# Patient Record
Sex: Female | Born: 1935
Health system: Southern US, Community
[De-identification: ages and names within clinical notes are randomized; demographics above are authoritative.]

## PROBLEM LIST (undated history)

## (undated) DIAGNOSIS — M8580 Other specified disorders of bone density and structure, unspecified site: Secondary | ICD-10-CM

## (undated) DIAGNOSIS — I10 Essential (primary) hypertension: Secondary | ICD-10-CM

## (undated) DIAGNOSIS — C801 Malignant (primary) neoplasm, unspecified: Secondary | ICD-10-CM

## (undated) DIAGNOSIS — C189 Malignant neoplasm of colon, unspecified: Secondary | ICD-10-CM

## (undated) DIAGNOSIS — H3530 Unspecified macular degeneration: Secondary | ICD-10-CM

## (undated) DIAGNOSIS — E785 Hyperlipidemia, unspecified: Secondary | ICD-10-CM

## (undated) DIAGNOSIS — Z803 Family history of malignant neoplasm of breast: Secondary | ICD-10-CM

## (undated) DIAGNOSIS — D649 Anemia, unspecified: Secondary | ICD-10-CM

## (undated) DIAGNOSIS — Z800 Family history of malignant neoplasm of digestive organs: Secondary | ICD-10-CM

## (undated) DIAGNOSIS — Z87442 Personal history of urinary calculi: Secondary | ICD-10-CM

## (undated) DIAGNOSIS — Z8049 Family history of malignant neoplasm of other genital organs: Secondary | ICD-10-CM

## (undated) DIAGNOSIS — K5790 Diverticulosis of intestine, part unspecified, without perforation or abscess without bleeding: Secondary | ICD-10-CM

## (undated) DIAGNOSIS — K219 Gastro-esophageal reflux disease without esophagitis: Secondary | ICD-10-CM

## (undated) DIAGNOSIS — Z5189 Encounter for other specified aftercare: Secondary | ICD-10-CM

## (undated) HISTORY — DX: Malignant neoplasm of colon, unspecified: C18.9

## (undated) HISTORY — DX: Diverticulosis of intestine, part unspecified, without perforation or abscess without bleeding: K57.90

## (undated) HISTORY — DX: Hyperlipidemia, unspecified: E78.5

## (undated) HISTORY — DX: Encounter for other specified aftercare: Z51.89

## (undated) HISTORY — DX: Gastro-esophageal reflux disease without esophagitis: K21.9

## (undated) HISTORY — PX: CHOLECYSTECTOMY: SHX55

## (undated) HISTORY — DX: Other specified disorders of bone density and structure, unspecified site: M85.80

## (undated) HISTORY — DX: Family history of malignant neoplasm of other genital organs: Z80.49

## (undated) HISTORY — DX: Family history of malignant neoplasm of breast: Z80.3

## (undated) HISTORY — DX: Family history of malignant neoplasm of digestive organs: Z80.0

## (undated) HISTORY — DX: Unspecified macular degeneration: H35.30

## (undated) HISTORY — PX: ABDOMINAL HYSTERECTOMY: SHX81

## (undated) HISTORY — DX: Essential (primary) hypertension: I10

---

## 1969-03-10 HISTORY — PX: BREAST EXCISIONAL BIOPSY: SUR124

## 1969-03-10 HISTORY — PX: OTHER SURGICAL HISTORY: SHX169

## 1991-09-14 HISTORY — PX: OTHER SURGICAL HISTORY: SHX169

## 2001-09-07 ENCOUNTER — Encounter: Admission: RE | Admit: 2001-09-07 | Discharge: 2001-09-07 | Payer: Self-pay | Admitting: Family Medicine

## 2001-09-07 ENCOUNTER — Encounter: Payer: Self-pay | Admitting: Family Medicine

## 2002-03-08 ENCOUNTER — Encounter: Payer: Self-pay | Admitting: Family Medicine

## 2002-03-08 ENCOUNTER — Encounter: Admission: RE | Admit: 2002-03-08 | Discharge: 2002-03-08 | Payer: Self-pay | Admitting: Family Medicine

## 2002-09-23 ENCOUNTER — Encounter: Payer: Self-pay | Admitting: Family Medicine

## 2002-09-23 ENCOUNTER — Encounter: Admission: RE | Admit: 2002-09-23 | Discharge: 2002-09-23 | Payer: Self-pay | Admitting: Family Medicine

## 2003-09-25 ENCOUNTER — Encounter: Admission: RE | Admit: 2003-09-25 | Discharge: 2003-09-25 | Payer: Self-pay | Admitting: Family Medicine

## 2004-09-26 ENCOUNTER — Encounter: Admission: RE | Admit: 2004-09-26 | Discharge: 2004-09-26 | Payer: Self-pay | Admitting: Family Medicine

## 2005-09-30 ENCOUNTER — Encounter: Admission: RE | Admit: 2005-09-30 | Discharge: 2005-09-30 | Payer: Self-pay | Admitting: Family Medicine

## 2005-10-08 ENCOUNTER — Encounter (INDEPENDENT_AMBULATORY_CARE_PROVIDER_SITE_OTHER): Payer: Self-pay | Admitting: Specialist

## 2005-10-08 ENCOUNTER — Encounter: Admission: RE | Admit: 2005-10-08 | Discharge: 2005-10-08 | Payer: Self-pay | Admitting: Family Medicine

## 2005-11-12 ENCOUNTER — Encounter: Admission: RE | Admit: 2005-11-12 | Discharge: 2005-11-12 | Payer: Self-pay | Admitting: General Surgery

## 2005-11-14 ENCOUNTER — Encounter (INDEPENDENT_AMBULATORY_CARE_PROVIDER_SITE_OTHER): Payer: Self-pay | Admitting: Specialist

## 2005-11-14 ENCOUNTER — Encounter: Admission: RE | Admit: 2005-11-14 | Discharge: 2005-11-14 | Payer: Self-pay | Admitting: General Surgery

## 2005-11-14 ENCOUNTER — Ambulatory Visit (HOSPITAL_BASED_OUTPATIENT_CLINIC_OR_DEPARTMENT_OTHER): Admission: RE | Admit: 2005-11-14 | Discharge: 2005-11-14 | Payer: Self-pay | Admitting: General Surgery

## 2006-10-02 ENCOUNTER — Encounter: Admission: RE | Admit: 2006-10-02 | Discharge: 2006-10-02 | Payer: Self-pay | Admitting: Physician Assistant

## 2007-10-04 ENCOUNTER — Encounter: Admission: RE | Admit: 2007-10-04 | Discharge: 2007-10-04 | Payer: Self-pay | Admitting: Physician Assistant

## 2007-10-19 ENCOUNTER — Encounter: Admission: RE | Admit: 2007-10-19 | Discharge: 2007-10-19 | Payer: Self-pay | Admitting: Family Medicine

## 2008-04-24 ENCOUNTER — Encounter: Admission: RE | Admit: 2008-04-24 | Discharge: 2008-04-24 | Payer: Self-pay | Admitting: Family Medicine

## 2008-10-04 ENCOUNTER — Encounter: Admission: RE | Admit: 2008-10-04 | Discharge: 2008-10-04 | Payer: Self-pay | Admitting: Family Medicine

## 2009-06-13 ENCOUNTER — Ambulatory Visit: Payer: Self-pay | Admitting: Vascular Surgery

## 2009-09-14 ENCOUNTER — Ambulatory Visit: Payer: Self-pay | Admitting: Vascular Surgery

## 2009-10-15 ENCOUNTER — Encounter
Admission: RE | Admit: 2009-10-15 | Discharge: 2009-10-15 | Payer: Self-pay | Source: Home / Self Care | Admitting: Family Medicine

## 2009-10-17 ENCOUNTER — Ambulatory Visit: Payer: Self-pay | Admitting: Vascular Surgery

## 2009-10-17 HISTORY — PX: OTHER SURGICAL HISTORY: SHX169

## 2009-10-24 ENCOUNTER — Ambulatory Visit: Payer: Self-pay | Admitting: Vascular Surgery

## 2009-12-11 ENCOUNTER — Ambulatory Visit: Payer: Self-pay | Admitting: Vascular Surgery

## 2010-01-29 ENCOUNTER — Ambulatory Visit: Payer: Self-pay | Admitting: Vascular Surgery

## 2010-02-20 ENCOUNTER — Ambulatory Visit: Payer: Self-pay | Admitting: Vascular Surgery

## 2010-03-02 ENCOUNTER — Emergency Department (HOSPITAL_COMMUNITY)
Admission: EM | Admit: 2010-03-02 | Discharge: 2010-03-02 | Payer: Self-pay | Source: Home / Self Care | Admitting: Emergency Medicine

## 2010-03-13 ENCOUNTER — Ambulatory Visit
Admission: RE | Admit: 2010-03-13 | Discharge: 2010-03-13 | Payer: Self-pay | Source: Home / Self Care | Attending: Vascular Surgery | Admitting: Vascular Surgery

## 2010-05-20 LAB — COMPREHENSIVE METABOLIC PANEL
Calcium: 9 mg/dL (ref 8.4–10.5)
Chloride: 107 mEq/L (ref 96–112)
Creatinine, Ser: 0.62 mg/dL (ref 0.4–1.2)
GFR calc Af Amer: >60 mL/min (ref >60)
Glucose, Bld: 107 mg/dL — ABNORMAL HIGH (ref 70–99)
Potassium: 3.4 mEq/L — ABNORMAL LOW (ref 3.5–5.1)
Sodium: 140 mEq/L (ref 135–145)

## 2010-05-20 LAB — POCT CARDIAC MARKERS
CKMB, poc: 1.9 ng/mL (ref 1.0–8.0)
Myoglobin, poc: 69.1 ng/mL (ref 12–200)
Troponin i, poc: <0.05 ng/mL (ref 0.00–0.09)

## 2010-05-20 LAB — LIPASE, BLOOD: Lipase: 36 U/L (ref 11–59)

## 2010-05-20 LAB — DIFFERENTIAL
Basophils Absolute: 0 10*3/uL (ref 0.0–0.1)
Eosinophils Relative: 1 % (ref 0–5)
Neutro Abs: 8.8 10*3/uL — ABNORMAL HIGH (ref 1.7–7.7)

## 2010-05-20 LAB — CBC
Hemoglobin: 12.6 g/dL (ref 12.0–15.0)
MCH: 29.5 pg (ref 26.0–34.0)
MCHC: 34.1 g/dL (ref 30.0–36.0)
RBC: 4.27 MIL/uL (ref 3.87–5.11)

## 2010-07-15 ENCOUNTER — Encounter: Payer: Self-pay | Admitting: Nurse Practitioner

## 2010-07-15 DIAGNOSIS — E785 Hyperlipidemia, unspecified: Secondary | ICD-10-CM

## 2010-07-15 DIAGNOSIS — K5790 Diverticulosis of intestine, part unspecified, without perforation or abscess without bleeding: Secondary | ICD-10-CM | POA: Insufficient documentation

## 2010-07-15 DIAGNOSIS — K219 Gastro-esophageal reflux disease without esophagitis: Secondary | ICD-10-CM | POA: Insufficient documentation

## 2010-07-15 DIAGNOSIS — M8580 Other specified disorders of bone density and structure, unspecified site: Secondary | ICD-10-CM | POA: Insufficient documentation

## 2010-07-15 DIAGNOSIS — I10 Essential (primary) hypertension: Secondary | ICD-10-CM

## 2010-07-23 NOTE — Procedures (Signed)
DUPLEX DEEP VENOUS EXAM - LOWER EXTREMITY   INDICATION:  One week followup ELAS of right greater saphenous vein and  anterior saphenous branch.   HISTORY:  Edema:  No .  Trauma/Surgery:  One week followup ELAS on 10/17/2009.  Pain:  No.  PE:  No.  Previous DVT:  No.  Anticoagulants:  Aspirin.  Other:   DUPLEX EXAM:                CFV   SFV   PopV  PTV    GSV - Anterior Branch                R  L  R  L  R  L  R   L  R   L  Thrombosis    0  0  0     0     0      +  Spontaneous   +  +  +     +     +      0  Phasic        +  +  +     +     +      0  Augmentation  +  +  +     +     +      0  Compressible  +  +  +     +     +      0  Competent     +  +  +     +     +      0   Legend:  + - yes  o - no  p - partial  D - decreased   IMPRESSION:  1. Right leg appears to be negative for deep venous thrombosis.  2. Right anterior saphenous branch ELAS (endovenous laser ablation      saphenous) appears to be thrombosed.           _____________________________  Larina Earthly, M.D.   NT/MEDQ  D:  10/24/2009  T:  10/24/2009  Job:  [id number]

## 2010-07-23 NOTE — Assessment & Plan Note (Signed)
OFFICE VISIT   Ashley Peters, BAGBY  DOB:  1928-12-01                                       09/14/2009  WUJWJ#:[id number]   The patient presents today for continued evaluation of her right leg  venous varicosities.  She has extensive large varicosities, extending  over the anterior thigh down onto her calf.  She has worn graduated  compression garments since my first visit with her in April.  She  reports these have been very difficult for her to wear and have given  her no symptom relief since that time.  She reports that cooking and  house cleaning are difficult due to leg pain with prolonged standing and  also any yard work requiring prolonged standing is also difficult due to  leg pain.   On re-imaging, she does have anterior branch of her great saphenous vein  that extends from her groin down to the proximal thigh where the large  varicosities began after the anterior branch penetrates superficially  through the fascia.  She does have palpable dorsalis pedis pulse  bilaterally.   I discussed options with this patient and her daughter present.  I have  recommended laser ablation of this anterior branch of her saphenous vein  from her proximal thigh to the saphenofemoral junction to reduce the  inflow into these large varicosities.  I suspect that she will in all  likelihood require stab phlebectomy of the tributary varicosities as a  staged second procedure.  She understands the procedure and wishes to  proceed at that her earliest convenience.     Larina Earthly, M.D.  Electronically Signed   TFE/MEDQ  D:  09/14/2009  T:  09/17/2009  Job:  [id number]

## 2010-07-23 NOTE — Assessment & Plan Note (Signed)
OFFICE VISIT   Ashley Peters, Ashley Peters  DOB:  1928/07/31                                       03/13/2010  ZOXWR#:[id number]   Ashley Peters presents today for followup after her stab phlebectomy of  multiple tributary varicosities over her right anterior thigh and  pretibial area.  This was done on 12/14 as an outpatient in our office.  She had excellent healing of this and no complications related to it.  I  am quite pleased with her result as is [name].  She is to see [place]  again on an as needed basis.     Larina Earthly, M.D.  Electronically Signed   TFE/MEDQ  D:  03/13/2010  T:  03/14/2010  Job:  [id number]

## 2010-07-23 NOTE — Assessment & Plan Note (Signed)
OFFICE VISIT   RENU, ASBY  DOB:  1928/06/30                                       02/20/2010  [id number]#:[id number]   The patient presents today for stab phlebectomy of greater than 20 sites  of her right tributary varicosities in her thigh and calf.  She had  undergone an ablation of her anterior saphenous branch feeding into  these and is here for a staged thrombectomy due to persistent pain.  This was done under tumescent anesthesia in our office with no immediate  complications.  She will return in 2 weeks for a postop follow-up.     Larina Earthly, M.D.  Electronically Signed   TFE/MEDQ  D:  02/20/2010  T:  02/21/2010  Job:  [id number]

## 2010-07-23 NOTE — Consult Note (Signed)
NEW PATIENT CONSULTATION   Ashley Peters, Ashley Peters  DOB:  1928-12-29                                       06/13/2009  WGNFA#:[id number]   The patient presents today for evaluation of right leg venous pathology.  She is a very pleasant 77 year old white female with progressively  severe varicosities over her right anterior thigh.  She reports this has  been present for many years but more engorged and more painful over the  past several years.  She denies any swelling in her lower extremities.  She has not had any bleeding from these and has not had any superficial  thrombophlebitis.  She denies any history of deep venous thrombosis.   PAST MEDICAL HISTORY:  Negative for diabetes.  She does have history of  hypertension.  She had a hysterectomy in 1972, cholecystectomy in 1984.  No history of cancer.   FAMILY HISTORY:  Is negative for premature atherosclerotic disease.   SOCIAL HISTORY:  She is widowed with three children.  She does not smoke  or drink alcohol on a regular basis.   REVIEW OF SYSTEMS:  No weight loss or weight gain.  Weight is reported  at 140 pounds.  She is 5 feet tall.  Cardiac, pulmonary, GI and GU negative.  VASCULAR:  Positive for pain with standing and with walking over the  varicosities.  NEUROLOGIC:  No dizziness or blackouts.  MUSCULOSKELETAL:  There is no arthritic joint pain.  PSYCHIATRIC: Negative.  HEENT, hematologic and skin negative.   PHYSICAL EXAMINATION:  General:  A well-developed, well-nourished white  female appearing stated of 80 in no acute stress.  Vital signs:  Blood  pressure is 115/68, pulse 70, respirations 16.  HEENT:  Normal.  Musculoskeletal:  Shows no major deformities or cyanosis.  Neurological:  No focal weakness or paresthesias.  Skin:  Without ulcers or rashes.  She does have large varicosities over her proximal thigh extending  directly anterior down to the level of her knee.  She has small  varicosities over  her left lateral knee.  She does not have any changes  of venous hypertension distally.  She has 2+ dorsalis pedis pulses  bilaterally.   She underwent noninvasive vascular laboratory studies in our office that  showed no reflux in her great saphenous vein and no reflux in her left  deep system.  She has some mild reflux at her common femoral vein on the  right.  She has no evidence of DVT.  I had a long discussion with the  patient and her family present.  I explained that the etiology of her  pain is related to this large varix with venous distention.  I explained  that typically we would encounter this in relationship to a refluxing  saphenous vein but in her case this does not appear to be the case.  There does not appear to be any communication between the saphenous vein  and this large tributary varicosity.  I have recommend that we begin  treating her with continued elevation and compression and also  instructed her on anti-inflammatory use with ibuprofen for discomfort.  She is fitted today for thigh-high graduated compression stockings 20-30  mmHg.  I plan to see her again in 3 months for continued discussion.  I  did explain the option of stab phlebectomy and we will discuss  this  further at the next visit in 3 months.     Larina Earthly, M.D.  Electronically Signed   TFE/MEDQ  D:  06/13/2009  T:  06/14/2009  Job:  [id number]

## 2010-07-23 NOTE — Assessment & Plan Note (Signed)
OFFICE VISIT   Ashley Peters, Ashley Peters  DOB:  07-19-1928                                       12/11/2009  ZOXWR#:[id number]   The patient presents today for continued followup of her right leg  venous hypertension.  She had an anterior branch of the saphenous vein  that was feeding into large varicosities over her anterior thigh and  knee area.  She underwent successful ablation of this anterior branch  approximately 6 weeks ago.  She is here for continued followup.  She has  no difficulty related to the ablation and on SonoSite ultrasound today  she does have ablation of her anterior branch of her saphenous vein.  She continues to have distention of these tributary branches in her  thigh and below.  I have recommended that we continue observation but  feel that she is not likely going to require a staged phlebectomy of  these areas for improvement.  These are too large for sclerotherapy.  We  will see her again in 6 weeks for continued evaluation of this.  At that  time, she will have had a 32-month trial and if she continues to have  symptoms that she is currently having, we would recommend stab  phlebectomy as an outpatient procedure.     Larina Earthly, M.D.  Electronically Signed   TFE/MEDQ  D:  12/11/2009  T:  12/12/2009  Job:  [id number]   cc:   Ernestina Penna, M.D.

## 2010-07-23 NOTE — Assessment & Plan Note (Signed)
OFFICE VISIT   TERRACE, CHIEM  DOB:  1929-01-13                                       10/24/2009  NFAOZ#:[id number]   The patient presents today for followup of her right great saphenous  vein laser ablation 1 week ago.  She has minimal difficulty with any  discomfort following the procedure with mild bruising.   On physical exam, she has noted tenderness over the ablation site.  The  varices that were below this anterior branch are still prominent.   She underwent venous duplex today in our office and this reveals closure  of her saphenous anterior branch as planned with the ablation.  She does  not have any evidence of DVT.  She will continue to wear her compression  garments on a p.r.n. basis following one additional week after the  procedure.  We will see her back in 6 weeks to determine if this is an  adequate treatment by decompressing the tributary vessels below the  ablation site.  She understands that she may require a separate staged  procedure for phlebectomy of these varicosities if she is continuing to  have discomfort.     Larina Earthly, M.D.  Electronically Signed   TFE/MEDQ  D:  10/24/2009  T:  10/25/2009  Job:  [id number]   cc:   Ernestina Penna, M.D.

## 2010-07-23 NOTE — Procedures (Signed)
LOWER EXTREMITY VENOUS REFLUX EXAM   INDICATION:  Right leg varicose veins, painful and raised.   EXAM:  Using color-flow imaging and pulse Doppler spectral analysis, the  right common femoral, superficial femoral, popliteal, posterior tibial,  greater and lesser saphenous veins are evaluated.  There is evidence  suggesting deep venous insufficiency in the common femoral vein of the  right lower extremity.   The right saphenofemoral junction is competent. The right GSV is  competent.   The right proximal short saphenous vein demonstrates competency.   GSV Diameter (used if found to be incompetent only)                                               Right    Left  Proximal Greater Saphenous Vein              cm       cm  Proximal-to-mid-thigh                        cm       cm  Mid thigh                                    cm       cm  Mid-distal thigh                             cm       cm  Distal thigh                                 cm       cm  Knee                                         cm       cm   IMPRESSION:  1. Right greater saphenous vein has no reflux.  2. Right greater saphenous vein is not aneurysmal.  3. The right greater saphenous vein is not tortuous.  4. The deep venous system is not competent in the CFV.  5. The anterolateral tributary is not competent with reflux of >500      milliseconds with a caliber of 0.7 to 1.0 cm.  The anterolateral      tributary is tortuous.   ___________________________________________  Larina Earthly, M.D.   NT/MEDQ  D:  06/13/2009  T:  06/13/2009  Job:  [id number]

## 2010-07-23 NOTE — Assessment & Plan Note (Signed)
OFFICE VISIT   Ashley Peters, Ashley Peters  DOB:  December 06, 1928                                       10/17/2009  UVOZD#:[id number]   Patient underwent ablation of her right great saphenous vein anterior  branch in our office today under local anesthesia.  She had a segment of  her anterior branch that is feeding large varicosities in her anterior  and lateral right thigh.  She did have an angled segment in the  midportion of this; therefore, had to have 2 different puncture sites  with 2 different laser fibers to treat her anterior saphenous branch.   She tolerated this quite well and was discharged after applying her  compression dressing.  We will see her again in 1 week for repeat  duplex.     Larina Earthly, M.D.  Electronically Signed   TFE/MEDQ  D:  10/17/2009  T:  10/17/2009  Job:  [id number]   cc:   Ernestina Penna, M.D.

## 2010-07-23 NOTE — Assessment & Plan Note (Signed)
OFFICE VISIT   ISYS, TIETJE  DOB:  [birth year]                                       01/29/2010  NWGNF#:[id number]   The patient presents today for continued discussion regarding her right  leg venous pathology.  She did undergo laser ablation of an anterior  accessory branch of her saphenous vein  on 10/17/2009.  She reports that  she has had some improvement but continues to have pain over the large  varicosities on her anterior thigh extending down over her anterior  knee.  She reports that  prolonged standing, especially cooking,  cleaning, shopping are difficult due to leg pain and also frequently  awakened at night with leg pain over this area as well.  She states it  is somewhat better but continues to have difficulty despite elevation  and using her compression garment.   I have recommend we proceed with stab phlebectomy of her tributary  varicosities for relief of her pain symptoms.  She wishes to proceed  with this as we can get this on the schedule.  She understands this as  an outpatient in our office.     Larina Earthly, M.D.  Electronically Signed   TFE/MEDQ  D:  01/29/2010  T:  01/30/2010  Job:  [id number]

## 2010-07-26 NOTE — Op Note (Signed)
NAMEMARYLEE, Ashley Peters NO.:  [id number]   MEDICAL RECORD NO.:  [id number]          PATIENT TYPE:  AMB   LOCATION:  [facility]SC                          FACILITY:  [facility]   PHYSICIAN:  Angelia Mould. Derrell Lolling, M.D.DATE OF BIRTH:  September 14, 1928   DATE OF PROCEDURE:  11/14/2005  DATE OF DISCHARGE:                                 OPERATIVE REPORT   PREOPERATIVE DIAGNOSIS:  Complex sclerosing papilloma, left breast.   POSTOPERATIVE DIAGNOSIS:  Complex sclerosing papilloma, left breast, final  pathology pending.   OPERATION PERFORMED:  Excisional biopsy left breast mass with needle  localization specimen mammogram.   SURGEON:  Dr. Claud Kelp.   OPERATIVE INDICATIONS:  This is a 77 year old white female who has had  several breast biopsies in the past for benign disease.  Recent mammogram  showed an irregular somewhat calcified mass at the 9 o'clock position of the  left breast 4 cm from the nipple.  A biopsy was performed percutaneously  which showed a complex sclerosing lesion consistent with a sclerosing  intraductal papilloma.  The pathologist felt that this should be excised to  rule out carcinoma.  Her mammograms otherwise show scattered calcifications  that looked fairly globular and benign.  I do not feel a mass in the breast  although there are some ecchymoses from the biopsy.  She is brought to the  operating room electively.   OPERATIVE TECHNIQUE:  The patient underwent needle localization of the area  in question by Dr. Winferd Humphrey this morning.  The wire goes behind the  calcifications and the marker behind the nipple and areolar complex and  slightly medially.  The patient was then taken to the operating room where a  general LMA anesthetic was used.  The left breast was prepped and draped in  a sterile fashion.  0.5% Marcaine with epinephrine was used as a local  infiltration anesthetic.  The wire insertion was at the 9 o'clock position  several centimeters medial  to the areola. A transverse incision was made.  Dissection was carried down into the breast tissue around the localizing  wire.  The specimen was marked with silk sutures to orient the pathologist.  The specimen was removed and x-rayed.  Dr. Adriana Reams called back and said  that I had completely removed the specimen.  The wound was irrigated with  saline.  Hemostasis was excellent and achieved with electrocautery.  The  deep breast tissues were closed with interrupted sutures of 3-0 Vicryl and  the skin closed with running subcuticular suture of 4-0 Monocryl and Steri-  Strips.  Clean bandages were placed and the patient was taken to the  recovery room in stable condition.  Estimated blood loss was about 15 mL.  Complications none. Sponge, needle and instrument counts were correct.      Angelia Mould. Derrell Lolling, M.D.  Electronically Signed    HMI/MEDQ  D:  11/14/2005  T:  11/14/2005  Job:  [id number]   cc:   Norva Pavlov, M.D.  Lindaann Pascal, MD

## 2010-09-17 ENCOUNTER — Other Ambulatory Visit: Payer: Self-pay | Admitting: Family Medicine

## 2010-09-17 DIAGNOSIS — Z1231 Encounter for screening mammogram for malignant neoplasm of breast: Secondary | ICD-10-CM

## 2010-10-17 ENCOUNTER — Ambulatory Visit
Admission: RE | Admit: 2010-10-17 | Discharge: 2010-10-17 | Disposition: A | Discharge status: Home / Self Care | Payer: Medicare Other | Source: Ambulatory Visit | Attending: Family Medicine | Admitting: Family Medicine

## 2010-10-17 DIAGNOSIS — Z1231 Encounter for screening mammogram for malignant neoplasm of breast: Secondary | ICD-10-CM

## 2011-06-19 DIAGNOSIS — Z961 Presence of intraocular lens: Secondary | ICD-10-CM | POA: Diagnosis not present

## 2011-06-19 DIAGNOSIS — H3530 Unspecified macular degeneration: Secondary | ICD-10-CM | POA: Diagnosis not present

## 2011-06-19 DIAGNOSIS — H1045 Other chronic allergic conjunctivitis: Secondary | ICD-10-CM | POA: Diagnosis not present

## 2011-10-06 ENCOUNTER — Other Ambulatory Visit: Payer: Self-pay | Admitting: Family Medicine

## 2011-10-06 DIAGNOSIS — Z1231 Encounter for screening mammogram for malignant neoplasm of breast: Secondary | ICD-10-CM

## 2011-10-23 ENCOUNTER — Ambulatory Visit: Payer: Medicare Other

## 2011-11-05 ENCOUNTER — Ambulatory Visit
Admission: RE | Admit: 2011-11-05 | Discharge: 2011-11-05 | Disposition: A | Discharge status: Home / Self Care | Payer: Medicare Other | Source: Ambulatory Visit | Attending: Family Medicine | Admitting: Family Medicine

## 2011-11-05 DIAGNOSIS — Z1231 Encounter for screening mammogram for malignant neoplasm of breast: Secondary | ICD-10-CM

## 2011-11-06 DIAGNOSIS — R21 Rash and other nonspecific skin eruption: Secondary | ICD-10-CM | POA: Diagnosis not present

## 2011-12-02 ENCOUNTER — Encounter: Payer: Self-pay | Admitting: Gastroenterology

## 2011-12-10 DIAGNOSIS — Z23 Encounter for immunization: Secondary | ICD-10-CM | POA: Diagnosis not present

## 2012-03-29 DIAGNOSIS — E785 Hyperlipidemia, unspecified: Secondary | ICD-10-CM | POA: Diagnosis not present

## 2012-03-29 DIAGNOSIS — R5381 Other malaise: Secondary | ICD-10-CM | POA: Diagnosis not present

## 2012-03-29 DIAGNOSIS — I10 Essential (primary) hypertension: Secondary | ICD-10-CM | POA: Diagnosis not present

## 2012-03-29 DIAGNOSIS — M899 Disorder of bone, unspecified: Secondary | ICD-10-CM | POA: Diagnosis not present

## 2012-03-29 DIAGNOSIS — R5383 Other fatigue: Secondary | ICD-10-CM | POA: Diagnosis not present

## 2012-03-29 DIAGNOSIS — E559 Vitamin D deficiency, unspecified: Secondary | ICD-10-CM | POA: Diagnosis not present

## 2012-03-31 ENCOUNTER — Other Ambulatory Visit: Payer: Self-pay | Admitting: Family Medicine

## 2012-03-31 ENCOUNTER — Ambulatory Visit (HOSPITAL_COMMUNITY)
Admission: RE | Admit: 2012-03-31 | Discharge: 2012-03-31 | Disposition: A | Discharge status: Home / Self Care | Payer: Medicare Other | Source: Ambulatory Visit | Attending: Family Medicine | Admitting: Family Medicine

## 2012-03-31 DIAGNOSIS — M79605 Pain in left leg: Secondary | ICD-10-CM

## 2012-03-31 DIAGNOSIS — M7989 Other specified soft tissue disorders: Secondary | ICD-10-CM | POA: Insufficient documentation

## 2012-03-31 DIAGNOSIS — M79609 Pain in unspecified limb: Secondary | ICD-10-CM | POA: Diagnosis not present

## 2012-06-22 DIAGNOSIS — H3530 Unspecified macular degeneration: Secondary | ICD-10-CM | POA: Diagnosis not present

## 2012-06-22 DIAGNOSIS — Z961 Presence of intraocular lens: Secondary | ICD-10-CM | POA: Diagnosis not present

## 2012-06-22 DIAGNOSIS — H1045 Other chronic allergic conjunctivitis: Secondary | ICD-10-CM | POA: Diagnosis not present

## 2012-06-28 ENCOUNTER — Ambulatory Visit (INDEPENDENT_AMBULATORY_CARE_PROVIDER_SITE_OTHER): Payer: Medicare Other | Admitting: Nurse Practitioner

## 2012-06-28 ENCOUNTER — Encounter: Payer: Self-pay | Admitting: *Deleted

## 2012-06-28 ENCOUNTER — Encounter: Payer: Self-pay | Admitting: Nurse Practitioner

## 2012-06-28 VITALS — BP 125/61 | HR 70 | Temp 97.8°F | Ht 59.5 in | Wt 149.5 lb

## 2012-06-28 DIAGNOSIS — M899 Disorder of bone, unspecified: Secondary | ICD-10-CM | POA: Diagnosis not present

## 2012-06-28 DIAGNOSIS — H26499 Other secondary cataract, unspecified eye: Secondary | ICD-10-CM | POA: Diagnosis not present

## 2012-06-28 DIAGNOSIS — E785 Hyperlipidemia, unspecified: Secondary | ICD-10-CM | POA: Diagnosis not present

## 2012-06-28 DIAGNOSIS — M8580 Other specified disorders of bone density and structure, unspecified site: Secondary | ICD-10-CM

## 2012-06-28 DIAGNOSIS — I10 Essential (primary) hypertension: Secondary | ICD-10-CM | POA: Diagnosis not present

## 2012-06-28 DIAGNOSIS — M949 Disorder of cartilage, unspecified: Secondary | ICD-10-CM | POA: Diagnosis not present

## 2012-06-28 LAB — COMPLETE METABOLIC PANEL WITH GFR
ALT: 14 U/L (ref 0–35)
Albumin: 4.3 g/dL (ref 3.5–5.2)
Alkaline Phosphatase: 28 U/L — ABNORMAL LOW (ref 39–117)
CO2: 28 mEq/L (ref 19–32)
GFR, Est Non African American: 64 mL/min
Glucose, Bld: 89 mg/dL (ref 70–99)
Potassium: 4 mEq/L (ref 3.5–5.3)
Sodium: 137 mEq/L (ref 135–145)
Total Bilirubin: 0.7 mg/dL (ref 0.3–1.2)
Total Protein: 6.5 g/dL (ref 6.0–8.3)

## 2012-06-28 NOTE — Patient Instructions (Signed)

## 2012-06-28 NOTE — Progress Notes (Signed)
Subjective:     Ashley Peters is a 79 y.o. female.  Chief Complaint: Dyslipidemia Patient presents for evaluation of lipids. Compliance with treatment thus far has been good. A repeat fasting lipid profile was done. The patient does not use medications that may worsen dyslipidemias (corticosteroids, progestins, anabolic steroids, diuretics, beta-blockers, amiodarone, cyclosporine, olanzapine). The patient exercises rarely.  The patient is known to have coexisting coronary artery disease.   Cardiac Risk Factors Age > 45-female, > 55-female:  YES  +1  Smoking:   NO  Sig. family hx of CHD*:  NO  Hypertension:   YES  +1  Diabetes:   NO  HDL < 35:   NO  HDL > 59:   NO  Total: 2  *- Significant family history of Coronary Heart Disease per National Cholesterol Education Program = Myocardial Infarction or sudden death at less than 56 years old in  father or other 1st-degree female relative, or less than 13 years old in mother or  other 1st-degree female relative.  Hypertension Patient is here for evaluation of elevated blood pressures. Age at onset of elevated blood pressure:  44. Cardiac symptoms: none. Patient denies chest pain, dyspnea, fatigue, irregular heart beat and orthopnea. Cardiovascular risk factors: advanced age (older than 9 for men, 49 for women), dyslipidemia and sedentary lifestyle. Use of agents associated with hypertension: none. History of target organ damage: none. GERD Paitent complains of heartburn. This has been associated with no other symptoms.  She denies belching, choking on food, cough, dysphagia, hoarseness, laryngitis and need to clear throat frequently. Symptoms have been present for 5 years. She denies dysphagia. She has not lost weight. She denies melena, hematochezia, hematemesis, and coffee ground emesis. Medical therapy in the past has included none. Osteopenia Patient due for Dexa scan  The following portions of the patient's history were reviewed and updated  as appropriate: allergies, current medications, past family history, past medical history, past social history, past surgical history and problem list.  Review of Systems A comprehensive review of systems was negative.    Objective:   BP 125/61  Pulse 70  Temp(Src) 97.8 F (36.6 C) (Oral)  Ht 4' 11.5" (1.511 m)  Wt 149 lb 8 oz (67.813 kg)  BMI 29.7 kg/m2  General Appearance:    Alert, cooperative, no distress, appears stated age  Head:    Normocephalic, without obvious abnormality, atraumatic  Eyes:    PERRL, conjunctiva/corneas clear, EOM's intact, fundi    benign, both eyes  Ears:    Normal TM's and external ear canals, both ears  Nose:   Nares normal, septum midline, mucosa normal, no drainage    or sinus tenderness  Throat:   Lips, mucosa, and tongue normal; teeth and gums normal  Neck:   Supple, symmetrical, trachea midline, no adenopathy;    thyroid:  no enlargement/tenderness/nodules; no carotid   bruit or JVD  Back:     Symmetric, no curvature, ROM normal, no CVA tenderness  Lungs:     Clear to auscultation bilaterally, respirations unlabored  Chest Wall:    No tenderness or deformity   Heart:    Regular rate and rhythm, S1 and S2 normal, no murmur, rub   or gallop  Breast Exam:    No tenderness, masses, or nipple abnormality  Abdomen:     Soft, non-tender, bowel sounds active all four quadrants,    no masses, no organomegaly  Genitalia:    Normal female without lesion, discharge or tenderness  Rectal:  Normal tone, normal prostate, no masses or tenderness;   guaiac negative stool  Extremities:   Extremities normal, atraumatic, no cyanosis or edema  Pulses:   2+ and symmetric all extremities  Skin:   Skin color, texture, turgor normal, no rashes or lesions  Lymph nodes:   Cervical, supraclavicular, and axillary nodes normal  Neurologic:   CNII-XII intact, normal strength, sensation and reflexes    throughout      Assessment:     ICD-9-CM   1. Essential  hypertension, benign 401.1 COMPLETE METABOLIC PANEL WITH GFR  2. Other and unspecified hyperlipidemia 272.4 NMR Lipoprofile with Lipids  3. Osteopenia 733.90 DG Bone Density     Plan:     1. Essential hypertension, benign Low Na + diet - COMPLETE METABOLIC PANEL WITH GFR  2. Other and unspecified hyperlipidemia Low fat diet - NMR Lipoprofile with Lipids  3. Osteopenia Weight bearing exercise encouraged - DG Bone Density; Future  Mary-Margaret Daphine Deutscher, FNP

## 2012-06-29 LAB — NMR LIPOPROFILE WITH LIPIDS
HDL Particle Number: 48.8 umol/L (ref >=30.5)
HDL Size: 8.5 nm — ABNORMAL LOW (ref >=9.2)
HDL-C: 57 mg/dL (ref >=40)
Large HDL-P: 2.6 umol/L — ABNORMAL LOW (ref >=4.8)
Large VLDL-P: 3 nmol/L — ABNORMAL HIGH (ref <=2.7)
Triglycerides: 90 mg/dL (ref <150)

## 2012-09-01 ENCOUNTER — Encounter: Payer: Self-pay | Admitting: Gastroenterology

## 2012-09-01 ENCOUNTER — Ambulatory Visit: Payer: Medicare Other

## 2012-09-01 ENCOUNTER — Other Ambulatory Visit: Payer: Medicare Other

## 2012-10-15 ENCOUNTER — Other Ambulatory Visit: Payer: Self-pay

## 2012-10-15 DIAGNOSIS — Z1231 Encounter for screening mammogram for malignant neoplasm of breast: Secondary | ICD-10-CM

## 2012-11-09 ENCOUNTER — Ambulatory Visit: Payer: Medicare Other

## 2012-11-10 ENCOUNTER — Ambulatory Visit: Payer: Medicare Other

## 2012-11-15 ENCOUNTER — Ambulatory Visit: Payer: Medicare Other

## 2012-11-15 ENCOUNTER — Ambulatory Visit
Admission: RE | Admit: 2012-11-15 | Discharge: 2012-11-15 | Disposition: A | Discharge status: Home / Self Care | Payer: Medicare Other | Source: Ambulatory Visit

## 2012-11-15 DIAGNOSIS — Z1231 Encounter for screening mammogram for malignant neoplasm of breast: Secondary | ICD-10-CM | POA: Diagnosis not present

## 2012-12-15 ENCOUNTER — Ambulatory Visit (INDEPENDENT_AMBULATORY_CARE_PROVIDER_SITE_OTHER): Payer: Medicare Other

## 2012-12-15 DIAGNOSIS — Z23 Encounter for immunization: Secondary | ICD-10-CM | POA: Diagnosis not present

## 2012-12-27 ENCOUNTER — Encounter: Payer: Self-pay | Admitting: Cardiology

## 2013-01-03 ENCOUNTER — Encounter: Payer: Self-pay | Admitting: Family Medicine

## 2013-01-03 ENCOUNTER — Ambulatory Visit (INDEPENDENT_AMBULATORY_CARE_PROVIDER_SITE_OTHER): Payer: Medicare Other | Admitting: Family Medicine

## 2013-01-03 VITALS — BP 116/68 | HR 69 | Temp 97.0°F | Ht 59.0 in | Wt 145.0 lb

## 2013-01-03 DIAGNOSIS — R52 Pain, unspecified: Secondary | ICD-10-CM

## 2013-01-03 DIAGNOSIS — R109 Unspecified abdominal pain: Secondary | ICD-10-CM

## 2013-01-03 DIAGNOSIS — N390 Urinary tract infection, site not specified: Secondary | ICD-10-CM

## 2013-01-03 LAB — POCT URINALYSIS DIPSTICK
Bilirubin, UA: NEGATIVE
Glucose, UA: NEGATIVE
Ketones, UA: NEGATIVE
Nitrite, UA: NEGATIVE
Protein, UA: 100
Spec Grav, UA: 1.02
Urobilinogen, UA: NEGATIVE
pH, UA: 6

## 2013-01-03 LAB — POCT UA - MICROSCOPIC ONLY
Casts, Ur, LPF, POC: NEGATIVE
Crystals, Ur, HPF, POC: NEGATIVE
Mucus, UA: NEGATIVE
Yeast, UA: NEGATIVE

## 2013-01-03 MED ORDER — CIPROFLOXACIN HCL 500 MG PO TABS: 500.0000 mg | ORAL_TABLET | Freq: Two times a day (BID) | ORAL | Status: DC

## 2013-01-03 NOTE — Patient Instructions (Signed)
Urinary Tract Infection  Urinary tract infections (UTIs) can develop anywhere along your urinary tract. Your urinary tract is your body's drainage system for removing wastes and extra water. Your urinary tract includes two kidneys, two ureters, a bladder, and a urethra. Your kidneys are a pair of bean-shaped organs. Each kidney is about the size of your fist. They are located below your ribs, one on each side of your spine.  CAUSES  Infections are caused by microbes, which are microscopic organisms, including fungi, viruses, and bacteria. These organisms are so small that they can only be seen through a microscope. Bacteria are the microbes that most commonly cause UTIs.  SYMPTOMS   Symptoms of UTIs may vary by age and gender of the patient and by the location of the infection. Symptoms in young women typically include a frequent and intense urge to urinate and a painful, burning feeling in the bladder or urethra during urination. Older women and men are more likely to be tired, shaky, and weak and have muscle aches and abdominal pain. A fever may mean the infection is in your kidneys. Other symptoms of a kidney infection include pain in your back or sides below the ribs, nausea, and vomiting.  DIAGNOSIS  To diagnose a UTI, your caregiver will ask you about your symptoms. Your caregiver also will ask to provide a urine sample. The urine sample will be tested for bacteria and white blood cells. White blood cells are made by your body to help fight infection.  TREATMENT   Typically, UTIs can be treated with medication. Because most UTIs are caused by a bacterial infection, they usually can be treated with the use of antibiotics. The choice of antibiotic and length of treatment depend on your symptoms and the type of bacteria causing your infection.  HOME CARE INSTRUCTIONS   If you were prescribed antibiotics, take them exactly as your caregiver instructs you. Finish the medication even if you feel better after you  have only taken some of the medication.   Drink enough water and fluids to keep your urine clear or pale yellow.   Avoid caffeine, tea, and carbonated beverages. They tend to irritate your bladder.   Empty your bladder often. Avoid holding urine for long periods of time.   Empty your bladder before and after sexual intercourse.   After a bowel movement, women should cleanse from front to back. Use each tissue only once.  SEEK MEDICAL CARE IF:    You have back pain.   You develop a fever.   Your symptoms do not begin to resolve within 3 days.  SEEK IMMEDIATE MEDICAL CARE IF:    You have severe back pain or lower abdominal pain.   You develop chills.   You have nausea or vomiting.   You have continued burning or discomfort with urination.  MAKE SURE YOU:    Understand these instructions.   Will watch your condition.   Will get help right away if you are not doing well or get worse.  Document Released: 12/04/2004 Document Revised: 08/26/2011 Document Reviewed: 04/04/2011  [employer] Patient Information 2014 [employer]

## 2013-01-03 NOTE — Progress Notes (Signed)
  Subjective:    Patient ID: Ashley Peters, female    DOB: October 01, 1928, 79 y.o.   MRN: [id number]  HPI This 79 y.o. female presents for evaluation of right back pain radiating to abdomen. She is having some dysuria and some back pain.   Review of Systems C/o abdominal pain   No chest pain, SOB, HA, dizziness, vision change, N/V, diarrhea, constipation, myalgias, arthralgias or rash.  Objective:   Physical Exam  Vital signs noted  Well developed well nourished female.  HEENT - Head atraumatic Normocephalic                Eyes - PERRLA, Conjuctiva - clear Sclera- Clear EOMI                Ears - EAC's Wnl TM's Wnl Gross Hearing WNL                Nose - Nares patent                 Throat - oropharanx wnl Respiratory - Lungs CTA bilateral Cardiac - RRR S1 and S2 without murmur Abdomen - Soft nontender      Results for orders placed in visit on 01/03/13  POCT UA - MICROSCOPIC ONLY      Result Value Range   WBC, Ur, HPF, POC 10-15     RBC, urine, microscopic 5-10     Bacteria, U Microscopic rare     Mucus, UA neg     Epithelial cells, urine per micros occ     Crystals, Ur, HPF, POC neg     Casts, Ur, LPF, POC neg     Yeast, UA neg    POCT URINALYSIS DIPSTICK      Result Value Range   Color, UA yellow     Clarity, UA cloudy     Glucose, UA neg     Bilirubin, UA neg     Ketones, UA neg     Spec Grav, UA 1.020     Blood, UA mod     pH, UA 6.0     Protein, UA 100     Urobilinogen, UA negative     Nitrite, UA neg     Leukocytes, UA moderate (2+)     Assessment & Plan:  Flank pain, acute - Plan: POCT UA - Microscopic Only, POCT urinalysis dipstick, ciprofloxacin (CIPRO) 500 MG tablet  Abdominal  pain, other specified site - Plan: POCT UA - Microscopic Only, POCT urinalysis dipstick, ciprofloxacin (CIPRO) 500 MG tablet  UTI (urinary tract infection) - Plan: ciprofloxacin (CIPRO) 500 MG tablet  Deatra Canter FNP

## 2013-01-10 DIAGNOSIS — M47817 Spondylosis without myelopathy or radiculopathy, lumbosacral region: Secondary | ICD-10-CM | POA: Diagnosis not present

## 2013-01-10 DIAGNOSIS — R109 Unspecified abdominal pain: Secondary | ICD-10-CM | POA: Diagnosis not present

## 2013-01-17 DIAGNOSIS — M549 Dorsalgia, unspecified: Secondary | ICD-10-CM | POA: Diagnosis not present

## 2013-01-18 ENCOUNTER — Telehealth: Payer: Self-pay

## 2013-01-18 NOTE — Telephone Encounter (Signed)
Called to give Ashley Peters a report on pt with back pain  Dr Chaney Malling will fax over her notes

## 2013-01-18 NOTE — Telephone Encounter (Signed)
Patient was called at home and encouraged to follow up for her back pain and UTI after Dr. Chaney Malling From [facility] called and discussed patient visit with [facility] RN and recommended A CT scan or MRI.  Patient states she feels fine and will follow up in 4 days if not doing better.

## 2013-02-10 ENCOUNTER — Other Ambulatory Visit: Payer: Self-pay | Admitting: *Deleted

## 2013-02-10 MED ORDER — FELODIPINE ER 5 MG PO TB24: 5.0000 mg | ORAL_TABLET | Freq: Every day | ORAL | Status: DC

## 2013-02-10 MED ORDER — SIMVASTATIN 10 MG PO TABS: 10.0000 mg | ORAL_TABLET | Freq: Every day | ORAL | Status: DC

## 2013-02-10 MED ORDER — CARVEDILOL 12.5 MG PO TABS: 12.5000 mg | ORAL_TABLET | Freq: Every day | ORAL | Status: DC

## 2013-02-10 MED ORDER — HYDROCHLOROTHIAZIDE 25 MG PO TABS: 25.0000 mg | ORAL_TABLET | Freq: Every day | ORAL | Status: DC

## 2013-02-23 ENCOUNTER — Other Ambulatory Visit: Payer: Self-pay

## 2013-02-23 MED ORDER — FENOFIBRATE 145 MG PO TABS: 145.0000 mg | ORAL_TABLET | Freq: Every day | ORAL | Status: DC

## 2013-02-23 NOTE — Telephone Encounter (Signed)
Last seen 01/03/13  B [place]   Last lipids 4/14  If approved print for mail order and route to nurse

## 2013-04-24 ENCOUNTER — Other Ambulatory Visit: Payer: Self-pay | Admitting: Family Medicine

## 2013-04-27 NOTE — Telephone Encounter (Signed)
Last labs 04/14

## 2013-04-28 NOTE — Telephone Encounter (Signed)
ntbs

## 2013-07-06 DIAGNOSIS — H1045 Other chronic allergic conjunctivitis: Secondary | ICD-10-CM | POA: Diagnosis not present

## 2013-07-06 DIAGNOSIS — Z961 Presence of intraocular lens: Secondary | ICD-10-CM | POA: Diagnosis not present

## 2013-07-06 DIAGNOSIS — H02839 Dermatochalasis of unspecified eye, unspecified eyelid: Secondary | ICD-10-CM | POA: Diagnosis not present

## 2013-07-06 DIAGNOSIS — H353190 Nonexudative age-related macular degeneration, unspecified eye, stage unspecified: Secondary | ICD-10-CM | POA: Diagnosis not present

## 2013-07-09 ENCOUNTER — Other Ambulatory Visit: Payer: Self-pay | Admitting: Nurse Practitioner

## 2013-09-21 ENCOUNTER — Other Ambulatory Visit: Payer: Self-pay | Admitting: Nurse Practitioner

## 2013-09-21 NOTE — Telephone Encounter (Signed)
Patient last seen in office on 01/03/13. Please advise on 90 day refill

## 2013-09-21 NOTE — Telephone Encounter (Signed)
no more refills without being seen  

## 2013-11-16 ENCOUNTER — Other Ambulatory Visit: Payer: Self-pay

## 2013-11-16 DIAGNOSIS — Z1231 Encounter for screening mammogram for malignant neoplasm of breast: Secondary | ICD-10-CM

## 2013-12-02 ENCOUNTER — Other Ambulatory Visit: Payer: Self-pay | Admitting: Nurse Practitioner

## 2013-12-05 NOTE — Telephone Encounter (Signed)
Patient last seen in office on 01-03-13 for an acute visit. Last chronic follow up was 06-28-12. Please advise on 90 day refill

## 2013-12-05 NOTE — Telephone Encounter (Signed)
no more refills without being seen  

## 2013-12-19 ENCOUNTER — Ambulatory Visit
Admission: RE | Admit: 2013-12-19 | Discharge: 2013-12-19 | Disposition: A | Discharge status: Home / Self Care | Payer: Medicare Other | Source: Ambulatory Visit

## 2013-12-19 DIAGNOSIS — Z1231 Encounter for screening mammogram for malignant neoplasm of breast: Secondary | ICD-10-CM | POA: Diagnosis not present

## 2013-12-22 ENCOUNTER — Ambulatory Visit (INDEPENDENT_AMBULATORY_CARE_PROVIDER_SITE_OTHER): Payer: Medicare Other

## 2013-12-22 DIAGNOSIS — Z23 Encounter for immunization: Secondary | ICD-10-CM | POA: Diagnosis not present

## 2014-02-15 ENCOUNTER — Other Ambulatory Visit: Payer: Self-pay | Admitting: Nurse Practitioner

## 2014-02-15 NOTE — Telephone Encounter (Signed)
Last seen 01/03/13 B [place]  Last lipids 06/28/12

## 2014-07-12 DIAGNOSIS — H02831 Dermatochalasis of right upper eyelid: Secondary | ICD-10-CM | POA: Diagnosis not present

## 2014-07-12 DIAGNOSIS — Z961 Presence of intraocular lens: Secondary | ICD-10-CM | POA: Diagnosis not present

## 2014-07-12 DIAGNOSIS — H10413 Chronic giant papillary conjunctivitis, bilateral: Secondary | ICD-10-CM | POA: Diagnosis not present

## 2014-07-12 DIAGNOSIS — H02834 Dermatochalasis of left upper eyelid: Secondary | ICD-10-CM | POA: Diagnosis not present

## 2014-07-12 DIAGNOSIS — H3531 Nonexudative age-related macular degeneration: Secondary | ICD-10-CM | POA: Diagnosis not present

## 2014-07-28 ENCOUNTER — Ambulatory Visit: Payer: Medicare Other | Admitting: Nurse Practitioner

## 2014-07-28 DIAGNOSIS — M79605 Pain in left leg: Secondary | ICD-10-CM | POA: Diagnosis not present

## 2014-07-30 ENCOUNTER — Encounter (HOSPITAL_COMMUNITY): Payer: Self-pay | Admitting: Emergency Medicine

## 2014-07-30 ENCOUNTER — Encounter (HOSPITAL_COMMUNITY): Payer: Medicare Other

## 2014-07-30 ENCOUNTER — Emergency Department (HOSPITAL_BASED_OUTPATIENT_CLINIC_OR_DEPARTMENT_OTHER): Payer: Medicare Other

## 2014-07-30 ENCOUNTER — Emergency Department (HOSPITAL_COMMUNITY)
Admission: EM | Admit: 2014-07-30 | Discharge: 2014-07-30 | Disposition: A | Discharge status: Home / Self Care | Payer: Medicare Other | Attending: Emergency Medicine | Admitting: Emergency Medicine

## 2014-07-30 ENCOUNTER — Emergency Department (HOSPITAL_COMMUNITY): Payer: Medicare Other

## 2014-07-30 DIAGNOSIS — M79605 Pain in left leg: Secondary | ICD-10-CM | POA: Diagnosis present

## 2014-07-30 DIAGNOSIS — M4186 Other forms of scoliosis, lumbar region: Secondary | ICD-10-CM | POA: Diagnosis not present

## 2014-07-30 DIAGNOSIS — Z8719 Personal history of other diseases of the digestive system: Secondary | ICD-10-CM | POA: Insufficient documentation

## 2014-07-30 DIAGNOSIS — Z7982 Long term (current) use of aspirin: Secondary | ICD-10-CM | POA: Diagnosis not present

## 2014-07-30 DIAGNOSIS — I10 Essential (primary) hypertension: Secondary | ICD-10-CM | POA: Insufficient documentation

## 2014-07-30 DIAGNOSIS — M4306 Spondylolysis, lumbar region: Secondary | ICD-10-CM | POA: Diagnosis not present

## 2014-07-30 DIAGNOSIS — E785 Hyperlipidemia, unspecified: Secondary | ICD-10-CM | POA: Insufficient documentation

## 2014-07-30 DIAGNOSIS — M47816 Spondylosis without myelopathy or radiculopathy, lumbar region: Secondary | ICD-10-CM | POA: Insufficient documentation

## 2014-07-30 DIAGNOSIS — M79609 Pain in unspecified limb: Secondary | ICD-10-CM | POA: Diagnosis not present

## 2014-07-30 DIAGNOSIS — Z79899 Other long term (current) drug therapy: Secondary | ICD-10-CM | POA: Diagnosis not present

## 2014-07-30 DIAGNOSIS — M5136 Other intervertebral disc degeneration, lumbar region: Secondary | ICD-10-CM | POA: Diagnosis not present

## 2014-07-30 DIAGNOSIS — Z880 Allergy status to penicillin: Secondary | ICD-10-CM | POA: Insufficient documentation

## 2014-07-30 MED ORDER — OXYCODONE-ACETAMINOPHEN 5-325 MG PO TABS: 1.0000 | ORAL_TABLET | Freq: Once | ORAL | Status: DC

## 2014-07-30 MED ORDER — OXYCODONE-ACETAMINOPHEN 5-325 MG PO TABS
1.0000 | ORAL_TABLET | Freq: Once | ORAL | Status: AC
  Administered 2014-07-30: 1 via ORAL
  Filled 2014-07-30: qty 1

## 2014-07-30 NOTE — ED Notes (Signed)
Pt states her left leg started hurting 4 days ago. Pt went to urgent care on Friday and was told that it was not a blood clot- pt was given muscle relaxers. Pt states the pain started in her quad and moves all the way down. Aching/cramping pain. Pt states muscle relaxer did not help her.

## 2014-07-30 NOTE — Discharge Instructions (Signed)
Take 1 Aleve 1-2 times a day as needed

## 2014-07-30 NOTE — Progress Notes (Signed)
VASCULAR LAB PRELIMINARY  PRELIMINARY  PRELIMINARY  PRELIMINARY  Left lower extremity venous duplex completed.    Preliminary report:  Left:  No evidence of DVT, superficial thrombosis, or Baker's cyst.  Jazline Cumbee, RVS 07/30/2014, 10:14 AM

## 2014-07-30 NOTE — ED Provider Notes (Signed)
CSN: [id number]     Arrival date & time 07/30/14  0109 History   First MD Initiated Contact with Patient 07/30/14 [phone]     Chief Complaint  Patient presents with  . Leg Pain      HPI  Expand All Collapse All   Pt states her left leg started hurting 4 days ago. Pt went to urgent care on Friday and was told that it was not a blood clot- pt was given muscle relaxers. Pt states the pain started in her quad and moves all the way down. Aching/cramping pain. Pt states muscle relaxer did not help her        Past Medical History  Diagnosis Date  . Hyperlipidemia   . Hypertension   . Osteopenia   . Diverticulosis   . GERD (gastroesophageal reflux disease)    Past Surgical History  Procedure Laterality Date  . Cholecystectomy    . Abdominal hysterectomy    . Brest biopsy  1971  . Ablation tr great saphenous vein  10/17/2009  . Lt. distal ureteral stone extraction  09/14/1991   Family History  Problem Relation Age of Onset  . Stroke Mother   . Stroke Father    History  Substance Use Topics  . Smoking status: Never Smoker   . Smokeless tobacco: Not on file  . Alcohol Use: No   OB History    No data available     Review of Systems  All other systems reviewed and are negative.     Allergies  Benicar and Amoxicillin  Home Medications   Prior to Admission medications   Medication Sig Start Date End Date Taking? Authorizing Provider  aspirin 81 MG EC tablet Take 81 mg by mouth daily.     Yes Historical Provider, MD  Calcium Citrate-Vitamin D 500-250 MG-UNIT PACK Take by mouth. One bid     Yes Historical Provider, MD  carvedilol (COREG) 12.5 MG tablet TAKE 1 TABLET DAILY 12/05/13  Yes Mary-Margaret Hassell Done, FNP  ciprofloxacin (CIPRO) 500 MG tablet Take 1 tablet (500 mg total) by mouth 2 (two) times daily. Patient not taking: Reported on 07/30/2014 01/03/13   Lysbeth Penner, FNP  cyclobenzaprine (FLEXERIL) 10 MG tablet Take 10 mg by mouth 3 (three) times daily as needed  for muscle spasms.   Yes Historical Provider, MD  felodipine (PLENDIL) 5 MG 24 hr tablet TAKE 1 TABLET DAILY 12/05/13  Yes Mary-Margaret Hassell Done, FNP  fenofibrate (TRICOR) 145 MG tablet Take 1 tablet (145 mg total) by mouth daily. 02/23/13  Yes Lysbeth Penner, FNP  hydrochlorothiazide (HYDRODIURIL) 25 MG tablet TAKE 1 TABLET DAILY Patient not taking: Reported on 07/30/2014 12/05/13   Mary-Margaret Hassell Done, FNP  ibandronate (BONIVA) 150 MG tablet Take 150 mg by mouth every 30 (thirty) days. Take in the morning with a full glass of water, on an empty stomach, and do not take anything else by mouth or lie down for the next 30 min. Takes on the 30th of each month.   Yes Historical Provider, MD  ibuprofen (ADVIL,MOTRIN) 200 MG tablet Take 200 mg by mouth every 4 (four) hours as needed for mild pain.   Yes Historical Provider, MD  oxyCODONE-acetaminophen (PERCOCET/ROXICET) 5-325 MG per tablet Take 1 tablet by mouth once. 07/30/14   Leonard Schwartz, MD  simvastatin (ZOCOR) 10 MG tablet TAKE 1 TABLET AT BEDTIME Patient not taking: Reported on 07/30/2014 12/05/13   Mary-Margaret Hassell Done, FNP   BP 126/49 mmHg  Pulse 81  Temp(Src) 98  F (36.7 C) (Oral)  Resp 18  Ht 5' (1.524 m)  Wt 140 lb (63.504 kg)  BMI 27.34 kg/m2  SpO2 96% Physical Exam  Musculoskeletal:       Legs: Areas of pain to palpation   Physical Exam  Nursing note and vitals reviewed. Constitutional: She is oriented to person, place, and time. She appears well-developed and well-nourished. No distress.  HENT:  Head: Normocephalic and atraumatic.  Eyes: Pupils are equal, round, and reactive to light.  Neck: Normal range of motion.  Cardiovascular: Normal rate and intact distal pulses.   Pulmonary/Chest: No respiratory distress.  Abdominal: Normal appearance. She exhibits no distension.  Musculoskeletal: Normal range of motion. good strong pulses in all extremities. Neurological: She is alert and oriented to person, place, and time. No  cranial nerve deficit.  Skin: Skin is warm and dry. No rash noted.  Psychiatric: She has a normal mood and affect. Her behavior is normal.   ED Course  Procedures (including critical care time) Labs Review Labs Reviewed - No data to display  Imaging Review Dg Lumbar Spine Complete  07/30/2014   CLINICAL DATA:  Left leg pain for the past 3-4 days. Low back pain for multiple years. No known injury.  EXAM: LUMBAR SPINE - COMPLETE 4+ VIEW  COMPARISON:  01/10/2013.  FINDINGS: Five non-rib-bearing lumbar vertebrae. Stable mild levoconvex lumbar rotary scoliosis. Multilevel degenerative changes are again demonstrated with marked disc space narrowing at the L2-3 level and large right lateral spurs at that level. Small to moderate sized anterior spurs at that level. Facet degenerative changes at multiple levels. No fractures, pars defects or acute subluxations. Cholecystectomy clips.  IMPRESSION: Stable lumbar spine degenerative changes and scoliosis.   Electronically Signed   By: Claudie Revering M.D.   On: 07/30/2014 08:21   Venous Doppler Exam done and was negative for DVT   MDM   Final diagnoses:  Lumbar spondylosis, unspecified spinal osteoarthritis        Leonard Schwartz, MD 07/31/14 1018

## 2014-08-01 ENCOUNTER — Encounter: Payer: Self-pay | Admitting: Nurse Practitioner

## 2014-08-01 ENCOUNTER — Ambulatory Visit (INDEPENDENT_AMBULATORY_CARE_PROVIDER_SITE_OTHER): Payer: Medicare Other | Admitting: Nurse Practitioner

## 2014-08-01 VITALS — BP 152/88 | HR 91 | Temp 97.0°F | Ht 60.0 in | Wt 151.0 lb

## 2014-08-01 DIAGNOSIS — M79605 Pain in left leg: Secondary | ICD-10-CM | POA: Diagnosis not present

## 2014-08-01 DIAGNOSIS — Z09 Encounter for follow-up examination after completed treatment for conditions other than malignant neoplasm: Secondary | ICD-10-CM

## 2014-08-01 NOTE — Progress Notes (Signed)
   Subjective:    Patient ID: Ashley Peters, female    DOB: 02-24-1929, 81 y.o.   MRN: [id number]  HPI Patient went to [facility] ER with c/o leg pain- they told her it was coming from her back- The next morning she went to [facility] ER and they did xray which really did not show anything other than degenerative changes. Doppler study showed no blood clot They gave her lortab for pain- she has only taken them 2x because she is afraid of side effects. Currently the left thigh pain is constant and eases off with pain pill or advil. The pain is actually down the front of her leg.    Review of Systems  Constitutional: Negative.   HENT: Negative.   Respiratory: Negative.   Cardiovascular: Negative.   Gastrointestinal: Negative.   Genitourinary: Negative.   Neurological: Negative.   Psychiatric/Behavioral: Negative.   All other systems reviewed and are negative.      Objective:   Physical Exam  Constitutional: She is oriented to person, place, and time. She appears well-developed and well-nourished.  Cardiovascular: Normal rate, regular rhythm and normal heart sounds.   Pulmonary/Chest: Effort normal and breath sounds normal.  Musculoskeletal:  No print tenderness on palpation- FROM of left leg without pain  Neurological: She is alert and oriented to person, place, and time.  Skin: Skin is warm and dry.  Psychiatric: She has a normal mood and affect. Her behavior is normal. Judgment and thought content normal.   BP 152/88 mmHg  Pulse 91  Temp(Src) 97 F (36.1 C) (Oral)  Ht 5' (1.524 m)  Wt 151 lb (68.493 kg)  BMI 29.49 kg/m2        Assessment & Plan:  1. Hospital discharge follow-up Records reviewed  2. Left leg pain Rest  no heavy lifting Rest Aleve as needed  Mary-Margaret Hassell Done, FNP

## 2014-08-11 ENCOUNTER — Ambulatory Visit: Payer: TRICARE For Life (TFL) | Admitting: Family

## 2014-08-14 ENCOUNTER — Encounter: Payer: Self-pay | Admitting: Family

## 2014-08-14 ENCOUNTER — Ambulatory Visit (INDEPENDENT_AMBULATORY_CARE_PROVIDER_SITE_OTHER): Payer: Medicare Other | Admitting: Physician Assistant

## 2014-08-14 VITALS — BP 145/70 | HR 77 | Temp 97.1°F | Ht 60.0 in | Wt 148.0 lb

## 2014-08-14 DIAGNOSIS — M545 Low back pain: Secondary | ICD-10-CM | POA: Diagnosis not present

## 2014-08-14 DIAGNOSIS — M79605 Pain in left leg: Secondary | ICD-10-CM | POA: Diagnosis not present

## 2014-08-14 NOTE — Progress Notes (Signed)
   Subjective:    Patient ID: Ashley Peters, female    DOB: [birth year], 81 y.o.   MRN: [id number]  HPIj 81 y/o female presents with c/o left leg pain x 1 month. She visited Urgent Care on 2 weeks ago and was told it was related to her back. She visited [facility] ED on  07/30/14 , was dx with Lumbar spondylosis and spinal osteoarthritis. DVT was ruled out with venous duplex.   Pain is aching, constant, relieved with Aleve 2 pills twice daily. Pain goes from upper leg to toes.     Review of Systems  Constitutional: Negative.   Musculoskeletal: Positive for myalgias (left leg pain, constant "ache", inconsistent with back pain) and back pain (intermittent low back pain, worse on left side ).  Neurological: Negative.  Negative for weakness and numbness.       Objective:   Physical Exam  Constitutional: She is oriented to person, place, and time. She appears well-developed and well-nourished. No distress.  Pulmonary/Chest: Effort normal.  Musculoskeletal: Normal range of motion. She exhibits no edema or tenderness.  Negative for ttp of lower back or over paraspinal muscles/spineaou processes  Neurological: She is alert and oriented to person, place, and time. No cranial nerve deficit. Coordination normal.  Negative for weakness in plantar or dorsiflexion of Bilateral feet.   Skin: She is not diaphoretic.  Psychiatric: She has a normal mood and affect. Her behavior is normal. Judgment and thought content normal.  Nursing note and vitals reviewed.         Assessment & Plan:  1. Left leg pain - continue to take Aleve 2 pills po BID until f/u in ortho - Rest, no lifting - CMP14+EGFR - Ambulatory referral to Orthopedic Surgery  2. Bilateral low back pain, with sciatica presence unspecified  - Ambulatory referral to Orthopedic Surgery    Trenese Haft A. Benjamin Stain PA-C

## 2014-08-15 LAB — CMP14+EGFR
A/G RATIO: 1.7 (ref 1.1–2.5)
ALBUMIN: 3.8 g/dL (ref 3.5–4.7)
ALK PHOS: 43 IU/L (ref 39–117)
ALT: 14 IU/L (ref 0–32)
AST: 14 IU/L (ref 0–40)
BUN/Creatinine Ratio: 28 — ABNORMAL HIGH (ref 11–26)
BUN: 23 mg/dL (ref 8–27)
Bilirubin Total: 0.3 mg/dL (ref 0.0–1.2)
CO2: 22 mmol/L (ref 18–29)
Calcium: 9.7 mg/dL (ref 8.7–10.3)
Chloride: 103 mmol/L (ref 97–108)
Creatinine, Ser: 0.83 mg/dL (ref 0.57–1.00)
GFR calc Af Amer: 75 mL/min/{1.73_m2} (ref >59)
GFR calc non Af Amer: 65 mL/min/{1.73_m2} (ref >59)
GLUCOSE: 97 mg/dL (ref 65–99)
Globulin, Total: 2.3 g/dL (ref 1.5–4.5)
POTASSIUM: 4.3 mmol/L (ref 3.5–5.2)
Sodium: 141 mmol/L (ref 134–144)
Total Protein: 6.1 g/dL (ref 6.0–8.5)

## 2014-08-16 ENCOUNTER — Telehealth: Payer: Self-pay | Admitting: *Deleted

## 2014-08-16 NOTE — Telephone Encounter (Signed)
-----   Message from Adella Nissen, PA-C sent at 08/15/2014  2:36 PM EDT ----- Labs WNL. I do not feel that leg pain is related to Potassium deficiency. Ortho referral sent. Tiffany A. Benjamin Stain PA-C

## 2014-08-16 NOTE — Progress Notes (Signed)
Patient aware.

## 2014-08-17 DIAGNOSIS — M5136 Other intervertebral disc degeneration, lumbar region: Secondary | ICD-10-CM | POA: Diagnosis not present

## 2014-08-17 DIAGNOSIS — M5442 Lumbago with sciatica, left side: Secondary | ICD-10-CM | POA: Diagnosis not present

## 2014-08-24 ENCOUNTER — Ambulatory Visit: Payer: Medicare Other | Attending: Family | Admitting: Physical Therapy

## 2014-08-24 DIAGNOSIS — M545 Low back pain: Secondary | ICD-10-CM | POA: Diagnosis not present

## 2014-08-24 NOTE — Therapy (Signed)
[facility] [place], [place], [address] Phone: [phone]   Fax:  [phone]  Physical Therapy Evaluation  Patient Details  Name: Ashley Peters MRN: [id number] Date of Birth: [birth year] Referring Provider:  Sharion Balloon, FNP  Encounter Date: 08/24/2014      PT End of Session - 08/24/14 1628    Visit Number 1   Number of Visits 12   Date for PT Re-Evaluation 10/19/14   PT Start Time 0230   PT Stop Time 0317   PT Time Calculation (min) 47 min   Activity Tolerance Patient tolerated treatment well   Behavior During Therapy [facility] for tasks assessed/performed      Past Medical History  Diagnosis Date  . Hyperlipidemia   . Hypertension   . Osteopenia   . Diverticulosis   . GERD (gastroesophageal reflux disease)     Past Surgical History  Procedure Laterality Date  . Cholecystectomy    . Abdominal hysterectomy    . Brest biopsy  1971  . Ablation tr great saphenous vein  10/17/2009  . Lt. distal ureteral stone extraction  09/14/1991    There were no vitals filed for this visit.  Visit Diagnosis:  Left low back pain, with sciatica presence unspecified - Plan: PT plan of care cert/re-cert      Subjective Assessment - 08/24/14 1631    Subjective (p) Back hurts on left side.   Patient Stated Goals (p) Get out of pain.   Currently in Pain? (p) Yes   Pain Location (p) Back   Pain Orientation (p) Left   Pain Descriptors / Indicators (p) Aching;Constant   Pain Type (p) --  Sub-acute.   Pain Onset (p) 1 to 4 weeks ago   Pain Frequency (p) Constant   Aggravating Factors  (p) Lying flat.            [facility] PT Assessment - 08/24/14 0001    Assessment   Medical Diagnosis degenerative lumbar disc.   Onset Date/Surgical Date --  4 weeks.   Precautions   Precautions --  OP.   Restrictions   Weight Bearing Restrictions No   Balance Screen   Has the patient fallen in the past 6 months No   Has the  patient had a decrease in activity level because of a fear of falling?  No   Is the patient reluctant to leave their home because of a fear of falling?  No   Home Ecologist residence   Prior Function   Level of Independence Independent   Cognition   Overall Cognitive Status Within Functional Limits for tasks assessed   Posture/Postural Control   Posture/Postural Control Postural limitations   Postural Limitations Rounded Shoulders;Forward head;Decreased lumbar lordosis   ROM / Strength   AROM / PROM / Strength AROM   AROM   Overall AROM Comments Deferred spinal testing due to OP.   Palpation   Palpation comment --  Tender left of L3-L4 in lumbar musculaturee.   Special Tests    Special Tests Lumbar;Leg LengthTest   Lumbar Tests --  Negative bil SLR testing.   Leg length test  --  Equal leg lengths.   Ambulation/Gait   Gait Comments WNL.                   [facility] Adult PT Treatment/Exercise - 08/24/14 0001    Modalities   Modalities Education officer, environmental  Stimulation Location Left low back   Electrical Stimulation Action 80-150HZ  pre-mod e'stim   Electrical Stimulation Goals Pain                  PT Short Term Goals - September 18, 2014 1833    PT SHORT TERM GOAL #1   Title Ind with HEP.   Time 2   Period Weeks   Status New           PT Long Term Goals - 09-18-14 1833    PT LONG TERM GOAL #1   Title Perform ADL's with pain not > 3/10.   Time 4   Period Weeks   Status New   PT LONG TERM GOAL #2   Title Eliminate left LE symptoms.   Time 4   Period Weeks   Status New               Plan - Sep 18, 2014 1819    Clinical Impression Statement The patient reportes an onset of low back pain with radiation into her left anterior thigh and front of lower leg.  Her pain is rated at 7-8/10 today.  Lying flat increases her pain and sitting up decreases her pain.   Pt will benefit from  skilled therapeutic intervention in order to improve on the following deficits Pain;Decreased activity tolerance   Rehab Potential Excellent   PT Frequency 3x / week   PT Duration 4 weeks   PT Treatment/Interventions ADLs/Self Care Home Management;Electrical Stimulation;Moist Heat;Ultrasound;Therapeutic exercise;Therapeutic activities;Patient/family education;Manual techniques   PT Next Visit Plan Modalities and STW/M to left low back; right sdly position for positional traction; core exercises.          G-Codes - 2014/09/18 1835    Functional Assessment Tool Used FOTO.   Functional Limitation Mobility: Walking and moving around   Mobility: Walking and Moving Around Current Status [phone]) At least 40 percent but less than 60 percent impaired, limited or restricted   Mobility: Walking and Moving Around Goal Status [phone]) At least 1 percent but less than 20 percent impaired, limited or restricted       Problem List Patient Active Problem List   Diagnosis Date Noted  . HTN (hypertension) 07/15/2010  . GERD (gastroesophageal reflux disease) 07/15/2010  . Hyperlipemia 07/15/2010  . Osteopenia 07/15/2010  . Diverticulosis 07/15/2010    Everado Pillsbury, [place] MPT 09-18-14, 6:42 PM  [facility] [address] [place], [place], [address] Phone: [phone]   Fax:  [phone]

## 2014-08-29 ENCOUNTER — Ambulatory Visit: Payer: Medicare Other | Admitting: Physical Therapy

## 2014-08-29 ENCOUNTER — Encounter: Payer: Self-pay | Admitting: Physical Therapy

## 2014-08-29 DIAGNOSIS — M545 Low back pain: Secondary | ICD-10-CM

## 2014-08-29 NOTE — Therapy (Signed)
[facility] [place], [place], [address] Phone: [phone]   Fax:  [phone]  Physical Therapy Treatment  Patient Details  Name: Ashley Peters MRN: [id number] Date of Birth: [birth year] Referring Provider:  Sharion Balloon, FNP  Encounter Date: 08/29/2014      PT End of Session - 08/29/14 1351    Visit Number 2   Number of Visits 12   Date for PT Re-Evaluation 10/19/14   PT Start Time 1347   PT Stop Time 1430   PT Time Calculation (min) 43 min   Activity Tolerance Patient tolerated treatment well   Behavior During Therapy [facility] for tasks assessed/performed      Past Medical History  Diagnosis Date  . Hyperlipidemia   . Hypertension   . Osteopenia   . Diverticulosis   . GERD (gastroesophageal reflux disease)     Past Surgical History  Procedure Laterality Date  . Cholecystectomy    . Abdominal hysterectomy    . Brest biopsy  1971  . Ablation tr great saphenous vein  10/17/2009  . Lt. distal ureteral stone extraction  09/14/1991    There were no vitals filed for this visit.  Visit Diagnosis:  Left low back pain, with sciatica presence unspecified      Subjective Assessment - 08/29/14 1350    Subjective "It still hurts."   Currently in Pain? Yes   Pain Location Back   Pain Orientation Left;Posterior;Distal   Pain Descriptors / Indicators Aching            [facility] PT Assessment - 08/29/14 0001    Assessment   Medical Diagnosis degenerative lumbar disc.                     [facility] Adult PT Treatment/Exercise - 08/29/14 0001    Exercises   Exercises Lumbar   Lumbar Exercises: Sidelying   Other Sidelying Lumbar Exercises Positional traction R sidelying with bolster x16 min   Manual Therapy   Manual Therapy Myofascial release   Myofascial Release STW to L lumbar paraspinals to decrease tightness and pain in R sidelying                  PT Short Term Goals - 08/24/14 1833     PT SHORT TERM GOAL #1   Title Ind with HEP.   Time 2   Period Weeks   Status New           PT Long Term Goals - 08/24/14 1833    PT LONG TERM GOAL #1   Title Perform ADL's with pain not > 3/10.   Time 4   Period Weeks   Status New   PT LONG TERM GOAL #2   Title Eliminate left LE symptoms.   Time 4   Period Weeks   Status New               Plan - 08/29/14 1433    Clinical Impression Statement Patient tolerated treatment well today and tolerated positional traction well without any complaints. Minimal tightness noted in L lumbar paraspinals and Quadratus Lumborum. Moderate pressure tolerated during manual therapy. Denied pain following treatment and felt better with decreased tightness.   Pt will benefit from skilled therapeutic intervention in order to improve on the following deficits Pain;Decreased activity tolerance   Rehab Potential Excellent   PT Frequency 3x / week   PT Duration 4 weeks   PT Treatment/Interventions ADLs/Self Care Home Management;Electrical  Stimulation;Moist Heat;Ultrasound;Therapeutic exercise;Therapeutic activities;Patient/family education;Manual techniques   PT Next Visit Plan Continue per MPT POC. Initate core strengthening next treatment,   Consulted and Agree with Plan of Care Patient        Problem List Patient Active Problem List   Diagnosis Date Noted  . HTN (hypertension) 07/15/2010  . GERD (gastroesophageal reflux disease) 07/15/2010  . Hyperlipemia 07/15/2010  . Osteopenia 07/15/2010  . Diverticulosis 07/15/2010    Wynelle Fanny, PTA 08/29/2014, 3:39 PM  [facility] [address] [place], [place], [address] Phone: [phone]   Fax:  [phone]

## 2014-08-31 ENCOUNTER — Ambulatory Visit: Payer: Medicare Other | Admitting: Physical Therapy

## 2014-08-31 ENCOUNTER — Encounter: Payer: Self-pay | Admitting: Physical Therapy

## 2014-08-31 DIAGNOSIS — M545 Low back pain: Secondary | ICD-10-CM

## 2014-08-31 NOTE — Therapy (Signed)
[facility] [place], [place], [address] Phone: [phone]   Fax:  [phone]  Physical Therapy Treatment  Patient Details  Name: Ashley Peters MRN: [id number] Date of Birth: [birth year] Referring Provider:  Sharion Balloon, FNP  Encounter Date: 08/31/2014      PT End of Session - 08/31/14 1351    Visit Number 3   Number of Visits 12   Date for PT Re-Evaluation 10/19/14   PT Start Time 7035   PT Stop Time 1427   PT Time Calculation (min) 39 min   Activity Tolerance Patient tolerated treatment well   Behavior During Therapy [facility] for tasks assessed/performed      Past Medical History  Diagnosis Date  . Hyperlipidemia   . Hypertension   . Osteopenia   . Diverticulosis   . GERD (gastroesophageal reflux disease)     Past Surgical History  Procedure Laterality Date  . Cholecystectomy    . Abdominal hysterectomy    . Brest biopsy  1971  . Ablation tr great saphenous vein  10/17/2009  . Lt. distal ureteral stone extraction  09/14/1991    There were no vitals filed for this visit.  Visit Diagnosis:  Left low back pain, with sciatica presence unspecified      Subjective Assessment - 08/31/14 1350    Subjective Reports that her back hurts but her L leg does not hurt today. Asked to be done with session by 2:30 so that she could get back to her brother's house.   Currently in Pain? Yes   Pain Score 6    Pain Location Back   Pain Orientation Right;Left;Lower  Mostly L low back   Pain Descriptors / Indicators Sore            [facility] PT Assessment - 08/31/14 0001    Assessment   Medical Diagnosis degenerative lumbar disc.                     [facility] Adult PT Treatment/Exercise - 08/31/14 0001    Modalities   Modalities Electrical Stimulation   Electrical Stimulation   Electrical Stimulation Location B lumbar paraspinals   Electrical Stimulation Action Pre-Mod   Electrical Stimulation  Parameters 80-150 Hz x15 min   Electrical Stimulation Goals Pain   Manual Therapy   Manual Therapy Myofascial release   Myofascial Release STW to L lumbar paraspinals to decrease tightness and pain in R sidelying                  PT Short Term Goals - 08/31/14 1401    PT SHORT TERM GOAL #1   Title Ind with HEP.   Time 2   Period Weeks   Status On-going           PT Long Term Goals - 08/31/14 1401    PT LONG TERM GOAL #1   Title Perform ADL's with pain not > 3/10.   Time 4   Period Weeks   Status On-going   PT LONG TERM GOAL #2   Title Eliminate left LE symptoms.   Time 4   Period Weeks   Status On-going               Plan - 08/31/14 1429    Clinical Impression Statement Patient tolerated treatment well today wihtout complaint of soreness or pain during treatment. Minimal tightness noted in the L lumbar paraspinals and Quadratus Lumborum. Patient was able to tolerate moderate pressure  during manual therapy. Normal modalites response noted following removal of the modalties. Denied pain or soreness following treatment.   Pt will benefit from skilled therapeutic intervention in order to improve on the following deficits Pain;Decreased activity tolerance   Rehab Potential Excellent   PT Frequency 3x / week   PT Duration 4 weeks   PT Treatment/Interventions ADLs/Self Care Home Management;Electrical Stimulation;Moist Heat;Ultrasound;Therapeutic exercise;Therapeutic activities;Patient/family education;Manual techniques   PT Next Visit Plan Continue per MPT POC. Initate core strengthening next treatment,   Consulted and Agree with Plan of Care Patient        Problem List Patient Active Problem List   Diagnosis Date Noted  . HTN (hypertension) 07/15/2010  . GERD (gastroesophageal reflux disease) 07/15/2010  . Hyperlipemia 07/15/2010  . Osteopenia 07/15/2010  . Diverticulosis 07/15/2010    Wynelle Fanny, PTA 08/31/2014, 3:22 PM  [facility] [address] [place], [place], [address] Phone: [phone]   Fax:  [phone]

## 2014-09-05 ENCOUNTER — Encounter: Payer: Self-pay | Admitting: Physical Therapy

## 2014-09-05 ENCOUNTER — Ambulatory Visit: Payer: Medicare Other | Admitting: Physical Therapy

## 2014-09-05 DIAGNOSIS — M545 Low back pain: Secondary | ICD-10-CM

## 2014-09-05 NOTE — Therapy (Signed)
[facility] [place], [place], [address] Phone: [phone]   Fax:  [phone]  Physical Therapy Treatment  Patient Details  Name: Ashley Peters MRN: [id number] Date of Birth: September 03, 1928 Referring Provider:  Sharion Balloon, FNP  Encounter Date: 09/05/2014      PT End of Session - 09/05/14 1342    Visit Number 4   Number of Visits 12   Date for PT Re-Evaluation 10/19/14   PT Start Time 7591   PT Stop Time 1355   PT Time Calculation (min) 41 min   Activity Tolerance Patient tolerated treatment well   Behavior During Therapy [facility] for tasks assessed/performed      Past Medical History  Diagnosis Date  . Hyperlipidemia   . Hypertension   . Osteopenia   . Diverticulosis   . GERD (gastroesophageal reflux disease)     Past Surgical History  Procedure Laterality Date  . Cholecystectomy    . Abdominal hysterectomy    . Brest biopsy  1971  . Ablation tr great saphenous vein  10/17/2009  . Lt. distal ureteral stone extraction  09/14/1991    There were no vitals filed for this visit.  Visit Diagnosis:  Left low back pain, with sciatica presence unspecified      Subjective Assessment - 09/05/14 1319    Subjective 50% improvement thus far, no pain reported today   Currently in Pain? No/denies                         [facility] Adult PT Treatment/Exercise - 09/05/14 0001    Lumbar Exercises: Seated   Other Seated Lumbar Exercises scap retractions 2x10   Lumbar Exercises: Supine   Ab Set 20 reps;3 seconds   Bent Knee Raise 20 reps;3 seconds   Bridge 20 reps   Straight Leg Raise 20 reps;3 seconds   Lumbar Exercises: Sidelying   Other Sidelying Lumbar Exercises Positional traction R sidelying with bolster x15 min                PT Education - 09/05/14 1331    Education provided Yes   Education Details HEP low level core activation/draw ins/ posture awareness techniques   Person(s)  Educated Patient   Methods Explanation;Demonstration;Handout   Comprehension Verbalized understanding;Returned demonstration          PT Short Term Goals - 08/31/14 1401    PT SHORT TERM GOAL #1   Title Ind with HEP.   Time 2   Period Weeks   Status On-going           PT Long Term Goals - 09/05/14 1358    PT LONG TERM GOAL #1   Title Perform ADL's with pain not > 3/10.   Time 4   Period Weeks   Status On-going  5/10   PT LONG TERM GOAL #2   Title Eliminate left LE symptoms.   Time 4   Period Weeks   Status On-going  50% improvement               Plan - 09/05/14 1343    Clinical Impression Statement Patient continues to progress to treatment and has reported no pain today. Patient has up to 5/10 pain with ADL's at times and has reported 50% improvement with left LE symptoms. HEP given for core activation/draw ins and educated patient on posture awareness techniques with bending, lifting, ADL's , and sleepin to protect back and prevent injury  or pain. Goals ongoing due to limitations with symptoms in left LE and pain with ADL"s   Pt will benefit from skilled therapeutic intervention in order to improve on the following deficits Pain;Decreased activity tolerance   Rehab Potential Excellent   PT Frequency 3x / week   PT Duration 4 weeks   PT Treatment/Interventions ADLs/Self Care Home Management;Electrical Stimulation;Moist Heat;Ultrasound;Therapeutic exercise;Therapeutic activities;Patient/family education;Manual techniques   PT Next Visit Plan Continue per MPT POC   Consulted and Agree with Plan of Care Patient        Problem List Patient Active Problem List   Diagnosis Date Noted  . HTN (hypertension) 07/15/2010  . GERD (gastroesophageal reflux disease) 07/15/2010  . Hyperlipemia 07/15/2010  . Osteopenia 07/15/2010  . Diverticulosis 07/15/2010    Phillips Climes, PTA 09/05/2014, 2:03 PM  [facility] [address] [place], [place], [address] Phone: [phone]   Fax:  [phone]

## 2014-09-05 NOTE — Patient Instructions (Signed)
Pelvic Tilt: Posterior - Legs Bent (Supine)  Tighten stomach and flatten back by rolling pelvis down. Hold _10___ seconds. Relax. Repeat _10-30___ times per set. Do __2__ sets per session. Do _2___ sessions per day.  Marching  Tighten stomach and slowly raise right leg _5___ inches from floor. Keep trunk rigid. Hold _3___ seconds. Repeat _10___ times per set. Do ___2-3_ sets per session. Do __2__ sessions per day.  Straight Leg Raise  Tighten stomach and slowly raise locked right leg __4__ inches from floor. Repeat __10-30__ times per set. Do __2__ sets per session. Do __2__ sessions per day.  Bridging  Slowly raise buttocks from floor, keeping stomach tight. Repeat _10___ times per set. Do __2__ sets per session. Do __2__ sessions per day.

## 2014-09-07 ENCOUNTER — Encounter: Payer: Self-pay | Admitting: Physical Therapy

## 2014-09-07 ENCOUNTER — Ambulatory Visit: Payer: Medicare Other | Admitting: Physical Therapy

## 2014-09-07 DIAGNOSIS — M545 Low back pain: Secondary | ICD-10-CM

## 2014-09-07 NOTE — Therapy (Signed)
[facility] [address] [place], [place], [address] Phone: [phone]   Fax:  [phone]  Physical Therapy Treatment  Patient Details  Name: Ashley Peters MRN: [id number] Date of Birth: 09/20/1928 Referring Provider:  Hawks, Christy A, FNP  Encounter Date: 09/07/2014      PT End of Session - 09/07/14 1340    Visit Number 5   Number of Visits 12   Date for PT Re-Evaluation 10/19/14   PT Start Time 1315   PT Stop Time 1356   PT Time Calculation (min) 41 min   Activity Tolerance Patient tolerated treatment well   Behavior During Therapy [facility] for tasks assessed/performed      Past Medical History  Diagnosis Date  . Hyperlipidemia   . Hypertension   . Osteopenia   . Diverticulosis   . GERD (gastroesophageal reflux disease)     Past Surgical History  Procedure Laterality Date  . Cholecystectomy    . Abdominal hysterectomy    . Brest biopsy  1971  . Ablation tr great saphenous vein  10/17/2009  . Lt. distal ureteral stone extraction  09/14/1991    There were no vitals filed for this visit.  Visit Diagnosis:  Left low back pain, with sciatica presence unspecified      Subjective Assessment - 09/07/14 1316    Subjective no LE symptoms today no pain   Currently in Pain? No/denies                         [facility] Adult PT Treatment/Exercise - 09/07/14 0001    Lumbar Exercises: Aerobic   Stationary Bike Nustep L3 x 5min posture focus   Lumbar Exercises: Seated   Other Seated Lumbar Exercises scap retractions/ext with red t-band x10 each   Other Seated Lumbar Exercises 2# reachout and D1/D2 2x10 each core focus   Lumbar Exercises: Supine   Ab Set 20 reps;3 seconds   Bent Knee Raise 20 reps;3 seconds   Bridge 20 reps   Straight Leg Raise 20 reps;3 seconds   Lumbar Exercises: Sidelying   Other Sidelying Lumbar Exercises Positional traction R sidelying with bolster x15 min                   PT Short Term Goals - 09/07/14 1346    PT SHORT TERM GOAL #1   Title Ind with HEP.   Time 2   Period Weeks   Status Achieved           PT Long Term Goals - 09/07/14 1346    PT LONG TERM GOAL #1   Title Perform ADL's with pain not > 3/10.   Time 4   Period Weeks   Status On-going   PT LONG TERM GOAL #2   Title Eliminate left LE symptoms.   Time 4   Period Weeks   Status Achieved               Plan - 09/07/14 1342    Clinical Impression Statement Patient continues to progress with all activities. Has improved and progressed with core strengthening. Has had no symptoms in left LE in a week. Patient understands initial HEP and has met STG #1 and LTG #2 other goal ongoing.   Pt will benefit from skilled therapeutic intervention in order to improve on the following deficits Pain;Decreased activity tolerance   Rehab Potential Excellent   PT Frequency 3x / week   PT Duration 4 weeks     PT Treatment/Interventions ADLs/Self Care Home Management;Electrical Stimulation;Moist Heat;Ultrasound;Therapeutic exercise;Therapeutic activities;Patient/family education;Manual techniques   PT Next Visit Plan Continue per MPT POC for core strengthening:increase nustep time and standing XTS and reachouts for progression   Consulted and Agree with Plan of Care Patient        Problem List Patient Active Problem List   Diagnosis Date Noted  . HTN (hypertension) 07/15/2010  . GERD (gastroesophageal reflux disease) 07/15/2010  . Hyperlipemia 07/15/2010  . Osteopenia 07/15/2010  . Diverticulosis 07/15/2010    DUNFORD, CHRISTINA P, PTA 09/07/2014, 1:55 PM  [facility] [address] [place], [place], [address] Phone: [phone]   Fax:  [phone]      

## 2014-09-12 ENCOUNTER — Ambulatory Visit: Payer: Medicare Other | Attending: Family | Admitting: Physical Therapy

## 2014-09-12 ENCOUNTER — Encounter: Payer: Self-pay | Admitting: Physical Therapy

## 2014-09-12 DIAGNOSIS — M545 Low back pain: Secondary | ICD-10-CM | POA: Diagnosis not present

## 2014-09-12 NOTE — Therapy (Signed)
[facility] [place], [place], [address] Phone: [phone]   Fax:  [phone]  Physical Therapy Treatment  Patient Details  Name: Ashley Peters MRN: [id number] Date of Birth: [birth year] Referring Provider:  Sharion Balloon, FNP  Encounter Date: 09/12/2014      PT End of Session - 09/12/14 1340    Visit Number 6   Number of Visits 12   Date for PT Re-Evaluation 10/19/14   PT Start Time 5732   PT Stop Time 1358   PT Time Calculation (min) 44 min   Activity Tolerance Patient tolerated treatment well   Behavior During Therapy [facility] for tasks assessed/performed      Past Medical History  Diagnosis Date  . Hyperlipidemia   . Hypertension   . Osteopenia   . Diverticulosis   . GERD (gastroesophageal reflux disease)     Past Surgical History  Procedure Laterality Date  . Cholecystectomy    . Abdominal hysterectomy    . Brest biopsy  1971  . Ablation tr great saphenous vein  10/17/2009  . Lt. distal ureteral stone extraction  09/14/1991    There were no vitals filed for this visit.  Visit Diagnosis:  Left low back pain, with sciatica presence unspecified      Subjective Assessment - 09/12/14 1328    Subjective no LE symptoms today no pain   Currently in Pain? No/denies                         [facility] Adult PT Treatment/Exercise - 09/12/14 0001    Lumbar Exercises: Aerobic   Stationary Bike Nustep L3 x 26min posture focus   Lumbar Exercises: Standing   Other Standing Lumbar Exercises Pink XTS for scap retraction and ext 2x10 eacch   Other Standing Lumbar Exercises 2# reachouts and D1/D2 with drawins 2x10 each   Lumbar Exercises: Seated   Other Seated Lumbar Exercises sit to stand with no UE support x10   Other Seated Lumbar Exercises picking up and putting down swiss ball with correct posture 2x10   Lumbar Exercises: Supine   Ab Set 20 reps;5 seconds   Bent Knee Raise 20 reps;3 seconds   Bridge 3 seconds;20 reps   Straight Leg Raise 20 reps;3 seconds                  PT Short Term Goals - 09/07/14 1346    PT SHORT TERM GOAL #1   Title Ind with HEP.   Time 2   Period Weeks   Status Achieved           PT Long Term Goals - 09/07/14 1346    PT LONG TERM GOAL #1   Title Perform ADL's with pain not > 3/10.   Time 4   Period Weeks   Status On-going   PT LONG TERM GOAL #2   Title Eliminate left LE symptoms.   Time 4   Period Weeks   Status Achieved               Plan - 09/12/14 1342    Clinical Impression Statement Patient continues to progress with all activities and feels 75% better overall. patient has not had any pain reports with the light ADL's. Patient is close to meeting all current goals. Goal ongoing due to not doing full ADL's at this time.   Pt will benefit from skilled therapeutic intervention in order to improve on the following  deficits Pain;Decreased activity tolerance   Rehab Potential Excellent   PT Frequency 3x / week   PT Duration 4 weeks   PT Treatment/Interventions ADLs/Self Care Home Management;Electrical Stimulation;Moist Heat;Ultrasound;Therapeutic exercise;Therapeutic activities;Patient/family education;Manual techniques   PT Next Visit Plan Continue per MPT POC for posture and core strengthening   Consulted and Agree with Plan of Care Patient        Problem List Patient Active Problem List   Diagnosis Date Noted  . HTN (hypertension) 07/15/2010  . GERD (gastroesophageal reflux disease) 07/15/2010  . Hyperlipemia 07/15/2010  . Osteopenia 07/15/2010  . Diverticulosis 07/15/2010    Phillips Climes, PTA 09/12/2014, 1:59 PM  [facility] [address] [place], [place], [address] Phone: [phone]   Fax:  [phone]

## 2014-09-14 ENCOUNTER — Ambulatory Visit: Payer: Medicare Other | Admitting: Physical Therapy

## 2014-09-14 ENCOUNTER — Encounter: Payer: Self-pay | Admitting: Physical Therapy

## 2014-09-14 DIAGNOSIS — M545 Low back pain: Secondary | ICD-10-CM

## 2014-09-14 NOTE — Therapy (Signed)
[facility] [place], [place], [address] Phone: [phone]   Fax:  [phone]  Physical Therapy Treatment  Patient Details  Name: Ashley Peters MRN: [id number] Date of Birth: [birth year] Referring Provider:  Sharion Balloon, FNP  Encounter Date: 09/14/2014      PT End of Session - 09/14/14 1327    Visit Number 7   Number of Visits 12   Date for PT Re-Evaluation 10/19/14   PT Start Time 4401   PT Stop Time 1355   PT Time Calculation (min) 40 min   Activity Tolerance Patient tolerated treatment well   Behavior During Therapy [facility] for tasks assessed/performed      Past Medical History  Diagnosis Date  . Hyperlipidemia   . Hypertension   . Osteopenia   . Diverticulosis   . GERD (gastroesophageal reflux disease)     Past Surgical History  Procedure Laterality Date  . Cholecystectomy    . Abdominal hysterectomy    . Brest biopsy  1971  . Ablation tr great saphenous vein  10/17/2009  . Lt. distal ureteral stone extraction  09/14/1991    There were no vitals filed for this visit.  Visit Diagnosis:  Left low back pain, with sciatica presence unspecified      Subjective Assessment - 09/14/14 1326    Subjective no LE symptoms and no pain today   Currently in Pain? No/denies                         Ashley Peters - 09/14/14 0001    Lumbar Exercises: Aerobic   Stationary Bike Nustep L3 x 31mn posture focus   Lumbar Exercises: Standing   Other Standing Lumbar Exercises Pink XTS for scap retraction and ext 2x10 eacch   Other Standing Lumbar Exercises 2# reachouts and D1/D2 with drawins 2x10 each   Lumbar Exercises: Seated   Other Seated Lumbar Exercises sit to stand with no UE support x10   Lumbar Exercises: Supine   Ab Set 20 reps;5 seconds   Bent Knee Raise 20 reps;3 seconds   Bridge 3 seconds;20 reps   Straight Leg Raise 20 reps;3 seconds                PT  Education - 09/14/14 1334    Education provided Yes   Education Details HEP   Person(s) Educated Patient   Methods Explanation;Demonstration   Comprehension Verbalized understanding;Returned demonstration          PT Short Term Goals - 09/07/14 1346    PT SHORT TERM GOAL #1   Title Ind with HEP.   Time 2   Period Weeks   Status Achieved           PT Long Term Goals - 09/14/14 1330    PT LONG TERM GOAL #1   Title Perform ADL's with pain not > 3/10.   Time 4   Period Weeks   Status Achieved   PT LONG TERM GOAL #2   Title Eliminate left LE symptoms.   Time 4   Period Weeks   Status Achieved               Plan - 09/14/14 1343    Clinical Impression Statement Patient has met all current goals and will DC to HEP today. Patient has has no symptoms and is independent with all activities. FOTO current limitation 29% (initial 42%)   Pt will benefit from  skilled therapeutic intervention in order to improve on the following deficits Pain;Decreased activity tolerance   Rehab Potential Excellent   PT Frequency 3x / week   PT Duration 4 weeks   PT Treatment/Interventions ADLs/Self Care Home Management;Electrical Stimulation;Moist Heat;Ultrasound;Therapeutic exercise;Therapeutic activities;Patient/family education;Manual techniques   PT Next Visit Plan DC per patient request   Consulted and Agree with Plan of Care Patient        Problem List Patient Active Problem List   Diagnosis Date Noted  . HTN (hypertension) 07/15/2010  . GERD (gastroesophageal reflux disease) 07/15/2010  . Hyperlipemia 07/15/2010  . Osteopenia 07/15/2010  . Diverticulosis 07/15/2010   Ladean Raya, PTA 09/14/2014 1:55 PM  Rayel Santizo P, PTA 09/14/2014, 1:55 PM  [facility] [address] [place], [place], [address] Phone: [phone]   Fax:  [phone]

## 2014-09-14 NOTE — Patient Instructions (Addendum)
  Scapular Retraction: Bilateral   Facing anchor, pull arms back, bringing shoulder blades together. Repeat _30___ times per set. Do __1-2__ sets per session. Do _1-2___ sessions per day.   Lumbar Rotation: with Side-Bend - Bolster (Side-Lying)   On right side, bolster at hip crest level, top leg bent _90+___ degrees, rotate upper torso back until stretch is felt. Hold _10___ min. Relax. Repeat _1___ times per set. Do _1___ sets per session. Do __1__ sessions per day.  http://orth.exer.us/1016   Copyright  VHI. All rights reserved.

## 2014-09-17 NOTE — Therapy (Signed)
[facility] [place], [place], [address] Phone: [phone]   Fax:  [phone]  Physical Therapy Treatment  Patient Details  Name: Ashley Peters MRN: [id number] Date of Birth: Jul 09, 1928 Referring Provider:  Sharion Balloon, FNP  Encounter Date: 09/14/2014    Past Medical History  Diagnosis Date  . Hyperlipidemia   . Hypertension   . Osteopenia   . Diverticulosis   . GERD (gastroesophageal reflux disease)     Past Surgical History  Procedure Laterality Date  . Cholecystectomy    . Abdominal hysterectomy    . Brest biopsy  1971  . Ablation tr great saphenous vein  10/17/2009  . Lt. distal ureteral stone extraction  09/14/1991    There were no vitals filed for this visit.  Visit Diagnosis:  Left low back pain, with sciatica presence unspecified                                 PT Short Term Goals - 09/07/14 1346    PT SHORT TERM GOAL #1   Title Ind with HEP.   Time 2   Period Weeks   Status Achieved           PT Long Term Goals - 09/14/14 1330    PT LONG TERM GOAL #1   Title Perform ADL's with pain not > 3/10.   Time 4   Period Weeks   Status Achieved   PT LONG TERM GOAL #2   Title Eliminate left LE symptoms.   Time 4   Period Weeks   Status Achieved               Problem List Patient Active Problem List   Diagnosis Date Noted  . HTN (hypertension) 07/15/2010  . GERD (gastroesophageal reflux disease) 07/15/2010  . Hyperlipemia 07/15/2010  . Osteopenia 07/15/2010  . Diverticulosis 07/15/2010   PHYSICAL THERAPY DISCHARGE SUMMARY  Visits from Start of Care:   Current functional level related to goals / functional outcomes: Please see above.   Remaining deficits: All goals met.   Edulcation / Equipment: HEP  Plan: Patient agrees to discharge.  Patient goals were met. Patient is being discharged due to meeting the stated rehab goals.  ?????       Honestie Kulik, [place] MPT 09/17/2014, 3:26 PM  [facility] [address] [place], [place], [address] Phone: [phone]   Fax:  [phone]

## 2014-10-25 ENCOUNTER — Ambulatory Visit: Payer: Medicare Other | Admitting: Family Medicine

## 2014-11-01 ENCOUNTER — Ambulatory Visit (INDEPENDENT_AMBULATORY_CARE_PROVIDER_SITE_OTHER): Payer: Medicare Other | Admitting: Family Medicine

## 2014-11-01 ENCOUNTER — Encounter: Payer: Self-pay | Admitting: Family Medicine

## 2014-11-01 ENCOUNTER — Encounter (INDEPENDENT_AMBULATORY_CARE_PROVIDER_SITE_OTHER): Payer: Self-pay

## 2014-11-01 VITALS — BP 85/59 | HR 72 | Temp 97.3°F | Ht 60.0 in | Wt 146.0 lb

## 2014-11-01 DIAGNOSIS — I10 Essential (primary) hypertension: Secondary | ICD-10-CM | POA: Diagnosis not present

## 2014-11-01 DIAGNOSIS — E785 Hyperlipidemia, unspecified: Secondary | ICD-10-CM | POA: Diagnosis not present

## 2014-11-01 MED ORDER — HYDROCHLOROTHIAZIDE 12.5 MG PO TABS: 12.5000 mg | ORAL_TABLET | Freq: Every day | ORAL | Status: DC

## 2014-11-01 NOTE — Progress Notes (Signed)
BP 85/59 mmHg  Pulse 72  Temp(Src) 97.3 F (36.3 C) (Oral)  Ht 5' (1.524 m)  Wt 146 lb (66.225 kg)  BMI 28.51 kg/m2   Subjective:    Patient ID: Ashley Peters, female    DOB: 03/22/1928, 81 y.o.   MRN: [id number]  HPI: Ashley Peters is a 81 y.o. female presenting on 11/01/2014 for Blood pressure followup; Labwork; and Medication Refill   HPI Hypertension Patient presents today for hypertension recheck. Her blood pressure today is 85/59. She denies any lightheadedness or dizziness or difficulties with standing up quickly. Usually her blood pressure runs on the higher side such as 145/70 at the last visit and 152/88 on the visit before. This is concerning for fall risk. She's never had any falls before. She denies any chest pain, blurred vision.  Hyperlipidemia Patient presents today for a hyperlipidemia recheck, she continues to take her TriCor. She denies any muscle aches, skin changes, stomach issues.  Relevant past medical, surgical, family and social history reviewed and updated as indicated. Interim medical history since our last visit reviewed. Allergies and medications reviewed and updated.  Review of Systems  Constitutional: Negative for fever and chills.  HENT: Negative for congestion, ear discharge and ear pain.   Eyes: Negative for redness and visual disturbance.  Respiratory: Negative for chest tightness and shortness of breath.   Cardiovascular: Negative for chest pain and leg swelling.  Endocrine: Negative for cold intolerance and heat intolerance.  Genitourinary: Negative for dysuria and difficulty urinating.  Musculoskeletal: Negative for back pain and gait problem.  Skin: Negative for rash.  Neurological: Negative for dizziness, weakness, light-headedness, numbness and headaches.  Psychiatric/Behavioral: Negative for behavioral problems and agitation.  All other systems reviewed and are negative.   Per HPI unless specifically indicated above       Medication List       This list is accurate as of: 11/01/14  9:15 AM.  Always use your most recent med list.               aspirin 81 MG EC tablet  Take 81 mg by mouth daily.     Calcium Citrate-Vitamin D 500-250 MG-UNIT Pack  Take by mouth. One bid     carvedilol 12.5 MG tablet  Commonly known as:  COREG  TAKE 1 TABLET DAILY     felodipine 5 MG 24 hr tablet  Commonly known as:  PLENDIL  TAKE 1 TABLET DAILY     fenofibrate 145 MG tablet  Commonly known as:  TRICOR  Take 1 tablet (145 mg total) by mouth daily.     hydrochlorothiazide 12.5 MG tablet  Commonly known as:  HYDRODIURIL  Take 1 tablet (12.5 mg total) by mouth daily.     ibandronate 150 MG tablet  Commonly known as:  BONIVA  Take 150 mg by mouth every 30 (thirty) days. Take in the morning with a full glass of water, on an empty stomach, and do not take anything else by mouth or lie down for the next 30 min. Takes on the 30th of each month.           Objective:    BP 85/59 mmHg  Pulse 72  Temp(Src) 97.3 F (36.3 C) (Oral)  Ht 5' (1.524 m)  Wt 146 lb (66.225 kg)  BMI 28.51 kg/m2  Wt Readings from Last 3 Encounters:  11/01/14 146 lb (66.225 kg)  08/14/14 148 lb (67.132 kg)  08/01/14 151 lb (68.493 kg)  Physical Exam  Constitutional: She is oriented to person, place, and time. She appears well-developed and well-nourished. No distress.  Eyes: Conjunctivae and EOM are normal. Pupils are equal, round, and reactive to light.  Neck: No thyromegaly present.  Cardiovascular: Normal rate, regular rhythm, normal heart sounds and intact distal pulses.   No murmur heard. Pulmonary/Chest: Effort normal and breath sounds normal. No respiratory distress. She has no wheezes.  Musculoskeletal: Normal range of motion. She exhibits no edema or tenderness.  Lymphadenopathy:    She has no cervical adenopathy.  Neurological: She is alert and oriented to person, place, and time. Coordination normal.  Skin: Skin is  warm and dry. No rash noted. She is not diaphoretic.  Psychiatric: She has a normal mood and affect. Her behavior is normal.  Vitals reviewed.   Results for orders placed or performed in visit on 08/14/14  CMP14+EGFR  Result Value Ref Range   Glucose 97 65 - 99 mg/dL   BUN 23 8 - 27 mg/dL   Creatinine, Ser 0.83 0.57 - 1.00 mg/dL   GFR calc non Af Amer 65 >59 mL/min/1.73   GFR calc Af Amer 75 >59 mL/min/1.73   BUN/Creatinine Ratio 28 (H) 11 - 26   Sodium 141 134 - 144 mmol/L   Potassium 4.3 3.5 - 5.2 mmol/L   Chloride 103 97 - 108 mmol/L   CO2 22 18 - 29 mmol/L   Calcium 9.7 8.7 - 10.3 mg/dL   Total Protein 6.1 6.0 - 8.5 g/dL   Albumin 3.8 3.5 - 4.7 g/dL   Globulin, Total 2.3 1.5 - 4.5 g/dL   Albumin/Globulin Ratio 1.7 1.1 - 2.5   Bilirubin Total 0.3 0.0 - 1.2 mg/dL   Alkaline Phosphatase 43 39 - 117 IU/L   AST 14 0 - 40 IU/L   ALT 14 0 - 32 IU/L      Assessment & Plan:   Problem List Items Addressed This Visit      Cardiovascular and Mediastinum   HTN (hypertension) - Primary    Patient's blood pressure is low today 85/59. She denies any lightheadedness or dizziness or syncope. She denies any difficulty standing up quickly. We will lower her hydrochlorothiazide 25 to 12.5 today. I want see her back in 4 weeks, but I also want her to do some home blood pressure readings and document those and bring them with her.      Relevant Medications   hydrochlorothiazide (HYDRODIURIL) 12.5 MG tablet   Other Relevant Orders   BMP8+EGFR     Other   Hyperlipidemia    Patient due for recheck of hyperlipidemia. She continues to take TriCor without any side effects. Check labs today      Relevant Medications   hydrochlorothiazide (HYDRODIURIL) 12.5 MG tablet   Other Relevant Orders   Lipid panel       Follow up plan: Return in about 4 weeks (around 11/29/2014).  Caryl Pina, MD [facility] 11/01/2014, 9:15 AM

## 2014-11-01 NOTE — Assessment & Plan Note (Signed)
Patient due for recheck of hyperlipidemia. She continues to take TriCor without any side effects. Check labs today

## 2014-11-01 NOTE — Patient Instructions (Signed)

## 2014-11-01 NOTE — Assessment & Plan Note (Signed)
Patient's blood pressure is low today 85/59. She denies any lightheadedness or dizziness or syncope. She denies any difficulty standing up quickly. We will lower her hydrochlorothiazide 25 to 12.5 today. I want see her back in 4 weeks, but I also want her to do some home blood pressure readings and document those and bring them with her.

## 2014-11-02 LAB — LIPID PANEL
Chol/HDL Ratio: 3.8 ratio units (ref 0.0–4.4)
Cholesterol, Total: 165 mg/dL (ref 100–199)
HDL: 43 mg/dL (ref >39)
LDL Calculated: 59 mg/dL (ref 0–99)
Triglycerides: 316 mg/dL — ABNORMAL HIGH (ref 0–149)
VLDL Cholesterol Cal: 63 mg/dL — ABNORMAL HIGH (ref 5–40)

## 2014-11-02 LAB — BMP8+EGFR
BUN / CREAT RATIO: 29 — AB (ref 11–26)
BUN: 25 mg/dL (ref 8–27)
CO2: 22 mmol/L (ref 18–29)
CREATININE: 0.85 mg/dL (ref 0.57–1.00)
Calcium: 9.4 mg/dL (ref 8.7–10.3)
Chloride: 100 mmol/L (ref 97–108)
GFR calc non Af Amer: 63 mL/min/{1.73_m2} (ref >59)
GFR, EST AFRICAN AMERICAN: 73 mL/min/{1.73_m2} (ref >59)
Glucose: 115 mg/dL — ABNORMAL HIGH (ref 65–99)
Potassium: 4.2 mmol/L (ref 3.5–5.2)
SODIUM: 138 mmol/L (ref 134–144)

## 2014-11-27 ENCOUNTER — Other Ambulatory Visit: Payer: Self-pay

## 2014-11-27 DIAGNOSIS — Z1231 Encounter for screening mammogram for malignant neoplasm of breast: Secondary | ICD-10-CM

## 2014-11-29 ENCOUNTER — Ambulatory Visit (INDEPENDENT_AMBULATORY_CARE_PROVIDER_SITE_OTHER): Payer: Medicare Other | Admitting: Family Medicine

## 2014-11-29 ENCOUNTER — Encounter: Payer: Self-pay | Admitting: Family Medicine

## 2014-11-29 VITALS — BP 132/76 | HR 70 | Temp 97.1°F | Ht 60.0 in | Wt 144.8 lb

## 2014-11-29 DIAGNOSIS — R739 Hyperglycemia, unspecified: Secondary | ICD-10-CM

## 2014-11-29 DIAGNOSIS — I10 Essential (primary) hypertension: Secondary | ICD-10-CM

## 2014-11-29 LAB — POCT GLYCOSYLATED HEMOGLOBIN (HGB A1C): HEMOGLOBIN A1C: 5.8

## 2014-11-29 NOTE — Patient Instructions (Signed)

## 2014-11-29 NOTE — Assessment & Plan Note (Signed)
Patient had low blood pressures and we cut the hydrochlorothiazide in half and it seems to be a lot more stable. She is not having any symptoms anymore of lightheadedness or dizziness.

## 2014-11-29 NOTE — Progress Notes (Signed)
BP 132/76 mmHg  Pulse 70  Temp(Src) 97.1 F (36.2 C) (Oral)  Ht 5' (1.524 m)  Wt 144 lb 12.8 oz (65.681 kg)  BMI 28.28 kg/m2   Subjective:    Patient ID: Ashley Peters, female    DOB: [birth year], 81 y.o.   MRN: [id number]  HPI: Ashley Peters is a 81 y.o. female presenting on 11/29/2014 for Hypotension; Hyperglycemia; and Prescription refills   HPI Hypertension Patient had been having low blood pressures and lightheadedness and dizziness. They have been resolved since we lowered her hydrochlorothiazide from 25 to 12-1/2. Her home blood pressures are running in the 130s now instead of below 100. Her blood pressure today is about the same systolic blood pressure 017.  Elevated fasting blood sugar Fasting blood sugar of 115 and had come back for a hemoglobin A1c which came back as 5.8. Discussed diet and exercise changes to prevent her from becoming an diabetic.  Relevant past medical, surgical, family and social history reviewed and updated as indicated. Interim medical history since our last visit reviewed. Allergies and medications reviewed and updated.  Review of Systems  Constitutional: Negative for fever and chills.  HENT: Negative for congestion, ear discharge and ear pain.   Eyes: Negative for redness and visual disturbance.  Respiratory: Negative for chest tightness and shortness of breath.   Cardiovascular: Negative for chest pain and leg swelling.  Genitourinary: Negative for dysuria and difficulty urinating.  Musculoskeletal: Negative for back pain and gait problem.  Skin: Negative for rash.  Neurological: Negative for dizziness, light-headedness and headaches.  Psychiatric/Behavioral: Negative for behavioral problems and agitation.  All other systems reviewed and are negative.   Per HPI unless specifically indicated above     Medication List       This list is accurate as of: 11/29/14  8:25 AM.  Always use your most recent med list.               aspirin 81 MG EC tablet  Take 81 mg by mouth daily.     Calcium Citrate-Vitamin D 500-250 MG-UNIT Pack  Take by mouth. One bid     carvedilol 12.5 MG tablet  Commonly known as:  COREG  TAKE 1 TABLET DAILY     felodipine 5 MG 24 hr tablet  Commonly known as:  PLENDIL  TAKE 1 TABLET DAILY     fenofibrate 145 MG tablet  Commonly known as:  TRICOR  Take 1 tablet (145 mg total) by mouth daily.     hydrochlorothiazide 12.5 MG tablet  Commonly known as:  HYDRODIURIL  Take 1 tablet (12.5 mg total) by mouth daily.     ibandronate 150 MG tablet  Commonly known as:  BONIVA  Take 150 mg by mouth every 30 (thirty) days. Take in the morning with a full glass of water, on an empty stomach, and do not take anything else by mouth or lie down for the next 30 min. Takes on the 30th of each month.           Objective:    BP 132/76 mmHg  Pulse 70  Temp(Src) 97.1 F (36.2 C) (Oral)  Ht 5' (1.524 m)  Wt 144 lb 12.8 oz (65.681 kg)  BMI 28.28 kg/m2  Wt Readings from Last 3 Encounters:  11/29/14 144 lb 12.8 oz (65.681 kg)  11/01/14 146 lb (66.225 kg)  08/14/14 148 lb (67.132 kg)    Physical Exam  Constitutional: She is oriented to person, place, and  time. She appears well-developed and well-nourished. No distress.  Eyes: Conjunctivae and EOM are normal.  Neck: Neck supple. No thyromegaly present.  Cardiovascular: Normal rate, regular rhythm, normal heart sounds and intact distal pulses.   No murmur heard. Pulmonary/Chest: Effort normal and breath sounds normal. No respiratory distress. She has no wheezes.  Musculoskeletal: She exhibits no edema.  Lymphadenopathy:    She has no cervical adenopathy.  Neurological: She is alert and oriented to person, place, and time. Coordination normal.  Skin: Skin is warm and dry. No rash noted. She is not diaphoretic.  Psychiatric: She has a normal mood and affect. Her behavior is normal.  Vitals reviewed.   Results for orders placed or performed  in visit on 11/29/14  POCT glycosylated hemoglobin (Hb A1C)  Result Value Ref Range   Hemoglobin A1C 5.8       Assessment & Plan:   Problem List Items Addressed This Visit      Cardiovascular and Mediastinum   HTN (hypertension)    Patient had low blood pressures and we cut the hydrochlorothiazide in half and it seems to be a lot more stable. She is not having any symptoms anymore of lightheadedness or dizziness.       Other Visit Diagnoses    Hyperglycemia    -  Primary    Patient had elevated fasting blood sugar of 115. A1c today is 5.8. Discussed diet and exercise to prevent from becoming a diabetic.    Relevant Orders    POCT glycosylated hemoglobin (Hb A1C) (Completed)        Follow up plan: Return in about 4 days (around 12/03/2014), or if symptoms worsen or fail to improve, for Already has appointment for Medicare wellness.  Caryl Pina, MD [facility] 11/29/2014, 8:25 AM

## 2014-12-04 ENCOUNTER — Encounter: Payer: Self-pay | Admitting: Family Medicine

## 2014-12-04 ENCOUNTER — Ambulatory Visit (INDEPENDENT_AMBULATORY_CARE_PROVIDER_SITE_OTHER): Payer: Medicare Other | Admitting: Family Medicine

## 2014-12-04 VITALS — BP 132/84 | HR 78 | Temp 98.0°F | Ht 60.0 in | Wt 146.8 lb

## 2014-12-04 DIAGNOSIS — Z0000 Encounter for general adult medical examination without abnormal findings: Secondary | ICD-10-CM | POA: Diagnosis not present

## 2014-12-04 MED ORDER — ASPIRIN 81 MG PO TBEC: 81.0000 mg | DELAYED_RELEASE_TABLET | Freq: Every day | ORAL | Status: DC

## 2014-12-04 MED ORDER — HYDROCHLOROTHIAZIDE 12.5 MG PO TABS: 12.5000 mg | ORAL_TABLET | Freq: Every day | ORAL | Status: DC

## 2014-12-04 MED ORDER — CARVEDILOL 12.5 MG PO TABS: 12.5000 mg | ORAL_TABLET | Freq: Every day | ORAL | Status: DC

## 2014-12-04 MED ORDER — FENOFIBRATE 145 MG PO TABS: 145.0000 mg | ORAL_TABLET | Freq: Every day | ORAL | Status: DC

## 2014-12-04 MED ORDER — IBANDRONATE SODIUM 150 MG PO TABS: 150.0000 mg | ORAL_TABLET | ORAL | Status: DC

## 2014-12-04 MED ORDER — FELODIPINE ER 5 MG PO TB24: 5.0000 mg | ORAL_TABLET | Freq: Every day | ORAL | Status: DC

## 2014-12-04 NOTE — Progress Notes (Signed)
Subjective:   Ashley Peters is a 81 y.o. female who presents for Medicare Annual (Subsequent) preventive examination.  Review of Systems:  Review of Systems  Constitutional: Positive for malaise/fatigue. Negative for fever and chills.  HENT: Positive for congestion and sore throat. Negative for ear pain and tinnitus.   Eyes: Negative for blurred vision and pain.  Respiratory: Positive for cough, sputum production (scanty whitish) and shortness of breath. Negative for wheezing.   Cardiovascular: Positive for chest pain (Chest tightness with coughing). Negative for palpitations and leg swelling.  Gastrointestinal: Negative for abdominal pain, diarrhea, constipation, blood in stool and melena.  Genitourinary: Negative for dysuria and hematuria.  Musculoskeletal: Negative for myalgias, back pain and joint pain.  Skin: Negative for rash.  Neurological: Negative for dizziness, sensory change, focal weakness, weakness and headaches.  Psychiatric/Behavioral: Negative for depression and suicidal ideas.     Cardiac Risk Factors include: advanced age (>72men, >66 women);dyslipidemia;hypertension     Objective:     Vitals: BP 132/84 mmHg  Pulse 78  Temp(Src) 98 F (36.7 C) (Oral)  Ht 5' (1.524 m)  Wt 146 lb 12.8 oz (66.588 kg)  BMI 28.67 kg/m2  Tobacco History  Smoking status  . Never Smoker   Smokeless tobacco  . Not on file     Counseling given: Not Answered   Past Medical History  Diagnosis Date  . Hyperlipidemia   . Hypertension   . Osteopenia   . Diverticulosis   . GERD (gastroesophageal reflux disease)    Past Surgical History  Procedure Laterality Date  . Cholecystectomy    . Abdominal hysterectomy    . Brest biopsy  1971  . Ablation tr great saphenous vein  10/17/2009  . Lt. distal ureteral stone extraction  09/14/1991   Family History  Problem Relation Age of Onset  . Stroke Mother   . Stroke Father    History  Sexual Activity  . Sexual Activity:  Not on file    Outpatient Encounter Prescriptions as of 12/04/2014  Medication Sig  . aspirin 81 MG EC tablet Take 1 tablet (81 mg total) by mouth daily.  . Calcium Citrate-Vitamin D 500-250 MG-UNIT PACK Take by mouth. One bid    . carvedilol (COREG) 12.5 MG tablet Take 1 tablet (12.5 mg total) by mouth daily.  . felodipine (PLENDIL) 5 MG 24 hr tablet Take 1 tablet (5 mg total) by mouth daily.  . fenofibrate (TRICOR) 145 MG tablet Take 1 tablet (145 mg total) by mouth daily.  . hydrochlorothiazide (HYDRODIURIL) 12.5 MG tablet Take 1 tablet (12.5 mg total) by mouth daily. Take 1/2 tablet daily  . ibandronate (BONIVA) 150 MG tablet Take 1 tablet (150 mg total) by mouth every 30 (thirty) days. Take in the morning with a full glass of water, on an empty stomach, and do not take anything else by mouth or lie down for the next 30 min. Takes on the 30th of each month.  . [DISCONTINUED] aspirin 81 MG EC tablet Take 81 mg by mouth daily.    . [DISCONTINUED] carvedilol (COREG) 12.5 MG tablet TAKE 1 TABLET DAILY  . [DISCONTINUED] felodipine (PLENDIL) 5 MG 24 hr tablet TAKE 1 TABLET DAILY  . [DISCONTINUED] fenofibrate (TRICOR) 145 MG tablet Take 1 tablet (145 mg total) by mouth daily.  . [DISCONTINUED] hydrochlorothiazide (HYDRODIURIL) 12.5 MG tablet Take 1 tablet (12.5 mg total) by mouth daily. (Patient taking differently: Take 12.5 mg by mouth daily. Take 1/2 tablet daily)  . [DISCONTINUED]  ibandronate (BONIVA) 150 MG tablet Take 150 mg by mouth every 30 (thirty) days. Take in the morning with a full glass of water, on an empty stomach, and do not take anything else by mouth or lie down for the next 30 min. Takes on the 30th of each month.   No facility-administered encounter medications on file as of 12/04/2014.    Activities of Daily Living In your present state of health, do you have any difficulty performing the following activities: 12/04/2014  Hearing? N  Vision? N  Difficulty concentrating or  making decisions? N  Walking or climbing stairs? N  Dressing or bathing? N  Doing errands, shopping? N  Preparing Food and eating ? N  Using the Toilet? N  In the past six months, have you accidently leaked urine? N  Do you have problems with loss of bowel control? N  Managing your Medications? N  Managing your Finances? N  Housekeeping or managing your Housekeeping? N    Patient Care Team: Sharion Balloon, FNP as PCP - General (Nurse Practitioner) Clent Jacks, MD as Consulting Physician (Ophthalmology)    Assessment:    Problem List Items Addressed This Visit    None    Visit Diagnoses    Routine history and physical examination of adult    -  Primary    No major issues, doing very well.       Exercise Activities and Dietary recommendations Current Exercise Habits:: The patient does not participate in regular exercise at present  Goals    None     Fall Risk Fall Risk  12/04/2014 11/29/2014 11/01/2014  Falls in the past year? No No No   Depression Screen PHQ 2/9 Scores 12/04/2014 11/29/2014 11/01/2014  PHQ - 2 Score 0 0 0     Cognitive Testing MMSE - Mini Mental State Exam 12/04/2014  Orientation to time 5  Orientation to Place 5  Registration 3  Attention/ Calculation 5  Recall 2  Language- name 2 objects 2  Language- repeat 1  Language- follow 3 step command 3  Language- read & follow direction 1  Write a sentence 1  Copy design 1  Total score 29    Immunization History  Administered Date(s) Administered  . DTaP 11/09/2003  . Influenza Whole 12/10/2009  . Influenza,inj,Quad PF,36+ Mos 12/15/2012, 12/22/2013  . Pneumococcal Conjugate-13 03/16/2007   Screening Tests Health Maintenance  Topic Date Due  . INFLUENZA VACCINE  12/18/2014 (Originally 10/09/2014)  . DEXA SCAN  05/04/2015 (Originally 08/27/1995)  . TETANUS/TDAP  11/01/2015 (Originally 11/08/2013)  . PNA vac Low Risk Adult (2 of 2 - PPSV23) 11/01/2015 (Originally 03/15/2008)  . ZOSTAVAX  Completed        Plan:    see assessment, also gave medication refills.  During the course of the visit the patient was educated and counseled about the following appropriate screening and preventive services:   Vaccines to include Pneumoccal, Influenza, Hepatitis B, Td, Zostavax, HCV  Electrocardiogram  Cardiovascular Disease  Colorectal cancer screening  Bone density screening  Diabetes screening  Glaucoma screening  Mammography/PAP  Nutrition counseling   Patient Instructions (the written plan) was given to the patient.   Worthy Rancher, MD  12/04/2014

## 2014-12-05 ENCOUNTER — Telehealth: Payer: Self-pay | Admitting: Family Medicine

## 2014-12-05 NOTE — Telephone Encounter (Signed)
Pharmacy is calling to verify dosage of HCTZ can you please advise?

## 2014-12-06 NOTE — Telephone Encounter (Signed)
Spoke with pharmacist at [facility] and told them correct dosage per Dr. Warrick Parisian is 1/2 tablet daily.

## 2014-12-06 NOTE — Telephone Encounter (Signed)
She is taking a half of a 12.5 mg tablet. Caryl Pina, MD [facility] 12/06/2014, 2:58 PM

## 2014-12-13 ENCOUNTER — Ambulatory Visit (INDEPENDENT_AMBULATORY_CARE_PROVIDER_SITE_OTHER): Payer: Medicare Other

## 2014-12-13 DIAGNOSIS — Z23 Encounter for immunization: Secondary | ICD-10-CM | POA: Diagnosis not present

## 2014-12-21 ENCOUNTER — Ambulatory Visit
Admission: RE | Admit: 2014-12-21 | Discharge: 2014-12-21 | Disposition: A | Discharge status: Home / Self Care | Payer: Medicare Other | Source: Ambulatory Visit

## 2014-12-21 DIAGNOSIS — Z1231 Encounter for screening mammogram for malignant neoplasm of breast: Secondary | ICD-10-CM

## 2015-01-03 ENCOUNTER — Ambulatory Visit: Payer: Medicare Other

## 2015-01-17 DIAGNOSIS — Z961 Presence of intraocular lens: Secondary | ICD-10-CM | POA: Diagnosis not present

## 2015-01-17 DIAGNOSIS — H353132 Nonexudative age-related macular degeneration, bilateral, intermediate dry stage: Secondary | ICD-10-CM | POA: Diagnosis not present

## 2015-01-23 DIAGNOSIS — H353211 Exudative age-related macular degeneration, right eye, with active choroidal neovascularization: Secondary | ICD-10-CM | POA: Diagnosis not present

## 2015-01-23 DIAGNOSIS — H353122 Nonexudative age-related macular degeneration, left eye, intermediate dry stage: Secondary | ICD-10-CM | POA: Diagnosis not present

## 2015-01-29 DIAGNOSIS — H353211 Exudative age-related macular degeneration, right eye, with active choroidal neovascularization: Secondary | ICD-10-CM | POA: Diagnosis not present

## 2015-03-19 ENCOUNTER — Other Ambulatory Visit: Payer: Self-pay | Admitting: Family Medicine

## 2015-03-19 DIAGNOSIS — H353132 Nonexudative age-related macular degeneration, bilateral, intermediate dry stage: Secondary | ICD-10-CM | POA: Diagnosis not present

## 2015-03-19 DIAGNOSIS — H353211 Exudative age-related macular degeneration, right eye, with active choroidal neovascularization: Secondary | ICD-10-CM | POA: Diagnosis not present

## 2015-04-23 DIAGNOSIS — H353211 Exudative age-related macular degeneration, right eye, with active choroidal neovascularization: Secondary | ICD-10-CM | POA: Diagnosis not present

## 2015-04-23 DIAGNOSIS — H353132 Nonexudative age-related macular degeneration, bilateral, intermediate dry stage: Secondary | ICD-10-CM | POA: Diagnosis not present

## 2015-05-09 NOTE — Therapy (Signed)
[facility] [place], [place], [address] Phone: [phone]   Fax:  [phone]  Physical Therapy Treatment  Patient Details  Name: Ashley Peters MRN: [id number] Date of Birth: March 27, 1928 No Data Recorded  Encounter Date: 09/14/2014    Past Medical History  Diagnosis Date  . Hyperlipidemia   . Hypertension   . Osteopenia   . Diverticulosis   . GERD (gastroesophageal reflux disease)     Past Surgical History  Procedure Laterality Date  . Cholecystectomy    . Abdominal hysterectomy    . Brest biopsy  1971  . Ablation tr great saphenous vein  10/17/2009  . Lt. distal ureteral stone extraction  09/14/1991    There were no vitals filed for this visit.  Visit Diagnosis:  Left low back pain, with sciatica presence unspecified                                 PT Short Term Goals - 09/07/14 1346    PT SHORT TERM GOAL #1   Title Ind with HEP.   Time 2   Period Weeks   Status Achieved           PT Long Term Goals - 09/14/14 1330    PT LONG TERM GOAL #1   Title Perform ADL's with pain not > 3/10.   Time 4   Period Weeks   Status Achieved   PT LONG TERM GOAL #2   Title Eliminate left LE symptoms.   Time 4   Period Weeks   Status Achieved               Problem List Patient Active Problem List   Diagnosis Date Noted  . Hyperlipidemia 11/01/2014  . HTN (hypertension) 07/15/2010  . GERD (gastroesophageal reflux disease) 07/15/2010  . Osteopenia 07/15/2010  . Diverticulosis 07/15/2010   PHYSICAL THERAPY DISCHARGE SUMMARY  Visits from Start of Care: 7  Current functional level related to goals / functional outcomes: Please see above.   Remaining deficits: All goals met.   Education / Equipment: HEP.  Plan: Patient agrees to discharge.  Patient goals were met. Patient is being discharged due to meeting the stated rehab goals.  ?????      [name], [place]  MPT 05/09/2015, 5:16 PM  [facility] [address] [place], [place], [address] Phone: [phone]   Fax:  [phone]  Name: Ashley Peters MRN: [id number] Date of Birth: Jul 21, 1928

## 2015-05-30 DIAGNOSIS — H353211 Exudative age-related macular degeneration, right eye, with active choroidal neovascularization: Secondary | ICD-10-CM | POA: Diagnosis not present

## 2015-06-04 ENCOUNTER — Ambulatory Visit: Payer: Medicare Other | Admitting: Family Medicine

## 2015-06-15 ENCOUNTER — Other Ambulatory Visit: Payer: Self-pay | Admitting: Family Medicine

## 2015-07-05 ENCOUNTER — Ambulatory Visit: Payer: Medicare Other | Admitting: Family Medicine

## 2015-07-11 DIAGNOSIS — H353132 Nonexudative age-related macular degeneration, bilateral, intermediate dry stage: Secondary | ICD-10-CM | POA: Diagnosis not present

## 2015-07-11 DIAGNOSIS — H353211 Exudative age-related macular degeneration, right eye, with active choroidal neovascularization: Secondary | ICD-10-CM | POA: Diagnosis not present

## 2015-07-23 ENCOUNTER — Other Ambulatory Visit: Payer: Self-pay | Admitting: Family Medicine

## 2015-07-23 ENCOUNTER — Ambulatory Visit (INDEPENDENT_AMBULATORY_CARE_PROVIDER_SITE_OTHER): Payer: Medicare Other

## 2015-07-23 ENCOUNTER — Encounter: Payer: Self-pay | Admitting: Family Medicine

## 2015-07-23 ENCOUNTER — Ambulatory Visit (INDEPENDENT_AMBULATORY_CARE_PROVIDER_SITE_OTHER): Payer: Medicare Other | Admitting: Family Medicine

## 2015-07-23 VITALS — BP 127/74 | HR 71 | Temp 97.2°F | Ht 60.0 in | Wt 150.0 lb

## 2015-07-23 DIAGNOSIS — M899 Disorder of bone, unspecified: Secondary | ICD-10-CM

## 2015-07-23 DIAGNOSIS — M8580 Other specified disorders of bone density and structure, unspecified site: Secondary | ICD-10-CM | POA: Diagnosis not present

## 2015-07-23 DIAGNOSIS — E785 Hyperlipidemia, unspecified: Secondary | ICD-10-CM

## 2015-07-23 DIAGNOSIS — R7309 Other abnormal glucose: Secondary | ICD-10-CM | POA: Diagnosis not present

## 2015-07-23 DIAGNOSIS — M810 Age-related osteoporosis without current pathological fracture: Secondary | ICD-10-CM | POA: Diagnosis not present

## 2015-07-23 DIAGNOSIS — I10 Essential (primary) hypertension: Secondary | ICD-10-CM | POA: Diagnosis not present

## 2015-07-23 DIAGNOSIS — R739 Hyperglycemia, unspecified: Secondary | ICD-10-CM

## 2015-07-23 MED ORDER — HYDROCHLOROTHIAZIDE 12.5 MG PO TABS: 12.5000 mg | ORAL_TABLET | Freq: Every day | ORAL | Status: DC

## 2015-07-23 MED ORDER — CARVEDILOL 12.5 MG PO TABS: 12.5000 mg | ORAL_TABLET | Freq: Every day | ORAL | Status: DC

## 2015-07-23 MED ORDER — FELODIPINE ER 5 MG PO TB24: 5.0000 mg | ORAL_TABLET | Freq: Every day | ORAL | Status: DC

## 2015-07-23 MED ORDER — IBANDRONATE SODIUM 150 MG PO TABS: 150.0000 mg | ORAL_TABLET | ORAL | Status: DC

## 2015-07-23 MED ORDER — FENOFIBRATE 145 MG PO TABS: 145.0000 mg | ORAL_TABLET | Freq: Every day | ORAL | Status: DC

## 2015-07-23 NOTE — Progress Notes (Signed)
BP 127/74 mmHg  Pulse 71  Temp(Src) 97.2 F (36.2 C) (Oral)  Ht 5' (1.524 m)  Wt 150 lb (68.04 kg)  BMI 29.30 kg/m2   Subjective:    Patient ID: Ashley Peters, female    DOB: 1929-03-07, 82 y.o.   MRN: [id number]  HPI: Ashley Peters is a 82 y.o. female presenting on 07/23/2015 for Hyperlipidemia; Hypertension; Medication Refill; and Discuss Boniva   HPI Hypertension Patient comes in today for hypertension checkup. Her blood pressure today is 127/74. She is currently taking hydrochlorothiazide and felodipine. Patient denies headaches, blurred vision, chest pains, shortness of breath, or weakness. Denies any side effects from medication and is content with current medication.   Hyperlipidemia Patient had very elevated cholesterol and specifically triglycerides on last check. She is currently taking TriCor 145. Eyes any issues with the medication.  Elevated blood sugar Patient's last blood sugar was slightly elevated and she had a hemoglobin A1c which turned up at 5.8. She's never been told she has diabetes before. Patient was instructed on what to watch for blood sugars and what to or what not to eat.  Osteopenia Patient has been diagnosed with osteopenia by DEXA scan and was started on Boniva. She says she never got the Boniva never started taking it. We do not know why that is the case as it clearly looks like it was since last visit. Her system. We will have to recently in.  Relevant past medical, surgical, family and social history reviewed and updated as indicated. Interim medical history since our last visit reviewed. Allergies and medications reviewed and updated.  Review of Systems  Constitutional: Negative for fever and chills.  HENT: Negative for congestion, ear discharge and ear pain.   Eyes: Negative for redness and visual disturbance.  Respiratory: Negative for chest tightness and shortness of breath.   Cardiovascular: Negative for chest pain and leg swelling.    Endocrine: Negative for cold intolerance and heat intolerance.  Genitourinary: Negative for dysuria and difficulty urinating.  Musculoskeletal: Negative for back pain and gait problem.  Skin: Negative for rash.  Neurological: Negative for dizziness, light-headedness and headaches.  Psychiatric/Behavioral: Negative for behavioral problems and agitation.  All other systems reviewed and are negative.   Per HPI unless specifically indicated above     Medication List       This list is accurate as of: 07/23/15  9:04 AM.  Always use your most recent med list.               aspirin 81 MG EC tablet  Take 1 tablet (81 mg total) by mouth daily.     Calcium Citrate-Vitamin D 500-250 MG-UNIT Pack  Take by mouth. One bid     carvedilol 12.5 MG tablet  Commonly known as:  COREG  Take 1 tablet (12.5 mg total) by mouth daily.     felodipine 5 MG 24 hr tablet  Commonly known as:  PLENDIL  Take 1 tablet (5 mg total) by mouth daily.     fenofibrate 145 MG tablet  Commonly known as:  TRICOR  Take 1 tablet (145 mg total) by mouth daily.     hydrochlorothiazide 12.5 MG tablet  Commonly known as:  HYDRODIURIL  Take 1 tablet (12.5 mg total) by mouth daily. Take 1/2 tablet daily     ibandronate 150 MG tablet  Commonly known as:  BONIVA  Take 1 tablet (150 mg total) by mouth every 30 (thirty) days. Take in the morning  with a full glass of water, on an empty stomach.           Objective:    BP 127/74 mmHg  Pulse 71  Temp(Src) 97.2 F (36.2 C) (Oral)  Ht 5' (1.524 m)  Wt 150 lb (68.04 kg)  BMI 29.30 kg/m2  Wt Readings from Last 3 Encounters:  07/23/15 150 lb (68.04 kg)  12/04/14 146 lb 12.8 oz (66.588 kg)  11/29/14 144 lb 12.8 oz (65.681 kg)    Physical Exam  Constitutional: She is oriented to person, place, and time. She appears well-developed and well-nourished. No distress.  Eyes: Conjunctivae and EOM are normal. Pupils are equal, round, and reactive to light.  Neck: Neck  supple. No thyromegaly present.  Cardiovascular: Normal rate, regular rhythm, normal heart sounds and intact distal pulses.   No murmur heard. Pulmonary/Chest: Effort normal and breath sounds normal. No respiratory distress. She has no wheezes.  Musculoskeletal: Normal range of motion. She exhibits no edema or tenderness.  Lymphadenopathy:    She has no cervical adenopathy.  Neurological: She is alert and oriented to person, place, and time. Coordination normal.  Skin: Skin is warm and dry. No rash noted. She is not diaphoretic.  Psychiatric: She has a normal mood and affect. Her behavior is normal.  Nursing note and vitals reviewed.   Results for orders placed or performed in visit on 11/29/14  POCT glycosylated hemoglobin (Hb A1C)  Result Value Ref Range   Hemoglobin A1C 5.8       Assessment & Plan:       Problem List Items Addressed This Visit      Cardiovascular and Mediastinum   HTN (hypertension) - Primary   Relevant Medications   fenofibrate (TRICOR) 145 MG tablet   hydrochlorothiazide (HYDRODIURIL) 12.5 MG tablet   felodipine (PLENDIL) 5 MG 24 hr tablet   carvedilol (COREG) 12.5 MG tablet   Other Relevant Orders   CMP14+EGFR     Musculoskeletal and Integument   Osteopenia   Relevant Medications   ibandronate (BONIVA) 150 MG tablet     Other   Hyperlipidemia   Relevant Medications   fenofibrate (TRICOR) 145 MG tablet   hydrochlorothiazide (HYDRODIURIL) 12.5 MG tablet   felodipine (PLENDIL) 5 MG 24 hr tablet   carvedilol (COREG) 12.5 MG tablet   Other Relevant Orders   Lipid panel       Follow up plan: Return in about 6 months (around 01/23/2016), or if symptoms worsen or fail to improve, for Recheck hypertension and cholesterol.  Counseling provided for all of the vaccine components Orders Placed This Encounter  Procedures  . CMP14+EGFR  . Lipid panel    Caryl Pina, MD [facility] 07/23/2015, 9:04 AM

## 2015-07-24 LAB — CMP14+EGFR
ALBUMIN: 4.4 g/dL (ref 3.5–4.7)
ALT: 20 IU/L (ref 0–32)
AST: 28 IU/L (ref 0–40)
Albumin/Globulin Ratio: 1.9 (ref 1.2–2.2)
Alkaline Phosphatase: 59 IU/L (ref 39–117)
BILIRUBIN TOTAL: 0.4 mg/dL (ref 0.0–1.2)
BUN / CREAT RATIO: 26 (ref 12–28)
BUN: 21 mg/dL (ref 8–27)
CALCIUM: 10.1 mg/dL (ref 8.7–10.3)
CHLORIDE: 101 mmol/L (ref 96–106)
CO2: 24 mmol/L (ref 18–29)
CREATININE: 0.81 mg/dL (ref 0.57–1.00)
GFR, EST AFRICAN AMERICAN: 77 mL/min/{1.73_m2} (ref >59)
GFR, EST NON AFRICAN AMERICAN: 67 mL/min/{1.73_m2} (ref >59)
GLUCOSE: 104 mg/dL — AB (ref 65–99)
Globulin, Total: 2.3 g/dL (ref 1.5–4.5)
Potassium: 5.2 mmol/L (ref 3.5–5.2)
Sodium: 139 mmol/L (ref 134–144)
TOTAL PROTEIN: 6.7 g/dL (ref 6.0–8.5)

## 2015-07-24 LAB — LIPID PANEL
Chol/HDL Ratio: 2.3 ratio units (ref 0.0–4.4)
Cholesterol, Total: 144 mg/dL (ref 100–199)
HDL: 62 mg/dL (ref >39)
LDL CALC: 61 mg/dL (ref 0–99)
Triglycerides: 106 mg/dL (ref 0–149)
VLDL CHOLESTEROL CAL: 21 mg/dL (ref 5–40)

## 2015-07-25 ENCOUNTER — Ambulatory Visit (INDEPENDENT_AMBULATORY_CARE_PROVIDER_SITE_OTHER): Payer: Medicare Other | Admitting: Pharmacist

## 2015-07-25 ENCOUNTER — Encounter: Payer: Self-pay | Admitting: Pharmacist

## 2015-07-25 VITALS — Ht 60.0 in | Wt 151.0 lb

## 2015-07-25 DIAGNOSIS — M8580 Other specified disorders of bone density and structure, unspecified site: Secondary | ICD-10-CM

## 2015-07-25 DIAGNOSIS — R7309 Other abnormal glucose: Secondary | ICD-10-CM | POA: Diagnosis not present

## 2015-07-25 LAB — BAYER DCA HB A1C WAIVED: HB A1C (BAYER DCA - WAIVED): 5.9 % (ref <7.0)

## 2015-07-25 MED ORDER — CALCIUM CARBONATE-VITAMIN D 600-200 MG-UNIT PO TABS: 1.0000 | ORAL_TABLET | Freq: Every day | ORAL | Status: DC

## 2015-07-25 NOTE — Progress Notes (Signed)
[facility] Current Height: Height: 5' 0.5" (153.7 cm)      Max Lifetime Height:  5\' 0"  Current Weight: Weight: 151 lb (68.493 kg)       Ethnicity:Caucasian   HPI: Does pt already have a diagnosis of:  Osteopenia?  Yes Osteoporosis?  No  Back Pain?  No       Kyphosis?  No Prior fracture?  Yes  - ankle, left at age 82yo Med(s) for Osteoporosis/Osteopenia:  ibandronate 150mg  monthly - started about 10 years ago Med(s) previously tried for Osteoporosis/Osteopenia:  none                                                             PMH: Age at menopause:  82 yo Hysterectomy?  Yes Oophorectomy?  No HRT? No Steroid Use?  No Thyroid med?  No History of cancer?  No History of digestive disorders (ie Crohn's)?  No Current or previous eating disorders?  No Last Vitamin D Result:  No information on file Last GFR Result:  81 (07/23/2015)   FH/SH: Family history of osteoporosis?  No Parent with history of hip fracture?  Yes - mother Family history of breast cancer?  No Exercise?  No Smoking?  No Alcohol?  No    Calcium Assessment Calcium Intake  # of servings/day  Calcium mg  Milk (8 oz) 1  x  300  = 300mg   Yogurt (4 oz) 0 x  200 = 0  Cheese (1 oz) 0 x  200 = 0  Other Calcium sources   250mg   Ca supplement 0 = 0   Estimated calcium intake per day 550mg     DEXA Results Date of Test T-Score for AP Spine L1-L4 T-Score for Neck of Left Hip  07/23/2015 1.1 -1.4  03/13/2010 1.1 -1.1  04/05/2007 0.5 -1.6  05/03/2001 -0.1 -1.2   FRAX 10 year estimate: Total FX risk:  25%  (consider medication if >/= 20%) Hip FX risk:  15%  (consider medication if >/= 3%)  Assessment: Osteopenia with 10 year of bisphosphonate therapy; great improvements in BMD  Recommendations: 1.  Stop ibandronate since on therapy over 5 years.  Will consider if needed when recheck DEXA in 2 years 2.  recommend calcium 1200mg  daily through supplementation or diet.  3.  recommend weight bearing  exercise - 30 minutes at least 4 days per week.   4.  Counseled and educated about fall risk and prevention.  Recheck DEXA:  2 years  Time spent counseling patient:  25 minutes   Cherre Robins, PharmD, CPP

## 2015-07-25 NOTE — Patient Instructions (Addendum)
Increase low fat milk intake - 345m of calcium per 8 ounces Increase green leafty vegetables, salmon, broccoli - these are rich in calcium Goal is to get 12048mper day.   Stop ibandronate (recommended to just take for 5 years)                Exercise for Strong Bones  Exercise is important to build and maintain strong bones / bone density.  There are 2 types of exercises that are important to building and maintaining strong bones:  Weight- bearing and muscle-stregthening.  Weight-bearing Exercises  These exercises include activities that make you move against gravity while staying upright. Weight-bearing exercises can be high-impact or low-impact.  High-impact weight-bearing exercises help build bones and keep them strong. If you have broken a bone due to osteoporosis or are at risk of breaking a bone, you may need to avoid high-impact exercises. If you're not sure, you should check with your healthcare provider.  Examples of high-impact weight-bearing exercises are: Dancing  Doing high-impact aerobics  Hiking  Jogging/running  Jumping Rope  Stair climbing  Tennis  Low-impact weight-bearing exercises can also help keep bones strong and are a safe alternative if you cannot do high-impact exercises.   Examples of low-impact weight-bearing exercises are: Using elliptical training machines  Doing low-impact aerobics  Using stair-step machines  Fast walking on a treadmill or outside   Muscle-Strengthening Exercises These exercises include activities where you move your body, a weight or some other resistance against gravity. They are also known as resistance exercises and include: Lifting weights  Using elastic exercise bands  Using weight machines  Lifting your own body weight  Functional movements, such as standing and rising up on your toes  Yoga and Pilates can also improve strength, balance and flexibility. However, certain positions may not be safe for people with  osteoporosis or those at increased risk of broken bones. For example, exercises that have you bend forward may increase the chance of breaking a bone in the spine.   Non-Impact Exercises There are other types of exercises that can help prevent falls.  Non-impact exercises can help you to improve balance, posture and how well you move in everyday activities. Some of these exercises include: Balance exercises that strengthen your legs and test your balance, such as Tai Chi, can decrease your risk of falls.  Posture exercises that improve your posture and reduce rounded or "sloping" shoulders can help you decrease the chance of breaking a bone, especially in the spine.  Functional exercises that improve how well you move can help you with everyday activities and decrease your chance of falling and breaking a bone. For example, if you have trouble getting up from a chair or climbing stairs, you should do these activities as exercises.   **A physical therapist can teach you balance, posture and functional exercises. He/she can also help you learn which exercises are safe and appropriate for you.  [facility] has a physical therapy office in [place] front of our office and referrals can be made for assessments and treatment as needed and strength and balance training.  If you would like to have an assessment with [facility] our physical therapy team please let a nurse or provider know.   Fall Prevention in the Home  Falls can cause injuries and can affect people from all age groups. There are many simple things that you can do to make your home safe and to help prevent falls. WHAT CAN  I DO ON THE OUTSIDE OF MY HOME?  Regularly repair the edges of walkways and driveways and fix any cracks.  Remove high doorway thresholds.  Trim any shrubbery on the main path into your home.  Use bright outdoor lighting.  Clear walkways of debris and clutter, including tools and rocks.  Regularly check that  handrails are securely fastened and in good repair. Both sides of any steps should have handrails.  Install guardrails along the edges of any raised decks or porches.  Have leaves, snow, and ice cleared regularly.  Use sand or salt on walkways during winter months.  In the garage, clean up any spills right away, including grease or oil spills. WHAT CAN I DO IN THE BATHROOM?  Use night lights.  Install grab bars by the toilet and in the tub and shower. Do not use towel bars as grab bars.  Use non-skid mats or decals on the floor of the tub or shower.  If you need to sit down while you are in the shower, use a plastic, non-slip stool.[place] [facility]  Keep the floor dry. Immediately clean up any water that spills on the floor.  Remove soap buildup in the tub or shower on a regular basis.  Attach bath mats securely with double-sided non-slip rug tape.  Remove throw rugs and other tripping hazards from the floor. WHAT CAN I DO IN THE BEDROOM?  Use night lights.  Make sure that a bedside light is easy to reach.  Do not use oversized bedding that drapes onto the floor.  Have a firm chair that has side arms to use for getting dressed.  Remove throw rugs and other tripping hazards from the floor. WHAT CAN I DO IN THE KITCHEN?   Clean up any spills right away.  Avoid walking on wet floors.  Place frequently used items in easy-to-reach places.  If you need to reach for something above you, use a sturdy step stool that has a grab bar.  Keep electrical cables out of the way.  Do not use floor polish or wax that makes floors slippery. If you have to use wax, make sure that it is non-skid floor wax.  Remove throw rugs and other tripping hazards from the floor. WHAT CAN I DO IN THE STAIRWAYS?  Do not leave any items on the stairs.  Make sure that there are handrails on both sides of the stairs. Fix handrails that are broken or loose. Make sure that handrails are as long as the  stairways.  Check any carpeting to make sure that it is firmly attached to the stairs. Fix any carpet that is loose or worn.  Avoid having throw rugs at the top or bottom of stairways, or secure the rugs with carpet tape to prevent them from moving.  Make sure that you have a light switch at the top of the stairs and the bottom of the stairs. If you do not have them, have them installed. WHAT ARE SOME OTHER FALL PREVENTION TIPS?  Wear closed-toe shoes that fit well and support your feet. Wear shoes that have rubber soles or low heels.  When you use a stepladder, make sure that it is completely opened and that the sides are firmly locked. Have someone hold the ladder while you are using it. Do not climb a closed stepladder.  Add color or contrast paint or tape to grab bars and handrails in your home. Place contrasting color strips on the first and last steps.  Use mobility  aids as needed, such as canes, walkers, scooters, and crutches.  Turn on lights if it is dark. Replace any light bulbs that burn out.  Set up furniture so that there are clear paths. Keep the furniture in the same spot.  Fix any uneven floor surfaces.  Choose a carpet design that does not hide the edge of steps of a stairway.  Be aware of any and all pets.  Review your medicines with your healthcare provider. Some medicines can cause dizziness or changes in blood pressure, which increase your risk of falling. Talk with your health care provider about other ways that you can decrease your risk of falls. This may include working with a physical therapist or trainer to improve your strength, balance, and endurance.   This information is not intended to replace advice given to you by your health care provider. Make sure you discuss any questions you have with your health care provider.   Document Released: 02/14/2002 Document Revised: 07/11/2014 Document Reviewed: 03/31/2014 Elsevier Interactive Patient Education 2016  [employer].   Prediabetes Many people have heard about type 2 diabetes, but its common precursor, prediabetes, doesn't get as much attention. Prediabetes is estimated by CDC to affect 86 million Americans (this includes 51% of people 65 years and older), and an estimated 90% of people with prediabetes don't even know it. According to the CDC, 15-30% of these individuals will develop type 2 diabetes within five years. In other words, as many as 26 million people that currently have prediabetes could develop type 2 diabetes by 2020, effectively doubling the number of people with type 2 diabetes in the [place].  What is prediabetes? Prediabetes is a condition where blood sugar levels are higher than normal, but not high enough to be diagnosed as type 2 diabetes. This occurs when the body has problems in processing glucose properly, and sugar starts to build up in the bloodstream instead of fueling cells in muscles and tissues. Insulin is the hormone that tells cells to take up glucose, and in prediabetes, people typically initially develop insulin resistance (where the body's cells can't respond to insulin as well), and over time (if no actions are taken to reverse the situation) the ability to produce sufficient insulin is reduced. People with prediabetes also commonly have high blood pressure as well as abnormal blood lipids (e.g. cholesterol). These often occur prior to the rise of blood glucose levels.  What are the symptoms of prediabetes? People typically do not have symptoms of prediabetes, which is partially why up to 90% of people don't know they have it. The ADA reports that some people with prediabetes may develop symptoms of type 2 diabetes, though even many people diagnosed with type 2 diabetes show little or no symptoms initially at diagnosis.  How is prediabetes diagnosed? According to the American Diabetes Association, prediabetes can be diagnosed through one of the following tests: 1. A  glycated hemoglobin test, also known as HbA1c or simply A1c, gives an idea of the body's average blood sugar levels from the past two or three months. It is usually done with a small drop of blood from a fingerstick or as part of having blood taken in a doctor's office, hospital, or laboratory. A1c Level Diagnosis  Less than 5.7% Normal  5.7% to 6.4% Prediabetes  6.5% and higher Diabetes  2. A fasting plasma glucose (FPG) test measures a person's blood glucose level after fasting (not eating) for eight hours - this is typically done in the morning.  If a test shows positive for prediabetes, a second test should be taken on a different day to confirm the diagnosis. FPG Level Diagnosis  Less than 100 mg/dl Normal  100 mg/dl to 125 mg/dl Prediabetes  126 mg/dl and higher Diabetes   Who is at risk of developing prediabetes? A well-known paper published in the Lancet in 2010 recommends screening for type 2 diabetes (which would also screen for prediabetes) every 3-5 years in all adults over the age of 8, regardless of other risk factors. Overweight and obese adults (a BMI >25 kg/m2) are also at significantly greater risk for developing prediabetes, as well as people with a family history of type 2 diabetes. According to the CDC, several other factors can have moderate influences on prediabetes risk in addition to age, weight, and family history: People with an [place] American, Hispanic/Latino, American [place] American, or [place] racial or ethnic background. The 2015 ADA Standards of Medical Care recommendations suggest Asian Americans with a BMI of 23 or above be screened for type 2 diabetes.  Women with a history of diabetes during pregnancy ("gestational diabetes") or have given birth to a baby weighing nine pounds or more. People who are physically active fewer than three times a week. The CDC offers a fast, online screening test for evaluating the risk for prediabetes. The ADA also  offers a screening test to assess type 2 diabetes risk. Of course, these tests do not themselves confirm a prediabetes diagnosis, but just if someone may be at higher risk of developing it.  Why do people develop prediabetes? Prediabetes develops through a combination of factors that are still being investigated. For sure, lifestyle factors (food, exercise, stress, sleep) play a role, but family history and genetics certainly do as well. It is easy to assume that prediabetes is the result of being overweight, but the relationship is not that simple. While obesity is one underlying cause of insulin resistance, many overweight individuals may never develop prediabetes or type 2 diabetes, and a minority of people with prediabetes have never been overweight. To make matters worse, it can be increasingly difficult to make healthy choices in today's toxic food environment that steers all of [place] to make the wrong food choices, and there are many factors that can contribute to weight gain in addition to diet.  Is a prediabetes diagnosis serious? There has been significant debate around the term 'prediabetes,' and whether it should be considered cause for alarm. On the one hand, it serves as a risk factor for type 2 diabetes and a host of other complications, including heart disease, and ultimately prediabetes implies that a degree of metabolic problems have started to occur in the body. On the other hand, it places a diagnosis on many people who may never develop type 2 diabetes. Again, according to the CDC, 15-30% of those with prediabetes will develop type 2 diabetes within five years. However, a 2012 Lancet article cites 5-10% of those with prediabetes each year will also revert back to healthy blood sugars. What's critical is not necessarily the cutoff itself, but where someone falls within the ranges listed above. The level of risk of developing type 2 diabetes is closely related to A1c or FPG at diagnosis. Those  in the higher ranges (A1c closer to 6.4%, FPG closer to 125 mg/dl) are much more likely to progress to type 2 diabetes, whereas those at lower ranges (A1c closer to 5.7%, FPG closer to 100 mg/dl) are relatively more likely to revert  back to normal glucose levels or stay within the prediabetes range. Age of diagnosis and the level of insulin production still occurring at diagnosis also impact the chances of reverting to normoglycemia (normal blood sugar levels).  What can people with prediabetes do to avoid the progression from prediabetes to type 2 diabetes? The most important action people diagnosed with prediabetes can take is to focus on living a healthy lifestyle. This includes making healthy food choices, controlling portions, and increasing physical activity. Regarding weight control, research shows losing 5-7% (often about 10-20 lbs.) from your initial body weight and keeping off as much of that weight over time as possible is critical to lowering the risk of type 2 diabetes. This task is of course easier said than done, but sustained weight loss over time can be key to improving health and delaying or preventing the onset of type 2 diabetes. Several prediabetes interventions exist based on evidence from the landmark Diabetes Prevention Program (DPP) study. The DPP study reported that moderate weight loss (5-7% of body weight, or ~10-15 lbs. for someone weighing 200 lbs.), counseling, and education on healthy eating and behavior reduced the risk of developing type 2 diabetes by 58%. Data presented at the [employer] 2014 conference showed that after 15 years of follow-up of the DPP study groups, the results were still encouraging: 27% of those in the original lifestyle group had a significant reduction in type 2 diabetes progression compared to the control group. If you or someone you know has been told they have prediabetes, here are a few helpful resources: In-person diabetes prevention programs: The CDC  offers a one year long lifestyle change program through its National Diabetes Prevention Program (NDPP) at various locations throughout the [place] to help participants adopt healthy habits and prevent or delay progression to type 2 diabetes. This program is a major undertaking by the CDC to translate the findings from the DPP study into a real world setting, a significant effort indeed! Online diabetes prevention programs: The CDC has now given pending recognition status to three digital prevention programs: [place], and [facility]. These offer the same one year long educational curriculum as the DPP study, but in an online format. Some insurance companies and employers cover these programs, and you can find more information at the links above. These digital versions are excellent options for those who live far away from NDPP locations or who prefer the anonymity and convenience of doing the program online. Metformin: The DPP study found that metformin, the safest first-line therapy for type 2 diabetes, may help delay the onset of type 2 diabetes in people with prediabetes. Participants who took the low-cost generic drug had a 31% reduced risk of developing type 2 diabetes compared to the control group (those not on metformin or intensive lifestyle intervention). Again, 15-year follow up data showed that 17% of those on metformin continued to have a significant reduction in type 2 progression. At this time, metformin (or any other medication, for that matter) is not currently FDA approved for prediabetes, and it is sometimes prescribed "off-label" by a healthcare provider. Your healthcare provider can give you more information and determine whether metformin is a good option for you.  Can prediabetes be "cured"? In the early stages of prediabetes (and type 2 diabetes), diligent attention to food choices and activity, and most importantly weight loss, can improve blood sugar numbers, effectively  "reversing" the disease and reducing the odds of developing type 2 diabetes. However, some people  may have underlying factors (such as family history and genetics) that put them at a greater risk of type 2 diabetes, meaning they will always require careful attention to blood sugar levels and lifestyle choices. Returning to old habits will likely put someone back on the road to prediabetes, and eventually, type 2 diabetes

## 2015-07-27 LAB — SPECIMEN STATUS REPORT

## 2015-07-27 LAB — VITAMIN D 25 HYDROXY (VIT D DEFICIENCY, FRACTURES): VIT D 25 HYDROXY: 17.4 ng/mL — AB (ref 30.0–100.0)

## 2015-08-22 DIAGNOSIS — H353211 Exudative age-related macular degeneration, right eye, with active choroidal neovascularization: Secondary | ICD-10-CM | POA: Diagnosis not present

## 2015-10-09 DIAGNOSIS — H353132 Nonexudative age-related macular degeneration, bilateral, intermediate dry stage: Secondary | ICD-10-CM | POA: Diagnosis not present

## 2015-10-09 DIAGNOSIS — H353211 Exudative age-related macular degeneration, right eye, with active choroidal neovascularization: Secondary | ICD-10-CM | POA: Diagnosis not present

## 2015-11-29 DIAGNOSIS — H353211 Exudative age-related macular degeneration, right eye, with active choroidal neovascularization: Secondary | ICD-10-CM | POA: Diagnosis not present

## 2015-12-11 ENCOUNTER — Other Ambulatory Visit: Payer: Self-pay | Admitting: Family Medicine

## 2015-12-11 DIAGNOSIS — Z1231 Encounter for screening mammogram for malignant neoplasm of breast: Secondary | ICD-10-CM

## 2015-12-24 ENCOUNTER — Ambulatory Visit
Admission: RE | Admit: 2015-12-24 | Discharge: 2015-12-24 | Disposition: A | Discharge status: Home / Self Care | Payer: Medicare Other | Source: Ambulatory Visit | Attending: Family Medicine | Admitting: Family Medicine

## 2015-12-24 DIAGNOSIS — Z1231 Encounter for screening mammogram for malignant neoplasm of breast: Secondary | ICD-10-CM

## 2015-12-25 ENCOUNTER — Ambulatory Visit (INDEPENDENT_AMBULATORY_CARE_PROVIDER_SITE_OTHER): Payer: Medicare Other

## 2015-12-25 DIAGNOSIS — Z23 Encounter for immunization: Secondary | ICD-10-CM

## 2016-01-15 DIAGNOSIS — H353211 Exudative age-related macular degeneration, right eye, with active choroidal neovascularization: Secondary | ICD-10-CM | POA: Diagnosis not present

## 2016-01-24 ENCOUNTER — Ambulatory Visit: Payer: Medicare Other | Admitting: Family Medicine

## 2016-01-24 DIAGNOSIS — H353211 Exudative age-related macular degeneration, right eye, with active choroidal neovascularization: Secondary | ICD-10-CM | POA: Diagnosis not present

## 2016-01-24 DIAGNOSIS — Z961 Presence of intraocular lens: Secondary | ICD-10-CM | POA: Diagnosis not present

## 2016-01-24 DIAGNOSIS — H353122 Nonexudative age-related macular degeneration, left eye, intermediate dry stage: Secondary | ICD-10-CM | POA: Diagnosis not present

## 2016-02-04 ENCOUNTER — Ambulatory Visit (INDEPENDENT_AMBULATORY_CARE_PROVIDER_SITE_OTHER): Payer: Medicare Other | Admitting: Family Medicine

## 2016-02-04 ENCOUNTER — Encounter: Payer: Self-pay | Admitting: Family Medicine

## 2016-02-04 VITALS — BP 129/64 | HR 76 | Temp 98.0°F | Ht 60.0 in | Wt 122.1 lb

## 2016-02-04 DIAGNOSIS — M859 Disorder of bone density and structure, unspecified: Secondary | ICD-10-CM | POA: Diagnosis not present

## 2016-02-04 DIAGNOSIS — I10 Essential (primary) hypertension: Secondary | ICD-10-CM

## 2016-02-04 DIAGNOSIS — R7303 Prediabetes: Secondary | ICD-10-CM

## 2016-02-04 DIAGNOSIS — M8580 Other specified disorders of bone density and structure, unspecified site: Secondary | ICD-10-CM | POA: Diagnosis not present

## 2016-02-04 DIAGNOSIS — E782 Mixed hyperlipidemia: Secondary | ICD-10-CM

## 2016-02-04 LAB — BAYER DCA HB A1C WAIVED: HB A1C (BAYER DCA - WAIVED): 5.7 % (ref <7.0)

## 2016-02-04 MED ORDER — FELODIPINE ER 5 MG PO TB24: 5.0000 mg | ORAL_TABLET | Freq: Every day | ORAL | 5 refills | Status: DC

## 2016-02-04 MED ORDER — CARVEDILOL 12.5 MG PO TABS: 12.5000 mg | ORAL_TABLET | Freq: Every day | ORAL | 3 refills | Status: DC

## 2016-02-04 MED ORDER — HYDROCHLOROTHIAZIDE 12.5 MG PO TABS: 12.5000 mg | ORAL_TABLET | Freq: Every day | ORAL | 3 refills | Status: DC

## 2016-02-04 NOTE — Progress Notes (Signed)
BP 129/64   Pulse 76   Temp 98 F (36.7 C) (Oral)   Ht 5' (1.524 m)   Wt 122 lb 2 oz (55.4 kg)   BMI 23.85 kg/m    Subjective:    Patient ID: Ashley Peters, female    DOB: 23-Aug-1928, 82 y.o.   MRN: [id number]  HPI: Ashley Peters is a 82 y.o. female presenting on 02/04/2016 for Hyperlipidemia (6 month followup; patient stopped Tricor, started having leg pains) and Hypertension   HPI Hypertension Patient comes in today for hypertension recheck. Her blood pressure today is 129/64. She is currently on carvedilol and felodipine and hydrochlorothiazide. Patient denies headaches, blurred vision, chest pains, shortness of breath, or weakness. Denies any side effects from medication and is content with current medication.   Hyperlipidemia recheck Patient is coming in today for a cholesterol recheck. She is to be on TriCor but could not tolerate it because she was cannot myalgias, we will recheck her levels and see where she is at. She denies any chest pain or focal numbness or weakness.  Prediabetes Patient is a diet controlled prediabetic and is coming in for recheck today. She has also lost 29 pounds over the past 6 months because of dietary and lifestyle changes and is feeling very well about her life. Patient has an allergy to angiotensin receptor blockers and is therefore not on an Ace or an arb.  Vitamin D deficiency and osteopenia Patient had low vitamin D on her last check and also has known osteopenia and is on supplementation, we will recheck today. She denies any fractures.  Relevant past medical, surgical, family and social history reviewed and updated as indicated. Interim medical history since our last visit reviewed. Allergies and medications reviewed and updated.  Review of Systems  Constitutional: Negative for chills and fever.  HENT: Negative for congestion, ear discharge and ear pain.   Eyes: Negative for redness and visual disturbance.  Respiratory: Negative for  chest tightness and shortness of breath.   Cardiovascular: Negative for chest pain and leg swelling.  Genitourinary: Negative for difficulty urinating and dysuria.  Musculoskeletal: Negative for back pain and gait problem.  Skin: Negative for rash.  Neurological: Negative for dizziness, weakness, light-headedness, numbness and headaches.  Psychiatric/Behavioral: Negative for agitation and behavioral problems.  All other systems reviewed and are negative.   Per HPI unless specifically indicated above     Objective:    BP 129/64   Pulse 76   Temp 98 F (36.7 C) (Oral)   Ht 5' (1.524 m)   Wt 122 lb 2 oz (55.4 kg)   BMI 23.85 kg/m   Wt Readings from Last 3 Encounters:  02/04/16 122 lb 2 oz (55.4 kg)  07/25/15 151 lb (68.5 kg)  07/23/15 150 lb (68 kg)    Physical Exam  Constitutional: She is oriented to person, place, and time. She appears well-developed and well-nourished. No distress.  Eyes: Conjunctivae are normal.  Cardiovascular: Normal rate, regular rhythm, normal heart sounds and intact distal pulses.   No murmur heard. Pulmonary/Chest: Effort normal and breath sounds normal. No respiratory distress. She has no wheezes. She has no rales.  Musculoskeletal: Normal range of motion. She exhibits no edema or tenderness.  Neurological: She is alert and oriented to person, place, and time. Coordination normal.  Skin: Skin is warm and dry. No rash noted. She is not diaphoretic.  Psychiatric: She has a normal mood and affect. Her behavior is normal.  Nursing note  and vitals reviewed.     Assessment & Plan:   Problem List Items Addressed This Visit      Cardiovascular and Mediastinum   HTN (hypertension)   Relevant Medications   hydrochlorothiazide (HYDRODIURIL) 12.5 MG tablet   felodipine (PLENDIL) 5 MG 24 hr tablet   carvedilol (COREG) 12.5 MG tablet   Other Relevant Orders   CMP14+EGFR     Musculoskeletal and Integument   Osteopenia   Relevant Orders   CBC with  Differential/Platelet   VITAMIN D 25 Hydroxy (Vit-D Deficiency, Fractures)     Other   Hyperlipidemia - Primary   Relevant Medications   hydrochlorothiazide (HYDRODIURIL) 12.5 MG tablet   felodipine (PLENDIL) 5 MG 24 hr tablet   carvedilol (COREG) 12.5 MG tablet   Other Relevant Orders   Lipid panel    Other Visit Diagnoses    Prediabetes       Patient's prediabetes is diet controlled, she is also lost 29 pounds in the past 6 months.   Relevant Orders   Bayer DCA Hb A1c Waived       Follow up plan: Return in about 6 months (around 08/03/2016), or if symptoms worsen or fail to improve, for Recheck hypertension and diabetes.  Counseling provided for all of the vaccine components Orders Placed This Encounter  Procedures  . CMP14+EGFR  . CBC with Differential/Platelet  . Lipid panel  . VITAMIN D 25 Hydroxy (Vit-D Deficiency, Fractures)  . [facility] Hb A1c Waived    Caryl Pina, MD [facility] 02/04/2016, 10:04 AM

## 2016-02-05 LAB — CMP14+EGFR
ALBUMIN: 3.7 g/dL (ref 3.5–4.7)
ALT: 13 IU/L (ref 0–32)
AST: 17 IU/L (ref 0–40)
Albumin/Globulin Ratio: 1.3 (ref 1.2–2.2)
Alkaline Phosphatase: 104 IU/L (ref 39–117)
BUN / CREAT RATIO: 22 (ref 12–28)
BUN: 12 mg/dL (ref 8–27)
Bilirubin Total: 0.4 mg/dL (ref 0.0–1.2)
CO2: 23 mmol/L (ref 18–29)
CREATININE: 0.54 mg/dL — AB (ref 0.57–1.00)
Calcium: 9.5 mg/dL (ref 8.7–10.3)
Chloride: 99 mmol/L (ref 96–106)
GFR calc non Af Amer: 86 mL/min/{1.73_m2} (ref >59)
GFR, EST AFRICAN AMERICAN: 100 mL/min/{1.73_m2} (ref >59)
GLUCOSE: 91 mg/dL (ref 65–99)
Globulin, Total: 2.8 g/dL (ref 1.5–4.5)
Potassium: 4.9 mmol/L (ref 3.5–5.2)
Sodium: 140 mmol/L (ref 134–144)
TOTAL PROTEIN: 6.5 g/dL (ref 6.0–8.5)

## 2016-02-05 LAB — CBC WITH DIFFERENTIAL/PLATELET
Basophils Absolute: 0 10*3/uL (ref 0.0–0.2)
Basos: 0 %
EOS (ABSOLUTE): 0.1 10*3/uL (ref 0.0–0.4)
EOS: 2 %
HEMATOCRIT: 34.2 % (ref 34.0–46.6)
HEMOGLOBIN: 11.1 g/dL (ref 11.1–15.9)
Immature Grans (Abs): 0 10*3/uL (ref 0.0–0.1)
Immature Granulocytes: 0 %
LYMPHS ABS: 1.5 10*3/uL (ref 0.7–3.1)
Lymphs: 21 %
MCH: 24.9 pg — AB (ref 26.6–33.0)
MCHC: 32.5 g/dL (ref 31.5–35.7)
MCV: 77 fL — ABNORMAL LOW (ref 79–97)
MONOCYTES: 6 %
Monocytes Absolute: 0.4 10*3/uL (ref 0.1–0.9)
Neutrophils Absolute: 5.1 10*3/uL (ref 1.4–7.0)
Neutrophils: 71 %
Platelets: 377 10*3/uL (ref 150–379)
RBC: 4.45 x10E6/uL (ref 3.77–5.28)
RDW: 15.9 % — ABNORMAL HIGH (ref 12.3–15.4)
WBC: 7.2 10*3/uL (ref 3.4–10.8)

## 2016-02-05 LAB — LIPID PANEL
CHOLESTEROL TOTAL: 153 mg/dL (ref 100–199)
Chol/HDL Ratio: 3.7 ratio units (ref 0.0–4.4)
HDL: 41 mg/dL (ref >39)
LDL CALC: 74 mg/dL (ref 0–99)
Triglycerides: 188 mg/dL — ABNORMAL HIGH (ref 0–149)
VLDL CHOLESTEROL CAL: 38 mg/dL (ref 5–40)

## 2016-02-05 LAB — VITAMIN D 25 HYDROXY (VIT D DEFICIENCY, FRACTURES): VIT D 25 HYDROXY: 34 ng/mL (ref 30.0–100.0)

## 2016-03-11 DIAGNOSIS — H353211 Exudative age-related macular degeneration, right eye, with active choroidal neovascularization: Secondary | ICD-10-CM | POA: Diagnosis not present

## 2016-05-06 DIAGNOSIS — H35362 Drusen (degenerative) of macula, left eye: Secondary | ICD-10-CM | POA: Diagnosis not present

## 2016-05-06 DIAGNOSIS — H353211 Exudative age-related macular degeneration, right eye, with active choroidal neovascularization: Secondary | ICD-10-CM | POA: Diagnosis not present

## 2016-05-06 DIAGNOSIS — H353132 Nonexudative age-related macular degeneration, bilateral, intermediate dry stage: Secondary | ICD-10-CM | POA: Diagnosis not present

## 2016-05-06 DIAGNOSIS — H35361 Drusen (degenerative) of macula, right eye: Secondary | ICD-10-CM | POA: Diagnosis not present

## 2016-05-06 DIAGNOSIS — Z961 Presence of intraocular lens: Secondary | ICD-10-CM | POA: Diagnosis not present

## 2016-06-04 ENCOUNTER — Encounter: Payer: Self-pay | Admitting: Pediatrics

## 2016-06-04 ENCOUNTER — Ambulatory Visit (INDEPENDENT_AMBULATORY_CARE_PROVIDER_SITE_OTHER): Payer: Medicare Other | Admitting: Pediatrics

## 2016-06-04 VITALS — Temp 97.1°F | Ht 60.0 in | Wt 120.4 lb

## 2016-06-04 DIAGNOSIS — R079 Chest pain, unspecified: Secondary | ICD-10-CM

## 2016-06-04 DIAGNOSIS — I10 Essential (primary) hypertension: Secondary | ICD-10-CM | POA: Diagnosis not present

## 2016-06-04 DIAGNOSIS — K219 Gastro-esophageal reflux disease without esophagitis: Secondary | ICD-10-CM

## 2016-06-04 MED ORDER — CARVEDILOL 6.25 MG PO TABS: 6.2500 mg | ORAL_TABLET | Freq: Every day | ORAL | 0 refills | Status: DC

## 2016-06-04 MED ORDER — OMEPRAZOLE 20 MG PO CPDR: 20.0000 mg | DELAYED_RELEASE_CAPSULE | Freq: Two times a day (BID) | ORAL | 3 refills | Status: DC

## 2016-06-04 NOTE — Patient Instructions (Signed)
Stop hydrochlorothiazide. Call [facility] to let them know or they will continue to send you this medicine.  Decrease carvedilol to 6.25mg  in the morning  Take omeprazole 20mg  30 minutes before breakfast.  Check blood pressures at home. If regularly <120 on top let us know, we may need to change your medications again

## 2016-06-04 NOTE — Progress Notes (Signed)
  Subjective:   Patient ID: Ashley Peters, female    DOB: 11/03/1928, 83 y.o.   MRN: [id number] CC: Dizziness and Chest Pain (began last night)  HPI: Ashley Peters is a 83 y.o. female presenting for Dizziness and Chest Pain (began last night)  Having midline chest/abd pain that comes and goes When she lies down it eases up Tylenol has also helped it Now feels like a gnawing, hungry feeling in upper abd when she lies down. Points to stomach when asked where her pain is.  Same pain started last night after dinner when sitting on the sofa Felt like a burning, gnawing feeling in her stomach Lasted until she went to bed, was still there when she woke up No nausea, vomiting, SOB No arm pain Pain does not radiate No change in eating habits or food Had ham sandwich last night Appetite has been ok today despite the pain No worsening in symptoms with food or exertion  Has felt dizzy when she stands up for the past couple of days Has been on a diet, has lost 30 lbs over past 9 months Is plannign to stop diet soon Eating regular meals  Taking BP meds regularly No h/o MI, CVA   Relevant past medical, surgical, family and social history reviewed. Allergies and medications reviewed and updated. History  Smoking Status  . Never Smoker  Smokeless Tobacco  . Never Used   ROS: Per HPI   Objective:    Temp 97.1 F (36.2 C) (Oral)   Ht 5' (1.524 m)   Wt 120 lb 6.4 oz (54.6 kg)   BMI 23.51 kg/m   Wt Readings from Last 3 Encounters:  06/04/16 120 lb 6.4 oz (54.6 kg)  02/04/16 122 lb 2 oz (55.4 kg)  07/25/15 151 lb (68.5 kg)    Gen: NAD, alert, cooperative with exam, NCAT EYES: EOMI, no conjunctival injection, or no icterus ENT:  OP without erythema LYMPH: no cervical LAD CV: NRRR, normal S1/S2, no murmur, distal pulses 2+ b/l Resp: CTABL, no wheezes, normal WOB Abd: +BS, soft, mildly tender epigastric area, ND. no guarding or organomegaly, midline hernia present, easily  reducible Ext: No edema, warm Neuro: Alert and oriented  EKG: nsr, HR 81, narrow qrs, no twave inversions  Assessment & Plan:  [name] was seen today for dizziness and chest pain.  Diagnoses and all orders for this visit:  Chest pain, unspecified type Atypical chest pain, mostly stomach discomfort/burning  Present since yesterday, not associated with exertion, no other associated symptoms Discussed strict return precautions, any worsening symptoms, will treat for GERD with below -     EKG 12-Lead  Essential hypertension Low BP today, symptomatic at home Stop HCTZ Decrease carvedilol dose Check Bps at home f/u with PCP in 2 weeks -     carvedilol (COREG) 6.25 MG tablet; Take 1 tablet (6.25 mg total) by mouth daily.  Gastroesophageal reflux disease, esophagitis presence not specified Start below If symptoms not improving must be seen -     omeprazole (PRILOSEC) 20 MG capsule; Take 1 capsule (20 mg total) by mouth 2 (two) times daily before a meal.   Follow up plan: Return in about 2 weeks (around 06/18/2016) for blood pressure. Assunta Found, MD [place]

## 2016-06-19 ENCOUNTER — Encounter: Payer: Self-pay | Admitting: Pediatrics

## 2016-06-19 ENCOUNTER — Ambulatory Visit (INDEPENDENT_AMBULATORY_CARE_PROVIDER_SITE_OTHER): Payer: Medicare Other | Admitting: Pediatrics

## 2016-06-19 VITALS — BP 108/69 | HR 103 | Temp 97.4°F | Ht 60.0 in | Wt 117.4 lb

## 2016-06-19 DIAGNOSIS — K219 Gastro-esophageal reflux disease without esophagitis: Secondary | ICD-10-CM | POA: Diagnosis not present

## 2016-06-19 DIAGNOSIS — I10 Essential (primary) hypertension: Secondary | ICD-10-CM

## 2016-06-19 NOTE — Progress Notes (Signed)
  Subjective:   Patient ID: Ashley Peters, female    DOB: 1928/06/13, 83 y.o.   MRN: [id number] CC: Follow-up (2 week BP)  HPI: Ashley Peters is a 83 y.o. female presenting for Follow-up (2 week BP)  HTN: stopped all of her BP meds No more dizzy spells Checking BPs at home three times a day, all 110s/ 60s  One episode of upper sotmach pain that she took the reflux for, went away No further abd pain  Has lost apprx 30 lbs on current diet, has been very pleased with her progress  Relevant past medical, surgical, family and social history reviewed. Allergies and medications reviewed and updated. History  Smoking Status  . Never Smoker  Smokeless Tobacco  . Never Used   ROS: Per HPI   Objective:    BP 108/69   Pulse (!) 103   Temp 97.4 F (36.3 C) (Oral)   Ht 5' (1.524 m)   Wt 117 lb 6.4 oz (53.3 kg)   BMI 22.93 kg/m   Wt Readings from Last 3 Encounters:  06/19/16 117 lb 6.4 oz (53.3 kg)  06/04/16 120 lb 6.4 oz (54.6 kg)  02/04/16 122 lb 2 oz (55.4 kg)   recheck pulse manually 94  Gen: NAD, alert, cooperative with exam, NCAT EYES: EOMI, no conjunctival injection, or no icterus CV: NRRR, normal S1/S2, no murmur, distal pulses 2+ b/l Resp: CTABL, no wheezes, normal WOB Abd: +BS, soft, NTND. no guarding or organomegaly Ext: No edema, warm Neuro: Alert and oriented  Assessment & Plan:  Ashley Peters was seen today for follow-up multiple med problems  Diagnoses and all orders for this visit:  Essential hypertension Off of medication Bps at home have been well controlled previoulsy on three agents HR up slightly, pt feels nervous Will check pulse at home, let me know if remains elevated  Gastroesophageal reflux disease, esophagitis presence not specified Cont prn PPI  Follow up plan: Return in about 3 months (around 09/18/2016) for medical follow up with PCP. Assunta Found, MD [place]

## 2016-06-23 ENCOUNTER — Ambulatory Visit (INDEPENDENT_AMBULATORY_CARE_PROVIDER_SITE_OTHER): Payer: Medicare Other | Admitting: *Deleted

## 2016-06-23 VITALS — BP 105/65 | HR 97 | Temp 98.7°F | Ht <= 58 in | Wt 117.0 lb

## 2016-06-23 DIAGNOSIS — Z0000 Encounter for general adult medical examination without abnormal findings: Secondary | ICD-10-CM

## 2016-06-23 DIAGNOSIS — Z23 Encounter for immunization: Secondary | ICD-10-CM

## 2016-06-23 NOTE — Patient Instructions (Signed)
  Ms. Swearengin , Thank you for taking time to come for your Medicare Wellness Visit. I appreciate your ongoing commitment to your health goals. Please review the following plan we discussed and let me know if I can assist you in the future.   These are the goals we discussed: Goals    . Exercise 3x per week (30 min per time)       This is a list of the screening recommended for you and due dates:  Health Maintenance  Topic Date Due  . Tetanus Vaccine  08/03/2016*  . Pneumonia vaccines (2 of 2 - PPSV23) 08/03/2016*  . Flu Shot  10/08/2016  . DEXA scan (bone density measurement)  07/22/2017  *Topic was postponed. The date shown is not the original due date.    Keep follow up appointment with Dr D. We will do a CXR and Tetanus shot on the day of your next office visit  Today we gave you a Prevnar (baby pneumonia vaccine)

## 2016-06-23 NOTE — Progress Notes (Signed)
Subjective:   Ashley Peters is a 83 y.o. female who presents for an Initial Medicare Annual Wellness Visit.  Patient here today for Medicare Annual Wellness visit. She is a 83 year old whom loves to quilt. She walks for exercise in her free time. [place] married a man who was in service. She had several jobs growing up due to her traveling with her husband. She worked for [employer], [employer] and retired from [employer]. She lives at her home alone with one small dog. She has 3 children, two of which lives locally. She attends Church on Sundays. He diet is very healthy, as she has recently been doing a diet program with our Clinical pharmacist. She states that her health is better than it was one year ago.        Objective:    Today's Vitals   06/23/16 1041  BP: 105/65  Pulse: 97  Temp: 98.7 F (37.1 C)  TempSrc: Oral  Weight: 117 lb (53.1 kg)  Height: 4\' 10"  (1.473 m)   Body mass index is 24.45 kg/m.   Current Medications (verified) Outpatient Encounter Prescriptions as of 06/23/2016  Medication Sig  . Calcium Carbonate-Vitamin D (CALCIUM 600+D) 600-200 MG-UNIT TABS Take 1 tablet by mouth daily.  Marland Kitchen omeprazole (PRILOSEC) 20 MG capsule Take 1 capsule (20 mg total) by mouth 2 (two) times daily before a meal.   No facility-administered encounter medications on file as of 06/23/2016.     Allergies (verified) Benicar [olmesartan medoxomil] and Amoxicillin   History: Past Medical History:  Diagnosis Date  . Diverticulosis   . GERD (gastroesophageal reflux disease)   . Hyperlipidemia   . Hypertension   . Macular degeneration   . Osteopenia    Past Surgical History:  Procedure Laterality Date  . ABDOMINAL HYSTERECTOMY    . ablation tr Great saphenous vein  10/17/2009  . brest biopsy  1971  . CHOLECYSTECTOMY    . lt. distal ureteral stone extraction  09/14/1991   Family History  Problem Relation Age of Onset  . Stroke Father    . Cancer Sister     breast  . Diabetes Brother   . Kidney disease Brother   . Heart disease Brother   . Epilepsy Son    Social History   Occupational History  . Not on file.   Social History Main Topics  . Smoking status: Never Smoker  . Smokeless tobacco: Never Used  . Alcohol use No  . Drug use: No  . Sexual activity: Yes    Tobacco Counseling Counseling given: Not Answered she does not smoke or use any type of tobacco.  Activities of Daily Living In your present state of health, do you have any difficulty performing the following activities: 06/23/2016  Hearing? N  Vision? Y  Difficulty concentrating or making decisions? N  Walking or climbing stairs? N  Dressing or bathing? N  Doing errands, shopping? N  Some recent data might be hidden   She has macular degeneration of her right eye, sees well with the left.   Immunizations and Health Maintenance Immunization History  Administered Date(s) Administered  . DTaP 11/09/2003  . Influenza Whole 12/10/2009  . Influenza,inj,Quad PF,36+ Mos 12/15/2012, 12/22/2013, 12/13/2014, 12/25/2015  . Pneumococcal Polysaccharide-23 03/16/2007   Prevnar 13 given today at visit  There are no preventive care reminders to display for this patient.  Patient Care Team: Worthy Rancher, MD as PCP -  General (Family Medicine) Clent Jacks, MD as Consulting Physician (Ophthalmology) Hurman Horn, MD as Consulting Physician (Ophthalmology)  Indicate any recent Medical Services you may have received from other than [facility] providers in the past year (date may be approximate).     Assessment:   This is a routine wellness examination for Ashley Peters.   Hearing/Vision screen No exam data present Follows 2 opthalmology groups for exams  Dietary issues and exercise activities discussed:    Goals    . Exercise 3x per week (30 min per time)      Depression Screen PHQ 2/9 Scores 06/23/2016 06/19/2016 06/04/2016 02/04/2016 07/23/2015  12/04/2014 11/29/2014  PHQ - 2 Score 0 0 0 0 0 0 0    Fall Risk Fall Risk  06/23/2016 06/19/2016 06/04/2016 07/23/2015 12/04/2014  Falls in the past year? No No No No No    Cognitive Function: MMSE - Mini Mental State Exam 06/23/2016 12/04/2014  Orientation to time 5 5  Orientation to Place 5 5  Registration 3 3  Attention/ Calculation 5 5  Recall 2 2  Language- name 2 objects 2 2  Language- repeat 1 1  Language- follow 3 step command 3 3  Language- read & follow direction 1 1  Write a sentence 1 1  Copy design 1 1  Total score 29 29    29  / 30 was score today.    Screening Tests Health Maintenance  Topic Date Due  . TETANUS/TDAP  08/03/2016 (Originally 11/08/2013)  . PNA vac Low Risk Adult (2 of 2 - PPSV23) 08/03/2016 (Originally 03/15/2008)  . INFLUENZA VACCINE  10/08/2016  . DEXA SCAN  07/22/2017      Plan:   keep follow up with Dr Lyfe Reihl Review the advanced directives with your daughter - have notary sign and bring in a copy. We will arrange a CXR and a tetanus to be done at your next OV with DR D.   During the course of the visit, Amela was educated and counseled about the following appropriate screening and preventive services:   Vaccines to include Pneumoccal, Influenza, Hepatitis B, Td, Zostavax, HCV  Electrocardiogram  Cardiovascular disease screening  Colorectal cancer screening  Bone density screening  Diabetes screening  Glaucoma screening  Mammography/PAP  Nutrition counseling  Smoking cessation counseling  Patient Instructions (the written plan) were given to the patient.    Bullins, Cameron Proud, LPN   9/32/6712   I have reviewed and agree with the above AWV documentation, Patient's memory looks good and no changes warranted at this time.   Caryl Pina, MD [facility] 06/23/2016, 1:08 PM

## 2016-07-08 DIAGNOSIS — H353211 Exudative age-related macular degeneration, right eye, with active choroidal neovascularization: Secondary | ICD-10-CM | POA: Diagnosis not present

## 2016-09-09 DIAGNOSIS — H353212 Exudative age-related macular degeneration, right eye, with inactive choroidal neovascularization: Secondary | ICD-10-CM | POA: Diagnosis not present

## 2016-09-09 DIAGNOSIS — H353113 Nonexudative age-related macular degeneration, right eye, advanced atrophic without subfoveal involvement: Secondary | ICD-10-CM | POA: Diagnosis not present

## 2016-09-09 DIAGNOSIS — H353123 Nonexudative age-related macular degeneration, left eye, advanced atrophic without subfoveal involvement: Secondary | ICD-10-CM | POA: Diagnosis not present

## 2016-09-09 DIAGNOSIS — H35731 Hemorrhagic detachment of retinal pigment epithelium, right eye: Secondary | ICD-10-CM | POA: Diagnosis not present

## 2016-09-19 ENCOUNTER — Ambulatory Visit (HOSPITAL_COMMUNITY)
Admission: RE | Admit: 2016-09-19 | Discharge: 2016-09-19 | Disposition: A | Discharge status: Home / Self Care | Payer: Medicare Other | Source: Ambulatory Visit | Attending: Family Medicine | Admitting: Family Medicine

## 2016-09-19 ENCOUNTER — Telehealth: Payer: Self-pay | Admitting: Family Medicine

## 2016-09-19 ENCOUNTER — Encounter: Payer: Self-pay | Admitting: Family Medicine

## 2016-09-19 ENCOUNTER — Ambulatory Visit (INDEPENDENT_AMBULATORY_CARE_PROVIDER_SITE_OTHER): Payer: Medicare Other | Admitting: Family Medicine

## 2016-09-19 VITALS — BP 114/72 | HR 97 | Temp 97.1°F | Ht <= 58 in | Wt 107.0 lb

## 2016-09-19 DIAGNOSIS — E782 Mixed hyperlipidemia: Secondary | ICD-10-CM | POA: Diagnosis not present

## 2016-09-19 DIAGNOSIS — R5383 Other fatigue: Secondary | ICD-10-CM

## 2016-09-19 DIAGNOSIS — K439 Ventral hernia without obstruction or gangrene: Secondary | ICD-10-CM | POA: Insufficient documentation

## 2016-09-19 DIAGNOSIS — R590 Localized enlarged lymph nodes: Secondary | ICD-10-CM | POA: Insufficient documentation

## 2016-09-19 DIAGNOSIS — I10 Essential (primary) hypertension: Secondary | ICD-10-CM | POA: Diagnosis not present

## 2016-09-19 DIAGNOSIS — R1903 Right lower quadrant abdominal swelling, mass and lump: Secondary | ICD-10-CM

## 2016-09-19 DIAGNOSIS — R1031 Right lower quadrant pain: Secondary | ICD-10-CM | POA: Diagnosis not present

## 2016-09-19 DIAGNOSIS — I700 Atherosclerosis of aorta: Secondary | ICD-10-CM | POA: Insufficient documentation

## 2016-09-19 DIAGNOSIS — K219 Gastro-esophageal reflux disease without esophagitis: Secondary | ICD-10-CM

## 2016-09-19 DIAGNOSIS — K5730 Diverticulosis of large intestine without perforation or abscess without bleeding: Secondary | ICD-10-CM | POA: Diagnosis not present

## 2016-09-19 DIAGNOSIS — K6389 Other specified diseases of intestine: Secondary | ICD-10-CM

## 2016-09-19 DIAGNOSIS — K639 Disease of intestine, unspecified: Secondary | ICD-10-CM | POA: Diagnosis not present

## 2016-09-19 DIAGNOSIS — K449 Diaphragmatic hernia without obstruction or gangrene: Secondary | ICD-10-CM | POA: Insufficient documentation

## 2016-09-19 LAB — POCT I-STAT CREATININE: CREATININE: 0.6 mg/dL (ref 0.44–1.00)

## 2016-09-19 MED ORDER — IOPAMIDOL (ISOVUE-300) INJECTION 61%
100.0000 mL | Freq: Once | INTRAVENOUS | Status: AC | PRN
  Administered 2016-09-19: 100 mL via INTRAVENOUS

## 2016-09-19 NOTE — Progress Notes (Signed)
 BP 114/72   Pulse 97   Temp (!) 97.1 F (36.2 C) (Oral)   Ht 4' 10" (1.473 m)   Wt 107 lb (48.5 kg)   BMI 22.36 kg/m    Subjective:    Patient ID: Ashley Peters, female    DOB: 09/10/1928, 83 y.o.   MRN: [id number]  HPI: Ashley Peters is a 83 y.o. female presenting on 09/19/2016 for Follow-up (pt here today for routine follow up of chronic medical conditions and c/o fatigue)   HPI Fatigue and right lower quadrant abdominal pain Patient has been having fatigue and weight loss that has increased over the past 3 weeks. She says over the past couple weeks she's noticed some abdominal pain in the right lower aspect of her abdomen and she feels a lump there that she says the lump wasn't there before but she also couldn't notice as well because she weighed a lot more previously. She denies any fevers but says she has had some chills intermittently and sporadically over the past few months and last night she had an episode of the same thing.  Hypertension Patient is currently on no medication because her blood pressure has improved with her weight loss, and their blood pressure today is 114/72. Patient denies any lightheadedness or dizziness. Patient denies headaches, blurred vision, chest pains, shortness of breath, or weakness. Denies any side effects from medication and is content with current medication.   Hyperlipidemia Patient is coming in for recheck of his hyperlipidemia. The patient is currently taking No medication and is diet controlled, has been having weight loss so it has been improving. We will recheck it today. They deny any issues with myalgias or history of liver damage from it. They deny any focal numbness or weakness or chest pain.   GERD Patient's GERD is controlled and she rarely has to use omeprazole like every few days. She denies any heartburn or belching or burping or blood in her stool.  Relevant past medical, surgical, family and social history reviewed and  updated as indicated. Interim medical history since our last visit reviewed. Allergies and medications reviewed and updated.  Review of Systems  Constitutional: Positive for fatigue and unexpected weight change. Negative for chills and fever.  Eyes: Negative for redness and visual disturbance.  Respiratory: Negative for chest tightness and shortness of breath.   Cardiovascular: Negative for chest pain and leg swelling.  Gastrointestinal: Positive for abdominal pain. Negative for blood in stool, constipation, diarrhea, nausea and vomiting.  Genitourinary: Negative for difficulty urinating.  Musculoskeletal: Negative for back pain and gait problem.  Skin: Negative for rash.  Neurological: Negative for light-headedness and headaches.  Psychiatric/Behavioral: Negative for agitation and behavioral problems.  All other systems reviewed and are negative.   Per HPI unless specifically indicated above        Objective:    BP 114/72   Pulse 97   Temp (!) 97.1 F (36.2 C) (Oral)   Ht 4' 10" (1.473 m)   Wt 107 lb (48.5 kg)   BMI 22.36 kg/m   Wt Readings from Last 3 Encounters:  09/19/16 107 lb (48.5 kg)  06/23/16 117 lb (53.1 kg)  06/19/16 117 lb 6.4 oz (53.3 kg)    Physical Exam  Constitutional: She is oriented to person, place, and time. She appears well-developed and well-nourished. No distress.  Eyes: Conjunctivae are normal.  Neck: Neck supple. No thyromegaly present.  Cardiovascular: Normal rate, regular rhythm, normal heart sounds and intact distal   pulses.   No murmur heard. Pulmonary/Chest: Effort normal and breath sounds normal. No respiratory distress. She has no wheezes.  Abdominal: Soft. Bowel sounds are normal. She exhibits mass (Right lower quadrant abdominal mass about the size of a softball). She exhibits no distension. There is no hepatosplenomegaly. There is tenderness in the right lower quadrant. There is no rigidity, no rebound, no guarding and no CVA tenderness.    Musculoskeletal: Normal range of motion. She exhibits no edema or tenderness.  Lymphadenopathy:    She has no cervical adenopathy.  Neurological: She is alert and oriented to person, place, and time. Coordination normal.  Skin: Skin is warm and dry. No rash noted. She is not diaphoretic.  Psychiatric: She has a normal mood and affect. Her behavior is normal.  Nursing note and vitals reviewed.       Assessment & Plan:   Problem List Items Addressed This Visit      Cardiovascular and Mediastinum   HTN (hypertension) - Primary   Relevant Orders   CMP14+EGFR     Digestive   GERD (gastroesophageal reflux disease)   Relevant Orders   CBC with Differential/Platelet     Other   Hyperlipidemia   Relevant Orders   Lipid panel    Other Visit Diagnoses    Other fatigue       Relevant Orders   CBC with Differential/Platelet   TSH   Right lower quadrant abdominal mass       Relevant Orders   CT Abdomen Pelvis W Contrast       Follow up plan: Return in about 6 months (around 03/22/2017), or if symptoms worsen or fail to improve, for Recheck hypertension and cholesterol.  Counseling provided for all of the vaccine components Orders Placed This Encounter  Procedures  . CMP14+EGFR  . CBC with Differential/Platelet  . Lipid panel  . TSH    Caryl Pina, MD [facility] 09/19/2016, 9:04 AM

## 2016-09-19 NOTE — Addendum Note (Signed)
Addended by: Caryl Pina on: 09/19/2016 09:30 AM   Modules accepted: Orders

## 2016-09-20 ENCOUNTER — Telehealth: Payer: Self-pay | Admitting: Family Medicine

## 2016-09-20 DIAGNOSIS — E875 Hyperkalemia: Secondary | ICD-10-CM

## 2016-09-20 LAB — CBC WITH DIFFERENTIAL/PLATELET
BASOS: 0 %
Basophils Absolute: 0 10*3/uL (ref 0.0–0.2)
EOS (ABSOLUTE): 0 10*3/uL (ref 0.0–0.4)
EOS: 0 %
HEMATOCRIT: 30.5 % — AB (ref 34.0–46.6)
Hemoglobin: 8.7 g/dL — CL (ref 11.1–15.9)
IMMATURE GRANULOCYTES: 0 %
Immature Grans (Abs): 0 10*3/uL (ref 0.0–0.1)
LYMPHS ABS: 1.3 10*3/uL (ref 0.7–3.1)
Lymphs: 14 %
MCH: 19.7 pg — ABNORMAL LOW (ref 26.6–33.0)
MCHC: 28.5 g/dL — ABNORMAL LOW (ref 31.5–35.7)
MCV: 69 fL — AB (ref 79–97)
MONOS ABS: 0.7 10*3/uL (ref 0.1–0.9)
Monocytes: 7 %
Neutrophils Absolute: 7.2 10*3/uL — ABNORMAL HIGH (ref 1.4–7.0)
Neutrophils: 79 %
Platelets: 544 10*3/uL — ABNORMAL HIGH (ref 150–379)
RBC: 4.41 x10E6/uL (ref 3.77–5.28)
RDW: 16.6 % — AB (ref 12.3–15.4)
WBC: 9.2 10*3/uL (ref 3.4–10.8)

## 2016-09-20 LAB — CMP14+EGFR
ALBUMIN: 3.1 g/dL — AB (ref 3.5–4.7)
ALT: 8 IU/L (ref 0–32)
AST: 14 IU/L (ref 0–40)
Albumin/Globulin Ratio: 1 — ABNORMAL LOW (ref 1.2–2.2)
Alkaline Phosphatase: 81 IU/L (ref 39–117)
BUN / CREAT RATIO: 30 — AB (ref 12–28)
BUN: 18 mg/dL (ref 8–27)
Bilirubin Total: 0.3 mg/dL (ref 0.0–1.2)
CALCIUM: 9 mg/dL (ref 8.7–10.3)
CO2: 20 mmol/L (ref 20–29)
CREATININE: 0.61 mg/dL (ref 0.57–1.00)
Chloride: 106 mmol/L (ref 96–106)
GFR calc Af Amer: 95 mL/min/{1.73_m2} (ref >59)
GFR, EST NON AFRICAN AMERICAN: 82 mL/min/{1.73_m2} (ref >59)
GLOBULIN, TOTAL: 3.1 g/dL (ref 1.5–4.5)
Glucose: 92 mg/dL (ref 65–99)
Potassium: 5.8 mmol/L — ABNORMAL HIGH (ref 3.5–5.2)
SODIUM: 141 mmol/L (ref 134–144)
Total Protein: 6.2 g/dL (ref 6.0–8.5)

## 2016-09-20 LAB — LIPID PANEL
CHOL/HDL RATIO: 2.6 ratio (ref 0.0–4.4)
Cholesterol, Total: 119 mg/dL (ref 100–199)
HDL: 46 mg/dL (ref >39)
LDL CALC: 51 mg/dL (ref 0–99)
Triglycerides: 110 mg/dL (ref 0–149)
VLDL Cholesterol Cal: 22 mg/dL (ref 5–40)

## 2016-09-20 LAB — TSH: TSH: 2.35 u[IU]/mL (ref 0.450–4.500)

## 2016-09-20 MED ORDER — FERROUS SULFATE 325 (65 FE) MG PO TABS: 325.0000 mg | ORAL_TABLET | Freq: Two times a day (BID) | ORAL | 3 refills | Status: DC

## 2016-09-20 NOTE — Telephone Encounter (Signed)
Called and discussee anemia.   Reviewed other labs too.   Mild hyperkalemia with normal renal function, recommend repeat BMP in 1 week. Labs are placed.  Patient with moderate anemia, microcytic, likely from chronic GI bleed, she has not seen any GI bleeding, or bleeding from any other sources. Start iron once daily, increase to twice daily if tolerated in 2 or 3 days. Recommended taking a stool softener with any constipation.  She has new diagnosis of colon cancer which is likely the source of chronic GI bleed.  Laroy Apple, MD [facility] 09/20/2016, 12:27 PM

## 2016-09-21 NOTE — Telephone Encounter (Signed)
Discussed with patient in person on 09/19/2012 after her CT scan results were called to [place] about the colon cancer. Caryl Pina, MD [facility] 09/21/2016, 9:44 PM

## 2016-09-29 ENCOUNTER — Ambulatory Visit (HOSPITAL_COMMUNITY): Payer: Medicare Other

## 2016-09-29 ENCOUNTER — Other Ambulatory Visit: Payer: Medicare Other

## 2016-09-29 DIAGNOSIS — I10 Essential (primary) hypertension: Secondary | ICD-10-CM | POA: Diagnosis not present

## 2016-09-30 LAB — BMP8+EGFR
BUN/Creatinine Ratio: 22 (ref 12–28)
BUN: 13 mg/dL (ref 8–27)
CALCIUM: 9.2 mg/dL (ref 8.7–10.3)
CHLORIDE: 101 mmol/L (ref 96–106)
CO2: 23 mmol/L (ref 20–29)
Creatinine, Ser: 0.58 mg/dL (ref 0.57–1.00)
GFR calc Af Amer: 97 mL/min/{1.73_m2} (ref >59)
GFR calc non Af Amer: 84 mL/min/{1.73_m2} (ref >59)
GLUCOSE: 90 mg/dL (ref 65–99)
POTASSIUM: 5.4 mmol/L — AB (ref 3.5–5.2)
Sodium: 138 mmol/L (ref 134–144)

## 2016-10-07 ENCOUNTER — Ambulatory Visit: Payer: Self-pay | Admitting: Surgery

## 2016-10-07 DIAGNOSIS — C182 Malignant neoplasm of ascending colon: Secondary | ICD-10-CM | POA: Diagnosis not present

## 2016-10-07 NOTE — H&P (Signed)
History of Present Illness Ashley Peters. Brittan Butterbaugh MD; 10/07/2016 4:52 PM) The patient is a 83 year old female who presents with a colonic mass. Referred by Dr. Vonna Kotyk Dettinger for right colon mass - probable cancer  This is an 83 year old female who presents for evaluation of right colon mass. This patient is independent and lives alone. She arrives her own vehicle. She is fairly active. Over the last year she has lost about 30 pounds but she states that she was trying to lose some weight by changing her diet. Over the last several months she has noticed a decreased level of energy. In March she had one episode of epigastric abdominal pain that this resolved. Recently, she has noticed some lower abdominal pain in the right lower quadrant and has palpated a firm mass in this area. She denies any fever or chills. She was found to be anemic at 8.7. White count was normal at 9.2. Platelet count 544. CMP was normal except for a potassium of 5.8 albumin 3.1. Subsequently she underwent CT scan of the abdomen and pelvis. This showed a 9.5 cm intraluminal mass in the cecum and ascending colon with tumor extension into the adjacent pericolonic fat. There is some right pericolonic and mesenteric lymphadenopathy. No other signs of metastases identified. She has a small epigastric ventral hernia. She denies any urinary symptoms or changes in bowel habits.  She is accompanied by her daughter her son and her son-in-law. Her last colonoscopy was in 2003.  CLINICAL DATA: Right lower quadrant pain, weight loss, and fatigue over past 3 weeks. Right lower quadrant palpable mass.  EXAM: CT ABDOMEN AND PELVIS WITH CONTRAST  TECHNIQUE: Multidetector CT imaging of the abdomen and pelvis was performed using the standard protocol following bolus administration of intravenous contrast.  CONTRAST: 127mL ISOVUE-300 IOPAMIDOL (ISOVUE-300) INJECTION 61%  COMPARISON: None.  FINDINGS: Lower Chest: No acute  findings.  Hepatobiliary: No masses identified. Prior cholecystectomy. Mild biliary ductal dilatation, likely due to prior cholecystectomy.  Pancreas: No mass or inflammatory changes.  Spleen: Within normal limits in size and appearance.  Adrenals/Urinary Tract: No masses identified. Bilateral renal cysts noted, largest arising from lower pole of left kidney and measuring 5.5 cm. No evidence of hydronephrosis.  Stomach/Bowel: Tiny hiatal hernia. Colonic diverticulosis, without radiographic evidence of diverticulitis.  A large intraluminal colonic mass is seen in the cecum and ascending colon which measures 9.5 x 7.1 cm, consistent with colon carcinoma. Extension into adjacent pericolonic fat is seen along the lateral wall on image 53/2. No evidence of bowel obstruction.  Vascular/Lymphatic: Multiple small sub-cm pericolonic lymph nodes are seen along the medial wall of the ascending colon and in the right abdominal mesentery, suspicious for lymph node metastases. Aortic atherosclerosis.  Reproductive: Prior hysterectomy noted. Adnexal regions are unremarkable in appearance.  Other: Small midline epigastric ventral hernia containing only fat. No evidence of herniated bowel loops.  Musculoskeletal: No suspicious bone lesions identified.  IMPRESSION: Large 9.5 cm intraluminal mass in the cecum and ascending colon, consistent with colon carcinoma. Tumor extension into adjacent pericolonic fat seen along the lateral wall.  Mild right pericolonic and mesenteric lymphadenopathy, suspicious for metastatic disease. No other sites of metastatic disease identified.  Colonic diverticulosis, without radiographic evidence of diverticulitis. Tiny hiatal hernia, and small epigastric ventral hernia containing only fat.  Aortic atherosclerosis.  These results will be called to the ordering clinician or representative by the Radiologist Assistant, and communication documented in the  PACS or zVision Dashboard.   Electronically Signed By:  Earle Gell M.D. On: 09/19/2016 13:40    Past Surgical History Dalbert Mayotte, [place]; 10/07/2016 2:36 PM) Breast Biopsy Bilateral. multiple Cataract Surgery Bilateral. Gallbladder Surgery - Open Hysterectomy (not due to cancer) - Partial  Diagnostic Studies History Dalbert Mayotte, [place]; 10/07/2016 2:36 PM) Colonoscopy >10 years ago Mammogram within last year Pap Smear >5 years ago  Allergies Dalbert Mayotte, [place]; 10/07/2016 2:36 PM) No Known Drug Allergies 10/07/2016 Allergies Reconciled  Medication History Dalbert Mayotte, CMA; 10/07/2016 2:37 PM) Ferrous Sulfate (325 (65 Fe)MG Tablet, Oral) Active. Vitamin D (Oral) Specific strength unknown - Active. Tylenol (1 (one) Oral) Specific strength unknown - Active. Medications Reconciled  Social History Dalbert Mayotte, [place]; 10/07/2016 2:36 PM) Caffeine use Carbonated beverages. No alcohol use No drug use Tobacco use Never smoker.  Family History Dalbert Mayotte, [place]; 10/07/2016 2:36 PM) Breast Cancer Sister. Diabetes Mellitus Brother. Kidney Disease Brother. Seizure disorder Son.  Pregnancy / Birth History Dalbert Mayotte, [place]; 10/07/2016 2:36 PM) Gravida 4 Maternal age 65-30 Para 3  Other Problems Dalbert Mayotte, [place]; 10/07/2016 2:36 PM) No pertinent past medical history     Review of Systems Dalbert Mayotte CMA; 10/07/2016 2:36 PM) General Present- Fatigue. Not Present- Appetite Loss, Chills, Fever, Night Sweats, Weight Gain and Weight Loss. Skin Not Present- Change in Wart/Mole, Dryness, Hives, Jaundice, New Lesions, Non-Healing Wounds, Rash and Ulcer. HEENT Not Present- Earache, Hearing Loss, Hoarseness, Nose Bleed, Oral Ulcers, Ringing in the Ears, Seasonal Allergies, Sinus Pain, Sore Throat, Visual Disturbances, Wears glasses/contact lenses and Yellow Eyes. Respiratory Not Present- Bloody sputum, Chronic Cough, Difficulty Breathing,  Snoring and Wheezing. Breast Not Present- Breast Mass, Breast Pain, Nipple Discharge and Skin Changes. Cardiovascular Not Present- Chest Pain, Difficulty Breathing Lying Down, Leg Cramps, Palpitations, Rapid Heart Rate, Shortness of Breath and Swelling of Extremities. Gastrointestinal Present- Abdominal Pain. Not Present- Bloating, Bloody Stool, Change in Bowel Habits, Chronic diarrhea, Constipation, Difficulty Swallowing, Excessive gas, Gets full quickly at meals, Hemorrhoids, Indigestion, Nausea, Rectal Pain and Vomiting. Female Genitourinary Not Present- Frequency, Nocturia, Painful Urination, Pelvic Pain and Urgency. Musculoskeletal Not Present- Back Pain, Joint Pain, Joint Stiffness, Muscle Pain, Muscle Weakness and Swelling of Extremities. Neurological Not Present- Decreased Memory, Fainting, Headaches, Numbness, Seizures, Tingling, Tremor, Trouble walking and Weakness. Psychiatric Not Present- Anxiety, Bipolar, Change in Sleep Pattern, Depression, Fearful and Frequent crying. Endocrine Not Present- Cold Intolerance, Excessive Hunger, Hair Changes, Heat Intolerance, Hot flashes and New Diabetes. Hematology Not Present- Blood Thinners, Easy Bruising, Excessive bleeding, Gland problems, HIV and Persistent Infections.  Vitals Dalbert Mayotte CMA; 10/07/2016 2:38 PM) 10/07/2016 2:38 PM Weight: 107.2 lb Height: 60in Body Surface Area: 1.43 m Body Mass Index: 20.94 kg/m  Temp.: 98.42F  Pulse: 105 (Regular)  BP: 120/64 (Sitting, Left Arm, Standard)      Physical Exam Rodman Key K. Ayven Pheasant MD; 10/07/2016 4:53 PM)  The physical exam findings are as follows: Note:Elderly female in NAD Eyes: Pupils equal, round; sclera anicteric HENT: Oral mucosa moist; good dentition Neck: No masses palpated, no thyromegaly Lungs: CTA bilaterally; normal respiratory effort CV: Regular rate and rhythm; no murmurs; extremities well-perfused with no edema Abd: +bowel sounds, soft, mildly tender in  RLQ; large firm palpable mass in RLQ - slightly mobile, does not appear to be fixed to surrounding abdominal wall Skin: Warm, dry; no sign of jaundice Psychiatric - alert and oriented x 4; calm mood and affect    Assessment & Plan Rodman Key K. Kaisha Wachob MD; 10/07/2016 4:55 PM)  CANCER OF RIGHT COLON (C18.2)  Current Plans Schedule for  Surgery - Open right hemicolectomy/ repair of ventral hernia. The surgical procedure has been discussed with the patient. Potential risks, benefits, alternative treatments, and expected outcomes have been explained. All of the patient's questions at this time have been answered. The likelihood of reaching the patient's treatment goal is good. The patient understand the proposed surgical procedure and wishes to proceed.  I spent some time with the patient and her family discussed in the upcoming procedure. There is no indication for colonoscopy as this would not change our surgical management. There is risk of developing obstruction if the tumor continues to grow unabated. The patient has a fairly good functional status and should be able to tolerate surgery. We will try to minimize her narcotic use by using adjunct pain management. We discussed benefits and risks of the procedure. She may require transfusion after surgery and she is already anemic. We'll perform a 1 day MiraLAX bowel prep.  Ashley Peters. Georgette Dover, MD, [facility]  General/ Trauma Surgery  10/07/2016 4:55 PM

## 2016-10-10 NOTE — Pre-Procedure Instructions (Signed)
Ashley Peters  10/10/2016      [facility] - [place], [place] [address] [place] [address] Phone: [phone] Fax: [phone]  [facility] [place], [place] [address] Phone: [phone] Fax: [phone]  [facility] [place], [place] [address] [place] [address] Phone: [phone] Fax: [phone]  [id number] - [place], [place] [address] Phone: [phone] Fax: [phone]    Your procedure is scheduled on October 14, 2016.  Report to [facility] at [employer] PM.  Call this number if you have problems the morning of surgery:  [phone]   Remember:  Do not eat food or drink liquids after midnight.  Take these medicines the morning of surgery with A SIP OF WATER acetaminophen (tylenol), omeprazole (prilosec).   7 days prior to surgery STOP taking any Aspirin, Aleve, Naproxen, Ibuprofen, Motrin, Advil, Goody's, BC's, all herbal medications, fish oil, and all vitamins   Do not wear jewelry, make-up or nail polish.  Do not wear lotions, powders, or perfumes, or deoderant.  Do not shave 48 hours prior to surgery.    Do not bring valuables to the hospital.  [facility] is not responsible for any belongings or valuables.  Contacts, dentures or bridgework may not be worn into surgery.  Leave your suitcase in the car.  After surgery it may be brought to your room.  For patients admitted to the hospital, discharge time will be determined by your treatment team.  Patients discharged the day of surgery will not be allowed to drive home.   Special instructions:   [place]- Preparing For Surgery  Before surgery, you can play an important role. Because skin is not sterile, your skin needs to be as free of germs as possible. You can  reduce the number of germs on your skin by washing with CHG (chlorahexidine gluconate) Soap before surgery.  CHG is an antiseptic cleaner which kills germs and bonds with the skin to continue killing germs even after washing.  Please do not use if you have an allergy to CHG or antibacterial soaps. If your skin becomes reddened/irritated stop using the CHG.  Do not shave (including legs and underarms) for at least 48 hours prior to first CHG shower. It is OK to shave your face.  Please follow these instructions carefully.   1. Shower the NIGHT BEFORE SURGERY and the MORNING OF SURGERY with CHG.   2. If you chose to wash your hair, wash your hair first as usual with your normal shampoo.  3. After you shampoo, rinse your hair and body thoroughly to remove the shampoo.  4. Use CHG as you would any other liquid soap. You can apply CHG directly to the skin and wash gently with a scrungie or a clean washcloth.   5. Apply the CHG Soap to your body ONLY FROM THE NECK DOWN.  Do not use on open wounds or open sores. Avoid contact with your eyes, ears, mouth and genitals (private parts). Wash genitals (private parts) with your normal soap.  6. Wash thoroughly, paying special attention to the area where your surgery will be performed.  7. Thoroughly rinse your body with warm water from the neck down.  8. DO NOT shower/wash with your  normal soap after using and rinsing off the CHG Soap.  9. Pat yourself dry with a CLEAN TOWEL.   10. Wear CLEAN PAJAMAS   11. Place CLEAN SHEETS on your bed the night of your first shower and DO NOT SLEEP WITH PETS.    Day of Surgery: Do not apply any deodorants/lotions. Please wear clean clothes to the hospital/surgery center.     Please read over the following fact sheets that you were given. Pain Booklet, Coughing and Deep Breathing and Surgical Site Infection Prevention

## 2016-10-13 ENCOUNTER — Encounter (HOSPITAL_COMMUNITY)
Admission: RE | Admit: 2016-10-13 | Discharge: 2016-10-13 | Disposition: A | Discharge status: Home / Self Care | Payer: Medicare Other | Source: Ambulatory Visit | Attending: Surgery | Admitting: Surgery

## 2016-10-13 ENCOUNTER — Encounter (HOSPITAL_COMMUNITY): Payer: Self-pay

## 2016-10-13 DIAGNOSIS — Z9049 Acquired absence of other specified parts of digestive tract: Secondary | ICD-10-CM | POA: Insufficient documentation

## 2016-10-13 DIAGNOSIS — Z90710 Acquired absence of both cervix and uterus: Secondary | ICD-10-CM

## 2016-10-13 DIAGNOSIS — D649 Anemia, unspecified: Secondary | ICD-10-CM

## 2016-10-13 DIAGNOSIS — E785 Hyperlipidemia, unspecified: Secondary | ICD-10-CM

## 2016-10-13 DIAGNOSIS — K219 Gastro-esophageal reflux disease without esophagitis: Secondary | ICD-10-CM | POA: Insufficient documentation

## 2016-10-13 DIAGNOSIS — Z85038 Personal history of other malignant neoplasm of large intestine: Secondary | ICD-10-CM

## 2016-10-13 DIAGNOSIS — I10 Essential (primary) hypertension: Secondary | ICD-10-CM

## 2016-10-13 DIAGNOSIS — Z01812 Encounter for preprocedural laboratory examination: Secondary | ICD-10-CM | POA: Insufficient documentation

## 2016-10-13 HISTORY — DX: Malignant (primary) neoplasm, unspecified: C80.1

## 2016-10-13 HISTORY — DX: Anemia, unspecified: D64.9

## 2016-10-13 HISTORY — DX: Personal history of urinary calculi: Z87.442

## 2016-10-13 LAB — CBC
HCT: 28.6 % — ABNORMAL LOW (ref 36.0–46.0)
Hemoglobin: 8.4 g/dL — ABNORMAL LOW (ref 12.0–15.0)
MCH: 20.1 pg — AB (ref 26.0–34.0)
MCHC: 29.4 g/dL — AB (ref 30.0–36.0)
MCV: 68.4 fL — ABNORMAL LOW (ref 78.0–100.0)
Platelets: 491 10*3/uL — ABNORMAL HIGH (ref 150–400)
RBC: 4.18 MIL/uL (ref 3.87–5.11)
RDW: 18.4 % — AB (ref 11.5–15.5)
WBC: 7.9 10*3/uL (ref 4.0–10.5)

## 2016-10-13 LAB — BASIC METABOLIC PANEL
Anion gap: 8 (ref 5–15)
BUN: 13 mg/dL (ref 6–20)
CALCIUM: 9.2 mg/dL (ref 8.9–10.3)
CHLORIDE: 105 mmol/L (ref 101–111)
CO2: 25 mmol/L (ref 22–32)
CREATININE: 0.56 mg/dL (ref 0.44–1.00)
GFR calc non Af Amer: >60 mL/min (ref >60)
Glucose, Bld: 94 mg/dL (ref 65–99)
Potassium: 3.7 mmol/L (ref 3.5–5.1)
SODIUM: 138 mmol/L (ref 135–145)

## 2016-10-13 LAB — ABO/RH: ABO/RH(D): O POS

## 2016-10-13 LAB — PREPARE RBC (CROSSMATCH)

## 2016-10-13 NOTE — Progress Notes (Signed)
PCP - Dr. Warrick Parisian Cardiologist - patient denies  Chest x-ray - n/a EKG - 06/04/2016 Stress Test - patient denies ECHO - patient denies Cardiac Cath - patient denies  Sleep Study - patient denies   Patient denies shortness of breath, fever, cough and chest pain at PAT appointment, therefore no CXR needed.   Patient verbalized understanding of instructions that were given to them at the PAT appointment. Patient was also instructed that they will need to review over the PAT instructions again at home before surgery.  Patient verified she was given instructions for bowel prep from the office.

## 2016-10-13 NOTE — Progress Notes (Signed)
Patient's Hgb came back at 8.4.  Ebony Hail with anesthesia informed.  Left message with Tawni Pummel, RN at Dr. Vonna Kotyk office informing them of patient's hgb as well.

## 2016-10-13 NOTE — Progress Notes (Signed)
Anesthesia chart review: Patient is an 83 year old female scheduled for open right hemicolectomy, repair of epigastric ventral hernia on 10/14/2016 by Dr. Donnie Mesa.  History includes never smoker, colon cancer, anemia, hypertension, hyperlipidemia, GERD, macular degeneration, cholecystectomy, hysterectomy.  PCP is Dr. Vonna Kotyk Dettinger. With [employer] [facility].  Meds include ferrous sulfate, Prilosec.  BP (!) 123/50   Pulse (!) 50   Temp 36.7 C   Resp 18   Ht 4\' 9"  (1.448 m)   Wt 105 lb 3.2 oz (47.7 kg)   SpO2 100%   BMI 22.77 kg/m   EKG 06/04/16: SR, low voltage in limb leads, poor r wave progression, consider pulmonary disease patter or anterior infarct.   Negative stress test, EF 74% on 05/05/01.   CT abd/pelvis 09/19/16: IMPRESSION: - Large 9.5 cm intraluminal mass in the cecum and ascending colon, consistent with colon carcinoma. Tumor extension into adjacent pericolonic fat seen along the lateral wall. - Mild right pericolonic and mesenteric lymphadenopathy, suspicious for metastatic disease. No other sites of metastatic disease identified. - Colonic diverticulosis, without radiographic evidence of diverticulitis. Tiny hiatal hernia, and small epigastric ventral hernia containing only fat. - Aortic atherosclerosis.  Preoperative labs noted. BMET WNL. H/H 8.4/28.6, down from 8.7/30.5 on 09/19/16 (when she presented with abdominal pain and fatigue and CT showed colon mass; prior to symptoms, H/H 11.1/34.2 on 02/04/16. ). PAT RN called result to Dr. Vonna Kotyk office. Anemia likely related to chronic GI bleed from colon mass, suspected cancer. I will order Prepare PRBC 2 Units, but will defer decision to transfuse to surgery and/or anesthesiologist.  Myra Gianotti, PA-C [facility]/Anesthesiology Phone [phone] 10/13/2016 12:04 PM

## 2016-10-14 ENCOUNTER — Encounter (HOSPITAL_COMMUNITY)
Admission: RE | Disposition: A | Discharge status: Home / Self Care | Payer: Self-pay | Source: Ambulatory Visit | Attending: Surgery

## 2016-10-14 ENCOUNTER — Inpatient Hospital Stay (HOSPITAL_COMMUNITY)
Admission: RE | Admit: 2016-10-14 | Discharge: 2016-10-17 | DRG: 331 | Disposition: A | Discharge status: Home / Self Care | Payer: Medicare Other | Source: Ambulatory Visit | Attending: Surgery | Admitting: Surgery

## 2016-10-14 ENCOUNTER — Inpatient Hospital Stay (HOSPITAL_COMMUNITY): Payer: Medicare Other | Admitting: Anesthesiology

## 2016-10-14 ENCOUNTER — Inpatient Hospital Stay (HOSPITAL_COMMUNITY): Payer: Medicare Other | Admitting: Vascular Surgery

## 2016-10-14 ENCOUNTER — Encounter (HOSPITAL_COMMUNITY): Payer: Self-pay | Admitting: *Deleted

## 2016-10-14 DIAGNOSIS — E785 Hyperlipidemia, unspecified: Secondary | ICD-10-CM | POA: Diagnosis not present

## 2016-10-14 DIAGNOSIS — C189 Malignant neoplasm of colon, unspecified: Secondary | ICD-10-CM | POA: Diagnosis not present

## 2016-10-14 DIAGNOSIS — Z841 Family history of disorders of kidney and ureter: Secondary | ICD-10-CM | POA: Diagnosis not present

## 2016-10-14 DIAGNOSIS — I700 Atherosclerosis of aorta: Secondary | ICD-10-CM | POA: Diagnosis present

## 2016-10-14 DIAGNOSIS — D649 Anemia, unspecified: Secondary | ICD-10-CM | POA: Diagnosis present

## 2016-10-14 DIAGNOSIS — Z833 Family history of diabetes mellitus: Secondary | ICD-10-CM

## 2016-10-14 DIAGNOSIS — I10 Essential (primary) hypertension: Secondary | ICD-10-CM | POA: Diagnosis not present

## 2016-10-14 DIAGNOSIS — K432 Incisional hernia without obstruction or gangrene: Secondary | ICD-10-CM | POA: Diagnosis present

## 2016-10-14 DIAGNOSIS — K5730 Diverticulosis of large intestine without perforation or abscess without bleeding: Secondary | ICD-10-CM | POA: Diagnosis present

## 2016-10-14 DIAGNOSIS — N281 Cyst of kidney, acquired: Secondary | ICD-10-CM | POA: Diagnosis present

## 2016-10-14 DIAGNOSIS — C180 Malignant neoplasm of cecum: Secondary | ICD-10-CM | POA: Diagnosis not present

## 2016-10-14 DIAGNOSIS — K660 Peritoneal adhesions (postprocedural) (postinfection): Secondary | ICD-10-CM | POA: Diagnosis present

## 2016-10-14 DIAGNOSIS — C182 Malignant neoplasm of ascending colon: Secondary | ICD-10-CM | POA: Diagnosis not present

## 2016-10-14 DIAGNOSIS — Z820 Family history of epilepsy and other diseases of the nervous system: Secondary | ICD-10-CM

## 2016-10-14 DIAGNOSIS — Z90710 Acquired absence of both cervix and uterus: Secondary | ICD-10-CM

## 2016-10-14 DIAGNOSIS — Z803 Family history of malignant neoplasm of breast: Secondary | ICD-10-CM | POA: Diagnosis not present

## 2016-10-14 DIAGNOSIS — K439 Ventral hernia without obstruction or gangrene: Secondary | ICD-10-CM | POA: Diagnosis not present

## 2016-10-14 HISTORY — PX: EPIGASTRIC HERNIA REPAIR: SHX404

## 2016-10-14 HISTORY — PX: PARTIAL COLECTOMY: SHX5273

## 2016-10-14 LAB — PREPARE RBC (CROSSMATCH)

## 2016-10-14 LAB — CEA: CEA1: 27.5 ng/mL — AB (ref 0.0–4.7)

## 2016-10-14 SURGERY — COLECTOMY, PARTIAL
Anesthesia: General | Site: Abdomen | Laterality: Right

## 2016-10-14 MED ORDER — SCOPOLAMINE 1 MG/3DAYS TD PT72
MEDICATED_PATCH | TRANSDERMAL | Status: AC
  Administered 2016-10-14: 1.5 mg via TRANSDERMAL
  Filled 2016-10-14: qty 1

## 2016-10-14 MED ORDER — KETOROLAC TROMETHAMINE 15 MG/ML IJ SOLN
INTRAMUSCULAR | Status: DC | PRN
  Administered 2016-10-14: 15 mg via INTRAVENOUS

## 2016-10-14 MED ORDER — EVICEL 5 ML EX KIT: PACK | CUTANEOUS | Status: DC | PRN

## 2016-10-14 MED ORDER — ALVIMOPAN 12 MG PO CAPS
12.0000 mg | ORAL_CAPSULE | ORAL | Status: AC
  Administered 2016-10-14: 12 mg via ORAL
  Filled 2016-10-14: qty 1

## 2016-10-14 MED ORDER — EPHEDRINE 5 MG/ML INJ
INTRAVENOUS | Status: AC
  Filled 2016-10-14: qty 10

## 2016-10-14 MED ORDER — EPHEDRINE SULFATE-NACL 50-0.9 MG/10ML-% IV SOSY
PREFILLED_SYRINGE | INTRAVENOUS | Status: DC | PRN
  Administered 2016-10-14: 10 mg via INTRAVENOUS

## 2016-10-14 MED ORDER — PANTOPRAZOLE SODIUM 40 MG IV SOLR
40.0000 mg | Freq: Every day | INTRAVENOUS | Status: DC
  Administered 2016-10-14 – 2016-10-15 (×2): 40 mg via INTRAVENOUS
  Filled 2016-10-14 (×2): qty 40

## 2016-10-14 MED ORDER — ALVIMOPAN 12 MG PO CAPS
12.0000 mg | ORAL_CAPSULE | Freq: Two times a day (BID) | ORAL | Status: DC
  Administered 2016-10-15 (×2): 12 mg via ORAL
  Filled 2016-10-14 (×3): qty 1

## 2016-10-14 MED ORDER — SUGAMMADEX SODIUM 200 MG/2ML IV SOLN
INTRAVENOUS | Status: AC
  Filled 2016-10-14: qty 2

## 2016-10-14 MED ORDER — DEXAMETHASONE SODIUM PHOSPHATE 10 MG/ML IJ SOLN
INTRAMUSCULAR | Status: DC | PRN
  Administered 2016-10-14: 4 mg via INTRAVENOUS

## 2016-10-14 MED ORDER — KETAMINE HCL 10 MG/ML IJ SOLN
INTRAMUSCULAR | Status: DC | PRN
  Administered 2016-10-14: 20 mg via INTRAVENOUS

## 2016-10-14 MED ORDER — MORPHINE SULFATE (PF) 4 MG/ML IV SOLN
2.0000 mg | INTRAVENOUS | Status: DC | PRN
  Filled 2016-10-14: qty 1

## 2016-10-14 MED ORDER — BUPIVACAINE-EPINEPHRINE 0.25% -1:200000 IJ SOLN: INTRAMUSCULAR | Status: DC | PRN

## 2016-10-14 MED ORDER — LIDOCAINE 2% (20 MG/ML) 5 ML SYRINGE
INTRAMUSCULAR | Status: AC
  Filled 2016-10-14: qty 5

## 2016-10-14 MED ORDER — PHENYLEPHRINE 40 MCG/ML (10ML) SYRINGE FOR IV PUSH (FOR BLOOD PRESSURE SUPPORT)
PREFILLED_SYRINGE | INTRAVENOUS | Status: AC
  Filled 2016-10-14: qty 10

## 2016-10-14 MED ORDER — ONDANSETRON HCL 4 MG/2ML IJ SOLN
INTRAMUSCULAR | Status: AC
  Filled 2016-10-14: qty 2

## 2016-10-14 MED ORDER — ACETAMINOPHEN 500 MG PO TABS
1000.0000 mg | ORAL_TABLET | ORAL | Status: AC
  Administered 2016-10-14: 1000 mg via ORAL
  Filled 2016-10-14: qty 2

## 2016-10-14 MED ORDER — SODIUM CHLORIDE 0.9 % IV SOLN
INTRAVENOUS | Status: DC | PRN
  Administered 2016-10-14: 16:00:00 via INTRAVENOUS

## 2016-10-14 MED ORDER — KETOROLAC TROMETHAMINE 15 MG/ML IJ SOLN
15.0000 mg | Freq: Four times a day (QID) | INTRAMUSCULAR | Status: DC
  Administered 2016-10-14 – 2016-10-17 (×9): 15 mg via INTRAVENOUS
  Filled 2016-10-14 (×9): qty 1

## 2016-10-14 MED ORDER — ONDANSETRON 4 MG PO TBDP: 4.0000 mg | ORAL_TABLET | Freq: Four times a day (QID) | ORAL | Status: DC | PRN

## 2016-10-14 MED ORDER — BUPIVACAINE LIPOSOME 1.3 % IJ SUSP
INTRAMUSCULAR | Status: DC | PRN
  Administered 2016-10-14: 20 mL

## 2016-10-14 MED ORDER — KETOROLAC TROMETHAMINE 30 MG/ML IJ SOLN
INTRAMUSCULAR | Status: AC
  Filled 2016-10-14: qty 1

## 2016-10-14 MED ORDER — GABAPENTIN 300 MG PO CAPS
300.0000 mg | ORAL_CAPSULE | ORAL | Status: AC
Start: 2016-10-15 — End: 2016-10-15
  Administered 2016-10-14: 300 mg via ORAL
  Filled 2016-10-14: qty 1

## 2016-10-14 MED ORDER — METHOCARBAMOL 500 MG PO TABS
500.0000 mg | ORAL_TABLET | Freq: Four times a day (QID) | ORAL | Status: DC | PRN
  Filled 2016-10-14: qty 1

## 2016-10-14 MED ORDER — PHENYLEPHRINE HCL 10 MG/ML IJ SOLN
INTRAMUSCULAR | Status: DC | PRN
  Administered 2016-10-14: 30 ug/min via INTRAVENOUS

## 2016-10-14 MED ORDER — ACETAMINOPHEN 500 MG PO TABS
1000.0000 mg | ORAL_TABLET | Freq: Four times a day (QID) | ORAL | Status: DC
  Administered 2016-10-14 – 2016-10-17 (×9): 1000 mg via ORAL
  Filled 2016-10-14 (×10): qty 2

## 2016-10-14 MED ORDER — DIPHENHYDRAMINE HCL 12.5 MG/5ML PO ELIX: 12.5000 mg | ORAL_SOLUTION | Freq: Four times a day (QID) | ORAL | Status: DC | PRN

## 2016-10-14 MED ORDER — PROPOFOL 10 MG/ML IV BOLUS
INTRAVENOUS | Status: DC | PRN
  Administered 2016-10-14: 120 mg via INTRAVENOUS

## 2016-10-14 MED ORDER — BUPIVACAINE-EPINEPHRINE 0.25% -1:200000 IJ SOLN
INTRAMUSCULAR | Status: AC
  Filled 2016-10-14: qty 1

## 2016-10-14 MED ORDER — LACTATED RINGERS IV SOLN
Freq: Once | INTRAVENOUS | Status: AC
  Administered 2016-10-14: 13:00:00 via INTRAVENOUS

## 2016-10-14 MED ORDER — CHLORHEXIDINE GLUCONATE CLOTH 2 % EX PADS: 6.0000 | MEDICATED_PAD | Freq: Once | CUTANEOUS | Status: DC

## 2016-10-14 MED ORDER — GABAPENTIN 300 MG PO CAPS
300.0000 mg | ORAL_CAPSULE | Freq: Two times a day (BID) | ORAL | Status: DC
  Administered 2016-10-14 – 2016-10-17 (×6): 300 mg via ORAL
  Filled 2016-10-14 (×6): qty 1

## 2016-10-14 MED ORDER — LIDOCAINE 2% (20 MG/ML) 5 ML SYRINGE
INTRAMUSCULAR | Status: DC | PRN
  Administered 2016-10-14: 100 mg via INTRAVENOUS

## 2016-10-14 MED ORDER — PHENYLEPHRINE 40 MCG/ML (10ML) SYRINGE FOR IV PUSH (FOR BLOOD PRESSURE SUPPORT)
PREFILLED_SYRINGE | INTRAVENOUS | Status: DC | PRN
  Administered 2016-10-14 (×2): 40 ug via INTRAVENOUS
  Administered 2016-10-14 (×2): 80 ug via INTRAVENOUS

## 2016-10-14 MED ORDER — SUGAMMADEX SODIUM 200 MG/2ML IV SOLN
INTRAVENOUS | Status: DC | PRN
  Administered 2016-10-14: 50 mg via INTRAVENOUS
  Administered 2016-10-14: 100 mg via INTRAVENOUS

## 2016-10-14 MED ORDER — ONDANSETRON HCL 4 MG/2ML IJ SOLN
INTRAMUSCULAR | Status: DC | PRN
  Administered 2016-10-14: 4 mg via INTRAVENOUS

## 2016-10-14 MED ORDER — SCOPOLAMINE 1 MG/3DAYS TD PT72
1.0000 | MEDICATED_PATCH | TRANSDERMAL | Status: DC
  Administered 2016-10-14: 1.5 mg via TRANSDERMAL

## 2016-10-14 MED ORDER — FENTANYL CITRATE (PF) 250 MCG/5ML IJ SOLN
INTRAMUSCULAR | Status: AC
  Filled 2016-10-14: qty 5

## 2016-10-14 MED ORDER — LACTATED RINGERS IV SOLN
INTRAVENOUS | Status: DC | PRN
  Administered 2016-10-14: 15:00:00 via INTRAVENOUS

## 2016-10-14 MED ORDER — FENTANYL CITRATE (PF) 250 MCG/5ML IJ SOLN
INTRAMUSCULAR | Status: DC | PRN
  Administered 2016-10-14: 150 ug via INTRAVENOUS

## 2016-10-14 MED ORDER — CEFOTETAN DISODIUM-DEXTROSE 2-2.08 GM-% IV SOLR
INTRAVENOUS | Status: AC
  Filled 2016-10-14: qty 50

## 2016-10-14 MED ORDER — EVICEL 5 ML EX KIT
PACK | CUTANEOUS | Status: AC
  Filled 2016-10-14: qty 1

## 2016-10-14 MED ORDER — LACTATED RINGERS IV SOLN
INTRAVENOUS | Status: DC
  Administered 2016-10-14 – 2016-10-15 (×2): via INTRAVENOUS

## 2016-10-14 MED ORDER — DIPHENHYDRAMINE HCL 50 MG/ML IJ SOLN: 12.5000 mg | Freq: Four times a day (QID) | INTRAMUSCULAR | Status: DC | PRN

## 2016-10-14 MED ORDER — ROCURONIUM BROMIDE 10 MG/ML (PF) SYRINGE
PREFILLED_SYRINGE | INTRAVENOUS | Status: DC | PRN
  Administered 2016-10-14: 20 mg via INTRAVENOUS
  Administered 2016-10-14: 50 mg via INTRAVENOUS

## 2016-10-14 MED ORDER — ENOXAPARIN SODIUM 30 MG/0.3ML ~~LOC~~ SOLN
30.0000 mg | SUBCUTANEOUS | Status: DC
  Administered 2016-10-15 – 2016-10-17 (×3): 30 mg via SUBCUTANEOUS
  Filled 2016-10-14 (×3): qty 0.3

## 2016-10-14 MED ORDER — HYDROMORPHONE HCL 1 MG/ML IJ SOLN: 0.2500 mg | INTRAMUSCULAR | Status: DC | PRN

## 2016-10-14 MED ORDER — PROPOFOL 10 MG/ML IV BOLUS
INTRAVENOUS | Status: AC
  Filled 2016-10-14: qty 20

## 2016-10-14 MED ORDER — BUPIVACAINE LIPOSOME 1.3 % IJ SUSP
20.0000 mL | Freq: Once | INTRAMUSCULAR | Status: DC
  Filled 2016-10-14: qty 20

## 2016-10-14 MED ORDER — PROMETHAZINE HCL 25 MG/ML IJ SOLN: 6.2500 mg | INTRAMUSCULAR | Status: DC | PRN

## 2016-10-14 MED ORDER — DEXTROSE 5 % IV SOLN
2.0000 g | Freq: Two times a day (BID) | INTRAVENOUS | Status: AC
  Administered 2016-10-15: 2 g via INTRAVENOUS
  Filled 2016-10-14 (×2): qty 2

## 2016-10-14 MED ORDER — KETAMINE HCL-SODIUM CHLORIDE 100-0.9 MG/10ML-% IV SOSY
PREFILLED_SYRINGE | INTRAVENOUS | Status: AC
  Filled 2016-10-14: qty 10

## 2016-10-14 MED ORDER — 0.9 % SODIUM CHLORIDE (POUR BTL) OPTIME
TOPICAL | Status: DC | PRN
  Administered 2016-10-14 (×4): 1000 mL

## 2016-10-14 MED ORDER — SODIUM CHLORIDE 0.9 % IV SOLN: 10.0000 mL/h | Freq: Once | INTRAVENOUS | Status: DC

## 2016-10-14 MED ORDER — CEFOTETAN DISODIUM-DEXTROSE 2-2.08 GM-% IV SOLR
2.0000 g | INTRAVENOUS | Status: AC
  Administered 2016-10-14: 2 g via INTRAVENOUS

## 2016-10-14 MED ORDER — ONDANSETRON HCL 4 MG/2ML IJ SOLN: 4.0000 mg | Freq: Four times a day (QID) | INTRAMUSCULAR | Status: DC | PRN

## 2016-10-14 MED ORDER — ROCURONIUM BROMIDE 10 MG/ML (PF) SYRINGE
PREFILLED_SYRINGE | INTRAVENOUS | Status: AC
  Filled 2016-10-14: qty 5

## 2016-10-14 MED ORDER — LACTATED RINGERS IV SOLN
INTRAVENOUS | Status: DC | PRN
  Administered 2016-10-14: 14:00:00 via INTRAVENOUS

## 2016-10-14 SURGICAL SUPPLY — 73 items
APL SKNCLS STERI-STRIP NONHPOA (GAUZE/BANDAGES/DRESSINGS) ×2
BENZOIN TINCTURE PRP APPL 2/3 (GAUZE/BANDAGES/DRESSINGS) ×4 IMPLANT
BLADE CLIPPER SURG (BLADE) ×2 IMPLANT
CANISTER SUCT 3000ML PPV (MISCELLANEOUS) ×4 IMPLANT
CHLORAPREP W/TINT 26ML (MISCELLANEOUS) ×4 IMPLANT
CLOSURE WOUND 1/2 X4 (GAUZE/BANDAGES/DRESSINGS) ×1
COVER MAYO STAND STRL (DRAPES) ×8 IMPLANT
COVER SURGICAL LIGHT HANDLE (MISCELLANEOUS) ×4 IMPLANT
DRAPE HALF SHEET 40X57 (DRAPES) ×8 IMPLANT
DRAPE LAPAROSCOPIC ABDOMINAL (DRAPES) ×4 IMPLANT
DRAPE UTILITY XL STRL (DRAPES) ×20 IMPLANT
DRAPE WARM FLUID 44X44 (DRAPE) ×4 IMPLANT
DRSG OPSITE POSTOP 4X10 (GAUZE/BANDAGES/DRESSINGS) ×2 IMPLANT
DRSG OPSITE POSTOP 4X8 (GAUZE/BANDAGES/DRESSINGS) IMPLANT
DRSG TEGADERM 4X4.75 (GAUZE/BANDAGES/DRESSINGS) ×4 IMPLANT
ELECT BLADE 6.5 EXT (BLADE) ×4 IMPLANT
ELECT CAUTERY BLADE 6.4 (BLADE) ×8 IMPLANT
ELECT REM PT RETURN 9FT ADLT (ELECTROSURGICAL) ×4
ELECTRODE REM PT RTRN 9FT ADLT (ELECTROSURGICAL) ×2 IMPLANT
GAUZE SPONGE 4X4 12PLY STRL (GAUZE/BANDAGES/DRESSINGS) IMPLANT
GLOVE BIO SURGEON STRL SZ7 (GLOVE) ×8 IMPLANT
GLOVE BIOGEL PI IND STRL 6 (GLOVE) IMPLANT
GLOVE BIOGEL PI IND STRL 6.5 (GLOVE) IMPLANT
GLOVE BIOGEL PI IND STRL 7.5 (GLOVE) ×4 IMPLANT
GLOVE BIOGEL PI INDICATOR 6 (GLOVE) ×2
GLOVE BIOGEL PI INDICATOR 6.5 (GLOVE) ×4
GLOVE BIOGEL PI INDICATOR 7.5 (GLOVE) ×4
GLOVE SURG SIGNA 7.5 PF LTX (GLOVE) ×4 IMPLANT
GOWN STRL REUS W/ TWL LRG LVL3 (GOWN DISPOSABLE) ×12 IMPLANT
GOWN STRL REUS W/ TWL XL LVL3 (GOWN DISPOSABLE) IMPLANT
GOWN STRL REUS W/TWL LRG LVL3 (GOWN DISPOSABLE) ×28
GOWN STRL REUS W/TWL XL LVL3 (GOWN DISPOSABLE) ×16
KIT BASIN OR (CUSTOM PROCEDURE TRAY) ×4 IMPLANT
KIT ROOM TURNOVER OR (KITS) ×4 IMPLANT
LIGASURE IMPACT 36 18CM CVD LR (INSTRUMENTS) ×4 IMPLANT
NDL HYPO 25GX1X1/2 BEV (NEEDLE) ×2 IMPLANT
NEEDLE HYPO 25GX1X1/2 BEV (NEEDLE) ×4 IMPLANT
NS IRRIG 1000ML POUR BTL (IV SOLUTION) ×8 IMPLANT
PACK GENERAL/GYN (CUSTOM PROCEDURE TRAY) ×4 IMPLANT
PAD ARMBOARD 7.5X6 YLW CONV (MISCELLANEOUS) ×8 IMPLANT
PENCIL BUTTON HOLSTER BLD 10FT (ELECTRODE) ×4 IMPLANT
RELOAD PROXIMATE 75MM BLUE (ENDOMECHANICALS) ×8 IMPLANT
RELOAD STAPLE 75 3.8 BLU REG (ENDOMECHANICALS) IMPLANT
SPONGE LAP 18X18 X RAY DECT (DISPOSABLE) ×2 IMPLANT
STAPLER GUN LINEAR PROX 60 (STAPLE) ×2 IMPLANT
STAPLER PROXIMATE 75MM BLUE (STAPLE) ×2 IMPLANT
STAPLER VISISTAT 35W (STAPLE) ×4 IMPLANT
STRIP CLOSURE SKIN 1/2X4 (GAUZE/BANDAGES/DRESSINGS) ×3 IMPLANT
SUCTION POOLE TIP (SUCTIONS) ×4 IMPLANT
SUT MNCRL AB 4-0 PS2 18 (SUTURE) ×4 IMPLANT
SUT NOVA NAB DX-16 0-1 5-0 T12 (SUTURE) ×6 IMPLANT
SUT NOVA NAB GS-21 0 18 T12 DT (SUTURE) IMPLANT
SUT NOVA NAB GS-21 1 T12 (SUTURE) IMPLANT
SUT NOVAFIL 0 T 12 (SUTURE) ×2 IMPLANT
SUT PDS AB 1 TP1 96 (SUTURE) ×8 IMPLANT
SUT PROLENE 2 0 CT2 30 (SUTURE) IMPLANT
SUT PROLENE 2 0 KS (SUTURE) IMPLANT
SUT SILK 2 0 SH CR/8 (SUTURE) ×4 IMPLANT
SUT SILK 2 0 TIES 10X30 (SUTURE) ×4 IMPLANT
SUT SILK 3 0 SH CR/8 (SUTURE) ×4 IMPLANT
SUT SILK 3 0 TIES 10X30 (SUTURE) ×4 IMPLANT
SUT VIC AB 3-0 SH 18 (SUTURE) IMPLANT
SUT VIC AB 3-0 SH 27 (SUTURE) ×4
SUT VIC AB 3-0 SH 27XBRD (SUTURE) ×2 IMPLANT
SYR BULB IRRIGATION 50ML (SYRINGE) ×4 IMPLANT
SYR CONTROL 10ML LL (SYRINGE) ×4 IMPLANT
TOWEL OR 17X24 6PK STRL BLUE (TOWEL DISPOSABLE) ×4 IMPLANT
TOWEL OR 17X26 10 PK STRL BLUE (TOWEL DISPOSABLE) ×8 IMPLANT
TRAY FOLEY W/METER SILVER 16FR (SET/KITS/TRAYS/PACK) ×2 IMPLANT
TUBE CONNECTING 12'X1/4 (SUCTIONS) ×1
TUBE CONNECTING 12X1/4 (SUCTIONS) ×3 IMPLANT
TUBING BULK SUCTION (MISCELLANEOUS) ×2 IMPLANT
YANKAUER SUCT BULB TIP NO VENT (SUCTIONS) ×4 IMPLANT

## 2016-10-14 NOTE — H&P (View-Only) (Signed)
History of Present Illness Ashley Peters. Rand Etchison MD; 10/07/2016 4:52 PM) The patient is a 83 year old female who presents with a colonic mass. Referred by Dr. Vonna Kotyk Dettinger for right colon mass - probable cancer  This is an 83 year old female who presents for evaluation of right colon mass. This patient is independent and lives alone. She arrives her own vehicle. She is fairly active. Over the last year she has lost about 30 pounds but she states that she was trying to lose some weight by changing her diet. Over the last several months she has noticed a decreased level of energy. In March she had one episode of epigastric abdominal pain that this resolved. Recently, she has noticed some lower abdominal pain in the right lower quadrant and has palpated a firm mass in this area. She denies any fever or chills. She was found to be anemic at 8.7. White count was normal at 9.2. Platelet count 544. CMP was normal except for a potassium of 5.8 albumin 3.1. Subsequently she underwent CT scan of the abdomen and pelvis. This showed a 9.5 cm intraluminal mass in the cecum and ascending colon with tumor extension into the adjacent pericolonic fat. There is some right pericolonic and mesenteric lymphadenopathy. No other signs of metastases identified. She has a small epigastric ventral hernia. She denies any urinary symptoms or changes in bowel habits.  She is accompanied by her daughter her son and her son-in-law. Her last colonoscopy was in 2003.  CLINICAL DATA: Right lower quadrant pain, weight loss, and fatigue over past 3 weeks. Right lower quadrant palpable mass.  EXAM: CT ABDOMEN AND PELVIS WITH CONTRAST  TECHNIQUE: Multidetector CT imaging of the abdomen and pelvis was performed using the standard protocol following bolus administration of intravenous contrast.  CONTRAST: 120mL ISOVUE-300 IOPAMIDOL (ISOVUE-300) INJECTION 61%  COMPARISON: None.  FINDINGS: Lower Chest: No acute  findings.  Hepatobiliary: No masses identified. Prior cholecystectomy. Mild biliary ductal dilatation, likely due to prior cholecystectomy.  Pancreas: No mass or inflammatory changes.  Spleen: Within normal limits in size and appearance.  Adrenals/Urinary Tract: No masses identified. Bilateral renal cysts noted, largest arising from lower pole of left kidney and measuring 5.5 cm. No evidence of hydronephrosis.  Stomach/Bowel: Tiny hiatal hernia. Colonic diverticulosis, without radiographic evidence of diverticulitis.  A large intraluminal colonic mass is seen in the cecum and ascending colon which measures 9.5 x 7.1 cm, consistent with colon carcinoma. Extension into adjacent pericolonic fat is seen along the lateral wall on image 53/2. No evidence of bowel obstruction.  Vascular/Lymphatic: Multiple small sub-cm pericolonic lymph nodes are seen along the medial wall of the ascending colon and in the right abdominal mesentery, suspicious for lymph node metastases. Aortic atherosclerosis.  Reproductive: Prior hysterectomy noted. Adnexal regions are unremarkable in appearance.  Other: Small midline epigastric ventral hernia containing only fat. No evidence of herniated bowel loops.  Musculoskeletal: No suspicious bone lesions identified.  IMPRESSION: Large 9.5 cm intraluminal mass in the cecum and ascending colon, consistent with colon carcinoma. Tumor extension into adjacent pericolonic fat seen along the lateral wall.  Mild right pericolonic and mesenteric lymphadenopathy, suspicious for metastatic disease. No other sites of metastatic disease identified.  Colonic diverticulosis, without radiographic evidence of diverticulitis. Tiny hiatal hernia, and small epigastric ventral hernia containing only fat.  Aortic atherosclerosis.  These results will be called to the ordering clinician or representative by the Radiologist Assistant, and communication documented in the  PACS or zVision Dashboard.   Electronically Signed By:  Earle Gell M.D. On: 09/19/2016 13:40    Past Surgical History Dalbert Mayotte, Ashley; 10/07/2016 2:36 PM) Breast Biopsy Bilateral. multiple Cataract Surgery Bilateral. Gallbladder Surgery - Open Hysterectomy (not due to cancer) - Partial  Diagnostic Studies History Dalbert Mayotte, [place]; 10/07/2016 2:36 PM) Colonoscopy >10 years ago Mammogram within last year Pap Smear >5 years ago  Allergies Dalbert Mayotte, [place]; 10/07/2016 2:36 PM) No Known Drug Allergies 10/07/2016 Allergies Reconciled  Medication History Dalbert Mayotte, CMA; 10/07/2016 2:37 PM) Ferrous Sulfate (325 (65 Fe)MG Tablet, Oral) Active. Vitamin D (Oral) Specific strength unknown - Active. Tylenol (1 (one) Oral) Specific strength unknown - Active. Medications Reconciled  Social History Dalbert Mayotte, [place]; 10/07/2016 2:36 PM) Caffeine use Carbonated beverages. No alcohol use No drug use Tobacco use Never smoker.  Family History Dalbert Mayotte, [place]; 10/07/2016 2:36 PM) Breast Cancer Sister. Diabetes Mellitus Brother. Kidney Disease Brother. Seizure disorder Son.  Pregnancy / Birth History Dalbert Mayotte, [place]; 10/07/2016 2:36 PM) Gravida 4 Maternal age 60-30 Para 3  Other Problems Dalbert Mayotte, [place]; 10/07/2016 2:36 PM) No pertinent past medical history     Review of Systems Dalbert Mayotte CMA; 10/07/2016 2:36 PM) General Present- Fatigue. Not Present- Appetite Loss, Chills, Fever, Night Sweats, Weight Gain and Weight Loss. Skin Not Present- Change in Wart/Mole, Dryness, Hives, Jaundice, New Lesions, Non-Healing Wounds, Rash and Ulcer. HEENT Not Present- Earache, Hearing Loss, Hoarseness, Nose Bleed, Oral Ulcers, Ringing in the Ears, Seasonal Allergies, Sinus Pain, Sore Throat, Visual Disturbances, Wears glasses/contact lenses and Yellow Eyes. Respiratory Not Present- Bloody sputum, Chronic Cough, Difficulty Breathing,  Snoring and Wheezing. Breast Not Present- Breast Mass, Breast Pain, Nipple Discharge and Skin Changes. Cardiovascular Not Present- Chest Pain, Difficulty Breathing Lying Down, Leg Cramps, Palpitations, Rapid Heart Rate, Shortness of Breath and Swelling of Extremities. Gastrointestinal Present- Abdominal Pain. Not Present- Bloating, Bloody Stool, Change in Bowel Habits, Chronic diarrhea, Constipation, Difficulty Swallowing, Excessive gas, Gets full quickly at meals, Hemorrhoids, Indigestion, Nausea, Rectal Pain and Vomiting. Female Genitourinary Not Present- Frequency, Nocturia, Painful Urination, Pelvic Pain and Urgency. Musculoskeletal Not Present- Back Pain, Joint Pain, Joint Stiffness, Muscle Pain, Muscle Weakness and Swelling of Extremities. Neurological Not Present- Decreased Memory, Fainting, Headaches, Numbness, Seizures, Tingling, Tremor, Trouble walking and Weakness. Psychiatric Not Present- Anxiety, Bipolar, Change in Sleep Pattern, Depression, Fearful and Frequent crying. Endocrine Not Present- Cold Intolerance, Excessive Hunger, Hair Changes, Heat Intolerance, Hot flashes and New Diabetes. Hematology Not Present- Blood Thinners, Easy Bruising, Excessive bleeding, Gland problems, HIV and Persistent Infections.  Vitals Dalbert Mayotte CMA; 10/07/2016 2:38 PM) 10/07/2016 2:38 PM Weight: 107.2 lb Height: 60in Body Surface Area: 1.43 m Body Mass Index: 20.94 kg/m  Temp.: 98.34F  Pulse: 105 (Regular)  BP: 120/64 (Sitting, Left Arm, Standard)      Physical Exam Rodman Key K. Parvin Stetzer MD; 10/07/2016 4:53 PM)  The physical exam findings are as follows: Note:Elderly female in NAD Eyes: Pupils equal, round; sclera anicteric HENT: Oral mucosa moist; good dentition Neck: No masses palpated, no thyromegaly Lungs: CTA bilaterally; normal respiratory effort CV: Regular rate and rhythm; no murmurs; extremities well-perfused with no edema Abd: +bowel sounds, soft, mildly tender in  RLQ; large firm palpable mass in RLQ - slightly mobile, does not appear to be fixed to surrounding abdominal wall Skin: Warm, dry; no sign of jaundice Psychiatric - alert and oriented x 4; calm mood and affect    Assessment & Plan Rodman Key K. Torence Palmeri MD; 10/07/2016 4:55 PM)  CANCER OF RIGHT COLON (C18.2)  Current Plans Schedule for  Surgery - Open right hemicolectomy/ repair of ventral hernia. The surgical procedure has been discussed with the patient. Potential risks, benefits, alternative treatments, and expected outcomes have been explained. All of the patient's questions at this time have been answered. The likelihood of reaching the patient's treatment goal is good. The patient understand the proposed surgical procedure and wishes to proceed.  I spent some time with the patient and her family discussed in the upcoming procedure. There is no indication for colonoscopy as this would not change our surgical management. There is risk of developing obstruction if the tumor continues to grow unabated. The patient has a fairly good functional status and should be able to tolerate surgery. We will try to minimize her narcotic use by using adjunct pain management. We discussed benefits and risks of the procedure. She may require transfusion after surgery and she is already anemic. We'll perform a 1 day MiraLAX bowel prep.  Ashley Peters. Georgette Dover, MD, [facility]  General/ Trauma Surgery  10/07/2016 4:55 PM

## 2016-10-14 NOTE — Anesthesia Procedure Notes (Signed)
Procedure Name: Intubation Date/Time: 10/14/2016 2:36 PM Performed by: Julieta Bellini Pre-anesthesia Checklist: Patient identified, Emergency Drugs available, Suction available and Patient being monitored Patient Re-evaluated:Patient Re-evaluated prior to induction Oxygen Delivery Method: Circle system utilized Preoxygenation: Pre-oxygenation with 100% oxygen Induction Type: IV induction Ventilation: Oral airway inserted - appropriate to patient size and Mask ventilation without difficulty Laryngoscope Size: Mac and 3 Grade View: Grade I Tube type: Oral Tube size: 7.0 mm Number of attempts: 1 Airway Equipment and Method: Stylet Placement Confirmation: ETT inserted through vocal cords under direct vision,  positive ETCO2 and breath sounds checked- equal and bilateral Secured at: 21 cm Tube secured with: Tape Dental Injury: Teeth and Oropharynx as per pre-operative assessment

## 2016-10-14 NOTE — Transfer of Care (Signed)
Immediate Anesthesia Transfer of Care Note  Patient: Ashley Peters  Procedure(s) Performed: Procedure(s): OPEN RIGHT HEMICOLECTOMY (Right) REPAIR OF EPIGASTRIC VENTRAL HERNIA (N/A)  Patient Location: PACU  Anesthesia Type:General  Level of Consciousness: drowsy and patient cooperative  Airway & Oxygen Therapy: Patient Spontanous Breathing and Patient connected to nasal cannula oxygen  Post-op Assessment: Report given to RN, Post -op Vital signs reviewed and stable and Patient moving all extremities X 4  Post vital signs: Reviewed and stable  Last Vitals:  Vitals:   10/14/16 1204 10/14/16 1630  BP: 128/60   Pulse: 94   Resp: 18   Temp: 36.9 C (P) 36.8 C    Last Pain:  Vitals:   10/14/16 1630  TempSrc:   PainSc: (P) 0-No pain      Patients Stated Pain Goal: 3 (80/03/49 1791)  Complications: No apparent anesthesia complications

## 2016-10-14 NOTE — Anesthesia Preprocedure Evaluation (Addendum)
Anesthesia Evaluation  Patient identified by MRN, date of birth, ID band Patient awake    Reviewed: Allergy & Precautions, NPO status , Patient's Chart, lab work & pertinent test results  History of Anesthesia Complications Negative for: history of anesthetic complications  Airway Mallampati: I  TM Distance: >3 FB Neck ROM: Full    Dental  (+) Edentulous Upper, Edentulous Lower, Dental Advisory Given   Pulmonary neg pulmonary ROS,    Pulmonary exam normal        Cardiovascular hypertension, negative cardio ROS Normal cardiovascular exam     Neuro/Psych negative neurological ROS  negative psych ROS   GI/Hepatic Neg liver ROS, GERD  ,  Endo/Other  negative endocrine ROS  Renal/GU negative Renal ROS  negative genitourinary   Musculoskeletal negative musculoskeletal ROS (+)   Abdominal   Peds negative pediatric ROS (+)  Hematology  (+) anemia ,   Anesthesia Other Findings   Reproductive/Obstetrics negative OB ROS                            Anesthesia Physical Anesthesia Plan  ASA: III  Anesthesia Plan: General   Post-op Pain Management:    Induction: Intravenous  PONV Risk Score and Plan: 3 and Ondansetron, Dexamethasone and Scopolamine patch - Pre-op  Airway Management Planned: Oral ETT  Additional Equipment:   Intra-op Plan:   Post-operative Plan: Extubation in OR  Informed Consent: I have reviewed the patients History and Physical, chart, labs and discussed the procedure including the risks, benefits and alternatives for the proposed anesthesia with the patient or authorized representative who has indicated his/her understanding and acceptance.   Dental advisory given  Plan Discussed with: Anesthesiologist, CRNA and Surgeon  Anesthesia Plan Comments:        Anesthesia Quick Evaluation

## 2016-10-14 NOTE — Interval H&P Note (Signed)
History and Physical Interval Note:  10/14/2016 12:45 PM  Ashley Peters  has presented today for surgery, with the diagnosis of Right colon cancer; epigastric ventral hernia  The various methods of treatment have been discussed with the patient and family. After consideration of risks, benefits and other options for treatment, the patient has consented to  Procedure(s): OPEN RIGHT HEMICOLECTOMY (Right) REPAIR OF EPIGASTRIC VENTRAL HERNIA (N/A) as a surgical intervention .  The patient's history has been reviewed, patient examined, no change in status, stable for surgery.  I have reviewed the patient's chart and labs.  Questions were answered to the patient's satisfaction.     Jahkeem Kurka K.

## 2016-10-14 NOTE — Op Note (Signed)
Preop diagnosis: #1 right colon cancer  #2 epigastric ventral incisional hernia Postop diagnosis: Same Procedure performed: #1 open right hemicolectomy Primary repair of ventral incisional hernia Surgeon:Kamal Jurgens K. Anesthesia: Gen. endotracheal Indications: This is an 83 year old female who is fairly active and lives independently who recently presented with decreased level of energy and right lower quadrant abdominal pain. She was found to have a hard mass in this area. She was anemic with a hemoglobin of 8.7. White count was normal. CT scan showed a 9.5 cm mass in the cecum.  She also has some surrounding lymphadenopathy. The patient has a small ventral incisional hernia in her epigastrium from a previous open cholecystectomy. We will repair this at the time of surgery. She presents now for surgical resection of her colon cancer. Preoperative CEA level was elevated at 27.  Description of procedure: The patient is brought to the operating room placed in supine position on the operating room table. After an adequate level of general anesthesia was obtained, the nursing staff placed a Foley catheter under sterile technique. The patient's abdomen was prepped with ChloraPrep and draped sterile fashion. A timeout was taken to ensure the proper patient and proper procedure. We made a midline incision from the epigastrium over the hernia down to the mid lower abdomen. We dissected down through the subcutaneous tissues to the linea alba. We open the linea alba and into the peritoneal cavity sharply. No purulence is noted. There is minimal free fluid in the abdomen. The large mass in the right lower quadrant is easily palpated and is adherent to the anterolateral abdominal wall in the right lower quadrant. She has some omental adhesions superiorly at the edge of her liver. The left side of the abdomen shows no gross abnormalities. Sigmoid colon is palpated and is normal. Stomach is also palpated as normal.  The left lobe of the liver shows no sign of metastatic disease.  We took some time taking down the adhesions in the right upper quadrant to were able to visualize liver. No sign of metastatic disease in the right lobe of the liver. We mobilized the transverse colon out to the hepatic flexure. We worked her way down the right paracolic gutter mobilizing the colon medially. We dissected the large cecal mass away from the anterior abdominal wall. We did have to take a small portion of the abdominal wall along with the mass. We then identified the terminal ileum where it entered into the cecum. This area seem to be involved with a large mass. We divided the terminal ileum about 10 cm proximal to the ileocecal valve with a GIA-75 stapler. We dissected the mesentery using the LigaSure device. Were able to mobilize the cecum up into the wound. We continued our dissection along the right paracolic gutter moving inferiorly. We identified the duodenum which appeared to be soft and free of any injury or involvement with the tumor. Once we had fully mobilized the right colon and cecum we divided the transverse colon just proximal to the middle colic artery with a JTT-01. The mesentery was divided with the LigaSure device. There are palpable lymph nodes within the mesentery. The specimen was removed and sent for pathologic examination. We irrigated thoroughly and inspected for hemostasis. We created a side-to-side ileocolic anastomosis using a GIA 75 stapler and a TA 60 stapler. The crotch of the anastomosis was then reinforced with 3-0 silk. The mesenteric defect was closed with 2-0 silk. The anastomosis was palpated and was widely patent. There  is no sign of leak. The anastomosis didn't start seem to be under any tension. We again irrigated. The duodenum was again evaluated was normal. There is no purulence or bleeding in the pelvis or the right lower quadrant. We then changed our gowns and gloves in accordance with the  colon protocol. We reapproximated the edges of the fascial defect in the right lower quadrant where we had to remove the portion of the abdominal wall along with tumor. We did this with 0 Novafil sutures. The midline fascia was reapproximated with multiple figure-of-eight #1 Novafil sutures. Once the fascia was completely closed we irrigated the wound thoroughly. We infiltrated with 20 mL's of Exparel.  Staples were used to close the skin. Clean dressing was applied. The patient was then extubated and brought to recovery room in stable condition. All sponge, instrument, and needle counts are correct.  A photograph of the specimen is attached below  Imogene Burn. Georgette Dover, MD, [facility] Surgery  General/ Trauma Surgery  10/14/2016 4:54 PM

## 2016-10-15 ENCOUNTER — Encounter (HOSPITAL_COMMUNITY): Payer: Self-pay | Admitting: Surgery

## 2016-10-15 LAB — BASIC METABOLIC PANEL
ANION GAP: 10 (ref 5–15)
BUN: 9 mg/dL (ref 6–20)
CALCIUM: 8.7 mg/dL — AB (ref 8.9–10.3)
CHLORIDE: 106 mmol/L (ref 101–111)
CO2: 23 mmol/L (ref 22–32)
Creatinine, Ser: 0.75 mg/dL (ref 0.44–1.00)
GFR calc non Af Amer: >60 mL/min (ref >60)
Glucose, Bld: 136 mg/dL — ABNORMAL HIGH (ref 65–99)
Potassium: 3.7 mmol/L (ref 3.5–5.1)
SODIUM: 139 mmol/L (ref 135–145)

## 2016-10-15 LAB — CBC
HEMATOCRIT: 32.1 % — AB (ref 36.0–46.0)
HEMOGLOBIN: 9.8 g/dL — AB (ref 12.0–15.0)
MCH: 22.4 pg — ABNORMAL LOW (ref 26.0–34.0)
MCHC: 30.5 g/dL (ref 30.0–36.0)
MCV: 73.3 fL — ABNORMAL LOW (ref 78.0–100.0)
Platelets: 376 10*3/uL (ref 150–400)
RBC: 4.38 MIL/uL (ref 3.87–5.11)
RDW: 20.6 % — AB (ref 11.5–15.5)
WBC: 7.4 10*3/uL (ref 4.0–10.5)

## 2016-10-15 NOTE — Progress Notes (Signed)
1 Day Post-Op   Subjective/Chief Complaint: Resting comfortably.  Got some sleep last night No significant discomfort Has not used any narcotic post-op - Tylenol, Neurontin, Toradol   Objective: Vital signs in last 24 hours: Temp:  [97.5 F (36.4 C)-98.4 F (36.9 C)] 97.5 F (36.4 C) (08/08 0532) Pulse Rate:  [61-94] 79 (08/08 0532) Resp:  [15-18] 16 (08/08 0532) BP: (113-131)/(46-62) 121/46 (08/08 0532) SpO2:  [95 %-99 %] 95 % (08/08 0532) Weight:  [47.7 kg (105 lb 3.2 oz)] 47.7 kg (105 lb 3.2 oz) (08/07 1204) Last BM Date: 10/14/16  Intake/Output from previous day: 08/07 0701 - 08/08 0700 In: 2794 [I.V.:2150; Blood:644] Out: 440 [Urine:360; Blood:80] Intake/Output this shift: No intake/output data recorded.  General appearance: alert, cooperative and no distress Resp: clear to auscultation bilaterally Cardio: regular rate and rhythm, S1, S2 normal, no murmur, click, rub or gallop GI: soft, hypoactive bowel sounds, incisional tenderness Staple line clean and intact through honeycomb dressing  Lab Results:   Recent Labs  10/13/16 0841 10/15/16 0522  WBC 7.9 7.4  HGB 8.4* 9.8*  HCT 28.6* 32.1*  PLT 491* 376   BMET  Recent Labs  10/13/16 0841 10/15/16 0522  NA 138 139  K 3.7 3.7  CL 105 106  CO2 25 23  GLUCOSE 94 136*  BUN 13 9  CREATININE 0.56 0.75  CALCIUM 9.2 8.7*   PT/INR No results for input(s): LABPROT, INR in the last 72 hours. ABG No results for input(s): PHART, HCO3 in the last 72 hours.  Invalid input(s): PCO2, PO2  Studies/Results: No results found.  Anti-infectives: Anti-infectives    Start     Dose/Rate Route Frequency Ordered Stop   10/15/16 0600  cefoTEtan in Dextrose 5% (CEFOTAN) IVPB 2 g     2 g Intravenous On call to O.R. 10/14/16 1207 10/15/16 0600   10/15/16 0030  cefoTEtan (CEFOTAN) 2 g in dextrose 5 % 50 mL IVPB     2 g 100 mL/hr over 30 Minutes Intravenous Every 12 hours 10/14/16 1948 10/15/16 0100   10/14/16 1218   cefoTEtan in Dextrose 5% (CEFOTAN) 2-2.08 GM-% IVPB  Status:  Discontinued    Comments:  Forte, Lindsi   : cabinet override      10/14/16 1218 10/14/16 1240      Assessment/Plan: s/p Procedure(s): OPEN RIGHT HEMICOLECTOMY (Right) REPAIR OF EPIGASTRIC VENTRAL HERNIA (N/A) VTE - Lovenox Clear liquids until bowel function returns Recheck labs in AM - Hgb improved from pre-operative anemia after 2 u PRBC Physical therapy/ OOB to chair/ encourage ambulation Incentive spirometer Abdominal binder   LOS: 1 day    Ashley Seeley K. 10/15/2016

## 2016-10-15 NOTE — Anesthesia Postprocedure Evaluation (Signed)
Anesthesia Post Note  Patient: Ashley Peters  Procedure(s) Performed: Procedure(s) (LRB): OPEN RIGHT HEMICOLECTOMY (Right) REPAIR OF EPIGASTRIC VENTRAL HERNIA (N/A)     Patient location during evaluation: PACU Anesthesia Type: General Level of consciousness: sedated Pain management: pain level controlled Vital Signs Assessment: post-procedure vital signs reviewed and stable Respiratory status: spontaneous breathing and respiratory function stable Cardiovascular status: stable Anesthetic complications: no                 Tressa Maldonado DANIEL

## 2016-10-15 NOTE — Consult Note (Signed)
[facility] CM Primary Care Navigator  10/15/2016  Ashley Peters 12/26/1928 [id number]   Met with patient and son (Jack) at the bedside to identify possibledischarge needs.  Patient reports had been having right lower quadrant pain and found to have a "knot" to this area that had led to this admission/ surgery. Patient endorses Dr. [facility]er with [facility] as herprimary [facility] provider.   [facility] [place] [facility] well as [facility] serviceto obtain medications without difficulty.   Shemanagesher ownmedications at home straight out of the containers with plan to use "pill box" system" after discharge.   Patient reports being able to drive (short distances) prior to admission/ surgery. Her children will be providing transportation to herdoctors' appointments after discharge.  Patient is independent and was living alone. Her daughter (Serita) will be the primary caregiver at home as stated.   Anticipated discharge plan ishome according to patient. Still awaiting PT/ OT evaluation at this time. Patient reports that she will be staying at her daughter's house while she recovers.  Patientand son voiced understanding to call primary care provider's office once discharged, for a post discharge follow-up appointment within a week or sooner if needs arise.Patient letter (with PCP's contact number) wasprovided as areminder.  Explained to patient and son regarding[facility] CM services available for healthmanagement but shedenies any concerns or needs at this time. Son states that she has a "good family support" who will be looking after her and assisting with her needs after discharge.  [facility] care management information provided for future needs that she may have.  For questions, please contact:  Lorraine Ajel, BSN, RN- [facility] Primary Care Navigator  Telephone: [phone] [facility] 

## 2016-10-15 NOTE — Evaluation (Signed)
Physical Therapy Evaluation Patient Details Name: Ashley Peters MRN: [id number] DOB: Mar 01, 1929 Today's Date: 10/15/2016   History of Present Illness  83 yo female with onset of colon CA received partial colectomy on 8/7, now referred to PT for mobility assessment. Has binder for support of sx, and ventral hernia.  PMHx:  30# weight loss, colon mass,   Clinical Impression  Pt is getting up to walk with PT to BR and then in her room with unsteady balance at times.  Her use of IV pole was helpful but did not completely control need for physical assist.  Will see again to determine if AD needed or just supervision for her balance changes, and HHPT requested to follow her.  Plan acute program to address her balance and gait as well as potential need for AD.    Follow Up Recommendations Home health PT;Supervision for mobility/OOB    Equipment Recommendations  Other (comment) (need to assess as she moves around more )    Recommendations for Other Services       Precautions / Restrictions Precautions Precautions: Fall (telemetry) Restrictions Weight Bearing Restrictions: No Other Position/Activity Restrictions: binder to support sx and hernia      Mobility  Bed Mobility               General bed mobility comments: up in chair when PT arrived  Transfers Overall transfer level: Needs assistance Equipment used: 1 person hand held assist (IV pole) Transfers: Sit to/from Stand Sit to Stand: Min guard;Min assist         General transfer comment: pt became unsteady and listing to L with initial standing, controlled with PT holding initially and then held onto IV pole  Ambulation/Gait Ambulation/Gait assistance: Min guard;Min assist Ambulation Distance (Feet): 70 Feet (35 x 2) Assistive device: 1 person hand held assist (IV pole) Gait Pattern/deviations: Step-through pattern;Step-to pattern;Shuffle;Decreased stride length;Wide base of support;Narrow base of support;Trunk  flexed Gait velocity: reduced Gait velocity interpretation: Below normal speed for age/gender General Gait Details: pt is having difficulty controlling standing balance without a hold on her and requires cues for hand placement esp to transition to sit  Stairs            Wheelchair Mobility    Modified Rankin (Stroke Patients Only)       Balance Overall balance assessment: Needs assistance Sitting-balance support: Feet supported Sitting balance-Leahy Scale: Fair     Standing balance support: Bilateral upper extremity supported;During functional activity Standing balance-Leahy Scale: Poor                               Pertinent Vitals/Pain Pain Assessment: Faces Faces Pain Scale: No hurt    Home Living Family/patient expects to be discharged to:: Private residence Living Arrangements: Alone Available Help at Discharge: Family;Available 24 hours/day Type of Home: House Home Access: Stairs to enter Entrance Stairs-Rails: None Entrance Stairs-Number of Steps: 2 Home Layout: One level Home Equipment: None Additional Comments: was driving and independent in the community prior to hosp    Prior Function Level of Independence: Independent               Hand Dominance        Extremity/Trunk Assessment   Upper Extremity Assessment Upper Extremity Assessment: Overall WFL for tasks assessed    Lower Extremity Assessment Lower Extremity Assessment: Overall WFL for tasks assessed    Cervical / Trunk Assessment Cervical / Trunk  Assessment: Normal  Communication   Communication: No difficulties  Cognition Arousal/Alertness: Awake/alert Behavior During Therapy: WFL for tasks assessed/performed Overall Cognitive Status: Within Functional Limits for tasks assessed                                 General Comments: was unsteady initially but did have improvement with practice      General Comments      Exercises      Assessment/Plan    PT Assessment Patient needs continued PT services  PT Problem List Decreased range of motion;Decreased activity tolerance;Decreased balance;Decreased mobility;Decreased coordination;Decreased safety awareness;Decreased skin integrity;Decreased knowledge of precautions       PT Treatment Interventions DME instruction;Gait training;Stair training;Functional mobility training;Therapeutic activities;Therapeutic exercise;Balance training;Neuromuscular re-education;Patient/family education    PT Goals (Current goals can be found in the Care Plan section)  Acute Rehab PT Goals Patient Stated Goal: to walk more and get home with daughter PT Goal Formulation: With patient/family Time For Goal Achievement: 10/29/16 Potential to Achieve Goals: Good    Frequency Min 3X/week   Barriers to discharge Other (comment) (has been home alone but has daughter who can take her home) daughter works from home    Co-evaluation               AM-PAC PT "6 Clicks" Daily Activity  Outcome Measure Difficulty turning over in bed (including adjusting bedclothes, sheets and blankets)?: Total Difficulty moving from lying on back to sitting on the side of the bed? : Total Difficulty sitting down on and standing up from a chair with arms (e.g., wheelchair, bedside commode, etc,.)?: Total Help needed moving to and from a bed to chair (including a wheelchair)?: A Little Help needed walking in hospital room?: A Little Help needed climbing 3-5 steps with a railing? : A Lot 6 Click Score: 11    End of Session Equipment Utilized During Treatment: Gait belt (used at axillae) Activity Tolerance: Patient tolerated treatment well;Patient limited by fatigue Patient left: in chair;with call bell/phone within reach;with family/visitor present;with chair alarm set Nurse Communication: Mobility status PT Visit Diagnosis: Unsteadiness on feet (R26.81);Difficulty in walking, not elsewhere classified  (R26.2)    Time: 2263-3354 PT Time Calculation (min) (ACUTE ONLY): 29 min   Charges:   PT Evaluation $PT Eval Low Complexity: 1 Low PT Treatments $Gait Training: 8-22 mins   PT G Codes:   PT G-Codes **NOT FOR INPATIENT CLASS** Functional Assessment Tool Used: AM-PAC 6 Clicks Basic Mobility    Ramond Dial 10/15/2016, 6:06 PM   Mee Hives, PT MS Acute Rehab Dept. Number: [place] and [place]

## 2016-10-15 NOTE — Care Management Note (Signed)
Case Management Note  Patient Details  Name: Ashley Peters MRN: [id number] Date of Birth: 1928/10/21  Subjective/Objective:                    Action/Plan:  OPEN RIGHT HEMICOLECTOMY (Right) REPAIR OF EPIGASTRIC VENTRAL HERNIA   Await PT/OT evals Expected Discharge Date:                  Expected Discharge Plan:  Home/Self Care  In-House Referral:     Discharge planning Services  CM Consult  Post Acute Care Choice:    Choice offered to:     DME Arranged:    DME Agency:     HH Arranged:    HH Agency:     Status of Service:  In process, will continue to follow  If discussed at Long Length of Stay Meetings, dates discussed:    Additional Comments:  Marilu Favre, RN 10/15/2016, 10:16 AM

## 2016-10-16 LAB — BASIC METABOLIC PANEL
ANION GAP: 7 (ref 5–15)
BUN: 13 mg/dL (ref 6–20)
CALCIUM: 8.4 mg/dL — AB (ref 8.9–10.3)
CHLORIDE: 103 mmol/L (ref 101–111)
CO2: 26 mmol/L (ref 22–32)
Creatinine, Ser: 1 mg/dL (ref 0.44–1.00)
GFR calc Af Amer: 57 mL/min — ABNORMAL LOW (ref >60)
GFR calc non Af Amer: 50 mL/min — ABNORMAL LOW (ref >60)
Glucose, Bld: 81 mg/dL (ref 65–99)
Potassium: 3.7 mmol/L (ref 3.5–5.1)
Sodium: 136 mmol/L (ref 135–145)

## 2016-10-16 LAB — CBC
HCT: 30.5 % — ABNORMAL LOW (ref 36.0–46.0)
HEMOGLOBIN: 9.1 g/dL — AB (ref 12.0–15.0)
MCH: 22.1 pg — ABNORMAL LOW (ref 26.0–34.0)
MCHC: 29.8 g/dL — AB (ref 30.0–36.0)
MCV: 74.2 fL — ABNORMAL LOW (ref 78.0–100.0)
Platelets: 386 10*3/uL (ref 150–400)
RBC: 4.11 MIL/uL (ref 3.87–5.11)
RDW: 21.3 % — AB (ref 11.5–15.5)
WBC: 7.6 10*3/uL (ref 4.0–10.5)

## 2016-10-16 MED ORDER — PANTOPRAZOLE SODIUM 40 MG PO TBEC
40.0000 mg | DELAYED_RELEASE_TABLET | Freq: Every day | ORAL | Status: DC
  Administered 2016-10-16: 40 mg via ORAL
  Filled 2016-10-16: qty 1

## 2016-10-16 NOTE — Progress Notes (Signed)
Physical Therapy Treatment Patient Details Name: Ashley Peters MRN: [id number] DOB: September 28, 1928 Today's Date: 10/16/2016    History of Present Illness 83 yo female with onset of colon CA received partial colectomy on 8/7, now referred to PT for mobility assessment. Has binder for support of sx, and ventral hernia.  PMHx:  30# weight loss, colon mass,     PT Comments    Pt progressing towards goals. Increased steadiness from previous session, however, continues to demonstrate decreased balance and required min guard to min A for balance during gait without AD. Educated to use RW at home to increase safety with mobility; pt reporting she has RW at home already. Current recommendations remain appropriate. Will continue to follow acutely to maximize functional mobility independence and safety.    Follow Up Recommendations  Home health PT;Supervision for mobility/OOB     Equipment Recommendations  None recommended by PT (has RW at home )    Recommendations for Other Services       Precautions / Restrictions Precautions Precautions: Fall Restrictions Weight Bearing Restrictions: No Other Position/Activity Restrictions: binder to support sx and hernia    Mobility  Bed Mobility               General bed mobility comments: Pt OOB in chair upon arrival  Transfers Overall transfer level: Needs assistance Equipment used: None Transfers: Sit to/from Stand Sit to Stand: Min guard         General transfer comment: close min guard for safety, unsteadiness in standing but no major LOB  Ambulation/Gait Ambulation/Gait assistance: Min guard;Min assist Ambulation Distance (Feet): 125 Feet Assistive device: None Gait Pattern/deviations: Step-through pattern;Step-to pattern;Shuffle;Decreased stride length;Wide base of support;Narrow base of support;Trunk flexed;Drifts right/left Gait velocity: reduced Gait velocity interpretation: Below normal speed for age/gender General Gait  Details: Increased steadiness from previous session, however, continues to exhibit decreased balance during gait tasks. Slight LOB X2 during gait this session, and required min guard to min A for steadying. Educated to use RW at home to increase stability until balance is improved.    Stairs            Wheelchair Mobility    Modified Rankin (Stroke Patients Only)       Balance Overall balance assessment: Needs assistance Sitting-balance support: Feet supported;No upper extremity supported Sitting balance-Leahy Scale: Good     Standing balance support: No upper extremity supported;During functional activity Standing balance-Leahy Scale: Fair Standing balance comment: LOB X 2 during functional mobility requiring min guard to min A to correct.                             Cognition Arousal/Alertness: Awake/alert Behavior During Therapy: WFL for tasks assessed/performed Overall Cognitive Status: No family/caregiver present to determine baseline cognitive functioning                                 General Comments: Mild memory deficits, could not recall PT session yesterday.      Exercises General Exercises - Lower Extremity Long Arc Quad: AROM;Both;15 reps;Seated Hip Flexion/Marching: AROM;Both;15 reps;Seated Toe Raises: AROM;Both;15 reps;Seated Heel Raises: AROM;Both;15 reps;Seated    General Comments        Pertinent Vitals/Pain Pain Assessment: No/denies pain    Home Living Family/patient expects to be discharged to:: Private residence Living Arrangements: Alone Available Help at Discharge: Family;Available 24 hours/day Type of Home:  House Home Access: Stairs to enter Entrance Stairs-Rails: None Home Layout: One level Home Equipment: Environmental consultant - 2 wheels;Shower seat;Wheelchair - manual      Prior Function Level of Independence: Independent          PT Goals (current goals can now be found in the care plan section) Acute Rehab PT  Goals Patient Stated Goal: to walk more and get home with daughter PT Goal Formulation: With patient/family Time For Goal Achievement: 10/29/16 Potential to Achieve Goals: Good Progress towards PT goals: Progressing toward goals    Frequency    Min 3X/week      PT Plan Current plan remains appropriate    Co-evaluation              AM-PAC PT "6 Clicks" Daily Activity  Outcome Measure  Difficulty turning over in bed (including adjusting bedclothes, sheets and blankets)?: Total Difficulty moving from lying on back to sitting on the side of the bed? : Total Difficulty sitting down on and standing up from a chair with arms (e.g., wheelchair, bedside commode, etc,.)?: Total Help needed moving to and from a bed to chair (including a wheelchair)?: A Little Help needed walking in hospital room?: A Little Help needed climbing 3-5 steps with a railing? : A Lot 6 Click Score: 11    End of Session Equipment Utilized During Treatment: Gait belt Activity Tolerance: Patient tolerated treatment well Patient left: in chair;with call bell/phone within reach Nurse Communication: Mobility status PT Visit Diagnosis: Unsteadiness on feet (R26.81);Difficulty in walking, not elsewhere classified (R26.2)     Time: 8563-1497 PT Time Calculation (min) (ACUTE ONLY): 15 min  Charges:  $Gait Training: 8-22 mins                    G Codes:       Leighton Ruff, PT, DPT  Acute Rehabilitation Services  Pager: [phone]    Rudean Hitt 10/16/2016, 4:44 PM

## 2016-10-16 NOTE — Progress Notes (Signed)
2 Days Post-Op   Subjective/Chief Complaint: Patient has had several loose bowel movements Still has not used any narcotic Tolerating clear liquids Ambulating with physical therapy - may need HHPT   Objective: Vital signs in last 24 hours: Temp:  [97.5 F (36.4 C)-98.1 F (36.7 C)] 98.1 F (36.7 C) (08/09 0609) Pulse Rate:  [59-79] 59 (08/09 0609) Resp:  [16-17] 16 (08/09 0609) BP: (100-118)/(45-50) 118/50 (08/09 0609) SpO2:  [97 %-99 %] 97 % (08/09 0609) Last BM Date: 10/14/16  Intake/Output from previous day: 08/08 0701 - 08/09 0700 In: 1080 [P.O.:1080] Out: 700 [Urine:700] Intake/Output this shift: Total I/O In: 600 [P.O.:600] Out: -   WDWN in NAD Eyes:  Pupils equal, round; sclera anicteric Lungs:  CTA bilaterally; normal respiratory effort CV:  Regular rate and rhythm; no murmurs; extremities well-perfused with no edema Abd:  +bowel sounds, soft; minimal tenderness Incision c/d/i Skin:  Warm, dry; no sign of jaundice Psychiatric - alert and oriented x 4; calm mood and affect  Lab Results:   Recent Labs  10/15/16 0522 10/16/16 0400  WBC 7.4 7.6  HGB 9.8* 9.1*  HCT 32.1* 30.5*  PLT 376 386   BMET  Recent Labs  10/15/16 0522 10/16/16 0400  NA 139 136  K 3.7 3.7  CL 106 103  CO2 23 26  GLUCOSE 136* 81  BUN 9 13  CREATININE 0.75 1.00  CALCIUM 8.7* 8.4*   PT/INR No results for input(s): LABPROT, INR in the last 72 hours. ABG No results for input(s): PHART, HCO3 in the last 72 hours.  Invalid input(s): PCO2, PO2  Studies/Results: No results found.  Anti-infectives: Anti-infectives    Start     Dose/Rate Route Frequency Ordered Stop   10/15/16 0600  cefoTEtan in Dextrose 5% (CEFOTAN) IVPB 2 g     2 g Intravenous On call to O.R. 10/14/16 1207 10/15/16 0600   10/15/16 0030  cefoTEtan (CEFOTAN) 2 g in dextrose 5 % 50 mL IVPB     2 g 100 mL/hr over 30 Minutes Intravenous Every 12 hours 10/14/16 1948 10/15/16 0100   10/14/16 1218  cefoTEtan  in Dextrose 5% (CEFOTAN) 2-2.08 GM-% IVPB  Status:  Discontinued    Comments:  Forte, Lindsi   : cabinet override      10/14/16 1218 10/14/16 1240      Assessment/Plan: s/p Procedure(s): OPEN RIGHT HEMICOLECTOMY (Right) REPAIR OF EPIGASTRIC VENTRAL HERNIA (N/A) Pathology pending Saline lock Advance diet D/C Entereg Possible discharge in next day or two  LOS: 2 days    Rhoderick Farrel K. 10/16/2016

## 2016-10-16 NOTE — Evaluation (Signed)
Occupational Therapy Evaluation Patient Details Name: Ashley Peters MRN: [id number] DOB: [birth year] Today's Date: 10/16/2016    History of Present Illness 83 yo female with onset of colon CA received partial colectomy on 8/7, now referred to PT for mobility assessment. Has binder for support of sx, and ventral hernia.  PMHx:  30# weight loss, colon mass,    Clinical Impression   Pt reports she was independent with ADL PTA. Currently pt requires min guard to min assist for functional mobility due to LOB x3 during session. Pt requires min guard overall for ADL. Pt planning to d/c to her daughters house where she will have 24/7 supervision from family. Recommending HHOT for follow up to maximize independence and safety with ADL and functional mobility. Pt would benefit from continued skilled OT to address established goals.    Follow Up Recommendations  Home health OT;Supervision/Assistance - 24 hour    Equipment Recommendations  None recommended by OT    Recommendations for Other Services       Precautions / Restrictions Precautions Precautions: Fall Restrictions Weight Bearing Restrictions: No Other Position/Activity Restrictions: binder to support sx and hernia      Mobility Bed Mobility               General bed mobility comments: Pt OOB in chair upon arrival  Transfers Overall transfer level: Needs assistance Equipment used: None Transfers: Sit to/from Stand Sit to Stand: Min guard         General transfer comment: close min guard for safety, unsteadiness in standing but no major LOB    Balance Overall balance assessment: Needs assistance Sitting-balance support: Feet supported;No upper extremity supported Sitting balance-Leahy Scale: Good     Standing balance support: No upper extremity supported;During functional activity Standing balance-Leahy Scale: Fair Standing balance comment: LOB x3 with functional mobility; pt able to self correct with close min  guard-min assist.                           ADL either performed or assessed with clinical judgement   ADL Overall ADL's : Needs assistance/impaired Eating/Feeding: Set up;Sitting   Grooming: Min guard;Wash/dry hands;Standing   Upper Body Bathing: Set up;Supervision/ safety;Sitting   Lower Body Bathing: Set up;Min guard;Sit to/from stand   Upper Body Dressing : Set up;Supervision/safety;Sitting   Lower Body Dressing: Min guard;Set up;Sit to/from stand Lower Body Dressing Details (indicate cue type and reason): Able to adjust socks in sitting Toilet Transfer: Min guard;Ambulation;Comfort height toilet;Grab bars           Functional mobility during ADLs: Min guard;Minimal assistance       Vision         Perception     Praxis      Pertinent Vitals/Pain Pain Assessment: No/denies pain     Hand Dominance     Extremity/Trunk Assessment Upper Extremity Assessment Upper Extremity Assessment: Overall WFL for tasks assessed   Lower Extremity Assessment Lower Extremity Assessment: Defer to PT evaluation   Cervical / Trunk Assessment Cervical / Trunk Assessment: Normal   Communication Communication Communication: No difficulties   Cognition Arousal/Alertness: Awake/alert Behavior During Therapy: WFL for tasks assessed/performed                                   General Comments: Mild memory deficits, could not recall PT session yesterday.   General Comments  Exercises     Shoulder Instructions      Home Living Family/patient expects to be discharged to:: Private residence Living Arrangements: Alone Available Help at Discharge: Family;Available 24 hours/day Type of Home: House Home Access: Stairs to enter [employer] of Steps: 2 Entrance Stairs-Rails: None Home Layout: One level     Bathroom Shower/Tub: Tub/shower unit;Curtain   Biochemist, clinical: Standard     Home Equipment: Environmental consultant - 2 wheels;Shower  seat;Wheelchair - manual          Prior Functioning/Environment Level of Independence: Independent                 OT Problem List: Impaired balance (sitting and/or standing);Decreased activity tolerance;Decreased safety awareness;Decreased knowledge of use of DME or AE      OT Treatment/Interventions: Self-care/ADL training;Energy conservation;DME and/or AE instruction;Therapeutic activities;Patient/family education;Balance training    OT Goals(Current goals can be found in the care plan section) Acute Rehab OT Goals Patient Stated Goal: to walk more and get home with daughter OT Goal Formulation: With patient Time For Goal Achievement: 10/30/16 Potential to Achieve Goals: Good ADL Goals Pt Will Perform Grooming: with modified independence;standing (x3 tasks) Pt Will Perform Lower Body Bathing: with modified independence;sit to/from stand Pt Will Perform Lower Body Dressing: sit to/from stand;with modified independence Pt Will Perform Tub/Shower Transfer: Tub transfer;with supervision;ambulating;shower seat  OT Frequency: Min 2X/week   Barriers to D/C:            Co-evaluation              AM-PAC PT "6 Clicks" Daily Activity     Outcome Measure Help from another person eating meals?: None Help from another person taking care of personal grooming?: A Little Help from another person toileting, which includes using toliet, bedpan, or urinal?: A Little Help from another person bathing (including washing, rinsing, drying)?: A Little Help from another person to put on and taking off regular upper body clothing?: A Little Help from another person to put on and taking off regular lower body clothing?: A Little 6 Click Score: 19   End of Session Equipment Utilized During Treatment: Gait belt  Activity Tolerance: Patient tolerated treatment well Patient left: in chair;with call bell/phone within reach  OT Visit Diagnosis: Other abnormalities of gait and mobility  (R26.89);Unsteadiness on feet (R26.81)                Time: 5397-6734 OT Time Calculation (min): 14 min Charges:  OT General Charges $OT Visit: 1 Procedure OT Evaluation $OT Eval Moderate Complexity: 1 Procedure G-Codes:     Hayde Kilgour A. Ulice Brilliant, M.S., OTR/L Pager: [phone] 10/16/2016, 3:50 PM

## 2016-10-16 NOTE — Care Management Note (Signed)
Case Management Note  Patient Details  Name: Ashley Peters MRN: [id number] Date of Birth: [birth year]  Subjective/Objective:                    Action/Plan:  PT recommending home health PT at discharge. Spoke to patient at bedside confirmed face sheet information. Patient agreeable to home health PT but would like to discuss it with her daughter this evening. Provided list of home health agencies and left at bedside. Patient already has walker and wheelchair at home and does not want a bedside commode.   Will follow up with patient tomorrow am.  Expected Discharge Date:                  Expected Discharge Plan:  [place]  In-House Referral:     Discharge planning Services  CM Consult  Post Acute Care Choice:  Home Health Choice offered to:  Patient  DME Arranged:    DME Agency:     HH Arranged:    [place] Agency:     Status of Service:  In process, will continue to follow  If discussed at Long Length of Stay Meetings, dates discussed:    Additional Comments:  Marilu Favre, RN 10/16/2016, 11:43 AM

## 2016-10-17 MED ORDER — ACETAMINOPHEN 500 MG PO TABS: 1000.0000 mg | ORAL_TABLET | Freq: Four times a day (QID) | ORAL | 0 refills | Status: AC | PRN

## 2016-10-17 MED ORDER — GABAPENTIN 300 MG PO CAPS: 300.0000 mg | ORAL_CAPSULE | Freq: Two times a day (BID) | ORAL | 0 refills | Status: DC

## 2016-10-17 NOTE — Progress Notes (Signed)
OT Cancellation Note  Patient Details Name: Ashley Peters MRN: [id number] DOB: [birth year]   Cancelled Treatment:    Reason Eval/Treat Not Completed: Other (comment).  Attempted skilled OT.  Pt. Minutes away from d/c home.  Dtr. Present.  Both state they feel comfortable with how pt. Is completing ADLS at this time and declined need for any review prior to home.  No further questions or concerns.    Janice Coffin, COTA/L 10/17/2016, 11:47 AM

## 2016-10-17 NOTE — Discharge Instructions (Signed)
[facility]      [facility] [phone]  OPEN ABDOMINAL SURGERY: POST OP INSTRUCTIONS  Always review your discharge instruction sheet given to you by the facility where your surgery was performed.  IF YOU HAVE DISABILITY OR FAMILY LEAVE FORMS, YOU MUST BRING THEM TO THE OFFICE FOR PROCESSING.  PLEASE DO NOT GIVE THEM TO YOUR DOCTOR.  1. A prescription for pain medication may be given to you upon discharge.  Take your pain medication as prescribed, if needed.  If narcotic pain medicine is not needed, then you may take acetaminophen (Tylenol) or ibuprofen (Advil) as needed. 2. Take your usually prescribed medications unless otherwise directed. 3. If you need a refill on your pain medication, please contact your pharmacy. They will contact our office to request authorization.  Prescriptions will not be filled after 5pm or on week-ends. 4. You should follow a light diet the first few days after arrival home, such as soup and crackers, pudding, etc.unless your doctor has advised otherwise. A high-fiber, low fat diet can be resumed as tolerated.   Be sure to include lots of fluids daily. Most patients will experience some swelling and bruising on the chest and neck area.  Ice packs will help.  Swelling and bruising can take several days to resolve 5. Most patients will experience some swelling and bruising in the area of the incision. Ice pack will help. Swelling and bruising can take several days to resolve..  6. It is common to experience some constipation if taking pain medication after surgery.  Increasing fluid intake and taking a stool softener will usually help or prevent this problem from occurring.  A mild laxative (Milk of Magnesia or Miralax) should be taken according to package directions if there are no bowel movements after 48 hours. 7.  You may have steri-strips (small skin tapes) in place directly over the incision.  These strips should be left on the skin for 7-10 days.  If your  surgeon used skin glue on the incision, you may shower in 24 hours.  The glue will flake off over the next 2-3 weeks.  Any sutures or staples will be removed at the office during your follow-up visit. You may find that a light gauze bandage over your incision may keep your staples from being rubbed or pulled. You may shower and replace the bandage daily. 8. ACTIVITIES:  You may resume regular (light) daily activities beginning the next day--such as daily self-care, walking, climbing stairs--gradually increasing activities as tolerated.  You may have sexual intercourse when it is comfortable.  Refrain from any heavy lifting or straining until approved by your doctor. a. You may drive when you no longer are taking prescription pain medication, you can comfortably wear a seatbelt, and you can safely maneuver your car and apply brakes b. Return to Work: ___________________________________ 9. You should see your doctor in the office for a follow-up appointment approximately two weeks after your surgery.  Make sure that you call for this appointment within a day or two after you arrive home to insure a convenient appointment time. OTHER INSTRUCTIONS:  _____________________________________________________________ _____________________________________________________________  WHEN TO CALL YOUR DOCTOR: 1. Fever over 101.0 2. Inability to urinate 3. Nausea and/or vomiting 4. Extreme swelling or bruising 5. Continued bleeding from incision. 6. Increased pain, redness, or drainage from the incision. 7. Difficulty swallowing or breathing 8. Muscle cramping or spasms. 9. Numbness or tingling in hands or feet or around lips.  The clinic staff is available to   answer your questions during regular business hours.  Please don't hesitate to call and ask to speak to one of the nurses if you have concerns.  For further questions, please visit [email or web address]   

## 2016-10-17 NOTE — Progress Notes (Signed)
Patient discharged to home with instructions. 

## 2016-10-17 NOTE — Care Management Note (Signed)
Case Management Note  Patient Details  Name: Ashley Peters MRN: [id number] Date of Birth: 02/17/1929  Subjective/Objective:                    Action/Plan:   Expected Discharge Date:  10/17/16               Expected Discharge Plan:  [place]  In-House Referral:     Discharge planning Services  CM Consult  Post Acute Care Choice:  Home Health Choice offered to:  Patient  DME Arranged:    DME Agency:     HH Arranged:  PT [place]:  [place]  Status of Service:  Completed, signed off  If discussed at [place] of Stay Meetings, dates discussed:    Additional Comments:  Marilu Favre, RN 10/17/2016, 9:49 AM

## 2016-10-17 NOTE — Discharge Summary (Signed)
Physician Discharge Summary  Patient ID: Ashley Peters MRN: [id number] DOB/AGE: 1929-01-11 83 y.o.  Admit date: 10/14/2016 Discharge date: 10/17/2016  Admission Diagnoses:  Right colon cancer  Discharge Diagnoses: same Active Problems:   Cancer of right colon [facility])   Discharged Condition: good  Hospital Course: Right hemicolectomy/ repair of ventral hernia 10/24/16.  The patient has done remarkably well since surgery.  Pain is under good control with no need for narcotics.  She regained bowel function quickly and Entereg was stopped.  Anemia is improved after transfusion.  She has been ambulating with minimal assistance.    Consults: Physical Therapy  Significant Diagnostic Studies: Pathology still pending  Treatments: therapies: PT and surgery: as above  Discharge Exam: Blood pressure 120/60, pulse 63, temperature 98.3 F (36.8 C), resp. rate 16, height 4\' 9"  (1.448 m), weight 47.7 kg (105 lb 3.2 oz), SpO2 98 %. General appearance: alert, cooperative and no distress Resp: clear to auscultation bilaterally Cardio: regular rate and rhythm, S1, S2 normal, no murmur, click, rub or gallop GI: soft, minimal tenderness; + BS Incision - staple line c/d/i  Disposition: 01-Home or Self Care Home health Physical Therapy   Discharge Instructions    Call MD for:  persistant nausea and vomiting    Complete by:  As directed    Call MD for:  redness, tenderness, or signs of infection (pain, swelling, redness, odor or green/yellow discharge around incision site)    Complete by:  As directed    Call MD for:  severe uncontrolled pain    Complete by:  As directed    Call MD for:  temperature >100.4    Complete by:  As directed    Diet general    Complete by:  As directed    Driving Restrictions    Complete by:  As directed    Do not drive while taking pain medications   Increase activity slowly    Complete by:  As directed    May shower / Bathe    Complete by:  As directed       Allergies as of 10/17/2016      Reactions   Benicar [olmesartan Medoxomil]    Elevated Potassium   Amoxicillin Rash   Has patient had a PCN reaction causing immediate rash, facial/tongue/throat swelling, SOB or lightheadedness with hypotension: Yes Has patient had a PCN reaction causing severe rash involving mucus membranes or skin necrosis: No Has patient had a PCN reaction that required hospitalization: No Has patient had a PCN reaction occurring within the last 10 years: No If all of the above answers are "NO", then may proceed with Cephalosporin use.      Medication List    TAKE these medications   acetaminophen 500 MG tablet Commonly known as:  TYLENOL Take 2 tablets (1,000 mg total) by mouth every 6 (six) hours as needed for moderate pain. What changed:  when to take this   Calcium Carbonate-Vitamin D 600-200 MG-UNIT Tabs Commonly known as:  CALCIUM 600+D Take 1 tablet by mouth daily.   cholecalciferol 1000 units tablet Commonly known as:  VITAMIN D Take 1,000 Units by mouth daily.   ferrous sulfate 325 (65 FE) MG tablet Take 1 tablet (325 mg total) by mouth 2 (two) times daily with a meal.   gabapentin 300 MG capsule Commonly known as:  NEURONTIN Take 1 capsule (300 mg total) by mouth 2 (two) times daily.   omeprazole 20 MG capsule Commonly known as:  PRILOSEC Take  1 capsule (20 mg total) by mouth 2 (two) times daily before a meal.   PRESERVISION AREDS 2 PO Take 1 capsule by mouth 2 (two) times daily.      Follow-up Information    Donnie Mesa, MD Follow up in 1 week(s).   Specialty:  General Surgery Contact information: [place] [address] [phone]           Signed: Maia Petties. 10/17/2016, 8:26 AM

## 2016-10-18 LAB — TYPE AND SCREEN
ABO/RH(D): O POS
ANTIBODY SCREEN: NEGATIVE
UNIT DIVISION: 0
UNIT DIVISION: 0
Unit division: 0
Unit division: 0

## 2016-10-18 LAB — BPAM RBC
BLOOD PRODUCT EXPIRATION DATE: 201809012359
Blood Product Expiration Date: 201809012359
Blood Product Expiration Date: 201809052359
Blood Product Expiration Date: 201809052359
ISSUE DATE / TIME: 201808071453
ISSUE DATE / TIME: 201808071453
UNIT TYPE AND RH: 5100
UNIT TYPE AND RH: 5100
Unit Type and Rh: 5100
Unit Type and Rh: 5100

## 2016-10-23 ENCOUNTER — Encounter: Payer: Self-pay | Admitting: Hematology

## 2016-10-23 ENCOUNTER — Telehealth: Payer: Self-pay | Admitting: Hematology

## 2016-10-23 NOTE — Telephone Encounter (Signed)
Pt has been scheduled to see Dr. Burr Medico on 8/31 at 11am. Pt aware to arrive 30 minutes early. Address verified. Letter and directions mailed to the pt.

## 2016-10-28 DIAGNOSIS — H35731 Hemorrhagic detachment of retinal pigment epithelium, right eye: Secondary | ICD-10-CM | POA: Diagnosis not present

## 2016-10-28 DIAGNOSIS — H353113 Nonexudative age-related macular degeneration, right eye, advanced atrophic without subfoveal involvement: Secondary | ICD-10-CM | POA: Diagnosis not present

## 2016-10-28 DIAGNOSIS — H353123 Nonexudative age-related macular degeneration, left eye, advanced atrophic without subfoveal involvement: Secondary | ICD-10-CM | POA: Diagnosis not present

## 2016-10-28 DIAGNOSIS — H353212 Exudative age-related macular degeneration, right eye, with inactive choroidal neovascularization: Secondary | ICD-10-CM | POA: Diagnosis not present

## 2016-11-06 NOTE — Progress Notes (Signed)
[place]  [phone] Fax:([phone]) [place] Note   Patient Care Team: Dettinger, [name], MD as PCP - General (Family Medicine) Clent Jacks, MD as Consulting Physician (Ophthalmology) Zadie Rhine Clent Demark, MD as Consulting Physician (Ophthalmology) 11/07/2016  Referring physician: Dr. Georgette Dover  CHIEF COMPLAINTS/PURPOSE OF CONSULTATION:  Cancer of the right colon  HISTORY OF PRESENTING ILLNESS:  Ashley Peters 83 y.o. female is here because of cancer of the right colon. She presents with her daughter and husband. Initially, pt presented to her PCP's office on 09/19/16 for a 3mocheck-up and with complaints of worsening right lower quadrant pain that had been ongoing for several weeks. This was accompanied by fatigue, chills, and weight loss of approximately 30lbs over the past year. However, she reports that she was enrolled in a nutrition program for diabetics to help her lose weight (she is only borderline diabetic). Her daughter was able to notice her fatigue as well. She had not noticed any bloody stools, however, she did notice some melena but was on iron supplements for her anemia. She denies any significant constipation with this. She had also noticed a small area of distension over her area of pain in the right lower abdomen which she had not noticed previously. CT A/P was performed the same day which was remarkable for a large 9.5cm intraluminal mass in the cecum and ascending colon, consistent with colon carcinoma. There was tumor extension into the adjacent pericolonic fat seen along the lateral abdominal wall as well. Additionally, mild right pericolonic and mesenteric lymphadenopathy was also seen which was suspicious for metastatic disease. An epigastric ventral hernia was also found to be present on this scan.   Following these findings, she was subsequently referred to Dr TGeorgette Doverwho performed both an open right hemicolectomy and epigastric  ventral hernia repair on 10/14/16. She tolerated this procedure well and was monitored in the hospital until her discharge on 10/17/16, during which she recovered well. She was referred to medical oncology following her surgery and she has been recovering well since d/c. Since her surgery she denies any pain returning to the area. Her bowel movements have been normal and regular since her surgery and her appetite has been well. Her fatigue has also been improving and back to normal.   [place]   MEDICAL HISTORY:  Past Medical History:  Diagnosis Date  . Anemia   . Cancer [place]   . Diverticulosis   . GERD (gastroesophageal reflux disease)   . History of kidney stones   . Hyperlipidemia   . Hypertension   . Macular degeneration   . Osteopenia     SURGICAL HISTORY: Past Surgical History:  Procedure Laterality Date  . ABDOMINAL HYSTERECTOMY    . ablation tr Great saphenous vein  10/17/2009  . brest biopsy  1971  . CHOLECYSTECTOMY    . EPIGASTRIC HERNIA REPAIR N/A 10/14/2016   Procedure: REPAIR OF EPIGASTRIC VENTRAL HERNIA;  Surgeon: TDonnie Mesa MD;  [facility]  Service: General;  Laterality: N/A;  . lt. distal ureteral stone extraction  09/14/1991  . PARTIAL COLECTOMY Right 10/14/2016   Procedure: OPEN RIGHT HEMICOLECTOMY;  Surgeon: TDonnie Mesa MD;  [place] [facility]  Service: General;  Laterality: Right;    SOCIAL HISTORY: Social History   Social History  . Marital status: Widowed    Spouse name: N/A  . Number of children: N/A  . Years of  education: N/A   Occupational History  . Not on file.   Social History Main Topics  . Smoking status: Never Smoker  . Smokeless tobacco: Never Used  . Alcohol use No  . Drug use: No  . Sexual activity: Yes   Other Topics Concern  . Not on file   Social History Narrative  . No narrative on file    FAMILY HISTORY: Family History  Problem Relation Age of  Onset  . Stroke Father   . Cancer Sister        breast  . Diabetes Brother   . Kidney disease Brother   . Heart disease Brother   . Epilepsy Son   . Cancer Other        colon cancer in her nephew   . Cancer Other 60       HL and uteral cancer    ALLERGIES:  is allergic to benicar [olmesartan medoxomil] and amoxicillin.  MEDICATIONS:  Current Outpatient Prescriptions  Medication Sig Dispense Refill  . acetaminophen (TYLENOL) 500 MG tablet Take 2 tablets (1,000 mg total) by mouth every 6 (six) hours as needed for moderate pain. 30 tablet 0  . Calcium Carbonate-Vitamin D (CALCIUM 600+D) 600-200 MG-UNIT TABS Take 1 tablet by mouth daily.  0  . cholecalciferol (VITAMIN D) 1000 units tablet Take 1,000 Units by mouth daily.    . ferrous sulfate 325 (65 FE) MG tablet Take 1 tablet (325 mg total) by mouth 2 (two) times daily with a meal. 60 tablet 3  . gabapentin (NEURONTIN) 300 MG capsule Take 1 capsule (300 mg total) by mouth 2 (two) times daily. 50 capsule 0  . Multiple Vitamins-Minerals (PRESERVISION AREDS 2 PO) Take 1 capsule by mouth 2 (two) times daily.     No current facility-administered medications for this visit.     REVIEW OF SYSTEMS:   Constitutional: Denies fevers, chills or abnormal night sweats Eyes: Denies blurriness of vision, double vision or watery eyes Ears, nose, mouth, throat, and face: Denies mucositis or sore throat Respiratory: Denies cough, dyspnea or wheezes Cardiovascular: Denies palpitation, chest discomfort or lower extremity swelling Gastrointestinal:  Denies nausea, heartburn or change in bowel habits Skin: Denies abnormal skin rashes Lymphatics: Denies new lymphadenopathy or easy bruising Neurological:Denies numbness, tingling or new weaknesses Behavioral/Psych: Mood is stable, no new changes  All other systems were reviewed with the patient and are negative.  PHYSICAL EXAMINATION:  ECOG PERFORMANCE STATUS: 1 - Symptomatic but completely  ambulatory  Vitals:   11/07/16 1118  BP: (!) 146/63  Pulse: 76  Resp: 18  Temp: 98.5 F (36.9 C)  SpO2: 98%   Filed Weights   11/07/16 1118  Weight: 112 lb 1.6 oz (50.8 kg)    GENERAL:alert, no distress and comfortable SKIN: skin color, texture, turgor are normal, no rashes or significant lesions EYES: normal, conjunctiva are pink and non-injected, sclera clear OROPHARYNX:no exudate, no erythema and lips, buccal mucosa, and tongue normal  NECK: supple, thyroid normal size, non-tender, without nodularity LYMPH:  no palpable lymphadenopathy in the cervical, axillary or inguinal LUNGS: clear to auscultation and percussion with normal breathing effort HEART: regular rate & rhythm and no murmurs and no lower extremity edema ABDOMEN:abdomen soft, non-tender and normal bowel sounds. Midline surgical incision has healed well. Musculoskeletal:no cyanosis of digits and no clubbing  PSYCH: alert & oriented x 3 with fluent speech NEURO: no focal motor/sensory deficits  LABORATORY DATA:  I have reviewed the data as listed CBC Latest Ref Rng &  Units 10/16/2016 10/15/2016 10/13/2016  WBC 4.0 - 10.5 K/uL 7.6 7.4 7.9  Hemoglobin 12.0 - 15.0 g/dL 2.8(B) 1.5(V) 7.6(H)  Hematocrit 36.0 - 46.0 % 30.5(L) 32.1(L) 28.6(L)  Platelets 150 - 400 K/uL 386 376 491(H)   PATHOLOGY REPORTS:   10/14/16 Diagnosis Colon, segmental resection for tumor, Right ADENOCARCINOMA WITH EXTENSIVE EXTRA CELLULAR MUCIN, GRADE 1, (11.0 CM) THE TUMOR INVADES THROUGH THE CECUM WALL INTO ADJACENT TERMINAL ILEUM (PT4B) NINETEEN BENIGN LYMPH NODES (0/19) SUPPURATIVE INFLAMMATION WITH FIBROSIS AND ADHESION TO ABDOMINAL WALL NO ADENOCARCINOMA IDENTIFIED Microscopic Comment COLON AND RECTUM (INCLUDING TRANS-ANAL RESECTION): Specimen: Right colon with terminal ileum Procedure: Segmental resection Tumor site: Cecum and terminal ileum Specimen integrity: Intact Macroscopic intactness of mesorectum: Not applicable: X Complete:  NA Near complete: NA Incomplete: NA Cannot be determined (specify): NA Macroscopic tumor perforation: Invades adjacent structure Invasive tumor: Maximum size: 11.0 cm Histologic type(s): Adenocarcinoma with extracellular mucin Histologic grade and differentiation: G1: well differentiated/low grade G2: moderately differentiated/low grade G3: poorly differentiated/high grade G4: undifferentiated/high grade Type of polyp in which invasive carcinoma arose: Tubular adenoma Microscopic extension of invasive tumor: Invades adjacent terminal ileum 1 of 5 Supplemental copy SUPPLEMENTAL for EVEE, LISKA ([id number]) Microscopic Comment(continued) Lymph-Vascular invasion: Negative Peri-neural invasion: Negative Tumor deposit(s) (discontinuous extramural extension): Negative Resection margins: Proximal margin: Negative Distal margin: Negative Circumferential (radial) (posterior ascending, posterior descending; lateral and posterior mid-rectum; and entire lower 1/3 rectum):NA Mesenteric margin (sigmoid and transverse): NA Distance closest margin (if all above margins negative): 1.5 cm from abdominal wall adhesion resection Trans-anal resection margins only: Deep margin: NA Mucosal Margin: NA Distance closest mucosal margin (if negative): NA Treatment effect (neo-adjuvant therapy): Negative Additional polyp(s): Negative Non-neoplastic findings: Unremarkable Lymph nodes: number examined 19; number positive: 0 Pathologic Staging: pT4b, pN0, pMx Ancillary studies: Ordered  RADIOGRAPHIC STUDIES: I have personally reviewed the radiological images as listed and agreed with the findings in the report. No results found.   CT A/P 09/19/16 IMPRESSION: Large 9.5 cm intraluminal mass in the cecum and ascending colon, consistent with colon carcinoma. Tumor extension into adjacent pericolonic fat seen along the lateral wall.  Mild right pericolonic and mesenteric lymphadenopathy,  suspicious for metastatic disease. No other sites of metastatic disease identified.  Colonic diverticulosis, without radiographic evidence of diverticulitis. Tiny hiatal hernia, and small epigastric ventral hernia containing only fat.  Aortic atherosclerosis.  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 She was found to have right-sided colon cancer beginning in the cecum. She did have an impressively large at 11cm tumor which invaded around the adjacent small intestine which was removed without complication by Dr Corliss Skains. Her surgical margins were negative. All 19 lymph nodes were negative. -I reviewed her staging CT abd/pel scan, which was negative for metastatic disease. I'll get a CT chest to complete his staging. -She had stage II disease, early-stage but with high risk features, because of her T4b tumor  - I discussed her recurrence risk, likely in 20-40%, especially local recurrence  -Given her high risk of recurrence, I recommend her to consider adjuvant Xeloda. She is 25, but a very healthy and functional, she may be able tolerate Xeloda well. Due to her advanced age, I recommend her take Xeloda for 3 months, instead of standard 6 months therapy.  --Chemotherapy consent: Side effects including but does not not limited to, fatigue, nausea, vomiting, diarrhea, hair  loss, neuropathy, fluid retention, renal and kidney dysfunction, neutropenic fever, needed for blood transfusion, bleeding, were discussed with patient in great detail. She agrees to proceed. -The goal of care is curative. -I discussed the risk of cancer recurrence in the future. I also discussed the surveillance plan, which is a physical exam and lab test (including CBC, CMP and CEA) every 3 months for the first 2 years, then every 6-12 months, colonoscopy in one year, and surveilliance CT scan  every 6-12 month for up to 5 years.  -She would like to continue with Xeloda, which she will take at 3 tablets BID x 14 days. She will be off for 7 days and then begin another cycle and continue this for 3-4 months as tolerated.  -I will also order a CT Chest and she will begin chemo class on the same day in 1-2 weeks. -Labs including Fe study, CBC, CMP, CEA and f/u after this.  2.) Genetics -Due to her prevalent family history of cancer, I suggested that she should follow up with genetics for testing. She would like to have this performed.  -She does have two sons and one daughter, I also informed her that if her genetic testing were to come back with abnormal results that her children should be tested as well.  -Also advised that her children should have routine colonoscopies.   3.) Anemia -We will monitor her Hgb and Iron levels. If these continue to worsen or do not improve, we will consider infusion to aid with this.  -I encouraged her to continue taking her iron supplements at home.   -We will monitor this with her lab work.   4.) Borderline DM and HTN: -She was on BP medication prior to her weight loss, she was taken off of this following her weight loss.   -Continue with weight loss plan for management and I encouraged her to maintain a good diet to manage her sugars.  -She will continue f/u w/ her PCP for this.   PLAN:  -Lab today.  -Chemo class and CT chest scan in 1-2 weeks.  -start xeloda in 2 weeks  -Lab and f/u on week of 12/03/16.   No orders of the defined types were placed in this encounter.   All questions were answered. The patient knows to call the clinic with any problems, questions or concerns. I spent 55 minutes counseling the patient face to face. The total time spent in the appointment was 60 minutes and more than 50% was on counseling.   This document serves as a record of services personally performed by Truitt Merle, MD. It was created on her behalf by Reola Mosher, a trained medical scribe. The creation of this record is based on the scribe's personal observations and the provider's statements to them. This document has been checked and approved by the attending provider.   Truitt Merle, MD 11/07/2016 12:10 PM

## 2016-11-07 ENCOUNTER — Telehealth: Payer: Self-pay

## 2016-11-07 ENCOUNTER — Ambulatory Visit (HOSPITAL_BASED_OUTPATIENT_CLINIC_OR_DEPARTMENT_OTHER): Payer: Medicare Other

## 2016-11-07 ENCOUNTER — Ambulatory Visit (HOSPITAL_BASED_OUTPATIENT_CLINIC_OR_DEPARTMENT_OTHER): Payer: Medicare Other | Admitting: Hematology

## 2016-11-07 ENCOUNTER — Encounter: Payer: Self-pay | Admitting: Hematology

## 2016-11-07 VITALS — BP 146/63 | HR 76 | Temp 98.5°F | Resp 18 | Ht <= 58 in | Wt 112.1 lb

## 2016-11-07 DIAGNOSIS — I10 Essential (primary) hypertension: Secondary | ICD-10-CM

## 2016-11-07 DIAGNOSIS — D500 Iron deficiency anemia secondary to blood loss (chronic): Secondary | ICD-10-CM

## 2016-11-07 DIAGNOSIS — E119 Type 2 diabetes mellitus without complications: Secondary | ICD-10-CM | POA: Diagnosis not present

## 2016-11-07 DIAGNOSIS — C180 Malignant neoplasm of cecum: Secondary | ICD-10-CM | POA: Diagnosis not present

## 2016-11-07 DIAGNOSIS — D649 Anemia, unspecified: Secondary | ICD-10-CM | POA: Diagnosis not present

## 2016-11-07 DIAGNOSIS — C182 Malignant neoplasm of ascending colon: Secondary | ICD-10-CM

## 2016-11-07 LAB — CBC & DIFF AND RETIC
BASO%: 1.4 % (ref 0.0–2.0)
BASOS ABS: 0.1 10*3/uL (ref 0.0–0.1)
EOS ABS: 0.5 10*3/uL (ref 0.0–0.5)
EOS%: 6.2 % (ref 0.0–7.0)
HEMATOCRIT: 37.3 % (ref 34.8–46.6)
HEMOGLOBIN: 11.3 g/dL — AB (ref 11.6–15.9)
IMMATURE RETIC FRACT: 3.3 % (ref 1.60–10.00)
LYMPH%: 21.9 % (ref 14.0–49.7)
MCH: 24.6 pg — AB (ref 25.1–34.0)
MCHC: 30.3 g/dL — ABNORMAL LOW (ref 31.5–36.0)
MCV: 81.3 fL (ref 79.5–101.0)
MONO#: 0.4 10*3/uL (ref 0.1–0.9)
MONO%: 5.7 % (ref 0.0–14.0)
NEUT%: 64.8 % (ref 38.4–76.8)
NEUTROS ABS: 5.1 10*3/uL (ref 1.5–6.5)
Platelets: 252 10*3/uL (ref 145–400)
RBC: 4.59 10*6/uL (ref 3.70–5.45)
RDW: 25.1 % — AB (ref 11.2–14.5)
RETIC %: 1.16 % (ref 0.70–2.10)
Retic Ct Abs: 53.24 10*3/uL (ref 33.70–90.70)
WBC: 7.8 10*3/uL (ref 3.9–10.3)
lymph#: 1.7 10*3/uL (ref 0.9–3.3)

## 2016-11-07 LAB — COMPREHENSIVE METABOLIC PANEL
ALT: 11 U/L (ref 0–55)
ANION GAP: 6 meq/L (ref 3–11)
AST: 15 U/L (ref 5–34)
Albumin: 3.1 g/dL — ABNORMAL LOW (ref 3.5–5.0)
Alkaline Phosphatase: 83 U/L (ref 40–150)
BUN: 15.2 mg/dL (ref 7.0–26.0)
CHLORIDE: 109 meq/L (ref 98–109)
CO2: 27 meq/L (ref 22–29)
CREATININE: 0.6 mg/dL (ref 0.6–1.1)
Calcium: 9.9 mg/dL (ref 8.4–10.4)
EGFR: 81 mL/min/{1.73_m2} — ABNORMAL LOW (ref >90)
Glucose: 80 mg/dl (ref 70–140)
POTASSIUM: 4.8 meq/L (ref 3.5–5.1)
Sodium: 142 mEq/L (ref 136–145)
Total Bilirubin: 0.63 mg/dL (ref 0.20–1.20)
Total Protein: 6.6 g/dL (ref 6.4–8.3)

## 2016-11-07 LAB — IRON AND TIBC
%SAT: 13 % — AB (ref 21–57)
IRON: 36 ug/dL — AB (ref 41–142)
TIBC: 271 ug/dL (ref 236–444)
UIBC: 235 ug/dL (ref 120–384)

## 2016-11-07 LAB — CEA (IN HOUSE-CHCC): CEA (CHCC-IN HOUSE): 2.66 ng/mL (ref 0.00–5.00)

## 2016-11-07 LAB — FERRITIN: FERRITIN: 80 ng/mL (ref 9–269)

## 2016-11-07 LAB — TECHNOLOGIST REVIEW

## 2016-11-07 NOTE — Telephone Encounter (Signed)
Gave patient avs and calender for September. Per los

## 2016-11-07 NOTE — Progress Notes (Signed)
Met with patient, daughter and son-in-law prior to appointment with Dr. Burr Medico.  I introduced myself and what my role as GI Navigator entailed. Written information from [facility] and [facility] pertaining to diagnosis and treatment provided to patient. Patient also provided with information on how to contact her treatment team. Patient encouraged to call with any questions and concerns.

## 2016-11-09 ENCOUNTER — Encounter: Payer: Self-pay | Admitting: Hematology

## 2016-11-09 MED ORDER — CAPECITABINE 500 MG PO TABS: 1000.0000 mg/m2 | ORAL_TABLET | Freq: Two times a day (BID) | ORAL | 2 refills | Status: DC

## 2016-11-11 ENCOUNTER — Other Ambulatory Visit: Payer: Self-pay | Admitting: Hematology

## 2016-11-11 DIAGNOSIS — C182 Malignant neoplasm of ascending colon: Secondary | ICD-10-CM

## 2016-11-12 ENCOUNTER — Telehealth: Payer: Self-pay | Admitting: Pharmacist

## 2016-11-12 DIAGNOSIS — C182 Malignant neoplasm of ascending colon: Secondary | ICD-10-CM

## 2016-11-12 MED ORDER — CAPECITABINE 500 MG PO TABS: 1000.0000 mg/m2 | ORAL_TABLET | Freq: Two times a day (BID) | ORAL | 2 refills | Status: DC

## 2016-11-12 MED FILL — CAPECITABINE 500 MG TABLET: 500 | 14 days supply | Qty: 84 | Fill #0

## 2016-11-12 NOTE — Telephone Encounter (Signed)
Oral Oncology Pharmacist Encounter  Received new prescription for Xeloda for the adjuvant treatment of colon cancer, planned duration 3-4 months.  Labs from 11/07/16 assessed, OK for treatment.  Current medication list in Epic reviewed, no significant DDIs with Xeloda identified.  Prescription has been e-scribed to the [facility] for benefits analysis and approval.  Noted patient scheduled for chemotherapy education class on 11/19/16, planned Xeloda start date after that.   I spoke with patient for overview of: Xeloda.   Counseled patient on administration, dosing, side effects, safe handling, and monitoring. Patient will take Xeloda 500mg  tablets, 3 tablets (1500mg ) by mouth in AM and 3 tabs (1500mg ) by mouth in PM, within 25min of finishing a meal, for 14 days on, 7 days off, repeat every 21 days for planned 3-4 months.  Side effects include but not limited to: fatigue, GI upset, diarrhea, hand-foot syndrome, and decreased blood counts.    Reviewed with patient importance of keeping a medication schedule and plan for any missed doses.  Ms. Marcelli voiced understanding and appreciation.   All questions answered.   Patient knows to call the office with questions or concerns.   Oral Oncology Clinic will continue to follow for insurance authorization, copayment issues, and start date.  Thank you,  Johny Drilling, PharmD, BCPS, BCOP 11/12/2016  12:14 PM Oral Oncology Clinic [phone]

## 2016-11-13 ENCOUNTER — Telehealth: Payer: Self-pay | Admitting: *Deleted

## 2016-11-13 NOTE — Telephone Encounter (Signed)
TCT patient to notify her of lab results. Spoke with patient. Advised that she still has mild iron deficient anemia and to continue her iron supplement tablets.  Pt voiced understanding. No other questions or concerns.

## 2016-11-13 NOTE — Telephone Encounter (Signed)
Oral Chemotherapy Pharmacist Encounter  Received notification from [facility] that Xeloda prescription shipped to patient on 9/5 for delivery 11/13/16.  Copayment $11 for each 3 week fill, patient is aware and can afford this copayment.  Oral Oncology Clinic will continue to follow.  Thank you,  Johny Drilling, PharmD, BCPS, BCOP 11/13/2016  9:59 AM Oral Oncology Clinic [phone]

## 2016-11-19 ENCOUNTER — Ambulatory Visit (HOSPITAL_COMMUNITY)
Admission: RE | Admit: 2016-11-19 | Discharge: 2016-11-19 | Disposition: A | Discharge status: Home / Self Care | Payer: Medicare Other | Source: Ambulatory Visit | Attending: Hematology | Admitting: Hematology

## 2016-11-19 ENCOUNTER — Encounter (HOSPITAL_COMMUNITY): Payer: Self-pay

## 2016-11-19 ENCOUNTER — Encounter: Payer: Self-pay | Admitting: *Deleted

## 2016-11-19 ENCOUNTER — Other Ambulatory Visit: Payer: Medicare Other

## 2016-11-19 DIAGNOSIS — I2510 Atherosclerotic heart disease of native coronary artery without angina pectoris: Secondary | ICD-10-CM | POA: Insufficient documentation

## 2016-11-19 DIAGNOSIS — C182 Malignant neoplasm of ascending colon: Secondary | ICD-10-CM | POA: Diagnosis not present

## 2016-11-19 DIAGNOSIS — C189 Malignant neoplasm of colon, unspecified: Secondary | ICD-10-CM | POA: Diagnosis not present

## 2016-11-19 DIAGNOSIS — I700 Atherosclerosis of aorta: Secondary | ICD-10-CM | POA: Insufficient documentation

## 2016-11-27 ENCOUNTER — Other Ambulatory Visit: Payer: Self-pay | Admitting: Family Medicine

## 2016-11-27 DIAGNOSIS — Z1231 Encounter for screening mammogram for malignant neoplasm of breast: Secondary | ICD-10-CM

## 2016-11-27 MED FILL — CAPECITABINE 500 MG TABLET: 500 | 14 days supply | Qty: 84 | Fill #1

## 2016-12-01 NOTE — Progress Notes (Signed)
[facility]  Telephone:([phone]) [phone] Fax:([phone]) [phone]  Clinic Follow up Note   Patient Care Team: Dettinger, [name], MD as PCP - General (Family Medicine) Clent Jacks, MD as Consulting Physician (Ophthalmology) Zadie Rhine Clent Demark, MD as Consulting Physician (Ophthalmology) Donnie Mesa, MD as Consulting Physician (General Surgery) Truitt Merle, MD as Consulting Physician (Hematology) 12/03/2016   CHIEF COMPLAINTS/PURPOSE OF CONSULTATION:  Follow up cancer of the right colon     Cancer of right colon ([place])   09/19/2016 Imaging    CT A/P 09/19/16 IMPRESSION: Large 9.5 cm intraluminal mass in the cecum and ascending colon, consistent with colon carcinoma. Tumor extension into adjacent pericolonic fat seen along the lateral wall.  Mild right pericolonic and mesenteric lymphadenopathy, suspicious for metastatic disease. No other sites of metastatic disease identified.  Colonic diverticulosis, without radiographic evidence of diverticulitis. Tiny hiatal hernia, and small epigastric ventral hernia containing only fat.  Aortic atherosclerosis.      10/14/2016 Initial Diagnosis    Cancer of right colon ([place])     10/14/2016 Surgery    OPEN RIGHT HEMICOLECTOMY and REPAIR OF EPIGASTRIC VENTRAL HERNIA by Dr. Georgette Dover and Dr. Dalbert Batman       10/14/2016 Pathology Results    10/14/16 Diagnosis Colon, segmental resection for tumor, Right ADENOCARCINOMA WITH EXTENSIVE EXTRA CELLULAR MUCIN, GRADE 1, (11.0 CM) THE TUMOR INVADES THROUGH THE CECUM WALL INTO ADJACENT TERMINAL ILEUM (PT4B) NINETEEN BENIGN LYMPH NODES (0/19) SUPPURATIVE INFLAMMATION WITH FIBROSIS AND ADHESION TO ABDOMINAL WALL NO ADENOCARCINOMA IDENTIFIED      11/19/2016 Imaging    CT Chest WO Contrast 11/19/16 IMPRESSION: 1. No evidence of metastatic disease. 2. Aortic atherosclerosis (ICD10-170.0). Coronary artery calcification.      11/25/2016 -  Chemotherapy    Adjuvant Xeloda two weeks on, one week  off starting 11/25/16 for 3 months          HISTORY OF PRESENTING ILLNESS:  Ashley Peters 83 y.o. female is here because of cancer of the right colon. She presents with her daughter and husband. Initially, pt presented to her PCP's office on 09/19/16 for a 76mocheck-up and with complaints of worsening right lower quadrant pain that had been ongoing for several weeks. This was accompanied by fatigue, chills, and weight loss of approximately 30lbs over the past year. However, she reports that she was enrolled in a nutrition program for diabetics to help her lose weight (she is only borderline diabetic). Her daughter was able to notice her fatigue as well. She had not noticed any bloody stools, however, she did notice some melena but was on iron supplements for her anemia. She denies any significant constipation with this. She had also noticed a small area of distension over her area of pain in the right lower abdomen which she had not noticed previously. CT A/P was performed the same day which was remarkable for a large 9.5cm intraluminal mass in the cecum and ascending colon, consistent with colon carcinoma. There was tumor extension into the adjacent pericolonic fat seen along the lateral abdominal wall as well. Additionally, mild right pericolonic and mesenteric lymphadenopathy was also seen which was suspicious for metastatic disease. An epigastric ventral hernia was also found to be present on this scan.   Following these findings, she was subsequently referred to Dr TGeorgette Doverwho performed both an open right hemicolectomy and epigastric ventral hernia repair on 10/14/16. She tolerated this procedure well and was monitored in the hospital until her discharge on 10/17/16, during which she recovered well.  She was referred to medical oncology following her surgery and she has been recovering well since d/c. Since her surgery she denies any pain returning to the area. Her bowel movements have been normal and  regular since her surgery and her appetite has been well. Her fatigue has also been improving and back to normal.   She currently lives alone in [place], [place] and very independent and active.    CURRENT THERAPY: Adjuvant Xeloda 1556m q12h,  two weeks on, one week off starting 11/25/16 for 3 months    INTERVAL HISTORY:  JJANIJAH SYMONSis here for a follow up. She presents to the clinic today with her daughter.  She reports she started Xeloda on the 18th of September. She is tolerating well so far. She takes 6 a day. She denies stomach issues and has regular BM.  She wonders if it is necessary for her to go to her genetic counseling appointment today.  She is overall doing well. She has no new complaints or concerns.    MEDICAL HISTORY:  Past Medical History:  Diagnosis Date  . Anemia   . [place] ([facility]   . Colon cancer ([facility]   . Diverticulosis   . Family history of breast cancer   . Family history of colon cancer   . Family history of uterine cancer   . GERD (gastroesophageal reflux disease)   . History of kidney stones   . Hyperlipidemia   . Hypertension   . Macular degeneration   . Osteopenia     SURGICAL HISTORY: Past Surgical History:  Procedure Laterality Date  . ABDOMINAL HYSTERECTOMY    . ablation tr Great saphenous vein  10/17/2009  . brest biopsy  1971  . CHOLECYSTECTOMY    . EPIGASTRIC HERNIA REPAIR N/A 10/14/2016   Procedure: REPAIR OF EPIGASTRIC VENTRAL HERNIA;  Surgeon: TDonnie Mesa MD;  [facility]  Service: General;  Laterality: N/A;  . lt. distal ureteral stone extraction  09/14/1991  . PARTIAL COLECTOMY Right 10/14/2016   Procedure: OPEN RIGHT HEMICOLECTOMY;  Surgeon: TDonnie Mesa MD;  [facility]  Service: General;  Laterality: Right;    SOCIAL HISTORY: Social History   Social History  . Marital status: Widowed    Spouse name: N/A  . Number of children: N/A  . Years of education: N/A   Occupational History  . Not on file.   Social  History Main Topics  . Smoking status: Never Smoker  . Smokeless tobacco: Never Used  . Alcohol use No  . Drug use: No  . Sexual activity: Yes   Other Topics Concern  . Not on file   Social History Narrative  . No narrative on file    FAMILY HISTORY: Family History  Problem Relation Age of Onset  . Stroke Father   . Breast cancer Sister        dx 418's died at 411 . Diabetes Brother   . Kidney disease Brother   . Heart disease Brother   . Epilepsy Son   . Colon cancer Other 558 . Uterine cancer Other 662 . Hodgkin's lymphoma Other     ALLERGIES:  is allergic to benicar [olmesartan medoxomil] and amoxicillin.  MEDICATIONS:  Current Outpatient Prescriptions  Medication Sig Dispense Refill  . acetaminophen (TYLENOL) 500 MG tablet Take 2 tablets (1,000 mg total) by mouth every 6 (six) hours as needed for moderate pain. 30 tablet 0  . Calcium Carbonate-Vitamin D (CALCIUM 600+D) 600-200 MG-UNIT TABS Take  1 tablet by mouth daily.  0  . capecitabine (XELODA) 500 MG tablet Take 3 tablets (1,500 mg total) by mouth 2 (two) times daily after a meal. Take for 14 days on, 7 days off, repeat every 21 days 84 tablet 2  . cholecalciferol (VITAMIN D) 1000 units tablet Take 1,000 Units by mouth daily.    . ferrous sulfate 325 (65 FE) MG tablet Take 1 tablet (325 mg total) by mouth 2 (two) times daily with a meal. 60 tablet 3  . Multiple Vitamins-Minerals (PRESERVISION AREDS 2 PO) Take 1 capsule by mouth 2 (two) times daily.     No current facility-administered medications for this visit.     REVIEW OF SYSTEMS:   Constitutional: Denies fevers, chills or abnormal night sweats Eyes: Denies blurriness of vision, double vision or watery eyes Ears, nose, mouth, throat, and face: Denies mucositis or sore throat Respiratory: Denies cough, dyspnea or wheezes Cardiovascular: Denies palpitation, chest discomfort or lower extremity swelling Gastrointestinal:  Denies nausea, heartburn or change in  bowel habits Skin: Denies abnormal skin rashes Lymphatics: Denies new lymphadenopathy or easy bruising Neurological:Denies numbness, tingling or new weaknesses Behavioral/Psych: Mood is stable, no new changes  All other systems were reviewed with the patient and are negative.  PHYSICAL EXAMINATION:  ECOG PERFORMANCE STATUS: 1 - Symptomatic but completely ambulatory  Vitals:   12/03/16 0825  BP: (!) 165/67  Pulse: 60  Resp: 20  Temp: 98.2 F (36.8 C)  SpO2: 98%   Filed Weights   12/03/16 0825  Weight: 114 lb 14.4 oz (52.1 kg)    GENERAL:alert, no distress and comfortable SKIN: skin color, texture, turgor are normal, no rashes or significant lesions EYES: normal, conjunctiva are pink and non-injected, sclera clear OROPHARYNX:no exudate, no erythema and lips, buccal mucosa, and tongue normal  NECK: supple, thyroid normal size, non-tender, without nodularity LYMPH:  no palpable lymphadenopathy in the cervical, axillary or inguinal LUNGS: clear to auscultation and percussion with normal breathing effort HEART: regular rate & rhythm and no murmurs and no lower extremity edema ABDOMEN:abdomen soft, non-tender and normal bowel sounds. Midline surgical incision has healed well. Musculoskeletal:no cyanosis of digits and no clubbing  PSYCH: alert & oriented x 3 with fluent speech NEURO: no focal motor/sensory deficits  LABORATORY DATA:  I have reviewed the data as listed CBC Latest Ref Rng & Units 12/03/2016 11/07/2016 10/16/2016  WBC 3.9 - 10.3 10e3/uL 5.3 7.8 7.6  Hemoglobin 11.6 - 15.9 g/dL 13.5 11.3(L) 9.1(L)  Hematocrit 34.8 - 46.6 % 42.4 37.3 30.5(L)  Platelets 145 - 400 10e3/uL 197 252 386   PATHOLOGY REPORTS:   10/14/16 Diagnosis Colon, segmental resection for tumor, Right ADENOCARCINOMA WITH EXTENSIVE EXTRA CELLULAR MUCIN, GRADE 1, (11.0 CM) THE TUMOR INVADES THROUGH THE CECUM WALL INTO ADJACENT TERMINAL ILEUM (PT4B) NINETEEN BENIGN LYMPH NODES (0/19) SUPPURATIVE  INFLAMMATION WITH FIBROSIS AND ADHESION TO ABDOMINAL WALL NO ADENOCARCINOMA IDENTIFIED Microscopic Comment COLON AND RECTUM (INCLUDING TRANS-ANAL RESECTION): Specimen: Right colon with terminal ileum Procedure: Segmental resection Tumor site: Cecum and terminal ileum Specimen integrity: Intact Macroscopic intactness of mesorectum: Not applicable: X Complete: NA Near complete: NA Incomplete: NA Cannot be determined (specify): NA Macroscopic tumor perforation: Invades adjacent structure Invasive tumor: Maximum size: 11.0 cm Histologic type(s): Adenocarcinoma with extracellular mucin Histologic grade and differentiation: G1: well differentiated/low grade G2: moderately differentiated/low grade G3: poorly differentiated/high grade G4: undifferentiated/high grade Type of polyp in which invasive carcinoma arose: Tubular adenoma Microscopic extension of invasive tumor: Invades adjacent terminal ileum 1  of 5 Supplemental copy SUPPLEMENTAL for PRANIKA, FINKS ([id number]) Microscopic Comment(continued) Lymph-Vascular invasion: Negative Peri-neural invasion: Negative Tumor deposit(s) (discontinuous extramural extension): Negative Resection margins: Proximal margin: Negative Distal margin: Negative Circumferential (radial) (posterior ascending, posterior descending; lateral and posterior mid-rectum; and entire lower 1/3 rectum):NA Mesenteric margin (sigmoid and transverse): NA Distance closest margin (if all above margins negative): 1.5 cm from abdominal wall adhesion resection Trans-anal resection margins only: Deep margin: NA Mucosal Margin: NA Distance closest mucosal margin (if negative): NA Treatment effect (neo-adjuvant therapy): Negative Additional polyp(s): Negative Non-neoplastic findings: Unremarkable Lymph nodes: number examined 19; number positive: 0 Pathologic Staging: pT4b, pN0, pMx Ancillary studies: Ordered  RADIOGRAPHIC STUDIES: I have personally reviewed the  radiological images as listed and agreed with the findings in the report.  Ct Chest Wo Contrast  Result Date: 11/19/2016 CLINICAL DATA:  Colon cancer. EXAM: CT CHEST WITHOUT CONTRAST TECHNIQUE: Multidetector CT imaging of the chest was performed following the standard protocol without IV contrast. COMPARISON:  CT abdomen pelvis 09/19/2016. FINDINGS: Cardiovascular: Atherosclerotic calcification of the arterial vasculature, including coronary arteries. Heart is at the upper limits of normal in size. No pericardial effusion. Mediastinum/Nodes: No pathologically enlarged mediastinal lymph nodes. Hilar regions are difficult to evaluate without IV contrast. No axillary adenopathy. Esophagus is grossly unremarkable. Lungs/Pleura: Scattered calcified granulomas. Clustered peribronchovascular nodularity in the right middle lobe is likely infectious or post infectious in etiology. Mild scattered scarring in the lower lobes. Lungs are otherwise clear. No pleural fluid. Airway is unremarkable. Upper Abdomen: Visualized portions of the liver and adrenal glands are unremarkable. Multiple low-attenuation lesions in the kidneys measure up to 3.0 cm, incompletely visualized and are too small to characterize. Lack of IV contrast is also limiting. Visualized portions of the spleen, pancreas, stomach and bowel are grossly unremarkable with exception of a small hiatal hernia. Cholecystectomy. No upper abdominal adenopathy. Musculoskeletal: Degenerative changes in the spine. No worrisome lytic or sclerotic lesions. IMPRESSION: 1. No evidence of metastatic disease. 2. Aortic atherosclerosis (ICD10-170.0). Coronary artery calcification. Electronically Signed   By: Lorin Picket M.D.   On: 11/19/2016 11:39     CT A/P 09/19/16 IMPRESSION: Large 9.5 cm intraluminal mass in the cecum and ascending colon, consistent with colon carcinoma. Tumor extension into adjacent pericolonic fat seen along the lateral wall.  Mild right  pericolonic and mesenteric lymphadenopathy, suspicious for metastatic disease. No other sites of metastatic disease identified.  Colonic diverticulosis, without radiographic evidence of diverticulitis. Tiny hiatal hernia, and small epigastric ventral hernia containing only fat.  Aortic atherosclerosis.  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 She was found to have right-sided colon cancer beginning in the cecum. She did have an impressively large at 11cm tumor which invaded around the adjacent small intestine which was removed without complication by Dr Georgette Dover. Her surgical margins were negative. All 19 lymph nodes were negative. -I reviewed her staging CT abd/pel scan, which was negative for metastatic disease. I'll get a CT chest to complete his staging. -She had stage II disease, early-stage but with high risk features, because of her T4b tumor  - I discussed her high recurrence risk due to T4b lesion, especially local recurrence  -Given her high risk of recurrence, I recommend her to consider adjuvant Xeloda. She is 24, but a very healthy and functional, she may be able tolerate Xeloda well. Due to her  advanced age, I recommend her take Xeloda for 3 months, instead of standard 6 months therapy.  -She has started adjuvant Xeloda on 11/25/16, tolerated her first cycle very well, we will continue  -Lab and follow-up before each new cycle of Xeloda -We discussed her 11/19/16 CT Chest showed no metastatic disease.  -After next cycle Xeloda I will see her every 3 weeks.  -Previous CEA with in normal limits and her CBC are adequate to continue with treatment  2.) Genetics -Due to her prevalent family history of cancer, I suggested that she should follow up with genetics for testing. She would like to have this performed.  -She does have two sons and one  daughter, I also informed her that if her genetic testing were to come back with abnormal results that her children should be tested as well.  -Also advised that her children should have routine colonoscopies.   3.) Anemia  -We will monitor her Hgb and Iron levels. If these continue to worsen or do not improve, we will consider infusion to aid with this.  -I encouraged her to continue taking her iron supplements at home.   -We will monitor this with her lab work.  -As of 12/03/16 her Hg is 13.5, iron study is still pending   4.) Borderline DM and HTN: -She was on BP medication prior to her weight loss, she was taken off of this following her weight loss.   -Continue with weight loss plan for management and I encouraged her to maintain a good diet to manage her sugars.  -She will continue f/u w/ her PCP for this.   PLAN:  -Continue Xeloda first cycle  -will proceed with Genetic Counseling today -Lab and f/u 10/8, before she starts cycle 2 Xeloda     No orders of the defined types were placed in this encounter.   All questions were answered. The patient knows to call the clinic with any problems, questions or concerns. I spent 15 minutes counseling the patient face to face. The total time spent in the appointment was 20 minutes and more than 50% was on counseling.  This document serves as a record of services personally performed by Truitt Merle, MD. It was created on her behalf by Joslyn Devon, a trained medical scribe. The creation of this record is based on the scribe's personal observations and the provider's statements to them. This document has been checked and approved by the attending provider.    Truitt Merle, MD 12/03/2016 9:05 PM

## 2016-12-03 ENCOUNTER — Ambulatory Visit (HOSPITAL_BASED_OUTPATIENT_CLINIC_OR_DEPARTMENT_OTHER): Payer: Medicare Other | Admitting: Hematology

## 2016-12-03 ENCOUNTER — Encounter: Payer: Self-pay | Admitting: Hematology

## 2016-12-03 ENCOUNTER — Other Ambulatory Visit (HOSPITAL_BASED_OUTPATIENT_CLINIC_OR_DEPARTMENT_OTHER): Payer: Medicare Other

## 2016-12-03 ENCOUNTER — Encounter: Payer: Self-pay | Admitting: Genetics

## 2016-12-03 ENCOUNTER — Ambulatory Visit (HOSPITAL_BASED_OUTPATIENT_CLINIC_OR_DEPARTMENT_OTHER): Payer: Medicare Other | Admitting: Genetics

## 2016-12-03 ENCOUNTER — Telehealth: Payer: Self-pay | Admitting: Hematology

## 2016-12-03 VITALS — BP 165/67 | HR 60 | Temp 98.2°F | Resp 20 | Ht <= 58 in | Wt 114.9 lb

## 2016-12-03 DIAGNOSIS — Z7183 Encounter for nonprocreative genetic counseling: Secondary | ICD-10-CM

## 2016-12-03 DIAGNOSIS — Z8049 Family history of malignant neoplasm of other genital organs: Secondary | ICD-10-CM | POA: Insufficient documentation

## 2016-12-03 DIAGNOSIS — D500 Iron deficiency anemia secondary to blood loss (chronic): Secondary | ICD-10-CM

## 2016-12-03 DIAGNOSIS — D649 Anemia, unspecified: Secondary | ICD-10-CM | POA: Diagnosis not present

## 2016-12-03 DIAGNOSIS — C182 Malignant neoplasm of ascending colon: Secondary | ICD-10-CM | POA: Diagnosis not present

## 2016-12-03 DIAGNOSIS — I10 Essential (primary) hypertension: Secondary | ICD-10-CM | POA: Diagnosis not present

## 2016-12-03 DIAGNOSIS — E119 Type 2 diabetes mellitus without complications: Secondary | ICD-10-CM

## 2016-12-03 DIAGNOSIS — Z800 Family history of malignant neoplasm of digestive organs: Secondary | ICD-10-CM

## 2016-12-03 DIAGNOSIS — Z803 Family history of malignant neoplasm of breast: Secondary | ICD-10-CM

## 2016-12-03 DIAGNOSIS — Z85038 Personal history of other malignant neoplasm of large intestine: Secondary | ICD-10-CM | POA: Insufficient documentation

## 2016-12-03 LAB — CBC & DIFF AND RETIC
BASO%: 1.3 % (ref 0.0–2.0)
BASOS ABS: 0.1 10*3/uL (ref 0.0–0.1)
EOS ABS: 0.1 10*3/uL (ref 0.0–0.5)
EOS%: 2.5 % (ref 0.0–7.0)
HEMATOCRIT: 42.4 % (ref 34.8–46.6)
HEMOGLOBIN: 13.5 g/dL (ref 11.6–15.9)
Immature Retic Fract: 1.3 % — ABNORMAL LOW (ref 1.60–10.00)
LYMPH#: 1.6 10*3/uL (ref 0.9–3.3)
LYMPH%: 29.6 % (ref 14.0–49.7)
MCH: 26.9 pg (ref 25.1–34.0)
MCHC: 31.8 g/dL (ref 31.5–36.0)
MCV: 84.6 fL (ref 79.5–101.0)
MONO#: 0.3 10*3/uL (ref 0.1–0.9)
MONO%: 6.2 % (ref 0.0–14.0)
NEUT#: 3.2 10*3/uL (ref 1.5–6.5)
NEUT%: 60.4 % (ref 38.4–76.8)
Platelets: 197 10*3/uL (ref 145–400)
RBC: 5.01 10*6/uL (ref 3.70–5.45)
RDW: 20.8 % — AB (ref 11.2–14.5)
RETIC %: 0.53 % — AB (ref 0.70–2.10)
RETIC CT ABS: 26.55 10*3/uL — AB (ref 33.70–90.70)
WBC: 5.3 10*3/uL (ref 3.9–10.3)

## 2016-12-03 LAB — COMPREHENSIVE METABOLIC PANEL
ALT: 10 U/L (ref 0–55)
ANION GAP: 9 meq/L (ref 3–11)
AST: 18 U/L (ref 5–34)
Albumin: 3.6 g/dL (ref 3.5–5.0)
Alkaline Phosphatase: 70 U/L (ref 40–150)
BUN: 12.2 mg/dL (ref 7.0–26.0)
CHLORIDE: 108 meq/L (ref 98–109)
CO2: 25 meq/L (ref 22–29)
CREATININE: 0.7 mg/dL (ref 0.6–1.1)
Calcium: 9.6 mg/dL (ref 8.4–10.4)
EGFR: 80 mL/min/{1.73_m2} — ABNORMAL LOW (ref >90)
Glucose: 87 mg/dl (ref 70–140)
Potassium: 4.1 mEq/L (ref 3.5–5.1)
Sodium: 142 mEq/L (ref 136–145)
Total Bilirubin: 0.68 mg/dL (ref 0.20–1.20)
Total Protein: 6.7 g/dL (ref 6.4–8.3)

## 2016-12-03 LAB — FERRITIN: Ferritin: 61 ng/ml (ref 9–269)

## 2016-12-03 NOTE — Telephone Encounter (Signed)
Scheduled appt per 9/26 los - Gave patient AVS and calender per los.  

## 2016-12-03 NOTE — Progress Notes (Signed)
REFERRING PROVIDER: Truitt Merle, MD [address] [place], [place] [address]  PRIMARY PROVIDER:  Dettinger, [name], MD  PRIMARY REASON FOR VISIT:  1. Cancer of right colon ([place])   2. Family history of colon cancer   3. Family history of uterine cancer   4. Family history of breast cancer      HISTORY OF PRESENT ILLNESS:   Ashley Peters, a 83 y.o. female, was seen for a [facility] cancer genetics consultation at the request of Dr. Burr Medico due to a personal and family history of cancer.  Ashley Peters presents to clinic today to discuss the possibility of a hereditary predisposition to cancer, genetic testing, and to further clarify her future cancer risks, as well as potential cancer risks for family members.   In July 2018, at the age of 75, Ashley Peters was diagnosed with colon carcinoma. This was treated with an open right hemicolectomy and epigastric ventral hernia repair on 10/14/2016. She is having adjuvant chemotherapy.    CANCER HISTORY:    Cancer of right colon ([place])   09/19/2016 Imaging    CT A/P 09/19/16 IMPRESSION: Large 9.5 cm intraluminal mass in the cecum and ascending colon, consistent with colon carcinoma. Tumor extension into adjacent pericolonic fat seen along the lateral wall.  Mild right pericolonic and mesenteric lymphadenopathy, suspicious for metastatic disease. No other sites of metastatic disease identified.  Colonic diverticulosis, without radiographic evidence of diverticulitis. Tiny hiatal hernia, and small epigastric ventral hernia containing only fat.  Aortic atherosclerosis.      10/14/2016 Initial Diagnosis    Cancer of right colon ([place])     10/14/2016 Surgery    OPEN RIGHT HEMICOLECTOMY and REPAIR OF EPIGASTRIC VENTRAL HERNIA by Dr. Georgette Dover and Dr. Dalbert Batman       10/14/2016 Pathology Results    10/14/16 Diagnosis Colon, segmental resection for tumor, Right ADENOCARCINOMA WITH EXTENSIVE EXTRA CELLULAR MUCIN, GRADE 1, (11.0 CM) THE TUMOR INVADES  THROUGH THE CECUM WALL INTO ADJACENT TERMINAL ILEUM (PT4B) NINETEEN BENIGN LYMPH NODES (0/19) SUPPURATIVE INFLAMMATION WITH FIBROSIS AND ADHESION TO ABDOMINAL WALL NO ADENOCARCINOMA IDENTIFIED      11/19/2016 Imaging    CT Chest WO Contrast 11/19/16 IMPRESSION: 1. No evidence of metastatic disease. 2. Aortic atherosclerosis (ICD10-170.0). Coronary artery calcification.      11/25/2016 -  Chemotherapy    Adjuvant Xeloda two weeks on, one week off starting 11/25/16 for 3 months          HORMONAL RISK FACTORS:  Ovaries intact: yes.  Hysterectomy: yes.  Menopausal status: postmenopausal.  Colonoscopy: Reports that previous colonoscopies (throughout her life) did not identify a high number of colon polyps.   Past Medical History:  Diagnosis Date  . Anemia   . Cancer ([facility] [place] [facility])   . Colon cancer ([facility])   . Diverticulosis   . Family history of breast cancer   . Family history of colon cancer   . Family history of uterine cancer   . GERD (gastroesophageal reflux disease)   . History of kidney stones   . Hyperlipidemia   . Hypertension   . Macular degeneration   . Osteopenia     Past Surgical History:  Procedure Laterality Date  . ABDOMINAL HYSTERECTOMY    . ablation tr Great saphenous vein  10/17/2009  . brest biopsy  1971  . CHOLECYSTECTOMY    . EPIGASTRIC HERNIA REPAIR N/A 10/14/2016   Procedure: REPAIR OF EPIGASTRIC VENTRAL HERNIA;  Surgeon: Donnie Mesa, MD;  Location: [facility];  Service: General;  Laterality: N/A;  . lt. distal ureteral stone extraction  09/14/1991  . PARTIAL COLECTOMY Right 10/14/2016   Procedure: OPEN RIGHT HEMICOLECTOMY;  Surgeon: Donnie Mesa, MD;  Location: [facility];  Service: General;  Laterality: Right;    Social History   Social History  . Marital status: Widowed    Spouse name: N/A  . Number of children: N/A  . Years of education: N/A   Social History Main Topics  . Smoking status: Never Smoker  . Smokeless tobacco: Never Used  . Alcohol use  No  . Drug use: No  . Sexual activity: Yes   Other Topics Concern  . None   Social History Narrative  . None     FAMILY HISTORY:  We obtained a detailed, 4-generation family history.  Significant diagnoses are listed below: Family History  Problem Relation Age of Onset  . Stroke Father   . Breast cancer Sister        dx 35's, died at 79  . Diabetes Brother   . Kidney disease Brother   . Heart disease Brother   . Epilepsy Son   . Colon cancer Other 64  . Uterine cancer Other 42  . Hodgkin's lymphoma Other    Ashley Peters has a 66 year-old daughter and 2 sons (ages 46 and 62).  She does not have any biological grandchildren.  Ashley Peters has 2 sisters and 2 brothers listed below: -1 sister was diagnosed with breast cancer in her 70's and died at 79.  This sister has a son and a daughter in their 50's with no history of cancer.   -1 sister is 6 with no history of cancer.  This sister has a son and a daughter.  The daughter (patient's niece) is 58 and has recently been diagnosed with uterine cancer.  This niece also has a history of Hodgkins lymphoma.   -1 is brother is 13 with no history of cancer. This brother has 2 daughters and a son.  The son was diagnosed with colon cancer in his late 42's.   -1 brother died at 34 without any history of cancer.  He had no children.   Ashley Peters father died at 24 with no history of cancer.  Ashley Peters has a paternal uncle who died in his 50's/80's with no history of cancer.  He had a son and a daughter with no known history of cancer. Ashley Peters paternal grandfather and grandmother both died at older ages (93's/80's) but she does not know the cause- no cancer history that she is aware of.    Ashley Peters mother died in her 21's with no history of caner.  Ashley Peters has 2 maternal aunts and 3 maternal uncles.  All of these aunts and uncles died older than 82 and had no history of cancer.  Ashley Peters has several maternal cousins, none with any  known history of cancer. Ashley Peters maternal grandmother and grandfather did not have any history of cancer and died from 36old age'.   Ashley Peters is unaware of previous family history of genetic testing for hereditary cancer risks. Patient's maternal ancestors are of Caucasian descent, and paternal ancestors are of Caucasian descent. There is no reported Ashkenazi Jewish ancestry. There is no known consanguinity.  GENETIC COUNSELING ASSESSMENT: SHAMICKA INGA is a 83 y.o. female with a personal and family history which is somewhat suggestive of a Hereditary Cancer Predisposition Syndrome. We, therefore, discussed and recommended the following at today's visit.  DISCUSSION: We reviewed the characteristics, features and inheritance patterns of hereditary cancer syndromes. We also discussed genetic testing, including the appropriate family members to test, the process of testing, insurance coverage and turn-around-time for results. We discussed the implications of a negative, positive and/or variant of uncertain significant result. We recommended Ms. Speelman pursue genetic testing for the Common Hereditary Cancer gene panel. The Hereditary Gene Panel offered by [facility] includes sequencing and/or deletion duplication testing of the following 46 genes: APC, ATM, AXIN2, BARD1, BMPR1A, BRCA1, BRCA2, BRIP1, CDH1, CDKN2A (p14ARF), CDKN2A (p16INK4a), CHEK2, CTNNA1, DICER1, EPCAM (Deletion/duplication testing only), GREM1 (promoter region deletion/duplication testing only), KIT, MEN1, MLH1, MSH2, MSH3, MSH6, MUTYH, NBN, NF1, NHTL1, PALB2, PDGFRA, PMS2, POLD1, POLE, PTEN, RAD50, RAD51C, RAD51D, SDHB, SDHC, SDHD, SMAD4, SMARCA4. STK11, TP53, TSC1, TSC2, and VHL.  The following genes were evaluated for sequence changes only: SDHA and HOXB13 c.251G>A variant only.  We discussed that only 5-10% of cancers are associated with a Hereditary Cancer Predisposition Syndrome.  The most common hereditary cancer syndrome  associated with colon cancer is Lynch Syndrome.  Lynch Syndrome is caused by mutations in the genes: MLH1, MSH2, MSH6, PMS2 and EPCAM.  This syndrome increases the risk for colon, uterine, ovarian and stomach cancers, as well as others.  Families with Lynch Syndrome tend to have multiple family members with these cancers, typically diagnosed under age 27, and diagnoses in multiple generations.    We discussed that there are several other genes that are associated with an increased risk for colon cancer and increased polyp burden (MUTYH, APC, POLE, CHEK2, etc.) We also dicussed that there are many genes that cause many different types of cancer risks.    Ms. Lacap had intact Immuno-histo chemistry (IHC) performed on her tumor on 10/22/2016.  These proteins were intact, so the suspicion for Lynch Syndrome is low.  However, there are other genes and syndromes associated with an increased risk for colon and other types of cancer.   Testing on Ms. Moster's tumor also revealed it was MSI stable, also lowering suspicion for Lynch Syndrome.   We discussed that if she is found to have a mutation in one of these genes, it may impact future medical management recommendations such as increased cancer screenings and consideration of risk reducing surgeries.  A positive result could also have implications for the patient's family members.  A Negative result would mean we were unable to identify a hereditary component to her cancer, but does not rule out the possibility of a hereditary basis for her cancer.  There could be mutations that are undetectable by current technology, or in genes not yet tested or identified to increase cancer risk.    We discussed the potential to find a Variant of Uncertain Significance or VUS.  These are variants that have not yet been identified as pathogenic or benign, and it is unknown if this variant is associated with increased cancer risk or if this is a normal finding.  Most VUS's  are reclassified to benign or likely benign.   It should not be used to make medical management decisions. With time, we suspect the lab will determine the significance of any VUS's identified if any.   Based on Ms. Gieselman's personal and family history of cancer, she meets medical criteria for genetic testing. Despite that she meets criteria, she may still have an out of pocket cost. We discussed that if her out of pocket cost for testing is over $100, the laboratory will call and confirm  whether she wants to proceed with testing.  If the out of pocket cost of testing is less than $100 she will be billed by the genetic testing laboratory. In our experience, patients with medicare insurance do not typically have any out of pocket cost for genetic testing through [facility].    PLAN: After considering the risks, benefits, and limitations, Ms. Wendt  provided informed consent to pursue genetic testing.  She has a blood draw scheduled on Oct 8th at 2:15 and requested we draw blood for the genetic testing at that appointment.  After a blood sample is obtained on Oct. 8 2018, her sample will be sent to [facility] for analysis of the Common Hereditary Cancer Panel. Results should then be available within approximately 2-3 weeks' time, at which point they will be disclosed by telephone to Ms. Plamondon, as will any additional recommendations warranted by these results. Ms. Esqueda will receive a summary of her genetic counseling visit and a copy of her results once available. This information will also be available in Epic. We encouraged Ms. Alvizo to remain in contact with cancer genetics annually so that we can continuously update the family history and inform her of any changes in cancer genetics and testing that may be of benefit for her family. Ms. Dewalt questions were answered to her satisfaction today. Our contact information was provided should additional questions or concerns arise.  Based  on Ms. Zaffino's family history, we recommended her siblings/nieces/nephews consider genetic testing due Ms. Mcshan's sister being dagnosed with breast cancer in her 47's.  If this cancer diagnosis was 76 years or younger, they would meed medical criteria for genetic testing. Ms. Marshman will let us know if we can be of any assistance in coordinating genetic counseling and/or testing for these family member.   Lastly, we encouraged Ms. Cotrell to remain in contact with cancer genetics annually so that we can continuously update the family history and inform her of any changes in cancer genetics and testing that may be of benefit for this family.   Ms.  Six questions were answered to her satisfaction today. Our contact information was provided should additional questions or concerns arise. Thank you for the referral and allowing [place] to share in the care of your patient.   Tana Felts, MS Genetic Counselor [email or web address] phone: [phone]  The patient was seen for a total of 30 minutes in face-to-face genetic counseling. The patient was accompanied today by her daughter.

## 2016-12-15 ENCOUNTER — Encounter: Payer: Self-pay | Admitting: Hematology

## 2016-12-15 ENCOUNTER — Other Ambulatory Visit (HOSPITAL_BASED_OUTPATIENT_CLINIC_OR_DEPARTMENT_OTHER): Payer: Medicare Other

## 2016-12-15 ENCOUNTER — Telehealth: Payer: Self-pay

## 2016-12-15 ENCOUNTER — Ambulatory Visit (HOSPITAL_BASED_OUTPATIENT_CLINIC_OR_DEPARTMENT_OTHER): Payer: Medicare Other | Admitting: Hematology

## 2016-12-15 VITALS — BP 151/56 | HR 75 | Temp 98.7°F | Resp 18 | Ht <= 58 in | Wt 114.3 lb

## 2016-12-15 DIAGNOSIS — Z803 Family history of malignant neoplasm of breast: Secondary | ICD-10-CM | POA: Diagnosis not present

## 2016-12-15 DIAGNOSIS — I10 Essential (primary) hypertension: Secondary | ICD-10-CM | POA: Diagnosis not present

## 2016-12-15 DIAGNOSIS — Z23 Encounter for immunization: Secondary | ICD-10-CM | POA: Diagnosis not present

## 2016-12-15 DIAGNOSIS — E119 Type 2 diabetes mellitus without complications: Secondary | ICD-10-CM | POA: Diagnosis not present

## 2016-12-15 DIAGNOSIS — D500 Iron deficiency anemia secondary to blood loss (chronic): Secondary | ICD-10-CM

## 2016-12-15 DIAGNOSIS — C182 Malignant neoplasm of ascending colon: Secondary | ICD-10-CM | POA: Diagnosis not present

## 2016-12-15 DIAGNOSIS — Z800 Family history of malignant neoplasm of digestive organs: Secondary | ICD-10-CM | POA: Diagnosis not present

## 2016-12-15 DIAGNOSIS — D649 Anemia, unspecified: Secondary | ICD-10-CM

## 2016-12-15 DIAGNOSIS — Z8049 Family history of malignant neoplasm of other genital organs: Secondary | ICD-10-CM | POA: Diagnosis not present

## 2016-12-15 LAB — CBC & DIFF AND RETIC
BASO%: 1.1 % (ref 0.0–2.0)
BASOS ABS: 0.1 10*3/uL (ref 0.0–0.1)
EOS ABS: 0.1 10*3/uL (ref 0.0–0.5)
EOS%: 2 % (ref 0.0–7.0)
HEMATOCRIT: 41.5 % (ref 34.8–46.6)
HEMOGLOBIN: 13.5 g/dL (ref 11.6–15.9)
IMMATURE RETIC FRACT: 1.2 % — AB (ref 1.60–10.00)
LYMPH%: 25.5 % (ref 14.0–49.7)
MCH: 27.6 pg (ref 25.1–34.0)
MCHC: 32.6 g/dL (ref 31.5–36.0)
MCV: 84.5 fL (ref 79.5–101.0)
MONO#: 0.5 10*3/uL (ref 0.1–0.9)
MONO%: 6.8 % (ref 0.0–14.0)
NEUT%: 64.6 % (ref 38.4–76.8)
NEUTROS ABS: 4.6 10*3/uL (ref 1.5–6.5)
Platelets: 217 10*3/uL (ref 145–400)
RBC: 4.91 10*6/uL (ref 3.70–5.45)
RDW: 22.7 % — AB (ref 11.2–14.5)
RETIC %: 0.61 % — AB (ref 0.70–2.10)
RETIC CT ABS: 29.95 10*3/uL — AB (ref 33.70–90.70)
WBC: 7.1 10*3/uL (ref 3.9–10.3)
lymph#: 1.8 10*3/uL (ref 0.9–3.3)

## 2016-12-15 LAB — COMPREHENSIVE METABOLIC PANEL
ALT: 11 U/L (ref 0–55)
ANION GAP: 8 meq/L (ref 3–11)
AST: 14 U/L (ref 5–34)
Albumin: 3.7 g/dL (ref 3.5–5.0)
Alkaline Phosphatase: 70 U/L (ref 40–150)
BUN: 17.3 mg/dL (ref 7.0–26.0)
CALCIUM: 10.3 mg/dL (ref 8.4–10.4)
CHLORIDE: 108 meq/L (ref 98–109)
CO2: 27 meq/L (ref 22–29)
Creatinine: 0.7 mg/dL (ref 0.6–1.1)
EGFR: 79 mL/min/{1.73_m2} — ABNORMAL LOW (ref >90)
Glucose: 89 mg/dl (ref 70–140)
POTASSIUM: 4.6 meq/L (ref 3.5–5.1)
Sodium: 143 mEq/L (ref 136–145)
Total Bilirubin: 0.61 mg/dL (ref 0.20–1.20)
Total Protein: 7 g/dL (ref 6.4–8.3)

## 2016-12-15 MED ORDER — INFLUENZA VAC SPLIT HIGH-DOSE 0.5 ML IM SUSY
0.5000 mL | PREFILLED_SYRINGE | Freq: Once | INTRAMUSCULAR | Status: AC
  Administered 2016-12-15: 0.5 mL via INTRAMUSCULAR
  Filled 2016-12-15: qty 0.5

## 2016-12-15 NOTE — Progress Notes (Signed)
[place]  [phone]) [phone] [phone]  Clinic Follow up Note   Patient Care Team: Dettinger, [name], MD as PCP - General (Family Medicine) Clent Jacks, MD as Consulting Physician (Ophthalmology) Zadie Rhine Clent Demark, MD as Consulting Physician (Ophthalmology) Donnie Mesa, MD as Consulting Physician (General Surgery) Truitt Merle, MD as Consulting Physician (Hematology) 12/15/2016   CHIEF COMPLAINTS:  Follow up cancer of the right colon     Cancer of right colon ([place])   09/19/2016 Imaging    CT A/P 09/19/16 IMPRESSION: Large 9.5 cm intraluminal mass in the cecum and ascending colon, consistent with colon carcinoma. Tumor extension into adjacent pericolonic fat seen along the lateral wall.  Mild right pericolonic and mesenteric lymphadenopathy, suspicious for metastatic disease. No other sites of metastatic disease identified.  Colonic diverticulosis, without radiographic evidence of diverticulitis. Tiny hiatal hernia, and small epigastric ventral hernia containing only fat.  Aortic atherosclerosis.      10/14/2016 Initial Diagnosis    Cancer of right colon ([place])     10/14/2016 Surgery    OPEN RIGHT HEMICOLECTOMY and REPAIR OF EPIGASTRIC VENTRAL HERNIA by Dr. Georgette Dover and Dr. Dalbert Batman       10/14/2016 Pathology Results    10/14/16 Diagnosis Colon, segmental resection for tumor, Right ADENOCARCINOMA WITH EXTENSIVE EXTRA CELLULAR MUCIN, GRADE 1, (11.0 CM) THE TUMOR INVADES THROUGH THE CECUM WALL INTO ADJACENT TERMINAL ILEUM (PT4B) NINETEEN BENIGN LYMPH NODES (0/19) SUPPURATIVE INFLAMMATION WITH FIBROSIS AND ADHESION TO ABDOMINAL WALL NO ADENOCARCINOMA IDENTIFIED      11/19/2016 Imaging    CT Chest WO Contrast 11/19/16 IMPRESSION: 1. No evidence of metastatic disease. 2. Aortic atherosclerosis (ICD10-170.0). Coronary artery calcification.      11/25/2016 -  Chemotherapy    Adjuvant Xeloda two weeks on, one week off starting 11/25/16 for  3 months          HISTORY OF PRESENTING ILLNESS:  Ashley Peters 83 y.o. female is here because of cancer of the right colon. She presents with her daughter and husband. Initially, pt presented to her PCP's office on 09/19/16 for a 39mocheck-up and with complaints of worsening right lower quadrant pain that had been ongoing for several weeks. This was accompanied by fatigue, chills, and weight loss of approximately 30lbs over the past year. However, she reports that she was enrolled in a nutrition program for diabetics to help her lose weight (she is only borderline diabetic). Her daughter was able to notice her fatigue as well. She had not noticed any bloody stools, however, she did notice some melena but was on iron supplements for her anemia. She denies any significant constipation with this. She had also noticed a small area of distension over her area of pain in the right lower abdomen which she had not noticed previously. CT A/P was performed the same day which was remarkable for a large 9.5cm intraluminal mass in the cecum and ascending colon, consistent with colon carcinoma. There was tumor extension into the adjacent pericolonic fat seen along the lateral abdominal wall as well. Additionally, mild right pericolonic and mesenteric lymphadenopathy was also seen which was suspicious for metastatic disease. An epigastric ventral hernia was also found to be present on this scan.   Following these findings, she was subsequently referred to Dr TGeorgette Doverwho performed both an open right hemicolectomy and epigastric ventral hernia repair on 10/14/16. She tolerated this procedure well and was monitored in the hospital until her discharge on 10/17/16, during which she recovered well. She was  referred to medical oncology following her surgery and she has been recovering well since d/c. Since her surgery she denies any pain returning to the area. Her bowel movements have been normal and regular since her surgery  and her appetite has been well. Her fatigue has also been improving and back to normal.   She currently lives alone in [place], [place] and very independent and active.    CURRENT THERAPY: Adjuvant Xeloda 1553m q12h,  two weeks on, one week off starting 11/25/16 for 3 months    INTERVAL HISTORY:  JREMY DIAis here for a follow up. She presents to the clinic today with her daughter. She notes tolerating her first cycle chemo going well. She denies any change in her appetite, significant side effects. Overall she is doing well. She opted in for her flu shot today.      MEDICAL HISTORY:  Past Medical History:  Diagnosis Date  . Anemia   . Cancer ([facility]   . Colon cancer ([facility]   . Diverticulosis   . Family history of breast cancer   . Family history of colon cancer   . Family history of uterine cancer   . GERD (gastroesophageal reflux disease)   . History of kidney stones   . Hyperlipidemia   . Hypertension   . Macular degeneration   . Osteopenia     SURGICAL HISTORY: Past Surgical History:  Procedure Laterality Date  . ABDOMINAL HYSTERECTOMY    . ablation tr Great saphenous vein  10/17/2009  . brest biopsy  1971  . CHOLECYSTECTOMY    . EPIGASTRIC HERNIA REPAIR N/A 10/14/2016   Procedure: REPAIR OF EPIGASTRIC VENTRAL HERNIA;  Surgeon: TDonnie Mesa MD;  [facility]  Service: General;  Laterality: N/A;  . lt. distal ureteral stone extraction  09/14/1991  . PARTIAL COLECTOMY Right 10/14/2016   Procedure: OPEN RIGHT HEMICOLECTOMY;  Surgeon: TDonnie Mesa MD;  [facility]  Service: General;  Laterality: Right;    SOCIAL HISTORY: Social History   Social History  . Marital status: Widowed    Spouse name: N/A  . Number of children: N/A  . Years of education: N/A   Occupational History  . Not on file.   Social History Main Topics  . Smoking status: Never Smoker  . Smokeless tobacco: Never Used  . Alcohol use No  . Drug use: No  . Sexual activity: Yes    Other Topics Concern  . Not on file   Social History Narrative  . No narrative on file    FAMILY HISTORY: Family History  Problem Relation Age of Onset  . Stroke Father   . Breast cancer Sister        dx 435's died at 453 . Diabetes Brother   . Kidney disease Brother   . Heart disease Brother   . Epilepsy Son   . Colon cancer Other 52 . Uterine cancer Other 645 . Hodgkin's lymphoma Other     ALLERGIES:  is allergic to benicar [olmesartan medoxomil] and amoxicillin.  MEDICATIONS:  Current Outpatient Prescriptions  Medication Sig Dispense Refill  . acetaminophen (TYLENOL) 500 MG tablet Take 2 tablets (1,000 mg total) by mouth every 6 (six) hours as needed for moderate pain. 30 tablet 0  . capecitabine (XELODA) 500 MG tablet Take 3 tablets (1,500 mg total) by mouth 2 (two) times daily after a meal. Take for 14 days on, 7 days off, repeat every 21 days 84 tablet 2  .  cholecalciferol (VITAMIN D) 1000 units tablet Take 1,000 Units by mouth daily.    . ferrous sulfate 325 (65 FE) MG tablet Take 1 tablet (325 mg total) by mouth 2 (two) times daily with a meal. 60 tablet 3  . Multiple Vitamins-Minerals (PRESERVISION AREDS 2 PO) Take 1 capsule by mouth 2 (two) times daily.    . Calcium Carbonate-Vitamin D (CALCIUM 600+D) 600-200 MG-UNIT TABS Take 1 tablet by mouth daily. (Patient not taking: Reported on 12/15/2016)  0   No current facility-administered medications for this visit.     REVIEW OF SYSTEMS:   Constitutional: Denies fevers, chills or abnormal night sweats Eyes: Denies blurriness of vision, double vision or watery eyes Ears, nose, mouth, throat, and face: Denies mucositis or sore throat Respiratory: Denies cough, dyspnea or wheezes Cardiovascular: Denies palpitation, chest discomfort or lower extremity swelling Gastrointestinal:  Denies nausea, heartburn or change in bowel habits Skin: Denies abnormal skin rashes Lymphatics: Denies new lymphadenopathy or easy  bruising Neurological:Denies numbness, tingling or new weaknesses Behavioral/Psych: Mood is stable, no new changes  All other systems were reviewed with the patient and are negative.  PHYSICAL EXAMINATION:  ECOG PERFORMANCE STATUS: 1 - Symptomatic but completely ambulatory  Vitals:   12/15/16 1448  BP: (!) 151/56  Pulse: 75  Resp: 18  Temp: 98.7 F (37.1 C)  SpO2: 100%   Filed Weights   12/15/16 1448  Weight: 114 lb 4.8 oz (51.8 kg)    GENERAL:alert, no distress and comfortable SKIN: skin color, texture, turgor are normal, no rashes or significant lesions EYES: normal, conjunctiva are pink and non-injected, sclera clear OROPHARYNX:no exudate, no erythema and lips, buccal mucosa, and tongue normal  NECK: supple, thyroid normal size, non-tender, without nodularity LYMPH:  no palpable lymphadenopathy in the cervical, axillary or inguinal LUNGS: clear to auscultation and percussion with normal breathing effort HEART: regular rate & rhythm and no murmurs and no lower extremity edema ABDOMEN:abdomen soft, non-tender and normal bowel sounds. Midline surgical incision has healed well. Musculoskeletal:no cyanosis of digits and no clubbing  PSYCH: alert & oriented x 3 with fluent speech NEURO: no focal motor/sensory deficits  LABORATORY DATA:  I have reviewed the data as listed CBC Latest Ref Rng & Units 12/15/2016 12/03/2016 11/07/2016  WBC 3.9 - 10.3 10e3/uL 7.1 5.3 7.8  Hemoglobin 11.6 - 15.9 g/dL 13.5 13.5 11.3(L)  Hematocrit 34.8 - 46.6 % 41.5 42.4 37.3  Platelets 145 - 400 10e3/uL 217 197 252   PATHOLOGY REPORTS:   10/14/16 Diagnosis Colon, segmental resection for tumor, Right ADENOCARCINOMA WITH EXTENSIVE EXTRA CELLULAR MUCIN, GRADE 1, (11.0 CM) THE TUMOR INVADES THROUGH THE CECUM WALL INTO ADJACENT TERMINAL ILEUM (PT4B) NINETEEN BENIGN LYMPH NODES (0/19) SUPPURATIVE INFLAMMATION WITH FIBROSIS AND ADHESION TO ABDOMINAL WALL NO ADENOCARCINOMA IDENTIFIED Microscopic  Comment COLON AND RECTUM (INCLUDING TRANS-ANAL RESECTION): Specimen: Right colon with terminal ileum Procedure: Segmental resection Tumor site: Cecum and terminal ileum Specimen integrity: Intact Macroscopic intactness of mesorectum: Not applicable: X Complete: NA Near complete: NA Incomplete: NA Cannot be determined (specify): NA Macroscopic tumor perforation: Invades adjacent structure Invasive tumor: Maximum size: 11.0 cm Histologic type(s): Adenocarcinoma with extracellular mucin Histologic grade and differentiation: G1: well differentiated/low grade G2: moderately differentiated/low grade G3: poorly differentiated/high grade G4: undifferentiated/high grade Type of polyp in which invasive carcinoma arose: Tubular adenoma Microscopic extension of invasive tumor: Invades adjacent terminal ileum 1 of 5 Supplemental copy SUPPLEMENTAL for Ashley Peters, Ashley Peters ([id number]) Microscopic Comment(continued) Lymph-Vascular invasion: Negative Peri-neural invasion: Negative Tumor deposit(s) (discontinuous extramural  extension): Negative Resection margins: Proximal margin: Negative Distal margin: Negative Circumferential (radial) (posterior ascending, posterior descending; lateral and posterior mid-rectum; and entire lower 1/3 rectum):NA Mesenteric margin (sigmoid and transverse): NA Distance closest margin (if all above margins negative): 1.5 cm from abdominal wall adhesion resection Trans-anal resection margins only: Deep margin: NA Mucosal Margin: NA Distance closest mucosal margin (if negative): NA Treatment effect (neo-adjuvant therapy): Negative Additional polyp(s): Negative Non-neoplastic findings: Unremarkable Lymph nodes: number examined 19; number positive: 0 Pathologic Staging: pT4b, pN0, pMx Ancillary studies: Ordered  RADIOGRAPHIC STUDIES: I have personally reviewed the radiological images as listed and agreed with the findings in the report.  Ct Chest Wo  Contrast  Result Date: 11/19/2016 CLINICAL DATA:  Colon cancer. EXAM: CT CHEST WITHOUT CONTRAST TECHNIQUE: Multidetector CT imaging of the chest was performed following the standard protocol without IV contrast. COMPARISON:  CT abdomen pelvis 09/19/2016. FINDINGS: Cardiovascular: Atherosclerotic calcification of the arterial vasculature, including coronary arteries. Heart is at the upper limits of normal in size. No pericardial effusion. Mediastinum/Nodes: No pathologically enlarged mediastinal lymph nodes. Hilar regions are difficult to evaluate without IV contrast. No axillary adenopathy. Esophagus is grossly unremarkable. Lungs/Pleura: Scattered calcified granulomas. Clustered peribronchovascular nodularity in the right middle lobe is likely infectious or post infectious in etiology. Mild scattered scarring in the lower lobes. Lungs are otherwise clear. No pleural fluid. Airway is unremarkable. Upper Abdomen: Visualized portions of the liver and adrenal glands are unremarkable. Multiple low-attenuation lesions in the kidneys measure up to 3.0 cm, incompletely visualized and are too small to characterize. Lack of IV contrast is also limiting. Visualized portions of the spleen, pancreas, stomach and bowel are grossly unremarkable with exception of a small hiatal hernia. Cholecystectomy. No upper abdominal adenopathy. Musculoskeletal: Degenerative changes in the spine. No worrisome lytic or sclerotic lesions. IMPRESSION: 1. No evidence of metastatic disease. 2. Aortic atherosclerosis (ICD10-170.0). Coronary artery calcification. Electronically Signed   By: Lorin Picket M.D.   On: 11/19/2016 11:39     CT A/P 09/19/16 IMPRESSION: Large 9.5 cm intraluminal mass in the cecum and ascending colon, consistent with colon carcinoma. Tumor extension into adjacent pericolonic fat seen along the lateral wall.  Mild right pericolonic and mesenteric lymphadenopathy, suspicious for metastatic disease. No other  sites of metastatic disease identified.  Colonic diverticulosis, without radiographic evidence of diverticulitis. Tiny hiatal hernia, and small epigastric ventral hernia containing only fat.  Aortic atherosclerosis.  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 She was found to have right-sided colon cancer beginning in the cecum. She did have an impressively large at 11cm tumor which invaded around the adjacent small intestine which was removed without complication by Dr Georgette Dover. Her surgical margins were negative. All 19 lymph nodes were negative. -I reviewed her staging CT abd/pel scan, which was negative for metastatic disease. I'll get a CT chest to complete his staging. -She had stage II disease, early-stage but with high risk features, because of her T4b tumor  - I discussed her high recurrence risk due to T4b lesion, especially local recurrence  -Given her high risk of recurrence, I recommend her to consider adjuvant Xeloda. She is 33, but a very healthy and functional, she may be able tolerate Xeloda well. Due to her advanced age, I recommend her take Xeloda for 3 months, instead of standard 6 months therapy.  -She has started adjuvant Xeloda  on 11/25/16, tolerated her first cycle very well, we will continue  -She will have lab and follow-up before each new cycle of Xeloda -We discussed her 11/19/16 CT Chest showed no metastatic disease.  -She started Xeloda 11/25/16 and tolerated cycle 1 very well. -Labs reviewed and adequate to continue treatment. She will start cycle 2 tomorrow  -I offered the flu shot today, she agreed to receive the shot today.  -F/u in 3 weeks    2.) Genetics -Due to her prevalent family history of cancer, I suggested that she should follow up with genetics for testing. She would like to have this performed.  -She does have two  sons and one daughter, I also informed her that if her genetic testing were to come back with abnormal results that her children should be tested as well.  -Also advised that her children should have routine colonoscopies.  -She completed genetic testing 12/03/16 and her results are pending.   3.) Anemia  -We will monitor her Hgb and Iron levels. If these continue to worsen or do not improve, we will consider infusion to aid with this.  -I encouraged her to continue taking her iron supplements at home.   -We will monitor this with her lab work.  -As of 12/03/16 her Hg is 13.5, and Ferritin is normal at 61.  -Hg remains resolved at 13.5 (12/15/16) -She will continue iron supplement for one more month and then she can stop.   4.) Borderline DM and HTN: -She was on BP medication prior to her weight loss, she was taken off of this following her weight loss.   -Continue with weight loss plan for management and I encouraged her to maintain a good diet to manage her sugars.  -She will continue f/u w/ her PCP for this.   PLAN:  -start Xeloda second cycle tomorrow  -Continue iron supplement for 1 more month then stop  -Flu shot today  -Lab and f/u in 3 weeks     No orders of the defined types were placed in this encounter.   All questions were answered. The patient knows to call the clinic with any problems, questions or concerns.   This document serves as a record of services personally performed by Truitt Merle, MD. It was created on her behalf by Joslyn Devon, a trained medical scribe. The creation of this record is based on the scribe's personal observations and the provider's statements to them. This document has been checked and approved by the attending provider.    Truitt Merle, MD 12/15/2016 10:39 PM

## 2016-12-15 NOTE — Telephone Encounter (Signed)
Printed avs and calender for upcoming appointment in.   3 weeks. Per 10/8 los

## 2016-12-22 ENCOUNTER — Telehealth: Payer: Self-pay | Admitting: Genetics

## 2016-12-22 NOTE — Telephone Encounter (Signed)
Revealed negative genetic testing. This normal result is reassuring and indicates that it is unlikely Ashley Peters's cancer is due to a hereditary cause.  It is unlikely that there is an increased risk of another cancer due to a mutation in one of these genes.  However, genetic testing is not perfect, and cannot definitively rule out a hereditary cause.  It will be important for her to keep in contact with genetics to learn if any additional testing may be needed in the future.     I recommended her siblings/nieces/newphes still have genetic testing.  I also recommended that her children have colonoscopies every 5 years based on the family history.

## 2016-12-23 ENCOUNTER — Ambulatory Visit: Payer: Self-pay | Admitting: Genetics

## 2016-12-23 ENCOUNTER — Encounter: Payer: Self-pay | Admitting: Genetics

## 2016-12-23 DIAGNOSIS — Z800 Family history of malignant neoplasm of digestive organs: Secondary | ICD-10-CM

## 2016-12-23 DIAGNOSIS — Z1379 Encounter for other screening for genetic and chromosomal anomalies: Secondary | ICD-10-CM | POA: Insufficient documentation

## 2016-12-23 DIAGNOSIS — Z803 Family history of malignant neoplasm of breast: Secondary | ICD-10-CM

## 2016-12-23 DIAGNOSIS — C182 Malignant neoplasm of ascending colon: Secondary | ICD-10-CM

## 2016-12-23 DIAGNOSIS — Z8049 Family history of malignant neoplasm of other genital organs: Secondary | ICD-10-CM

## 2016-12-23 NOTE — Progress Notes (Signed)
HPI: Ashley Peters was previously seen in the [place] clinic on 12/03/2016 due to a personal and family history of cancer and concerns regarding a hereditary predisposition to cancer. Please refer to our prior cancer genetics clinic note for more information regarding Ashley Peters medical, social and family histories, and our assessment and recommendations, at the time. Ashley Peters recent genetic test results were disclosed to her, as well as recommendations warranted by these results. These results and recommendations are discussed in more detail below.  CANCER HISTORY:    Cancer of right colon ([place])   09/19/2016 Imaging    CT A/P 09/19/16 IMPRESSION: Large 9.5 cm intraluminal mass in the cecum and ascending colon, consistent with colon carcinoma. Tumor extension into adjacent pericolonic fat seen along the lateral wall.  Mild right pericolonic and mesenteric lymphadenopathy, suspicious for metastatic disease. No other sites of metastatic disease identified.  Colonic diverticulosis, without radiographic evidence of diverticulitis. Tiny hiatal hernia, and small epigastric ventral hernia containing only fat.  Aortic atherosclerosis.      10/14/2016 Initial Diagnosis    Cancer of right colon ([place])     10/14/2016 Surgery    OPEN RIGHT HEMICOLECTOMY and REPAIR OF EPIGASTRIC VENTRAL HERNIA by Dr. Georgette Dover and Dr. Dalbert Batman       10/14/2016 Pathology Results    10/14/16 Diagnosis Colon, segmental resection for tumor, Right ADENOCARCINOMA WITH EXTENSIVE EXTRA CELLULAR MUCIN, GRADE 1, (11.0 CM) THE TUMOR INVADES THROUGH THE CECUM WALL INTO ADJACENT TERMINAL ILEUM (PT4B) NINETEEN BENIGN LYMPH NODES (0/19) SUPPURATIVE INFLAMMATION WITH FIBROSIS AND ADHESION TO ABDOMINAL WALL NO ADENOCARCINOMA IDENTIFIED      11/19/2016 Imaging    CT Chest WO Contrast 11/19/16 IMPRESSION: 1. No evidence of metastatic disease. 2. Aortic atherosclerosis (ICD10-170.0). Coronary  artery calcification.      11/25/2016 -  Chemotherapy    Adjuvant Xeloda two weeks on, one week off starting 11/25/16 for 3 months        12/21/2016 Genetic Testing    Patient had genetic testing due to a personal history of colon cancer and a family history of breast, uterine, and colon cancer. The Common Hereditary Cancers Panel was ordered.  The Hereditary Gene Panel offered by [facility] includes sequencing and/or deletion duplication testing of the following 47 genes: APC, ATM, AXIN2, BARD1, BMPR1A, BRCA1, BRCA2, BRIP1, CDH1, CDKN2A (p14ARF), CDKN2A (p16INK4a), CHEK2, CDK4, CTNNA1, DICER1, EPCAM (Deletion/duplication testing only), GREM1 (promoter region deletion/duplication testing only), KIT, MEN1, MLH1, MSH2, MSH3, MSH6, MUTYH, NBN, NF1, NHTL1, PALB2, PDGFRA, PMS2, POLD1, POLE, PTEN, RAD50, RAD51C, RAD51D, SDHB, SDHC, SDHD, SMAD4, SMARCA4. STK11, TP53, TSC1, TSC2, and VHL.  The following genes were evaluated for sequence changes only: SDHA and HOXB13 c.251G>A variant only.  Results: Negative- no pathogenic variants identified.  The date of this test report is 12/21/2016.         FAMILY HISTORY:  We obtained a detailed, 4-generation family history.  Significant diagnoses are listed below: Family History  Problem Relation Age of Onset  . Stroke Father   . Breast cancer Sister        dx 5's, died at 38  . Diabetes Brother   . Kidney disease Brother   . Heart disease Brother   . Epilepsy Son   . Colon cancer Other 62  . Uterine cancer Other 28  . Hodgkin's lymphoma Other    Ashley Peters has a 30 year-old daughter and 2 sons (ages 21 and 79).  She does not have any biological grandchildren.  Ashley Peters has 2 sisters and 2 brothers listed below: -1 sister was diagnosed with breast cancer in her 49's and died at 21.  This sister has a son and a daughter in their 68's with no history of cancer.   -1 sister is 49 with no history of cancer.  This sister has a son and a daughter.  The  daughter (patient's niece) is 15 and has recently been diagnosed with uterine cancer.  This niece also has a history of Hodgkins lymphoma.   -1 is brother is 71 with no history of cancer. This brother has 2 daughters and a son.  The son was diagnosed with colon cancer in his late 13's.   -1 brother died at 75 without any history of cancer.  He had no children.   Ashley Peters father died at 54 with no history of cancer.  Ashley Peters has a paternal uncle who died in his 18's/80's with no history of cancer.  He had a son and a daughter with no known history of cancer. Ms. Peters paternal grandfather and grandmother both died at older ages (83's/80's) but she does not know the cause- no cancer history that she is aware of.    Ms. Peters mother died in her 24's with no history of caner.  Ms. Peters has 2 maternal aunts and 3 maternal uncles.  All of these aunts and uncles died older than 66 and had no history of cancer.  Ashley Peters has several maternal cousins, none with any known history of cancer. Ashley Peters maternal grandmother and grandfather did not have any history of cancer and died from 24old age'.   Ashley Peters is unaware of previous family history of genetic testing for hereditary cancer risks. Patient's maternal ancestors are of Caucasian descent, and paternal ancestors are of Caucasian descent. There is no reported Ashkenazi Jewish ancestry. There is no known consanguinity.  GENETIC TEST RESULTS: Genetic testing performed through [facility]'s Common Hereditary Cancers Panel reported out on 12/21/2016 showed no pathogenic mutations.   The Hereditary Gene Panel offered by [facility] includes sequencing and/or deletion duplication testing of the following 47 genes: APC, ATM, AXIN2, BARD1, BMPR1A, BRCA1, BRCA2, BRIP1, CDH1, CDKN2A (p14ARF), CDKN2A (p16INK4a), CKD4, CHEK2, CTNNA1, DICER1, EPCAM (Deletion/duplication testing only), GREM1 (promoter region deletion/duplication testing only), KIT, MEN1,  MLH1, MSH2, MSH3, MSH6, MUTYH, NBN, NF1, NHTL1, PALB2, PDGFRA, PMS2, POLD1, POLE, PTEN, RAD50, RAD51C, RAD51D, SDHB, SDHC, SDHD, SMAD4, SMARCA4. STK11, TP53, TSC1, TSC2, and VHL.  The following genes were evaluated for sequence changes only: SDHA and HOXB13 c.251G>A variant only..  The test report will be scanned into EPIC and will be located under the Molecular Pathology section of the Results Review tab.A portion of the result report is included below for reference.     We discussed with Ashley Peters that because current genetic testing is not perfect, it is possible there may be a gene mutation in one of these genes that current testing cannot detect, but that chance is small. We also discussed, that there could be another gene that has not yet been discovered, or that we have not yet tested, that is responsible for the cancer diagnoses in the family. There could also be a hereditary cause for the cancer in her family that Ashley Peters did not inherit and therefore was not identified in her genetic testing.  Therefore, it is important to remain in touch with cancer genetics in the future so that we can continue to offer Ashley Peters the most up  to date genetic testing.   ADDITIONAL GENETIC TESTING: We discussed with Ashley Peters that there are other genes that are associated with increased cancer risk that can be analyzed. The laboratories that offer this testing look at these additional genes via a hereditary cancer gene panel. Should Ashley Peters wish to pursue additional genetic testing, we are happy to discuss and coordinate this testing, at any time.    CANCER SCREENING RECOMMENDATIONS: Your test result is considered negative (normal).  This means that we have not identified a hereditary cause for her personal and family history of cancer at this time.   While reassuring, this does not definitively rule out a hereditary basis for her or her family's cancer. It is still possible that there could be  genetic mutations that are undetectable by current technology, or genetic mutations in genes that have not been tested or identified to increase cancer risk.    RECOMMENDATIONS FOR FAMILY MEMBERS: Women in this family might be at some increased risk of developing cancer, over the general population risk, simply due to the family history of cancer. We recommended women in this family have a yearly mammogram beginning at age 20, or 18 years younger than the earliest onset of cancer, an annual clinical breast exam, and perform monthly breast self-exams. Women in this family should also have a gynecological exam as recommended by their primary provider.   All family members should have a colonoscopy by age 27.  We recommended Ashley Peters children have colonoscopies every 5 years or as recommended by their physicians.  Other relatives (such as Ashley Peters siblings, nieces/nephews, etc) should inform their doctors about the family history of cancer so they can make the most appropriate cancer screening recommendations for them.   Based on Ashley Peters family history, we recommended her siblings and nieces/nephews, especially those diagnosed with cancer, have genetic counseling and testing.  Other relatives such as aunts/uncles/cousins, etc. also appear to meet medical criteria for genetic testing.  Ms. Sanpedro will let us know if we can be of any assistance in coordinating genetic counseling and/or testing for these family members.   FOLLOW-UP: Lastly, we discussed with Ms. Cullinane that cancer genetics is a rapidly advancing field and it is possible that new genetic tests will be appropriate for her and/or her family members in the future. We encouraged her to remain in contact with cancer genetics on an annual basis so we can update her personal and family histories and let her know of advances in cancer genetics that may benefit this family.   Our contact number was provided. Ms. Kesinger questions were  answered to her satisfaction, and she knows she is welcome to call us at anytime with additional questions or concerns.   Ferol Luz, MS Genetic Counselor [email or web address] [email or web address]

## 2016-12-25 ENCOUNTER — Ambulatory Visit
Admission: RE | Admit: 2016-12-25 | Discharge: 2016-12-25 | Disposition: A | Discharge status: Home / Self Care | Payer: Medicare Other | Source: Ambulatory Visit | Attending: Family Medicine | Admitting: Family Medicine

## 2016-12-25 DIAGNOSIS — Z1231 Encounter for screening mammogram for malignant neoplasm of breast: Secondary | ICD-10-CM | POA: Diagnosis not present

## 2016-12-29 MED FILL — CAPECITABINE 500 MG TABLET: 500 | 14 days supply | Qty: 84 | Fill #2

## 2017-01-01 ENCOUNTER — Telehealth: Payer: Self-pay

## 2017-01-01 ENCOUNTER — Other Ambulatory Visit: Payer: Self-pay | Admitting: Nurse Practitioner

## 2017-01-01 MED ORDER — ONDANSETRON HCL 8 MG PO TABS: 8.0000 mg | ORAL_TABLET | Freq: Three times a day (TID) | ORAL | 2 refills | Status: DC | PRN

## 2017-01-01 NOTE — Telephone Encounter (Signed)
She is on her off week cycle 2 of xeloda. Sat and Sunday, feet hurting and a little trouble walking that got better. Today is back some. Pain in her feet, not bright red or peeling.  Nausea too. No vomiting. No nausea med on MAR.  Diarrhea x2 yesterday.  For foot pain discussed wearing shoes and socks, avoiding hot shower, using emollient cream on feet, try tylenol. For diarrhea discussed OTC imodium. Will request nausea medication to be rx to [facility].  S/w Lacie and called [facility] back. Lacie agreed with prior instructions. She did order zofran. Pt can also use OTC hydrocortisone cream for her feet (and hands if they start to get red and sore).

## 2017-01-01 NOTE — Progress Notes (Signed)
Per triage message from [place], [place], patient having foot pain without redness, nausea, and diarrhea x2 on her off week of xeloda. She takes 3 tabs BID x14 days. RN recommended otc imodium for diarrhea. I called in zofran 8 mg q8 PRN for nausea and recommend otc hydrocortisone cream for her feet. If that is not helpful, will call in urea cream.

## 2017-01-02 NOTE — Progress Notes (Signed)
[place]  Telephone:([phone]) [phone] Fax:(336) [phone]  Clinic Follow up Note   Patient Care Team: Dettinger, [name], MD as PCP - General (Family Medicine) Clent Jacks, MD as Consulting Physician (Ophthalmology) Zadie Rhine Clent Demark, MD as Consulting Physician (Ophthalmology) Donnie Mesa, MD as Consulting Physician (General Surgery) Truitt Merle, MD as Consulting Physician (Hematology) 01/05/2017   CHIEF COMPLAINTS:  Follow up cancer of the right colon     Cancer of right colon ([place])   09/19/2016 Imaging    CT A/P 09/19/16 IMPRESSION: Large 9.5 cm intraluminal mass in the cecum and ascending colon, consistent with colon carcinoma. Tumor extension into adjacent pericolonic fat seen along the lateral wall.  Mild right pericolonic and mesenteric lymphadenopathy, suspicious for metastatic disease. No other sites of metastatic disease identified.  Colonic diverticulosis, without radiographic evidence of diverticulitis. Tiny hiatal hernia, and small epigastric ventral hernia containing only fat.  Aortic atherosclerosis.      10/14/2016 Initial Diagnosis    Cancer of right colon ([place])     10/14/2016 Surgery    OPEN RIGHT HEMICOLECTOMY and REPAIR OF EPIGASTRIC VENTRAL HERNIA by Dr. Georgette Dover and Dr. Dalbert Batman       10/14/2016 Pathology Results    10/14/16 Diagnosis Colon, segmental resection for tumor, Right ADENOCARCINOMA WITH EXTENSIVE EXTRA CELLULAR MUCIN, GRADE 1, (11.0 CM) THE TUMOR INVADES THROUGH THE CECUM WALL INTO ADJACENT TERMINAL ILEUM (PT4B) NINETEEN BENIGN LYMPH NODES (0/19) SUPPURATIVE INFLAMMATION WITH FIBROSIS AND ADHESION TO ABDOMINAL WALL NO ADENOCARCINOMA IDENTIFIED      11/19/2016 Imaging    CT Chest WO Contrast 11/19/16 IMPRESSION: 1. No evidence of metastatic disease. 2. Aortic atherosclerosis (ICD10-170.0). Coronary artery calcification.      11/25/2016 -  Chemotherapy    Adjuvant Xeloda two weeks on, one week off starting 11/25/16 for  3 months        12/21/2016 Genetic Testing    Patient had genetic testing due to a personal history of colon cancer and a family history of breast, uterine, and colon cancer. The Common Hereditary Cancers Panel was ordered.  The Hereditary Gene Panel offered by [facility] includes sequencing and/or deletion duplication testing of the following 47 genes: APC, ATM, AXIN2, BARD1, BMPR1A, BRCA1, BRCA2, BRIP1, CDH1, CDKN2A (p14ARF), CDKN2A (p16INK4a), CHEK2, CDK4, CTNNA1, DICER1, EPCAM (Deletion/duplication testing only), GREM1 (promoter region deletion/duplication testing only), KIT, MEN1, MLH1, MSH2, MSH3, MSH6, MUTYH, NBN, NF1, NHTL1, PALB2, PDGFRA, PMS2, POLD1, POLE, PTEN, RAD50, RAD51C, RAD51D, SDHB, SDHC, SDHD, SMAD4, SMARCA4. STK11, TP53, TSC1, TSC2, and VHL.  The following genes were evaluated for sequence changes only: SDHA and HOXB13 c.251G>A variant only.  Results: Negative- no pathogenic variants identified.  The date of this test report is 12/21/2016.         HISTORY OF PRESENTING ILLNESS:  Ashley Peters 83 y.o. female is here because of cancer of the right colon. She presents with her daughter and husband. Initially, pt presented to her PCP's office on 09/19/16 for a 81mocheck-up and with complaints of worsening right lower quadrant pain that had been ongoing for several weeks. This was accompanied by fatigue, chills, and weight loss of approximately 30lbs over the past year. However, she reports that she was enrolled in a nutrition program for diabetics to help her lose weight (she is only borderline diabetic). Her daughter was able to notice her fatigue as well. She had not noticed any bloody stools, however, she did notice some melena but was on iron supplements for her anemia. She denies any significant  constipation with this. She had also noticed a small area of distension over her area of pain in the right lower abdomen which she had not noticed previously. CT A/P was performed the same  day which was remarkable for a large 9.5cm intraluminal mass in the cecum and ascending colon, consistent with colon carcinoma. There was tumor extension into the adjacent pericolonic fat seen along the lateral abdominal wall as well. Additionally, mild right pericolonic and mesenteric lymphadenopathy was also seen which was suspicious for metastatic disease. An epigastric ventral hernia was also found to be present on this scan.   Following these findings, she was subsequently referred to Dr Georgette Dover who performed both an open right hemicolectomy and epigastric ventral hernia repair on 10/14/16. She tolerated this procedure well and was monitored in the hospital until her discharge on 10/17/16, during which she recovered well. She was referred to medical oncology following her surgery and she has been recovering well since d/c. Since her surgery she denies any pain returning to the area. Her bowel movements have been normal and regular since her surgery and her appetite has been well. Her fatigue has also been improving and back to normal.   She currently lives alone in [place], [place] and very independent and active.    CURRENT THERAPY: Adjuvant Xeloda 1510m q12h,  two weeks on, one week off starting 11/25/16 for 3 months    INTERVAL HISTORY:  Ashley BRASSFIELDis here for a follow up. She presents to the clinic today with her daughter.  She notes she has been experiencing diarrhea and nausea. This started last week on her week off of Xeloda. She is suppose to start her cycle 3 tomorrow. She has taken imodium 2 times at first sign of diarrhea and then one afterward. It would stop for a while then her diarrhea would restart. She denies fever. When she eats she feels nauseous. She gets abdominal pain when she eats. Her daughter notes she has not been eaten well lately and she has loss weight 8 pounds due to this episode.  She notes her hands and the soles of her feet have been red and she has been using Eucerin  and Hydrocortisone.    MEDICAL HISTORY:  Past Medical History:  Diagnosis Date  . Anemia   . Cancer ([facility]   . Colon cancer ([facility]   . Diverticulosis   . Family history of breast cancer   . Family history of colon cancer   . Family history of uterine cancer   . GERD (gastroesophageal reflux disease)   . History of kidney stones   . Hyperlipidemia   . Hypertension   . Macular degeneration   . Osteopenia     SURGICAL HISTORY: Past Surgical History:  Procedure Laterality Date  . ABDOMINAL HYSTERECTOMY    . ablation tr Great saphenous vein  10/17/2009  . BREAST EXCISIONAL BIOPSY Bilateral 1971  . brest biopsy  1971  . CHOLECYSTECTOMY    . EPIGASTRIC HERNIA REPAIR N/A 10/14/2016   Procedure: REPAIR OF EPIGASTRIC VENTRAL HERNIA;  Surgeon: TDonnie Mesa MD;  [facility]  Service: General;  Laterality: N/A;  . lt. distal ureteral stone extraction  09/14/1991  . PARTIAL COLECTOMY Right 10/14/2016   Procedure: OPEN RIGHT HEMICOLECTOMY;  Surgeon: TDonnie Mesa MD;  [facility]  Service: General;  Laterality: Right;    SOCIAL HISTORY: Social History   Social History  . Marital status: Widowed    Spouse name: N/A  . Number of  children: N/A  . Years of education: N/A   Occupational History  . Not on file.   Social History Main Topics  . Smoking status: Never Smoker  . Smokeless tobacco: Never Used  . Alcohol use No  . Drug use: No  . Sexual activity: Yes   Other Topics Concern  . Not on file   Social History Narrative  . No narrative on file    FAMILY HISTORY: Family History  Problem Relation Age of Onset  . Stroke Father   . Breast cancer Sister        dx 28's, died at 74  . Diabetes Brother   . Kidney disease Brother   . Heart disease Brother   . Epilepsy Son   . Colon cancer Other 42  . Uterine cancer Other 13  . Hodgkin's lymphoma Other     ALLERGIES:  is allergic to benicar [olmesartan medoxomil] and amoxicillin.  MEDICATIONS:  Current  Outpatient Prescriptions  Medication Sig Dispense Refill  . acetaminophen (TYLENOL) 500 MG tablet Take 2 tablets (1,000 mg total) by mouth every 6 (six) hours as needed for moderate pain. 30 tablet 0  . capecitabine (XELODA) 500 MG tablet Take 3 tablets (1,500 mg total) by mouth 2 (two) times daily after a meal. Take for 14 days on, 7 days off, repeat every 21 days 84 tablet 2  . cholecalciferol (VITAMIN D) 1000 units tablet Take 1,000 Units by mouth daily.    . ferrous sulfate 325 (65 FE) MG tablet Take 1 tablet (325 mg total) by mouth 2 (two) times daily with a meal. 60 tablet 3  . Multiple Vitamins-Minerals (PRESERVISION AREDS 2 PO) Take 1 capsule by mouth 2 (two) times daily.    . ondansetron (ZOFRAN) 8 MG tablet Take 1 tablet (8 mg total) by mouth every 8 (eight) hours as needed for nausea or vomiting. 30 tablet 2  . diphenoxylate-atropine (LOMOTIL) 2.5-0.025 MG tablet Take 1 tablet by mouth 4 (four) times daily as needed for diarrhea or loose stools. 20 tablet 0   No current facility-administered medications for this visit.     REVIEW OF SYSTEMS:   Constitutional: Denies fevers, chills or abnormal night sweats  (+) low appetite (+) weight loss  Eyes: Denies blurriness of vision, double vision or watery eyes Ears, nose, mouth, throat, and face: Denies mucositis or sore throat Respiratory: Denies cough, dyspnea or wheezes Cardiovascular: Denies palpitation, chest discomfort or lower extremity swelling Gastrointestinal:  Denies heartburn (+) Diarrhea (+) nausea  (+) abdominal pain  Skin: Denies abnormal skin rashes (+) redness and dryness of palms and soles of feet Lymphatics: Denies new lymphadenopathy or easy bruising Neurological:Denies numbness, tingling or new weaknesses Behavioral/Psych: Mood is stable, no new changes  All other systems were reviewed with the patient and are negative.  PHYSICAL EXAMINATION:  ECOG PERFORMANCE STATUS: 2  Vitals:   01/05/17 0816  BP: 109/62    Pulse: (!) 118  Resp: 20  Temp: (!) 97 F (36.1 C)  SpO2: 97%   Filed Weights   01/05/17 0816  Weight: 108 lb 8 oz (49.2 kg)    GENERAL:alert, no distress and comfortable SKIN: skin color, texture, turgor are normal, no rashes or significant lesions EYES: normal, conjunctiva are pink and non-injected, sclera clear OROPHARYNX:no exudate, no erythema and lips, buccal mucosa, and tongue normal  NECK: supple, thyroid normal size, non-tender, without nodularity LYMPH:  no palpable lymphadenopathy in the cervical, axillary or inguinal LUNGS: clear to auscultation and percussion with  normal breathing effort HEART: regular rhythm and no murmurs and no lower extremity edema (+) mild tachycardia  ABDOMEN:abdomen soft, non-tender and normal bowel sounds. Midline surgical incision has healed well. Musculoskeletal:no cyanosis of digits and no clubbing  PSYCH: alert & oriented x 3 with fluent speech NEURO: no focal motor/sensory deficits  LABORATORY DATA:  I have reviewed the data as listed CBC Latest Ref Rng & Units 01/05/2017 12/15/2016 12/03/2016  WBC 3.9 - 10.3 10e3/uL 6.4 7.1 5.3  Hemoglobin 11.6 - 15.9 g/dL 16.6(H) 13.5 13.5  Hematocrit 34.8 - 46.6 % 48.9(H) 41.5 42.4  Platelets 145 - 400 10e3/uL 375 217 197   PATHOLOGY REPORTS:   10/14/16 Diagnosis Colon, segmental resection for tumor, Right ADENOCARCINOMA WITH EXTENSIVE EXTRA CELLULAR MUCIN, GRADE 1, (11.0 CM) THE TUMOR INVADES THROUGH THE CECUM WALL INTO ADJACENT TERMINAL ILEUM (PT4B) NINETEEN BENIGN LYMPH NODES (0/19) SUPPURATIVE INFLAMMATION WITH FIBROSIS AND ADHESION TO ABDOMINAL WALL NO ADENOCARCINOMA IDENTIFIED Microscopic Comment COLON AND RECTUM (INCLUDING TRANS-ANAL RESECTION): Specimen: Right colon with terminal ileum Procedure: Segmental resection Tumor site: Cecum and terminal ileum Specimen integrity: Intact Macroscopic intactness of mesorectum: Not applicable: X Complete: NA Near complete: NA Incomplete:  NA Cannot be determined (specify): NA Macroscopic tumor perforation: Invades adjacent structure Invasive tumor: Maximum size: 11.0 cm Histologic type(s): Adenocarcinoma with extracellular mucin Histologic grade and differentiation: G1: well differentiated/low grade G2: moderately differentiated/low grade G3: poorly differentiated/high grade G4: undifferentiated/high grade Type of polyp in which invasive carcinoma arose: Tubular adenoma Microscopic extension of invasive tumor: Invades adjacent terminal ileum 1 of 5 Supplemental copy SUPPLEMENTAL for NECOLA, BLUESTEIN ([id number]) Microscopic Comment(continued) Lymph-Vascular invasion: Negative Peri-neural invasion: Negative Tumor deposit(s) (discontinuous extramural extension): Negative Resection margins: Proximal margin: Negative Distal margin: Negative Circumferential (radial) (posterior ascending, posterior descending; lateral and posterior mid-rectum; and entire lower 1/3 rectum):NA Mesenteric margin (sigmoid and transverse): NA Distance closest margin (if all above margins negative): 1.5 cm from abdominal wall adhesion resection Trans-anal resection margins only: Deep margin: NA Mucosal Margin: NA Distance closest mucosal margin (if negative): NA Treatment effect (neo-adjuvant therapy): Negative Additional polyp(s): Negative Non-neoplastic findings: Unremarkable Lymph nodes: number examined 19; number positive: 0 Pathologic Staging: pT4b, pN0, pMx Ancillary studies: Ordered  RADIOGRAPHIC STUDIES: I have personally reviewed the radiological images as listed and agreed with the findings in the report.  Mm Screening Breast Tomo Bilateral  Result Date: 12/25/2016 CLINICAL DATA:  Screening. EXAM: 2D DIGITAL SCREENING BILATERAL MAMMOGRAM WITH CAD AND ADJUNCT TOMO COMPARISON:  Previous exam(s). ACR Breast Density Category c: The breast tissue is heterogeneously dense, which may obscure small masses. FINDINGS: There are no  findings suspicious for malignancy. Images were processed with CAD. IMPRESSION: No mammographic evidence of malignancy. A result letter of this screening mammogram will be mailed directly to the patient. RECOMMENDATION: Screening mammogram in one year. (Code:SM-B-01Y) BI-RADS CATEGORY  1: Negative. Electronically Signed   By: Margarette Canada M.D.   On: 12/25/2016 13:18     CT A/P 09/19/16 IMPRESSION: Large 9.5 cm intraluminal mass in the cecum and ascending colon, consistent with colon carcinoma. Tumor extension into adjacent pericolonic fat seen along the lateral wall.  Mild right pericolonic and mesenteric lymphadenopathy, suspicious for metastatic disease. No other sites of metastatic disease identified.  Colonic diverticulosis, without radiographic evidence of diverticulitis. Tiny hiatal hernia, and small epigastric ventral hernia containing only fat.  Aortic atherosclerosis.  ASSESSMENT: 83 y.o. 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 She was found to have right-sided colon cancer beginning in the cecum. She did have an impressively large at 11cm tumor which invaded around the adjacent small intestine which was removed without complication by Dr Georgette Dover. Her surgical margins were negative. All 19 lymph nodes were negative. -I reviewed her staging CT abd/pel scan and chest, which was negative for metastatic disease.  -She had stage II disease, early-stage but with high risk features, because of her T4b tumor  - I discussed her high recurrence risk due to T4b lesion, especially local recurrence  -Given her high risk of recurrence, I recommend her to consider adjuvant Xeloda. She is 75, but a very healthy and functional, she may be able tolerate Xeloda well. Due to her advanced age, I recommend her take Xeloda for 3 months, instead of standard 6  months therapy.  -She has started adjuvant Xeloda on 11/25/16, tolerated her first cycle very well, we will continue  -She did not tolerated cycle 2 Xeloda well due to diarrhea and nausea. She is dehydrated and is mildly tacycardic I will give IV fluids and prescribe lomotil.  -Will hold Xeloda until next visit to allow her recover better. If she recovers well, may consider 1-2 more cycles of Xeloda with dose reduction  -F/u in 2 weeks    2.) Genetics -Due to her prevalent family history of cancer, I suggested that she should follow up with genetics for testing. She would like to have this performed.  -She does have two sons and one daughter, I also informed her that if her genetic testing were to come back with abnormal results that her children should be tested as well.  -Also advised that her children should have routine colonoscopies.  -She completed genetic testing 12/03/16 and her results are negative  3.) Anemia  -We will monitor her Hgb and Iron levels. If these continue to worsen or do not improve, we will consider infusion to aid with this.  -I encouraged her to continue taking her iron supplements at home.   -We will monitor this with her lab work.  -As of 12/03/16 her Hg is 13.5, and Ferritin is normal at 61.  -Hg remains resolved at 13.5 (12/15/16) -She will continue iron supplement for one more month and then she can stop.   4.) Borderline DM and HTN: -She was on BP medication prior to her weight loss, she was taken off of this following her weight loss.   -Continue with weight loss plan for management and I encouraged her to maintain a good diet to manage her sugars.  -She will continue f/u w/ her PCP for this.   5) Diarrhea, Nausea -secondary to Xeloda -I recommend she takes 3-4 imodium when she notices more than 2 watery stool a day. She can take up to 4-6 tablets in a day. I will prescribe lomotil and if the imodium does not work she can take 1-2 lomotil.  -I suggest she  take anti-emetics 30 minutes before eating -IVF Today  6) Weight loss, Low appetite, Abdominal pain -I encouraged her to take ensure boosts to help supplement her food intake -I suggest she avoid foods that may irritate her stomach  -I encouraged her taking nutritional supplement. I spoke with dietitian Pamala Hurry today, she will see patient today.  7) Hand-foot syndrome  -Secondary to Xeloda  -She uses Eucerin and Hydrocortisone, it's improving   PLAN:  -IV Fluids NS 750ML over 2  hrs  -Prescribe Lomotil today -Send Dietician referral, will be seen today  -Hold Xeloda for now, re-evaluate on next visit  -Lab and f/u in 2 weeks for re-evaluation     No orders of the defined types were placed in this encounter.   All questions were answered. The patient knows to call the clinic with any problems, questions or concerns.   This document serves as a record of services personally performed by Truitt Merle, MD. It was created on her behalf by Joslyn Devon, a trained medical scribe. The creation of this record is based on the scribe's personal observations and the provider's statements to them. This document has been checked and approved by the attending provider.    Truitt Merle, MD 01/05/2017 9:02 AM

## 2017-01-05 ENCOUNTER — Other Ambulatory Visit (HOSPITAL_BASED_OUTPATIENT_CLINIC_OR_DEPARTMENT_OTHER): Payer: Medicare Other

## 2017-01-05 ENCOUNTER — Encounter: Payer: Self-pay | Admitting: Hematology

## 2017-01-05 ENCOUNTER — Telehealth: Payer: Self-pay

## 2017-01-05 ENCOUNTER — Telehealth: Payer: Self-pay | Admitting: Hematology

## 2017-01-05 ENCOUNTER — Ambulatory Visit: Payer: Medicare Other | Admitting: Nutrition

## 2017-01-05 ENCOUNTER — Ambulatory Visit (HOSPITAL_BASED_OUTPATIENT_CLINIC_OR_DEPARTMENT_OTHER): Payer: Medicare Other

## 2017-01-05 ENCOUNTER — Ambulatory Visit (HOSPITAL_BASED_OUTPATIENT_CLINIC_OR_DEPARTMENT_OTHER): Payer: Medicare Other | Admitting: Hematology

## 2017-01-05 VITALS — BP 109/62 | HR 118 | Temp 97.0°F | Resp 20 | Ht <= 58 in | Wt 108.5 lb

## 2017-01-05 DIAGNOSIS — I10 Essential (primary) hypertension: Secondary | ICD-10-CM

## 2017-01-05 DIAGNOSIS — C182 Malignant neoplasm of ascending colon: Secondary | ICD-10-CM | POA: Diagnosis not present

## 2017-01-05 DIAGNOSIS — D649 Anemia, unspecified: Secondary | ICD-10-CM | POA: Diagnosis not present

## 2017-01-05 DIAGNOSIS — R634 Abnormal weight loss: Secondary | ICD-10-CM

## 2017-01-05 DIAGNOSIS — R109 Unspecified abdominal pain: Secondary | ICD-10-CM | POA: Diagnosis not present

## 2017-01-05 DIAGNOSIS — R630 Anorexia: Secondary | ICD-10-CM

## 2017-01-05 DIAGNOSIS — R197 Diarrhea, unspecified: Secondary | ICD-10-CM | POA: Diagnosis not present

## 2017-01-05 DIAGNOSIS — E119 Type 2 diabetes mellitus without complications: Secondary | ICD-10-CM | POA: Diagnosis not present

## 2017-01-05 DIAGNOSIS — D500 Iron deficiency anemia secondary to blood loss (chronic): Secondary | ICD-10-CM

## 2017-01-05 DIAGNOSIS — R110 Nausea: Secondary | ICD-10-CM | POA: Diagnosis present

## 2017-01-05 LAB — FERRITIN: FERRITIN: 115 ng/mL (ref 9–269)

## 2017-01-05 LAB — COMPREHENSIVE METABOLIC PANEL
ALBUMIN: 2.8 g/dL — AB (ref 3.5–5.0)
ALT: 13 U/L (ref 0–55)
AST: 11 U/L (ref 5–34)
Alkaline Phosphatase: 45 U/L (ref 40–150)
Anion Gap: 12 mEq/L — ABNORMAL HIGH (ref 3–11)
BILIRUBIN TOTAL: 0.57 mg/dL (ref 0.20–1.20)
BUN: 16.9 mg/dL (ref 7.0–26.0)
CALCIUM: 9 mg/dL (ref 8.4–10.4)
CO2: 23 mEq/L (ref 22–29)
Chloride: 103 mEq/L (ref 98–109)
Creatinine: 0.8 mg/dL (ref 0.6–1.1)
GLUCOSE: 124 mg/dL (ref 70–140)
Potassium: 3.2 mEq/L — ABNORMAL LOW (ref 3.5–5.1)
SODIUM: 138 meq/L (ref 136–145)
TOTAL PROTEIN: 5.8 g/dL — AB (ref 6.4–8.3)

## 2017-01-05 LAB — CBC & DIFF AND RETIC
BASO%: 0.3 % (ref 0.0–2.0)
Basophils Absolute: 0 10*3/uL (ref 0.0–0.1)
EOS%: 0.6 % (ref 0.0–7.0)
Eosinophils Absolute: 0 10*3/uL (ref 0.0–0.5)
HCT: 48.9 % — ABNORMAL HIGH (ref 34.8–46.6)
HEMOGLOBIN: 16.6 g/dL — AB (ref 11.6–15.9)
Immature Retic Fract: 3.8 % (ref 1.60–10.00)
LYMPH#: 1.7 10*3/uL (ref 0.9–3.3)
LYMPH%: 26.6 % (ref 14.0–49.7)
MCH: 28.9 pg (ref 25.1–34.0)
MCHC: 33.9 g/dL (ref 31.5–36.0)
MCV: 85.2 fL (ref 79.5–101.0)
MONO#: 1.2 10*3/uL — ABNORMAL HIGH (ref 0.1–0.9)
MONO%: 18.5 % — ABNORMAL HIGH (ref 0.0–14.0)
NEUT%: 54 % (ref 38.4–76.8)
NEUTROS ABS: 3.5 10*3/uL (ref 1.5–6.5)
PLATELETS: 375 10*3/uL (ref 145–400)
RBC: 5.74 10*6/uL — ABNORMAL HIGH (ref 3.70–5.45)
RDW: 20.4 % — ABNORMAL HIGH (ref 11.2–14.5)
RETIC CT ABS: 63.14 10*3/uL (ref 33.70–90.70)
Retic %: 1.1 % (ref 0.70–2.10)
WBC: 6.4 10*3/uL (ref 3.9–10.3)

## 2017-01-05 MED ORDER — SODIUM CHLORIDE 0.9 % IV SOLN
INTRAVENOUS | Status: AC
  Administered 2017-01-05: 10:00:00 via INTRAVENOUS

## 2017-01-05 MED ORDER — DIPHENOXYLATE-ATROPINE 2.5-0.025 MG PO TABS: 1.0000 | ORAL_TABLET | Freq: Four times a day (QID) | ORAL | 0 refills | Status: DC | PRN

## 2017-01-05 MED ORDER — POTASSIUM CHLORIDE CRYS ER 20 MEQ PO TBCR: 20.0000 meq | EXTENDED_RELEASE_TABLET | Freq: Two times a day (BID) | ORAL | 0 refills | Status: DC

## 2017-01-05 NOTE — Telephone Encounter (Signed)
Patient was added per Rn request. RN will slide patient to area  And time of choice. Per 10/29 RN. Requested order

## 2017-01-05 NOTE — Patient Instructions (Signed)
Dehydration, Adult Dehydration is when there is not enough fluid or water in your body. This happens when you lose more fluids than you take in. Dehydration can range from mild to very bad. It should be treated right away to keep it from getting very bad. Symptoms of mild dehydration may include:  Thirst.  Dry lips.  Slightly dry mouth.  Dry, warm skin.  Dizziness. Symptoms of moderate dehydration may include:  Very dry mouth.  Muscle cramps.  Dark pee (urine). Pee may be the color of tea.  Your body making less pee.  Your eyes making fewer tears.  Heartbeat that is uneven or faster than normal (palpitations).  Headache.  Light-headedness, especially when you stand up from sitting.  Fainting (syncope). Symptoms of very bad dehydration may include:  Changes in skin, such as: ? Cold and clammy skin. ? Blotchy (mottled) or pale skin. ? Skin that does not quickly return to normal after being lightly pinched and let go (poor skin turgor).  Changes in body fluids, such as: ? Feeling very thirsty. ? Your eyes making fewer tears. ? Not sweating when body temperature is high, such as in hot weather. ? Your body making very little pee.  Changes in vital signs, such as: ? Weak pulse. ? Pulse that is more than 100 beats a minute when you are sitting still. ? Fast breathing. ? Low blood pressure.  Other changes, such as: ? Sunken eyes. ? Cold hands and feet. ? Confusion. ? Lack of energy (lethargy). ? Trouble waking up from sleep. ? Short-term weight loss. ? Unconsciousness. Follow these instructions at home:  If told by your doctor, drink an ORS: ? Make an ORS by using instructions on the package. ? Start by drinking small amounts, about  cup (120 mL) every 5-10 minutes. ? Slowly drink more until you have had the amount that your doctor said to have.  Drink enough clear fluid to keep your pee clear or pale yellow. If you were told to drink an ORS, finish the ORS  first, then start slowly drinking clear fluids. Drink fluids such as: ? Water. Do not drink only water by itself. Doing that can make the salt (sodium) level in your body get too low (hyponatremia). ? Ice chips. ? Fruit juice that you have added water to (diluted). ? Low-calorie sports drinks.  Avoid: ? Alcohol. ? Drinks that have a lot of sugar. These include high-calorie sports drinks, fruit juice that does not have water added, and soda. ? Caffeine. ? Foods that are greasy or have a lot of fat or sugar.  Take over-the-counter and prescription medicines only as told by your doctor.  Do not take salt tablets. Doing that can make the salt level in your body get too high (hypernatremia).  Eat foods that have minerals (electrolytes). Examples include bananas, oranges, potatoes, tomatoes, and spinach.  Keep all follow-up visits as told by your doctor. This is important. Contact a doctor if:  You have belly (abdominal) pain that: ? Gets worse. ? Stays in one area (localizes).  You have a rash.  You have a stiff neck.  You get angry or annoyed more easily than normal (irritability).  You are more sleepy than normal.  You have a harder time waking up than normal.  You feel: ? Weak. ? Dizzy. ? Very thirsty.  You have peed (urinated) only a small amount of very dark pee during 6-8 hours. Get help right away if:  You have symptoms of   very bad dehydration.  You cannot drink fluids without throwing up (vomiting).  Your symptoms get worse with treatment.  You have a fever.  You have a very bad headache.  You are throwing up or having watery poop (diarrhea) and it: ? Gets worse. ? Does not go away.  You have blood or something green (bile) in your throw-up.  You have blood in your poop (stool). This may cause poop to look black and tarry.  You have not peed in 6-8 hours.  You pass out (faint).  Your heart rate when you are sitting still is more than 100 beats a  minute.  You have trouble breathing. This information is not intended to replace advice given to you by your health care provider. Make sure you discuss any questions you have with your health care provider. Document Released: 12/21/2008 Document Revised: 09/14/2015 Document Reviewed: 04/20/2015 Elsevier Interactive Patient Education  2018 [employer]  

## 2017-01-05 NOTE — Progress Notes (Signed)
83 year old female diagnosed with colon cancer on Xeloda.  Patient of Dr. Burr Medico.  Past medical history includes diverticulosis, GERD, hyperlipidemia, hypertension, and osteopenia.  Medications include vitamin D, multivitamin, Zofran.  Labs include potassium 3.2 and albumin 2.8.  Height: 4 feet 9 inches. Weight: 108.5 pounds October 29. Patient weighed 120 pounds in March 2018 BMI: 23.48.  I met with patient and her daughter in the exam room prior to receiving IV fluids. Patient reports nausea and poor appetite and Diarrhea. Patient was taking some Imodium.  However, not at maximum dosage. MD gave further instructions and prescription for Lomotil. Patient states she gets nausea with dairy foods however she is not allergic to dairy and does tolerate some cheese and pudding.  She does does not drink milk. She complains of fatigue and weakness. Patient is positive for 10% weight loss over 7 months. Physical exam was deferred.  Nutrition diagnosis:  Unintended weight loss related to nausea and poor appetite as evidenced by 12 pound weight loss since March 2018  Intervention: Patient educated to consume very small amounts of food frequently throughout the day.  I provided suggestions for snacks and gave her fact sheets to refer to at mealtimes. Recommended patient try oral nutrition supplements and provided samples and coupons. Provided strategies for improving nausea and poor appetite. Questions were answered.  Teach back method used.  Contact information given.  Monitoring, evaluation, goals:  Patient will tolerate increased calories and protein to minimize further weight loss.  Next visit: Patient will contact me by phone as needed.  **Disclaimer: This note was dictated with voice recognition software. Similar sounding words can inadvertently be transcribed and this note may contain transcription errors which may not have been corrected upon publication of note.**

## 2017-01-05 NOTE — Telephone Encounter (Signed)
Gave patient avs report and appointments for November.  °

## 2017-01-13 ENCOUNTER — Telehealth: Payer: Self-pay

## 2017-01-13 NOTE — Telephone Encounter (Signed)
Pt saw Dr Burr Medico on 10/29. She was dehydrated and received fluids. She is much better. She is still having diarrhea a couple of times per day.  It is watery. She is using her lomotil. She sees Dr Burr Medico on 11/12.  Instructed her to continue lomotil and she can use imodium inbetween.  Discussed gatorade, and water intake. She has just started eating more solid foods, since her visit on 10/29. Does she need to be tested for c-diff?

## 2017-01-13 NOTE — Telephone Encounter (Signed)
I agree with the RN instructions. No need to test c-diff for now since she is getting much better. Thanks   Truitt Merle MD

## 2017-01-16 NOTE — Progress Notes (Signed)
[facility]  [phone] [phone]  Clinic Follow up Note   Patient Care Team: Dettinger, [name], MD as PCP - General (Family Medicine) Clent Jacks, MD as Consulting Physician (Ophthalmology) Zadie Rhine Clent Demark, MD as Consulting Physician (Ophthalmology) Donnie Mesa, MD as Consulting Physician (General Surgery) Truitt Merle, MD as Consulting Physician (Hematology) 01/19/2017   CHIEF COMPLAINTS:  Follow up cancer of the right colon   Oncology History   Cancer Staging Cancer of right colon [facility]) Staging form: Colon and Rectum, AJCC 8th Edition - Pathologic stage from 10/14/2016: Stage IIC (pT4b, pN0, cM0) - Signed by Truitt Merle, MD on 11/07/2016       Cancer of right colon ([place])   09/19/2016 Imaging    CT A/P 09/19/16 IMPRESSION: Large 9.5 cm intraluminal mass in the cecum and ascending colon, consistent with colon carcinoma. Tumor extension into adjacent pericolonic fat seen along the lateral wall.  Mild right pericolonic and mesenteric lymphadenopathy, suspicious for metastatic disease. No other sites of metastatic disease identified.  Colonic diverticulosis, without radiographic evidence of diverticulitis. Tiny hiatal hernia, and small epigastric ventral hernia containing only fat.  Aortic atherosclerosis.      10/14/2016 Initial Diagnosis    Cancer of right colon ([place])      10/14/2016 Surgery    OPEN RIGHT HEMICOLECTOMY and REPAIR OF EPIGASTRIC VENTRAL HERNIA by Dr. Georgette Dover and Dr. Dalbert Batman       10/14/2016 Pathology Results    10/14/16 Diagnosis Colon, segmental resection for tumor, Right ADENOCARCINOMA WITH EXTENSIVE EXTRA CELLULAR MUCIN, GRADE 1, (11.0 CM) THE TUMOR INVADES THROUGH THE CECUM WALL INTO ADJACENT TERMINAL ILEUM (PT4B) NINETEEN BENIGN LYMPH NODES (0/19) SUPPURATIVE INFLAMMATION WITH FIBROSIS AND ADHESION TO ABDOMINAL WALL NO ADENOCARCINOMA IDENTIFIED      11/19/2016 Imaging    CT Chest WO Contrast  11/19/16 IMPRESSION: 1. No evidence of metastatic disease. 2. Aortic atherosclerosis (ICD10-170.0). Coronary artery calcification.      11/25/2016 -  Chemotherapy    Adjuvant Xeloda two weeks on, one week off starting 11/25/16 for 3 months        12/21/2016 Genetic Testing    Patient had genetic testing due to a personal history of colon cancer and a family history of breast, uterine, and colon cancer. The Common Hereditary Cancers Panel was ordered.  The Hereditary Gene Panel offered by [facility] includes sequencing and/or deletion duplication testing of the following 47 genes: APC, ATM, AXIN2, BARD1, BMPR1A, BRCA1, BRCA2, BRIP1, CDH1, CDKN2A (p14ARF), CDKN2A (p16INK4a), CHEK2, CDK4, CTNNA1, DICER1, EPCAM (Deletion/duplication testing only), GREM1 (promoter region deletion/duplication testing only), KIT, MEN1, MLH1, MSH2, MSH3, MSH6, MUTYH, NBN, NF1, NHTL1, PALB2, PDGFRA, PMS2, POLD1, POLE, PTEN, RAD50, RAD51C, RAD51D, SDHB, SDHC, SDHD, SMAD4, SMARCA4. STK11, TP53, TSC1, TSC2, and VHL.  The following genes were evaluated for sequence changes only: SDHA and HOXB13 c.251G>A variant only.  Results: Negative- no pathogenic variants identified.  The date of this test report is 12/21/2016.         HISTORY OF PRESENTING ILLNESS:  Ashley Peters 83 y.o. female is here because of cancer of the right colon. She presents with her daughter and husband. Initially, pt presented to her PCP's office on 09/19/16 for a 72mocheck-up and with complaints of worsening right lower quadrant pain that had been ongoing for several weeks. This was accompanied by fatigue, chills, and weight loss of approximately 30lbs over the past year. However, she reports that she was enrolled in a nutrition program for diabetics to help her  lose weight (she is only borderline diabetic). Her daughter was able to notice her fatigue as well. She had not noticed any bloody stools, however, she did notice some melena but was on iron  supplements for her anemia. She denies any significant constipation with this. She had also noticed a small area of distension over her area of pain in the right lower abdomen which she had not noticed previously. CT A/P was performed the same day which was remarkable for a large 9.5cm intraluminal mass in the cecum and ascending colon, consistent with colon carcinoma. There was tumor extension into the adjacent pericolonic fat seen along the lateral abdominal wall as well. Additionally, mild right pericolonic and mesenteric lymphadenopathy was also seen which was suspicious for metastatic disease. An epigastric ventral hernia was also found to be present on this scan.   Following these findings, she was subsequently referred to Dr Georgette Dover who performed both an open right hemicolectomy and epigastric ventral hernia repair on 10/14/16. She tolerated this procedure well and was monitored in the hospital until her discharge on 10/17/16, during which she recovered well. She was referred to medical oncology following her surgery and she has been recovering well since d/c. Since her surgery she denies any pain returning to the area. Her bowel movements have been normal and regular since her surgery and her appetite has been well. Her fatigue has also been improving and back to normal.   She currently lives alone in [place], [place] and very independent and active.    CURRENT THERAPY: Adjuvant Xeloda '1500mg'$  q12h,  two weeks on, one week off starting 11/25/16 for 3 months, held on 01/05/17 due to diarrhea and mild tachycardia.     INTERVAL HISTORY:  Ashley Peters is here for a follow up. She presents to the clinic today with her daughter. She notes 2 weeks ago she was sick. She has persistent diarrhea with mostly watery stool. This occurs mostly after she eats. She says Lomotil works best for her but she has run out and needs a refill. She did have nausea but not since last visit. She has been able to eat more. Before  chemo and after colon surgery her stool has been loose but no diarrhea. Her daughters wants to know is this C-diff. Patient reports to feeling much better today.  She notes her buttocks is sore from  hemorrhoids.    MEDICAL HISTORY:  Past Medical History:  Diagnosis Date  . Anemia   . Cancer ([place])   . Colon cancer ([place])   . Diverticulosis   . Family history of breast cancer   . Family history of colon cancer   . Family history of uterine cancer   . GERD (gastroesophageal reflux disease)   . History of kidney stones   . Hyperlipidemia   . Hypertension   . Macular degeneration   . Osteopenia     SURGICAL HISTORY: Past Surgical History:  Procedure Laterality Date  . ABDOMINAL HYSTERECTOMY    . ablation tr Great saphenous vein  10/17/2009  . BREAST EXCISIONAL BIOPSY Bilateral 1971  . brest biopsy  1971  . CHOLECYSTECTOMY    . lt. distal ureteral stone extraction  09/14/1991    SOCIAL HISTORY: Social History   Socioeconomic History  . Marital status: Widowed    Spouse name: Not on file  . Number of children: Not on file  . Years of education: Not on file  . Highest education level: Not on file  Social Needs  .  Financial resource strain: Not on file  . Food insecurity - worry: Not on file  . Food insecurity - inability: Not on file  . Transportation needs - medical: Not on file  . Transportation needs - non-medical: Not on file  Occupational History  . Not on file  Tobacco Use  . Smoking status: Never Smoker  . Smokeless tobacco: Never Used  Substance and Sexual Activity  . Alcohol use: No  . Drug use: No  . Sexual activity: Yes  Other Topics Concern  . Not on file  Social History Narrative  . Not on file    FAMILY HISTORY: Family History  Problem Relation Age of Onset  . Stroke Father   . Breast cancer Sister        dx 39's, died at 51  . Diabetes Brother   . Kidney disease Brother   . Heart disease Brother   . Epilepsy Son   . Colon cancer Other  53  . Uterine cancer Other 44  . Hodgkin's lymphoma Other     ALLERGIES:  is allergic to benicar [olmesartan medoxomil] and amoxicillin.  MEDICATIONS:  Current Outpatient Medications  Medication Sig Dispense Refill  . acetaminophen (TYLENOL) 500 MG tablet Take 2 tablets (1,000 mg total) by mouth every 6 (six) hours as needed for moderate pain. 30 tablet 0  . capecitabine (XELODA) 500 MG tablet Take 3 tablets (1,500 mg total) by mouth 2 (two) times daily after a meal. Take for 14 days on, 7 days off, repeat every 21 days 84 tablet 2  . cholecalciferol (VITAMIN D) 1000 units tablet Take 1,000 Units by mouth daily.    . ferrous sulfate 325 (65 FE) MG tablet Take 1 tablet (325 mg total) by mouth 2 (two) times daily with a meal. 60 tablet 3  . Multiple Vitamins-Minerals (PRESERVISION AREDS 2 PO) Take 1 capsule by mouth 2 (two) times daily.    . ondansetron (ZOFRAN) 8 MG tablet Take 1 tablet (8 mg total) by mouth every 8 (eight) hours as needed for nausea or vomiting. 30 tablet 2  . diphenoxylate-atropine (LOMOTIL) 2.5-0.025 MG tablet Take 1 tablet 4 (four) times daily as needed by mouth for diarrhea or loose stools. 60 tablet 0  . potassium chloride SA (K-DUR,KLOR-CON) 20 MEQ tablet Take 1 tablet (20 mEq total) by mouth 2 (two) times daily. (Patient not taking: Reported on 01/19/2017) 20 tablet 0   No current facility-administered medications for this visit.     REVIEW OF SYSTEMS:    Constitutional: Denies fevers, chills or abnormal night sweats  (+) improved appetite  Eyes: Denies blurriness of vision, double vision or watery eyes Ears, nose, mouth, throat, and face: Denies mucositis or sore throat Respiratory: Denies cough, dyspnea or wheezes Cardiovascular: Denies palpitation, chest discomfort or lower extremity swelling Gastrointestinal:  Denies heartburn (+) Diarrhea (+) hemorrhoids  Skin: Denies abnormal skin rashes (+) redness and dryness of palms and soles of feet Lymphatics: Denies  new lymphadenopathy or easy bruising Neurological:Denies numbness, tingling or new weaknesses Behavioral/Psych: Mood is stable, no new changes  All other systems were reviewed with the patient and are negative.  PHYSICAL EXAMINATION:  ECOG PERFORMANCE STATUS: 2  Vitals:   01/19/17 0803  BP: (!) 116/51  Pulse: 91  Resp: 16  Temp: 97.8 F (36.6 C)  SpO2: 100%   Filed Weights   01/19/17 0803  Weight: 104 lb 12.8 oz (47.5 kg)    GENERAL:alert, no distress and comfortable SKIN: skin color, texture, turgor  are normal, no rashes or significant lesions EYES: normal, conjunctiva are pink and non-injected, sclera clear OROPHARYNX:no exudate, no erythema and lips, buccal mucosa, and tongue normal  NECK: supple, thyroid normal size, non-tender, without nodularity LYMPH:  no palpable lymphadenopathy in the cervical, axillary or inguinal LUNGS: clear to auscultation and percussion with normal breathing effort HEART: regular rhythm and no murmurs and no lower extremity edema (+) mild tachycardia  ABDOMEN:abdomen soft, non-tender and normal bowel sounds. Midline surgical incision has healed well. Musculoskeletal:no cyanosis of digits and no clubbing  PSYCH: alert & oriented x 3 with fluent speech NEURO: no focal motor/sensory deficits  LABORATORY DATA:  I have reviewed the data as listed CBC Latest Ref Rng & Units 01/19/2017 01/05/2017 12/15/2016  WBC 3.9 - 10.3 10e3/uL 6.9 6.4 7.1  Hemoglobin 11.6 - 15.9 g/dL 15.2 16.6(H) 13.5  Hematocrit 34.8 - 46.6 % 46.7(H) 48.9(H) 41.5  Platelets 145 - 400 10e3/uL 253 375 217   PATHOLOGY REPORTS:   10/14/16 Diagnosis Colon, segmental resection for tumor, Right ADENOCARCINOMA WITH EXTENSIVE EXTRA CELLULAR MUCIN, GRADE 1, (11.0 CM) THE TUMOR INVADES THROUGH THE CECUM WALL INTO ADJACENT TERMINAL ILEUM (PT4B) NINETEEN BENIGN LYMPH NODES (0/19) SUPPURATIVE INFLAMMATION WITH FIBROSIS AND ADHESION TO ABDOMINAL WALL NO ADENOCARCINOMA  IDENTIFIED Microscopic Comment COLON AND RECTUM (INCLUDING TRANS-ANAL RESECTION): Specimen: Right colon with terminal ileum Procedure: Segmental resection Tumor site: Cecum and terminal ileum Specimen integrity: Intact Macroscopic intactness of mesorectum: Not applicable: X Complete: NA Near complete: NA Incomplete: NA Cannot be determined (specify): NA Macroscopic tumor perforation: Invades adjacent structure Invasive tumor: Maximum size: 11.0 cm Histologic type(s): Adenocarcinoma with extracellular mucin Histologic grade and differentiation: G1: well differentiated/low grade G2: moderately differentiated/low grade G3: poorly differentiated/high grade G4: undifferentiated/high grade Type of polyp in which invasive carcinoma arose: Tubular adenoma Microscopic extension of invasive tumor: Invades adjacent terminal ileum 1 of 5 Supplemental copy SUPPLEMENTAL for HANNIE, SHOE ([id number]) Microscopic Comment(continued) Lymph-Vascular invasion: Negative Peri-neural invasion: Negative Tumor deposit(s) (discontinuous extramural extension): Negative Resection margins: Proximal margin: Negative Distal margin: Negative Circumferential (radial) (posterior ascending, posterior descending; lateral and posterior mid-rectum; and entire lower 1/3 rectum):NA Mesenteric margin (sigmoid and transverse): NA Distance closest margin (if all above margins negative): 1.5 cm from abdominal wall adhesion resection Trans-anal resection margins only: Deep margin: NA Mucosal Margin: NA Distance closest mucosal margin (if negative): NA Treatment effect (neo-adjuvant therapy): Negative Additional polyp(s): Negative Non-neoplastic findings: Unremarkable Lymph nodes: number examined 19; number positive: 0 Pathologic Staging: pT4b, pN0, pMx Ancillary studies: Ordered  RADIOGRAPHIC STUDIES: I have personally reviewed the radiological images as listed and agreed with the findings in the  report.  Mm Screening Breast Tomo Bilateral  Result Date: 12/25/2016 CLINICAL DATA:  Screening. EXAM: 2D DIGITAL SCREENING BILATERAL MAMMOGRAM WITH CAD AND ADJUNCT TOMO COMPARISON:  Previous exam(s). ACR Breast Density Category c: The breast tissue is heterogeneously dense, which may obscure small masses. FINDINGS: There are no findings suspicious for malignancy. Images were processed with CAD. IMPRESSION: No mammographic evidence of malignancy. A result letter of this screening mammogram will be mailed directly to the patient. RECOMMENDATION: Screening mammogram in one year. (Code:SM-B-01Y) BI-RADS CATEGORY  1: Negative. Electronically Signed   By: Margarette Canada M.D.   On: 12/25/2016 13:18     CT A/P 09/19/16 IMPRESSION: Large 9.5 cm intraluminal mass in the cecum and ascending colon, consistent with colon carcinoma. Tumor extension into adjacent pericolonic fat seen along the lateral wall.  Mild right pericolonic and mesenteric lymphadenopathy, suspicious for metastatic  disease. No other sites of metastatic disease identified.  Colonic diverticulosis, without radiographic evidence of diverticulitis. Tiny hiatal hernia, and small epigastric ventral hernia containing only fat.  Aortic atherosclerosis.  ASSESSMENT: 83 y.o. 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 She was found to have right-sided colon cancer beginning in the cecum. She did have an impressively large at 11cm tumor which invaded around the adjacent small intestine which was removed without complication by Dr Georgette Dover. Her surgical margins were negative. All 19 lymph nodes were negative. -I reviewed her staging CT abd/pel scan and chest, which was negative for metastatic disease.  -She had stage II disease, early-stage but with high risk features, because of her T4b tumor  - I  discussed her high recurrence risk due to T4b lesion, especially local recurrence  -Given her high risk of recurrence, I recommend her to consider adjuvant Xeloda. She is 56, but a very healthy and functional, she may be able tolerate Xeloda well. Due to her advanced age, I recommend her to take Xeloda for 3 months, instead of standard 6 months therapy.  -She has started adjuvant Xeloda on 11/25/16, tolerated her first cycle very well, we will continue  -She did not tolerated cycle 2 Xeloda well due to diarrhea and nausea. She is dehydrated and is mildly tachycardic. She was given IV fluids and prescribed lomotil.  -Held Xeloda since 01/05/17 to give time to recover. Her diarrhea has improved but not resolved. If she recovers well in the next 1-2 weeks, may consider 1-2 more cycles of Xeloda with dose reduction  -Given her age, I have a low threshold to stop chemotherapy treatment.  -continue chemo break and F/u in 3 weeks    2.) Genetics -Due to her prevalent family history of cancer, I suggested that she should follow up with genetics for testing. She would like to have this performed.  -She does have two sons and one daughter, I also informed her that if her genetic testing were to come back with abnormal results that her children should be tested as well.  -Also advised that her children should have routine colonoscopies.  -She completed genetic testing 12/03/16 and her results are negative  3.) Anemia  -We will monitor her Hgb and Iron levels. If these continue to worsen or do not improve, we will consider infusion to aid with this.  -I encouraged her to continue taking her iron supplements at home.   -We will monitor this with her lab work.  -As of 12/03/16 her Hg is 13.5, and Ferritin is normal at 61.  -Hg remains resolved at 13.5 (12/15/16) -She will continue iron supplement for one more month and then she can stop.  -She is currently off oral iron   4.) Borderline DM and HTN: -She was  on BP medication prior to her weight loss, she was taken off of this following her weight loss.   -Continue with weight loss plan for management and I encouraged her to maintain a good diet to manage her sugars.  -She will continue f/u w/ her PCP for this.   5) Diarrhea, Nausea, hemorrhoids  -secondary to Xeloda -I recommend she takes 3-4 imodium when she notices more than 2 watery stool a day. She can take up to 4-6 tablets in a day. I will prescribe lomotil and if the imodium does not work she  can take 1-2 lomotil.  -I suggest she take anti-emetics 30 minutes before eating -IVF given 01/05/17 -Nausea has resolved, but she continues to have diarrhea with watery stool. I will refill her Lomotil today.  -Clinically her symptoms are not concurrent with C-diff but if her diarrhea worsens or does not resolve she can turn in a stool sample.  -I suggest a Sitz bath and Desitin for her hemorrhoids and rectal discomfort from diarrhea   6) Weight loss, Low appetite, Abdominal pain -I encouraged her to take ensure boosts to help supplement her food intake -I suggest she avoid foods that may irritate her stomach  -I encouraged her taking nutritional supplement. I spoke with dietitian Pamala Hurry and she met with patient that same day. -Her appetite has improved, but has not gained weight back   7) Hand-foot syndrome  -Secondary to Xeloda  -She uses Eucerin and Hydrocortisone, it's resolved now   PLAN:  -Refill lomotil today  -Lab and f/u in 3 weeks  -Stool test for C-diff if she still has loose BM in the next few days.      No orders of the defined types were placed in this encounter.   All questions were answered. The patient knows to call the clinic with any problems, questions or concerns.   This document serves as a record of services personally performed by Truitt Merle, MD. It was created on her behalf by Joslyn Devon, a trained medical scribe. The creation of this record is based on the  scribe's personal observations and the provider's statements to them.    I have reviewed the above documentation for accuracy and completeness, and I agree with the above.     Truitt Merle, MD 01/19/2017 11:55 AM

## 2017-01-19 ENCOUNTER — Encounter: Payer: Self-pay | Admitting: Hematology

## 2017-01-19 ENCOUNTER — Telehealth: Payer: Self-pay | Admitting: Hematology

## 2017-01-19 ENCOUNTER — Other Ambulatory Visit: Payer: Self-pay | Admitting: *Deleted

## 2017-01-19 ENCOUNTER — Ambulatory Visit (HOSPITAL_BASED_OUTPATIENT_CLINIC_OR_DEPARTMENT_OTHER): Payer: Medicare Other | Admitting: Hematology

## 2017-01-19 ENCOUNTER — Other Ambulatory Visit (HOSPITAL_BASED_OUTPATIENT_CLINIC_OR_DEPARTMENT_OTHER): Payer: Medicare Other

## 2017-01-19 VITALS — BP 116/51 | HR 91 | Temp 97.8°F | Resp 16 | Ht <= 58 in | Wt 104.8 lb

## 2017-01-19 DIAGNOSIS — R110 Nausea: Secondary | ICD-10-CM | POA: Diagnosis not present

## 2017-01-19 DIAGNOSIS — R197 Diarrhea, unspecified: Secondary | ICD-10-CM | POA: Diagnosis not present

## 2017-01-19 DIAGNOSIS — C182 Malignant neoplasm of ascending colon: Secondary | ICD-10-CM

## 2017-01-19 DIAGNOSIS — E119 Type 2 diabetes mellitus without complications: Secondary | ICD-10-CM

## 2017-01-19 DIAGNOSIS — R630 Anorexia: Secondary | ICD-10-CM | POA: Diagnosis not present

## 2017-01-19 DIAGNOSIS — D649 Anemia, unspecified: Secondary | ICD-10-CM | POA: Diagnosis not present

## 2017-01-19 DIAGNOSIS — R109 Unspecified abdominal pain: Secondary | ICD-10-CM | POA: Diagnosis not present

## 2017-01-19 DIAGNOSIS — K649 Unspecified hemorrhoids: Secondary | ICD-10-CM | POA: Diagnosis not present

## 2017-01-19 DIAGNOSIS — R634 Abnormal weight loss: Secondary | ICD-10-CM

## 2017-01-19 DIAGNOSIS — D500 Iron deficiency anemia secondary to blood loss (chronic): Secondary | ICD-10-CM

## 2017-01-19 DIAGNOSIS — I10 Essential (primary) hypertension: Secondary | ICD-10-CM

## 2017-01-19 LAB — CBC & DIFF AND RETIC
BASO%: 0.9 % (ref 0.0–2.0)
Basophils Absolute: 0.1 10*3/uL (ref 0.0–0.1)
EOS%: 1.5 % (ref 0.0–7.0)
Eosinophils Absolute: 0.1 10*3/uL (ref 0.0–0.5)
HCT: 46.7 % — ABNORMAL HIGH (ref 34.8–46.6)
HGB: 15.2 g/dL (ref 11.6–15.9)
IMMATURE RETIC FRACT: 1 % — AB (ref 1.60–10.00)
LYMPH%: 20 % (ref 14.0–49.7)
MCH: 29.1 pg (ref 25.1–34.0)
MCHC: 32.5 g/dL (ref 31.5–36.0)
MCV: 89.5 fL (ref 79.5–101.0)
MONO#: 0.5 10*3/uL (ref 0.1–0.9)
MONO%: 7.1 % (ref 0.0–14.0)
NEUT#: 4.8 10*3/uL (ref 1.5–6.5)
NEUT%: 70.5 % (ref 38.4–76.8)
PLATELETS: 253 10*3/uL (ref 145–400)
RBC: 5.22 10*6/uL (ref 3.70–5.45)
RDW: 19.2 % — ABNORMAL HIGH (ref 11.2–14.5)
Retic %: 1.1 % (ref 0.70–2.10)
Retic Ct Abs: 57.42 10*3/uL (ref 33.70–90.70)
WBC: 6.9 10*3/uL (ref 3.9–10.3)
lymph#: 1.4 10*3/uL (ref 0.9–3.3)

## 2017-01-19 LAB — COMPREHENSIVE METABOLIC PANEL
ALBUMIN: 2.3 g/dL — AB (ref 3.5–5.0)
ALK PHOS: 62 U/L (ref 40–150)
ALT: 26 U/L (ref 0–55)
ANION GAP: 5 meq/L (ref 3–11)
AST: 41 U/L — ABNORMAL HIGH (ref 5–34)
BILIRUBIN TOTAL: 0.5 mg/dL (ref 0.20–1.20)
BUN: 17.2 mg/dL (ref 7.0–26.0)
CO2: 28 mEq/L (ref 22–29)
Calcium: 8.5 mg/dL (ref 8.4–10.4)
Chloride: 109 mEq/L (ref 98–109)
Creatinine: 0.7 mg/dL (ref 0.6–1.1)
Glucose: 90 mg/dl (ref 70–140)
POTASSIUM: 4.6 meq/L (ref 3.5–5.1)
Sodium: 141 mEq/L (ref 136–145)
Total Protein: 4.9 g/dL — ABNORMAL LOW (ref 6.4–8.3)

## 2017-01-19 LAB — IRON AND TIBC
%SAT: 38 % (ref 21–57)
IRON: 84 ug/dL (ref 41–142)
TIBC: 221 ug/dL — AB (ref 236–444)
UIBC: 138 ug/dL (ref 120–384)

## 2017-01-19 LAB — CEA (IN HOUSE-CHCC): CEA (CHCC-IN HOUSE): 6.88 ng/mL — AB (ref 0.00–5.00)

## 2017-01-19 MED ORDER — DIPHENOXYLATE-ATROPINE 2.5-0.025 MG PO TABS: 1.0000 | ORAL_TABLET | Freq: Four times a day (QID) | ORAL | 0 refills | Status: DC | PRN

## 2017-01-19 NOTE — Telephone Encounter (Signed)
Scheduled appt per 11/12 los - Gave patient AVS and calender per los . Patient to get stool kit from Lab after scheduling.

## 2017-01-25 ENCOUNTER — Other Ambulatory Visit: Payer: Self-pay | Admitting: Family Medicine

## 2017-01-28 ENCOUNTER — Telehealth: Payer: Self-pay

## 2017-01-28 NOTE — Telephone Encounter (Signed)
Nutrition Follow-up:  Patient identified on Malnutrition Screening Report for weight loss.  Patient with colon cancer on xeloda and followed by Dr. Burr Medico.  Last seen by RD on 01/05/17.  Patient having increased diarrhea, nausea, poor appetite and dehydration at that time.    Spoke with patient via phone today and patient reports appetite has improved. Reports that she has not had anymore diarrhea in the past week.  Reports that she tried ensure supplements and has drank them but does not really like them.  Reports that she usually eats a good breakfast (egg, toast), lunch and snacks some during the day and light supper).  Probed to ask what exactly she eats a meal times and patient just kept saying that she is eating better.    Medications: reviewed  Labs: reviewed  Anthropometrics:   Weight has decreased to 104 lb 12.8 oz on 11/12 from 108 lb 8 oz on 10/29.   NUTRITION DIAGNOSIS: Unintentional weight loss continues   INTERVENTION:   Encouraged patient to continue to consume high calorie, high protein foods during the day (examples provided) Encouraged patient to continue with small frequent meals Encouraged patient to continue to drink oral nutrition supplements to provided additional calories to prevent further weight loss Patient verbalized that she has contact information    MONITORING, EVALUATION, GOAL: Patient will tolerate increased calories and protein to minimize further weight loss.   NEXT VISIT: Patient to contact as needed.  Frannie Shedrick B. Zenia Resides, [place], [place] Registered Dietitian [phone] (pager)

## 2017-02-06 NOTE — Progress Notes (Signed)
[place]  [phone] [phone]  Clinic Follow up Note   Patient Care Team: Dettinger, [name], MD as PCP - General (Family Medicine) Clent Jacks, MD as Consulting Physician (Ophthalmology) Zadie Rhine Clent Demark, MD as Consulting Physician (Ophthalmology) Donnie Mesa, MD as Consulting Physician (General Surgery) Truitt Merle, MD as Consulting Physician (Hematology) 02/09/2017   CHIEF COMPLAINTS:  Follow up cancer of the right colon   Oncology History   Cancer Staging Cancer of right colon [facility]) Staging form: Colon and Rectum, AJCC 8th Edition - Pathologic stage from 10/14/2016: Stage IIC (pT4b, pN0, cM0) - Signed by Truitt Merle, MD on 11/07/2016       Cancer of right colon ([place])   09/19/2016 Imaging    CT A/P 09/19/16 IMPRESSION: Large 9.5 cm intraluminal mass in the cecum and ascending colon, consistent with colon carcinoma. Tumor extension into adjacent pericolonic fat seen along the lateral wall.  Mild right pericolonic and mesenteric lymphadenopathy, suspicious for metastatic disease. No other sites of metastatic disease identified.  Colonic diverticulosis, without radiographic evidence of diverticulitis. Tiny hiatal hernia, and small epigastric ventral hernia containing only fat.  Aortic atherosclerosis.      10/14/2016 Initial Diagnosis    Cancer of right colon ([place])      10/14/2016 Surgery    OPEN RIGHT HEMICOLECTOMY and REPAIR OF EPIGASTRIC VENTRAL HERNIA by Dr. Georgette Dover and Dr. Dalbert Batman       10/14/2016 Pathology Results    10/14/16 Diagnosis Colon, segmental resection for tumor, Right ADENOCARCINOMA WITH EXTENSIVE EXTRA CELLULAR MUCIN, GRADE 1, (11.0 CM) THE TUMOR INVADES THROUGH THE CECUM WALL INTO ADJACENT TERMINAL ILEUM (PT4B) NINETEEN BENIGN LYMPH NODES (0/19) SUPPURATIVE INFLAMMATION WITH FIBROSIS AND ADHESION TO ABDOMINAL WALL NO ADENOCARCINOMA IDENTIFIED      11/19/2016 Imaging    CT Chest WO Contrast  11/19/16 IMPRESSION: 1. No evidence of metastatic disease. 2. Aortic atherosclerosis (ICD10-170.0). Coronary artery calcification.      11/25/2016 -  Chemotherapy    Adjuvant Xeloda two weeks on, one week off starting 11/25/16 for 3 months        12/21/2016 Genetic Testing    Patient had genetic testing due to a personal history of colon cancer and a family history of breast, uterine, and colon cancer. The Common Hereditary Cancers Panel was ordered.  The Hereditary Gene Panel offered by [facility] includes sequencing and/or deletion duplication testing of the following 47 genes: APC, ATM, AXIN2, BARD1, BMPR1A, BRCA1, BRCA2, BRIP1, CDH1, CDKN2A (p14ARF), CDKN2A (p16INK4a), CHEK2, CDK4, CTNNA1, DICER1, EPCAM (Deletion/duplication testing only), GREM1 (promoter region deletion/duplication testing only), KIT, MEN1, MLH1, MSH2, MSH3, MSH6, MUTYH, NBN, NF1, NHTL1, PALB2, PDGFRA, PMS2, POLD1, POLE, PTEN, RAD50, RAD51C, RAD51D, SDHB, SDHC, SDHD, SMAD4, SMARCA4. STK11, TP53, TSC1, TSC2, and VHL.  The following genes were evaluated for sequence changes only: SDHA and HOXB13 c.251G>A variant only.  Results: Negative- no pathogenic variants identified.  The date of this test report is 12/21/2016.         HISTORY OF PRESENTING ILLNESS:  Ashley Peters 83 y.o. female is here because of cancer of the right colon. She presents with her daughter and husband. Initially, pt presented to her PCP's office on 09/19/16 for a 41mocheck-up and with complaints of worsening right lower quadrant pain that had been ongoing for several weeks. This was accompanied by fatigue, chills, and weight loss of approximately 30lbs over the past year. However, she reports that she was enrolled in a nutrition program for diabetics to help her  lose weight (she is only borderline diabetic). Her daughter was able to notice her fatigue as well. She had not noticed any bloody stools, however, she did notice some melena but was on iron  supplements for her anemia. She denies any significant constipation with this. She had also noticed a small area of distension over her area of pain in the right lower abdomen which she had not noticed previously. CT A/P was performed the same day which was remarkable for a large 9.5cm intraluminal mass in the cecum and ascending colon, consistent with colon carcinoma. There was tumor extension into the adjacent pericolonic fat seen along the lateral abdominal wall as well. Additionally, mild right pericolonic and mesenteric lymphadenopathy was also seen which was suspicious for metastatic disease. An epigastric ventral hernia was also found to be present on this scan.   Following these findings, she was subsequently referred to Dr Georgette Dover who performed both an open right hemicolectomy and epigastric ventral hernia repair on 10/14/16. She tolerated this procedure well and was monitored in the hospital until her discharge on 10/17/16, during which she recovered well. She was referred to medical oncology following her surgery and she has been recovering well since d/c. Since her surgery she denies any pain returning to the area. Her bowel movements have been normal and regular since her surgery and her appetite has been well. Her fatigue has also been improving and back to normal.   She currently lives alone in [place], [place] and very independent and active.    CURRENT THERAPY: Adjuvant Xeloda 1541m q12h,  two weeks on, one week off starting 11/25/16 for 3 months, held on 01/05/17 due to diarrhea and mild tachycardia. Restarted on 02/10/17 1 week on and 1 week off with 2 tabs BID.    INTERVAL HISTORY:   JBRYNJA MARKERis here for a follow up. She presents to the clinic today with her son. She notes she has started gaining her weight back. She is eating normally. She notes her feet is still peeling. She also notes her ankles are swelling bilaterally. She has had this happened occasionally for years.    MEDICAL  HISTORY:  Past Medical History:  Diagnosis Date  . Anemia   . [place]   . [place]   . Diverticulosis   . Family history of breast cancer   . Family history of colon cancer   . Family history of uterine cancer   . GERD (gastroesophageal reflux disease)   . History of kidney stones   . Hyperlipidemia   . Hypertension   . Macular degeneration   . Osteopenia     SURGICAL HISTORY: Past Surgical History:  Procedure Laterality Date  . ABDOMINAL HYSTERECTOMY    . ablation tr Great saphenous vein  10/17/2009  . BREAST EXCISIONAL BIOPSY Bilateral 1971  . brest biopsy  1971  . CHOLECYSTECTOMY    . EPIGASTRIC HERNIA REPAIR N/A 10/14/2016   Procedure: REPAIR OF EPIGASTRIC VENTRAL HERNIA;  Surgeon: TDonnie Mesa MD;  [place]  Service: General;  Laterality: N/A;  . lt. distal ureteral stone extraction  09/14/1991  . PARTIAL COLECTOMY Right 10/14/2016   Procedure: OPEN RIGHT HEMICOLECTOMY;  Surgeon: TDonnie Mesa MD;  [place]  Service: General;  Laterality: Right;    SOCIAL HISTORY: Social History   Socioeconomic History  . Marital status: Widowed    Spouse name: Not on file  . Number of children: Not on file  . Years of education: Not  on file  . Highest education level: Not on file  Social Needs  . Financial resource strain: Not on file  . Food insecurity - worry: Not on file  . Food insecurity - inability: Not on file  . Transportation needs - medical: Not on file  . Transportation needs - non-medical: Not on file  Occupational History  . Not on file  Tobacco Use  . Smoking status: Never Smoker  . Smokeless tobacco: Never Used  Substance and Sexual Activity  . Alcohol use: No  . Drug use: No  . Sexual activity: Yes  Other Topics Concern  . Not on file  Social History Narrative  . Not on file    FAMILY HISTORY: Family History  Problem Relation Age of Onset  . Stroke Father   . Breast cancer Sister        dx 2's, died at 63  .  Diabetes Brother   . Kidney disease Brother   . Heart disease Brother   . Epilepsy Son   . Colon cancer Other 5  . Uterine cancer Other 68  . Hodgkin's lymphoma Other     ALLERGIES:  is allergic to benicar [olmesartan medoxomil] and amoxicillin.  MEDICATIONS:  Current Outpatient Medications  Medication Sig Dispense Refill  . acetaminophen (TYLENOL) 500 MG tablet Take 2 tablets (1,000 mg total) by mouth every 6 (six) hours as needed for moderate pain. 30 tablet 0  . capecitabine (XELODA) 500 MG tablet Take 3 tablets (1,500 mg total) by mouth 2 (two) times daily after a meal. Take for 14 days on, 7 days off, repeat every 21 days 84 tablet 2  . cholecalciferol (VITAMIN D) 1000 units tablet Take 1,000 Units by mouth daily.    . diphenoxylate-atropine (LOMOTIL) 2.5-0.025 MG tablet Take 1 tablet 4 (four) times daily as needed by mouth for diarrhea or loose stools. 60 tablet 0  . ferrous sulfate 325 (65 FE) MG tablet TAKE 1 TABLET (325 MG TOTAL) BY MOUTH 2 (TWO) TIMES DAILY WITH A MEAL. 60 tablet 0  . Multiple Vitamins-Minerals (PRESERVISION AREDS 2 PO) Take 1 capsule by mouth 2 (two) times daily.    . ondansetron (ZOFRAN) 8 MG tablet Take 1 tablet (8 mg total) by mouth every 8 (eight) hours as needed for nausea or vomiting. 30 tablet 2  . potassium chloride SA (K-DUR,KLOR-CON) 20 MEQ tablet Take 1 tablet (20 mEq total) by mouth 2 (two) times daily. (Patient not taking: Reported on 01/19/2017) 20 tablet 0   No current facility-administered medications for this visit.     REVIEW OF SYSTEMS:    Constitutional: Denies fevers, chills or abnormal night sweats  (+) improved appetite, weight gain Eyes: Denies blurriness of vision, double vision or watery eyes Ears, nose, mouth, throat, and face: Denies mucositis or sore throat Respiratory: Denies cough, dyspnea or wheezes Cardiovascular: Denies palpitation, chest discomfort (+) lower extremity swelling of ankles  Gastrointestinal:  Denies  heartburn  (+) hemorrhoids  Skin: Denies abnormal skin rashes (+) peeling on soles of feet Lymphatics: Denies new lymphadenopathy or easy bruising Neurological:Denies numbness, tingling or new weaknesses Behavioral/Psych: Mood is stable, no new changes  All other systems were reviewed with the patient and are negative.  PHYSICAL EXAMINATION:  ECOG PERFORMANCE STATUS: 2  Vitals:   02/09/17 0901  BP: (!) 165/69  Pulse: 81  Resp: 17  Temp: 98.3 F (36.8 C)  SpO2: 98%   Filed Weights   02/09/17 0901  Weight: 118 lb 12.8 oz (53.9  kg)    GENERAL:alert, no distress and comfortable SKIN: skin color, texture, turgor are normal, no rashes or significant lesions EYES: normal, conjunctiva are pink and non-injected, sclera clear OROPHARYNX:no exudate, no erythema and lips, buccal mucosa, and tongue normal  NECK: supple, thyroid normal size, non-tender, without nodularity LYMPH:  no palpable lymphadenopathy in the cervical, axillary or inguinal LUNGS: clear to auscultation and percussion with normal breathing effort HEART: regular rhythm and no murmurs (+) mild pitting edema on both lower extremity up to mid calf  ABDOMEN:abdomen soft, non-tender and normal bowel sounds. Midline surgical incision has healed well. Musculoskeletal:no cyanosis of digits and no clubbing  PSYCH: alert & oriented x 3 with fluent speech NEURO: no focal motor/sensory deficits  LABORATORY DATA:  I have reviewed the data as listed CBC Latest Ref Rng & Units 02/09/2017 01/19/2017 01/05/2017  WBC 3.9 - 10.3 10e3/uL 6.0 6.9 6.4  Hemoglobin 11.6 - 15.9 g/dL 13.0 15.2 16.6(H)  Hematocrit 34.8 - 46.6 % 40.9 46.7(H) 48.9(H)  Platelets 145 - 400 10e3/uL 225 253 375   CMP Latest Ref Rng & Units 02/09/2017 01/19/2017 01/05/2017  Glucose 70 - 140 mg/dl 87 90 124  BUN 7.0 - 26.0 mg/dL 10.4 17.2 16.9  Creatinine 0.6 - 1.1 mg/dL 0.7 0.7 0.8  Sodium 136 - 145 mEq/L 143 141 138  Potassium 3.5 - 5.1 mEq/L 3.9 4.6 3.2(L)   Chloride 101 - 111 mmol/L - - -  CO2 22 - 29 mEq/L '26 28 23  ' Calcium 8.4 - 10.4 mg/dL 8.8 8.5 9.0  Total Protein 6.4 - 8.3 g/dL 5.6(L) 4.9(L) 5.8(L)  Total Bilirubin 0.20 - 1.20 mg/dL 0.61 0.50 0.57  Alkaline Phos 40 - 150 U/L 64 62 45  AST 5 - 34 U/L 19 41(H) 11  ALT 0 - 55 U/L '13 26 13    ' PATHOLOGY REPORTS:   10/14/16 Diagnosis Colon, segmental resection for tumor, Right ADENOCARCINOMA WITH EXTENSIVE EXTRA CELLULAR MUCIN, GRADE 1, (11.0 CM) THE TUMOR INVADES THROUGH THE CECUM WALL INTO ADJACENT TERMINAL ILEUM (PT4B) NINETEEN BENIGN LYMPH NODES (0/19) SUPPURATIVE INFLAMMATION WITH FIBROSIS AND ADHESION TO ABDOMINAL WALL NO ADENOCARCINOMA IDENTIFIED Microscopic Comment COLON AND RECTUM (INCLUDING TRANS-ANAL RESECTION): Specimen: Right colon with terminal ileum Procedure: Segmental resection Tumor site: Cecum and terminal ileum Specimen integrity: Intact Macroscopic intactness of mesorectum: Not applicable: X Complete: NA Near complete: NA Incomplete: NA Cannot be determined (specify): NA Macroscopic tumor perforation: Invades adjacent structure Invasive tumor: Maximum size: 11.0 cm Histologic type(s): Adenocarcinoma with extracellular mucin Histologic grade and differentiation: G1: well differentiated/low grade G2: moderately differentiated/low grade G3: poorly differentiated/high grade G4: undifferentiated/high grade Type of polyp in which invasive carcinoma arose: Tubular adenoma Microscopic extension of invasive tumor: Invades adjacent terminal ileum 1 of 5 Supplemental copy SUPPLEMENTAL for DUYEN, BECKOM ([id number]) Microscopic Comment(continued) Lymph-Vascular invasion: Negative Peri-neural invasion: Negative Tumor deposit(s) (discontinuous extramural extension): Negative Resection margins: Proximal margin: Negative Distal margin: Negative Circumferential (radial) (posterior ascending, posterior descending; lateral and posterior mid-rectum; and entire  lower 1/3 rectum):NA Mesenteric margin (sigmoid and transverse): NA Distance closest margin (if all above margins negative): 1.5 cm from abdominal wall adhesion resection Trans-anal resection margins only: Deep margin: NA Mucosal Margin: NA Distance closest mucosal margin (if negative): NA Treatment effect (neo-adjuvant therapy): Negative Additional polyp(s): Negative Non-neoplastic findings: Unremarkable Lymph nodes: number examined 19; number positive: 0 Pathologic Staging: pT4b, pN0, pMx Ancillary studies: Ordered  RADIOGRAPHIC STUDIES: I have personally reviewed the radiological images as listed and agreed with the findings in  the report.  No results found.   CT A/P 09/19/16 IMPRESSION: Large 9.5 cm intraluminal mass in the cecum and ascending colon, consistent with colon carcinoma. Tumor extension into adjacent pericolonic fat seen along the lateral wall.  Mild right pericolonic and mesenteric lymphadenopathy, suspicious for metastatic disease. No other sites of metastatic disease identified.  Colonic diverticulosis, without radiographic evidence of diverticulitis. Tiny hiatal hernia, and small epigastric ventral hernia containing only fat.  Aortic atherosclerosis.  ASSESSMENT: 83 y.o. 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 She was found to have right-sided colon cancer beginning in the cecum. She did have an impressively large at 11cm tumor which invaded around the adjacent small intestine which was removed without complication by Dr Georgette Dover. Her surgical margins were negative. All 19 lymph nodes were negative. -I reviewed her staging CT abd/pel scan and chest, which was negative for metastatic disease.  -She had stage II disease, early-stage but with high risk features, because of her T4b tumor  - I discussed her high  recurrence risk due to T4b lesion, especially local recurrence  -Given her high risk of recurrence, I recommend her to consider adjuvant Xeloda. She is 12, but a very healthy and functional, she may be able tolerate Xeloda well. Due to her advanced age, I recommend her to take Xeloda for 3 months, instead of standard 6 months therapy.  -She has started adjuvant Xeloda on 11/25/16, tolerated her first cycle very well -She did not tolerated cycle 2 Xeloda well due to diarrhea and nausea. She was dehydrated and mildly tachycardic. She was given IV fluids and prescribed lomotil.  -Held Xeloda since 01/05/17 to give time to recover. She has recovered completely now  -Given her age, I have a low threshold to stop chemotherapy treatment.  -I discussed the options of continuing chemo or stopping completely. I disused her current risk of recurrence is 30-40%. After a long discussion she prefers to continue treatment at low dose to lower her risk of recurrence. I suggest reduce Xeloda to 1024m BID  For 1 week on and 1 weeks off. She agreed.  -If her doppler returns normal she can start Xeloda tomorrow.  -F/u in 2 weeks.    2.) Genetics -Due to her prevalent family history of cancer, I suggested that she should follow up with genetics for testing. She would like to have this performed.  -She does have two sons and one daughter, I also informed her that if her genetic testing were to come back with abnormal results that her children should be tested as well.  -Also advised that her children should have routine colonoscopies.  -She completed genetic testing 12/03/16 and her results are negative  3.) Anemia  -We will monitor her Hgb and Iron levels. If these continue to worsen or do not improve, we will consider infusion to aid with this.  -I encouraged her to continue taking her iron supplements at home.   -We will monitor this with her lab work.  -As of 12/03/16 her Hg is 13.5, and Ferritin is normal at 61.   -Hg resolved at 13.5 as of 12/15/16 -She will continue iron supplement for one more month and then she can stop.  -She is currently off oral iron   4.) Borderline DM and HTN: -She was on BP medication prior to her weight loss, she was taken off  of this following her weight loss.   -Continue with weight loss plan for management and I encouraged her to maintain a good diet to manage her sugars.  -She will continue f/u w/ her PCP for this.   5) Hand-foot syndrome  -Secondary to Xeloda  -She uses Eucerin and Hydrocortisone, it's resolved now   6) Lower extremity swelling -I will do a LE doppler to rule out DVT today, it came back negative for DVT  -I encourage patient to elevate her legs, and to use compression stocking   PLAN:  -Doppler LE Today  -Start Xeloda tomorrow 1019m BID for 1 week on and 1 week off.  -Labs and f/u in 2 weeks      No orders of the defined types were placed in this encounter.   All questions were answered. The patient knows to call the clinic with any problems, questions or concerns.   This document serves as a record of services personally performed by YTruitt Merle MD. It was created on her behalf by AJoslyn Devon a trained medical scribe. The creation of this record is based on the scribe's personal observations and the provider's statements to them.    I have reviewed the above documentation for accuracy and completeness, and I agree with the above.   FTruitt Merle MD 02/09/2017 5:10 PM

## 2017-02-09 ENCOUNTER — Other Ambulatory Visit: Payer: Self-pay | Admitting: *Deleted

## 2017-02-09 ENCOUNTER — Telehealth: Payer: Self-pay | Admitting: *Deleted

## 2017-02-09 ENCOUNTER — Telehealth: Payer: Self-pay | Admitting: Hematology

## 2017-02-09 ENCOUNTER — Ambulatory Visit (HOSPITAL_BASED_OUTPATIENT_CLINIC_OR_DEPARTMENT_OTHER): Payer: Medicare Other | Admitting: Hematology

## 2017-02-09 ENCOUNTER — Other Ambulatory Visit (HOSPITAL_BASED_OUTPATIENT_CLINIC_OR_DEPARTMENT_OTHER): Payer: Medicare Other

## 2017-02-09 ENCOUNTER — Encounter: Payer: Self-pay | Admitting: Hematology

## 2017-02-09 ENCOUNTER — Ambulatory Visit (HOSPITAL_COMMUNITY)
Admission: RE | Admit: 2017-02-09 | Discharge: 2017-02-09 | Disposition: A | Discharge status: Home / Self Care | Payer: Medicare Other | Source: Ambulatory Visit | Attending: Surgery | Admitting: Surgery

## 2017-02-09 VITALS — BP 165/69 | HR 81 | Temp 98.3°F | Resp 17 | Ht <= 58 in | Wt 118.8 lb

## 2017-02-09 DIAGNOSIS — D649 Anemia, unspecified: Secondary | ICD-10-CM

## 2017-02-09 DIAGNOSIS — C182 Malignant neoplasm of ascending colon: Secondary | ICD-10-CM

## 2017-02-09 DIAGNOSIS — E119 Type 2 diabetes mellitus without complications: Secondary | ICD-10-CM

## 2017-02-09 DIAGNOSIS — R600 Localized edema: Secondary | ICD-10-CM

## 2017-02-09 DIAGNOSIS — I8393 Asymptomatic varicose veins of bilateral lower extremities: Secondary | ICD-10-CM | POA: Insufficient documentation

## 2017-02-09 DIAGNOSIS — D500 Iron deficiency anemia secondary to blood loss (chronic): Secondary | ICD-10-CM

## 2017-02-09 DIAGNOSIS — I10 Essential (primary) hypertension: Secondary | ICD-10-CM

## 2017-02-09 DIAGNOSIS — R2240 Localized swelling, mass and lump, unspecified lower limb: Secondary | ICD-10-CM | POA: Diagnosis not present

## 2017-02-09 LAB — COMPREHENSIVE METABOLIC PANEL
ALT: 13 U/L (ref 0–55)
AST: 19 U/L (ref 5–34)
Albumin: 2.9 g/dL — ABNORMAL LOW (ref 3.5–5.0)
Alkaline Phosphatase: 64 U/L (ref 40–150)
Anion Gap: 6 mEq/L (ref 3–11)
BUN: 10.4 mg/dL (ref 7.0–26.0)
CALCIUM: 8.8 mg/dL (ref 8.4–10.4)
CHLORIDE: 110 meq/L — AB (ref 98–109)
CO2: 26 meq/L (ref 22–29)
CREATININE: 0.7 mg/dL (ref 0.6–1.1)
EGFR: >60 mL/min/{1.73_m2} (ref >60)
Glucose: 87 mg/dl (ref 70–140)
Potassium: 3.9 mEq/L (ref 3.5–5.1)
Sodium: 143 mEq/L (ref 136–145)
TOTAL PROTEIN: 5.6 g/dL — AB (ref 6.4–8.3)
Total Bilirubin: 0.61 mg/dL (ref 0.20–1.20)

## 2017-02-09 LAB — CBC & DIFF AND RETIC
BASO%: 1 % (ref 0.0–2.0)
BASOS ABS: 0.1 10*3/uL (ref 0.0–0.1)
EOS ABS: 0.1 10*3/uL (ref 0.0–0.5)
EOS%: 1.5 % (ref 0.0–7.0)
HCT: 40.9 % (ref 34.8–46.6)
HGB: 13 g/dL (ref 11.6–15.9)
Immature Retic Fract: 2.6 % (ref 1.60–10.00)
LYMPH%: 24.7 % (ref 14.0–49.7)
MCH: 29.3 pg (ref 25.1–34.0)
MCHC: 31.8 g/dL (ref 31.5–36.0)
MCV: 92.1 fL (ref 79.5–101.0)
MONO#: 0.4 10*3/uL (ref 0.1–0.9)
MONO%: 6.4 % (ref 0.0–14.0)
NEUT#: 4 10*3/uL (ref 1.5–6.5)
NEUT%: 66.4 % (ref 38.4–76.8)
PLATELETS: 225 10*3/uL (ref 145–400)
RBC: 4.44 10*6/uL (ref 3.70–5.45)
RDW: 17.5 % — ABNORMAL HIGH (ref 11.2–14.5)
Retic %: 1.89 % (ref 0.70–2.10)
Retic Ct Abs: 83.92 10*3/uL (ref 33.70–90.70)
WBC: 6 10*3/uL (ref 3.9–10.3)
lymph#: 1.5 10*3/uL (ref 0.9–3.3)

## 2017-02-09 LAB — FERRITIN: FERRITIN: 47 ng/mL (ref 9–269)

## 2017-02-09 NOTE — Telephone Encounter (Signed)
Late entry:  Received message from [facility] that bilateral doppler negative.  Reported to Dr Burr Medico.

## 2017-02-09 NOTE — Telephone Encounter (Signed)
Scheduled appt per 1/23 los - Gave patient AVS and calender per los.  

## 2017-02-16 ENCOUNTER — Other Ambulatory Visit: Payer: Self-pay | Admitting: Family Medicine

## 2017-02-19 NOTE — Progress Notes (Signed)
[place]  [phone]) [phone] [phone]  Clinic Follow up Note   Patient Care Team: Dettinger, [name], MD as PCP - General (Family Medicine) Clent Jacks, MD as Consulting Physician (Ophthalmology) Zadie Rhine Clent Demark, MD as Consulting Physician (Ophthalmology) Donnie Mesa, MD as Consulting Physician (General Surgery) Truitt Merle, MD as Consulting Physician (Hematology)   Date of Service:  02/23/2017   CHIEF COMPLAINTS:  Follow up cancer of the right colon   Oncology History   Cancer Staging Cancer of right colon [facility]) Staging form: Colon and Rectum, AJCC 8th Edition - Pathologic stage from 10/14/2016: Stage IIC (pT4b, pN0, cM0) - Signed by Truitt Merle, MD on 11/07/2016       Cancer of right colon ([place])   09/19/2016 Imaging    CT A/P 09/19/16 IMPRESSION: Large 9.5 cm intraluminal mass in the cecum and ascending colon, consistent with colon carcinoma. Tumor extension into adjacent pericolonic fat seen along the lateral wall.  Mild right pericolonic and mesenteric lymphadenopathy, suspicious for metastatic disease. No other sites of metastatic disease identified.  Colonic diverticulosis, without radiographic evidence of diverticulitis. Tiny hiatal hernia, and small epigastric ventral hernia containing only fat.  Aortic atherosclerosis.      10/14/2016 Initial Diagnosis    Cancer of right colon ([place])      10/14/2016 Surgery    OPEN RIGHT HEMICOLECTOMY and REPAIR OF EPIGASTRIC VENTRAL HERNIA by Dr. Georgette Dover and Dr. Dalbert Batman       10/14/2016 Pathology Results    10/14/16 Diagnosis Colon, segmental resection for tumor, Right ADENOCARCINOMA WITH EXTENSIVE EXTRA CELLULAR MUCIN, GRADE 1, (11.0 CM) THE TUMOR INVADES THROUGH THE CECUM WALL INTO ADJACENT TERMINAL ILEUM (PT4B) NINETEEN BENIGN LYMPH NODES (0/19) SUPPURATIVE INFLAMMATION WITH FIBROSIS AND ADHESION TO ABDOMINAL WALL NO ADENOCARCINOMA IDENTIFIED      11/19/2016 Imaging    CT Chest  WO Contrast 11/19/16 IMPRESSION: 1. No evidence of metastatic disease. 2. Aortic atherosclerosis (ICD10-170.0). Coronary artery calcification.      11/25/2016 -  Chemotherapy    Adjuvant Xeloda two weeks on, one week off starting 11/25/16 for 3 months        12/21/2016 Genetic Testing    Patient had genetic testing due to a personal history of colon cancer and a family history of breast, uterine, and colon cancer. The Common Hereditary Cancers Panel was ordered.  The Hereditary Gene Panel offered by [facility] includes sequencing and/or deletion duplication testing of the following 47 genes: APC, ATM, AXIN2, BARD1, BMPR1A, BRCA1, BRCA2, BRIP1, CDH1, CDKN2A (p14ARF), CDKN2A (p16INK4a), CHEK2, CDK4, CTNNA1, DICER1, EPCAM (Deletion/duplication testing only), GREM1 (promoter region deletion/duplication testing only), KIT, MEN1, MLH1, MSH2, MSH3, MSH6, MUTYH, NBN, NF1, NHTL1, PALB2, PDGFRA, PMS2, POLD1, POLE, PTEN, RAD50, RAD51C, RAD51D, SDHB, SDHC, SDHD, SMAD4, SMARCA4. STK11, TP53, TSC1, TSC2, and VHL.  The following genes were evaluated for sequence changes only: SDHA and HOXB13 c.251G>A variant only.  Results: Negative- no pathogenic variants identified.  The date of this test report is 12/21/2016.         HISTORY OF PRESENTING ILLNESS:  Ashley Peters 83 y.o. female is here because of cancer of the right colon. She presents with her daughter and husband. Initially, pt presented to her PCP's office on 09/19/16 for a 52mocheck-up and with complaints of worsening right lower quadrant pain that had been ongoing for several weeks. This was accompanied by fatigue, chills, and weight loss of approximately 30lbs over the past year. However, she reports that she was enrolled in a nutrition  program for diabetics to help her lose weight (she is only borderline diabetic). Her daughter was able to notice her fatigue as well. She had not noticed any bloody stools, however, she did notice some melena but was on  iron supplements for her anemia. She denies any significant constipation with this. She had also noticed a small area of distension over her area of pain in the right lower abdomen which she had not noticed previously. CT A/P was performed the same day which was remarkable for a large 9.5cm intraluminal mass in the cecum and ascending colon, consistent with colon carcinoma. There was tumor extension into the adjacent pericolonic fat seen along the lateral abdominal wall as well. Additionally, mild right pericolonic and mesenteric lymphadenopathy was also seen which was suspicious for metastatic disease. An epigastric ventral hernia was also found to be present on this scan.   Following these findings, she was subsequently referred to Dr Georgette Dover who performed both an open right hemicolectomy and epigastric ventral hernia repair on 10/14/16. She tolerated this procedure well and was monitored in the hospital until her discharge on 10/17/16, during which she recovered well. She was referred to medical oncology following her surgery and she has been recovering well since d/c. Since her surgery she denies any pain returning to the area. Her bowel movements have been normal and regular since her surgery and her appetite has been well. Her fatigue has also been improving and back to normal.   She currently lives alone in [place], [place] and very independent and active.    CURRENT THERAPY:  Adjuvant Xeloda 1551m q12h,  two weeks on, one week off starting 11/25/16 for 3 months, held on 01/05/17 due to diarrhea and mild tachycardia. Restarted on 02/10/17 1 week on and 1 week off with 10060mBID.    INTERVAL HISTORY:   Ashley SHEERs here for a follow up. She presents to the clinic today with her son.   She notes she has been taking her Xeloda and have been able to tolerate with moderate diarrhea. This is her week off. Her on weeks she will have diarrhea about 2 times. Her stool was watery. She will take 1 imodium  when she has diarrhea. She notes she will need a refill for Xeloda soon. She has been paying $11 for her Xeloda. She notes her lower legs have mild swelling. She reports she has not been taking her potassium. She wanted to know if she should restart it.    MEDICAL HISTORY:  Past Medical History:  Diagnosis Date  . Anemia   . Cancer ([facility]  . Colon cancer ([facility]  . Diverticulosis   . Family history of breast cancer   . Family history of colon cancer   . Family history of uterine cancer   . GERD (gastroesophageal reflux disease)   . History of kidney stones   . Hyperlipidemia   . Hypertension   . Macular degeneration   . Osteopenia     SURGICAL HISTORY: Past Surgical History:  Procedure Laterality Date  . ABDOMINAL HYSTERECTOMY    . ablation tr Great saphenous vein  10/17/2009  . BREAST EXCISIONAL BIOPSY Bilateral 1971  . brest biopsy  1971  . CHOLECYSTECTOMY    . EPIGASTRIC HERNIA REPAIR N/A 10/14/2016   Procedure: REPAIR OF EPIGASTRIC VENTRAL HERNIA;  Surgeon: TsDonnie MesaMD;  [facility] Service: General;  Laterality: N/A;  . lt. distal ureteral stone extraction  09/14/1991  . PARTIAL COLECTOMY  Right 10/14/2016   Procedure: OPEN RIGHT HEMICOLECTOMY;  Surgeon: Donnie Mesa, MD;  Location: [facility];  Service: General;  Laterality: Right;    SOCIAL HISTORY: Social History   Socioeconomic History  . Marital status: Widowed    Spouse name: Not on file  . Number of children: Not on file  . Years of education: Not on file  . Highest education level: Not on file  Social Needs  . Financial resource strain: Not on file  . Food insecurity - worry: Not on file  . Food insecurity - inability: Not on file  . Transportation needs - medical: Not on file  . Transportation needs - non-medical: Not on file  Occupational History  . Not on file  Tobacco Use  . Smoking status: Never Smoker  . Smokeless tobacco: Never Used  Substance and Sexual Activity  . Alcohol use: No  .  Drug use: No  . Sexual activity: Yes  Other Topics Concern  . Not on file  Social History Narrative  . Not on file    FAMILY HISTORY: Family History  Problem Relation Age of Onset  . Stroke Father   . Breast cancer Sister        dx 82's, died at 49  . Diabetes Brother   . Kidney disease Brother   . Heart disease Brother   . Epilepsy Son   . Colon cancer Other 15  . Uterine cancer Other 53  . Hodgkin's lymphoma Other     ALLERGIES:  is allergic to benicar [olmesartan medoxomil] and amoxicillin.  MEDICATIONS:  Current Outpatient Medications  Medication Sig Dispense Refill  . acetaminophen (TYLENOL) 500 MG tablet Take 2 tablets (1,000 mg total) by mouth every 6 (six) hours as needed for moderate pain. 30 tablet 0  . capecitabine (XELODA) 500 MG tablet Take 3 tablets (1,500 mg total) by mouth 2 (two) times daily after a meal. Take for 14 days on, 7 days off, repeat every 21 days 84 tablet 2  . cholecalciferol (VITAMIN D) 1000 units tablet Take 1,000 Units by mouth daily.    . diphenoxylate-atropine (LOMOTIL) 2.5-0.025 MG tablet Take 1 tablet 4 (four) times daily as needed by mouth for diarrhea or loose stools. 60 tablet 0  . ferrous sulfate 325 (65 FE) MG tablet TAKE 1 TABLET (325 MG TOTAL) BY MOUTH 2 (TWO) TIMES DAILY WITH A MEAL. 60 tablet 0  . Multiple Vitamins-Minerals (PRESERVISION AREDS 2 PO) Take 1 capsule by mouth 2 (two) times daily.    . ondansetron (ZOFRAN) 8 MG tablet Take 1 tablet (8 mg total) by mouth every 8 (eight) hours as needed for nausea or vomiting. 30 tablet 2  . potassium chloride SA (K-DUR,KLOR-CON) 20 MEQ tablet Take 1 tablet (20 mEq total) by mouth 2 (two) times daily. (Patient not taking: Reported on 01/19/2017) 20 tablet 0   No current facility-administered medications for this visit.     REVIEW OF SYSTEMS:    Constitutional: Denies fevers, chills or abnormal night sweats  Eyes: Denies blurriness of vision, double vision or watery eyes Ears, nose,  mouth, throat, and face: Denies mucositis or sore throat Respiratory: Denies cough, dyspnea or wheezes Cardiovascular: Denies palpitation, chest discomfort (+) lower extremity swelling of ankles  Gastrointestinal:  Denies heartburn  (+) hemorrhoids (+) moderate diarrhea/watery stool Skin: Denies abnormal skin rashes  Lymphatics: Denies new lymphadenopathy or easy bruising Neurological:Denies numbness, tingling or new weaknesses Behavioral/Psych: Mood is stable, no new changes  All other systems were reviewed  with the patient and are negative.  PHYSICAL EXAMINATION:  ECOG PERFORMANCE STATUS: 0  Vitals:   02/23/17 0850  BP: (!) 168/76  Pulse: 72  Resp: 20  Temp: 98.2 F (36.8 C)  SpO2: 98%   Filed Weights   02/23/17 0850  Weight: 123 lb 11.2 oz (56.1 kg)     GENERAL:alert, no distress and comfortable SKIN: skin color, texture, turgor are normal, no rashes or significant lesions EYES: normal, conjunctiva are pink and non-injected, sclera clear OROPHARYNX:no exudate, no erythema and lips, buccal mucosa, and tongue normal  NECK: supple, thyroid normal size, non-tender, without nodularity LYMPH:  no palpable lymphadenopathy in the cervical, axillary or inguinal LUNGS: clear to auscultation and percussion with normal breathing effort HEART: regular rhythm and no murmurs (+) mild pitting edema on both lower extremity up to mid calf  ABDOMEN:abdomen soft, non-tender and normal bowel sounds. Midline surgical incision has healed well. Musculoskeletal:no cyanosis of digits and no clubbing  PSYCH: alert & oriented x 3 with fluent speech NEURO: no focal motor/sensory deficits  LABORATORY DATA:  I have reviewed the data as listed CBC Latest Ref Rng & Units 02/23/2017 02/09/2017 01/19/2017  WBC 3.9 - 10.3 10e3/uL 4.6 6.0 6.9  Hemoglobin 11.6 - 15.9 g/dL 13.0 13.0 15.2  Hematocrit 34.8 - 46.6 % 40.3 40.9 46.7(H)  Platelets 145 - 400 10e3/uL 190 225 253   CMP Latest Ref Rng & Units  02/23/2017 02/09/2017 01/19/2017  Glucose 70 - 140 mg/dl 92 87 90  BUN 7.0 - 26.0 mg/dL 14.2 10.4 17.2  Creatinine 0.6 - 1.1 mg/dL 0.6 0.7 0.7  Sodium 136 - 145 mEq/L 141 143 141  Potassium 3.5 - 5.1 mEq/L 4.2 3.9 4.6  Chloride 101 - 111 mmol/L - - -  CO2 22 - 29 mEq/L '23 26 28  ' Calcium 8.4 - 10.4 mg/dL 9.0 8.8 8.5  Total Protein 6.4 - 8.3 g/dL 6.0(L) 5.6(L) 4.9(L)  Total Bilirubin 0.20 - 1.20 mg/dL 0.82 0.61 0.50  Alkaline Phos 40 - 150 U/L 75 64 62  AST 5 - 34 U/L 19 19 41(H)  ALT 0 - 55 U/L '16 13 26    ' PATHOLOGY REPORTS:   10/14/16 Diagnosis Colon, segmental resection for tumor, Right ADENOCARCINOMA WITH EXTENSIVE EXTRA CELLULAR MUCIN, GRADE 1, (11.0 CM) THE TUMOR INVADES THROUGH THE CECUM WALL INTO ADJACENT TERMINAL ILEUM (PT4B) NINETEEN BENIGN LYMPH NODES (0/19) SUPPURATIVE INFLAMMATION WITH FIBROSIS AND ADHESION TO ABDOMINAL WALL NO ADENOCARCINOMA IDENTIFIED Microscopic Comment COLON AND RECTUM (INCLUDING TRANS-ANAL RESECTION): Specimen: Right colon with terminal ileum Procedure: Segmental resection Tumor site: Cecum and terminal ileum Specimen integrity: Intact Macroscopic intactness of mesorectum: Not applicable: X Complete: NA Near complete: NA Incomplete: NA Cannot be determined (specify): NA Macroscopic tumor perforation: Invades adjacent structure Invasive tumor: Maximum size: 11.0 cm Histologic type(s): Adenocarcinoma with extracellular mucin Histologic grade and differentiation: G1: well differentiated/low grade G2: moderately differentiated/low grade G3: poorly differentiated/high grade G4: undifferentiated/high grade Type of polyp in which invasive carcinoma arose: Tubular adenoma Microscopic extension of invasive tumor: Invades adjacent terminal ileum 1 of 5 Supplemental copy SUPPLEMENTAL for ELIZA, GREEN ([id number]) Microscopic Comment(continued) Lymph-Vascular invasion: Negative Peri-neural invasion: Negative Tumor deposit(s) (discontinuous  extramural extension): Negative Resection margins: Proximal margin: Negative Distal margin: Negative Circumferential (radial) (posterior ascending, posterior descending; lateral and posterior mid-rectum; and entire lower 1/3 rectum):NA Mesenteric margin (sigmoid and transverse): NA Distance closest margin (if all above margins negative): 1.5 cm from abdominal wall adhesion resection Trans-anal resection margins only: Deep margin: NA  Mucosal Margin: NA Distance closest mucosal margin (if negative): NA Treatment effect (neo-adjuvant therapy): Negative Additional polyp(s): Negative Non-neoplastic findings: Unremarkable Lymph nodes: number examined 19; number positive: 0 Pathologic Staging: pT4b, pN0, pMx Ancillary studies: Ordered  RADIOGRAPHIC STUDIES: I have personally reviewed the radiological images as listed and agreed with the findings in the report.  No results found.   CT A/P 09/19/16 IMPRESSION: Large 9.5 cm intraluminal mass in the cecum and ascending colon, consistent with colon carcinoma. Tumor extension into adjacent pericolonic fat seen along the lateral wall.  Mild right pericolonic and mesenteric lymphadenopathy, suspicious for metastatic disease. No other sites of metastatic disease identified.  Colonic diverticulosis, without radiographic evidence of diverticulitis. Tiny hiatal hernia, and small epigastric ventral hernia containing only fat.  Aortic atherosclerosis.  ASSESSMENT: 83 y.o. 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 She was found to have right-sided colon cancer beginning in the cecum. She did have an impressively large at 11cm tumor which invaded around the adjacent small intestine which was removed without complication by Dr Georgette Dover. Her surgical margins were negative. All 19 lymph nodes were  negative. -I reviewed her staging CT abd/pel scan and chest, which was negative for metastatic disease.  -She had stage II disease, early-stage but with high risk features, because of her T4b tumor  - I discussed her high recurrence risk due to T4b lesion, especially local recurrence  -Given her high risk of recurrence, I recommend her to consider adjuvant Xeloda. She is 65, but a very healthy and functional, she may be able tolerate Xeloda well. Due to her advanced age, I recommend her to take Xeloda for 3 months, instead of standard 6 months therapy.  -She has started adjuvant Xeloda on 11/25/16, tolerated her first cycle very well -She did not tolerated cycle 2 Xeloda well due to diarrhea and nausea. She was dehydrated and mildly tachycardic. She was given IV fluids and prescribed lomotil.  -Held Xeloda since 01/05/17 to give time to recover. -Given her age, I have a low threshold to stop chemotherapy treatment.  -I discussed the options of continuing chemo or stopping completely. I disused her current risk of recurrence is 30-40%. After a long discussion she prefers to continue treatment at low dose to lower her risk of recurrence. She started Xeloda at suggested low dose 1072m BID (1 week on and 1 weeks off) on 02/10/17  -She has been tolerating current dose Xeloda well overall with moderate diarrhea/watery stool. I advised her to take 1 imodium whenever she experiences this and to drink plenty of water.  -Labs reviewed, CBC is overall normal, her CMP is still pending. If her potassium is low I will contact her about restarting potassium pill.  -She will start next cycle Xeloda tomorrow  -F/u on 12/31   2.) Genetics -Due to her prevalent family history of cancer, I suggested that she should follow up with genetics for testing. She would like to have this performed.  -She does have two sons and one daughter, I also informed her that if her genetic testing were to come back with abnormal results  that her children should be tested as well.  -Also advised that her children should have routine colonoscopies.  -She completed genetic testing 12/03/16 and her results are negative  3.) Anemia  -We will monitor her Hgb and Iron levels. If these continue  to worsen or do not improve, we will consider infusion to aid with this.  -I encouraged her to continue taking her iron supplements at home.   -We will monitor this with her lab work.  -As of 12/03/16 her Hg is 13.5, and Ferritin is normal at 61.  -Hg resolved at 13.5 as of 12/15/16 -She will continue iron supplement for one more month and then she can stop.  -She is currently off oral iron   4.) Borderline DM and HTN: -She was on BP medication prior to her weight loss, she was taken off of this following her weight loss.   -Continue with weight loss plan for management and I encouraged her to maintain a good diet to manage her sugars.  -She will continue f/u w/ her PCP for this.   5) Hand-foot syndrome  -Secondary to Xeloda  -She uses Eucerin and Hydrocortisone, it's resolved now   6) Lower extremity swelling -I will do a LE doppler to rule out DVT, it came back negative for DVT  -I encourage patient to elevate her legs, and to use compression stocking -Her 02/09/17 Doppler was negative for blood clots   PLAN:  -she will start Xeloda tomorrow, for 7 days, then off 7 days -Lab and f/u on 12/31  -Contact pt with potassium lab results, if low will restart her oral potassium.     No orders of the defined types were placed in this encounter.   All questions were answered. The patient knows to call the clinic with any problems, questions or concerns.   This document serves as a record of services personally performed by Truitt Merle, MD. It was created on her behalf by Joslyn Devon, a trained medical scribe. The creation of this record is based on the scribe's personal observations and the provider's statements to them.    I have  reviewed the above documentation for accuracy and completeness, and I agree with the above.   Truitt Merle, MD 02/23/2017 10:19 AM

## 2017-02-23 ENCOUNTER — Telehealth: Payer: Self-pay | Admitting: Hematology

## 2017-02-23 ENCOUNTER — Encounter: Payer: Self-pay | Admitting: Hematology

## 2017-02-23 ENCOUNTER — Other Ambulatory Visit (HOSPITAL_BASED_OUTPATIENT_CLINIC_OR_DEPARTMENT_OTHER): Payer: Medicare Other

## 2017-02-23 ENCOUNTER — Ambulatory Visit (HOSPITAL_BASED_OUTPATIENT_CLINIC_OR_DEPARTMENT_OTHER): Payer: Medicare Other | Admitting: Hematology

## 2017-02-23 VITALS — BP 168/76 | HR 72 | Temp 98.2°F | Resp 20 | Ht <= 58 in | Wt 123.7 lb

## 2017-02-23 DIAGNOSIS — I10 Essential (primary) hypertension: Secondary | ICD-10-CM

## 2017-02-23 DIAGNOSIS — D649 Anemia, unspecified: Secondary | ICD-10-CM

## 2017-02-23 DIAGNOSIS — C182 Malignant neoplasm of ascending colon: Secondary | ICD-10-CM

## 2017-02-23 DIAGNOSIS — R2240 Localized swelling, mass and lump, unspecified lower limb: Secondary | ICD-10-CM

## 2017-02-23 DIAGNOSIS — C180 Malignant neoplasm of cecum: Secondary | ICD-10-CM | POA: Diagnosis not present

## 2017-02-23 LAB — COMPREHENSIVE METABOLIC PANEL
ALT: 16 U/L (ref 0–55)
ANION GAP: 8 meq/L (ref 3–11)
AST: 19 U/L (ref 5–34)
Albumin: 3.3 g/dL — ABNORMAL LOW (ref 3.5–5.0)
Alkaline Phosphatase: 75 U/L (ref 40–150)
BILIRUBIN TOTAL: 0.82 mg/dL (ref 0.20–1.20)
BUN: 14.2 mg/dL (ref 7.0–26.0)
CO2: 23 meq/L (ref 22–29)
CREATININE: 0.6 mg/dL (ref 0.6–1.1)
Calcium: 9 mg/dL (ref 8.4–10.4)
Chloride: 110 mEq/L — ABNORMAL HIGH (ref 98–109)
EGFR: >60 mL/min/{1.73_m2} (ref >60)
GLUCOSE: 92 mg/dL (ref 70–140)
Potassium: 4.2 mEq/L (ref 3.5–5.1)
Sodium: 141 mEq/L (ref 136–145)
TOTAL PROTEIN: 6 g/dL — AB (ref 6.4–8.3)

## 2017-02-23 LAB — CBC & DIFF AND RETIC
BASO%: 0.7 % (ref 0.0–2.0)
Basophils Absolute: 0 10*3/uL (ref 0.0–0.1)
EOS ABS: 0.1 10*3/uL (ref 0.0–0.5)
EOS%: 2.2 % (ref 0.0–7.0)
HEMATOCRIT: 40.3 % (ref 34.8–46.6)
HGB: 13 g/dL (ref 11.6–15.9)
IMMATURE RETIC FRACT: 1.6 % (ref 1.60–10.00)
LYMPH#: 1.1 10*3/uL (ref 0.9–3.3)
LYMPH%: 24.6 % (ref 14.0–49.7)
MCH: 30.4 pg (ref 25.1–34.0)
MCHC: 32.3 g/dL (ref 31.5–36.0)
MCV: 94.4 fL (ref 79.5–101.0)
MONO#: 0.4 10*3/uL (ref 0.1–0.9)
MONO%: 9.5 % (ref 0.0–14.0)
NEUT#: 2.9 10*3/uL (ref 1.5–6.5)
NEUT%: 63 % (ref 38.4–76.8)
PLATELETS: 190 10*3/uL (ref 145–400)
RBC: 4.27 10*6/uL (ref 3.70–5.45)
RDW: 17.7 % — ABNORMAL HIGH (ref 11.2–14.5)
RETIC CT ABS: 78.14 10*3/uL (ref 33.70–90.70)
Retic %: 1.83 % (ref 0.70–2.10)
WBC: 4.6 10*3/uL (ref 3.9–10.3)

## 2017-02-23 NOTE — Telephone Encounter (Signed)
Scheduled appt per 12/17 los - Gave patient AVS and calender per los.  

## 2017-02-25 DIAGNOSIS — H353212 Exudative age-related macular degeneration, right eye, with inactive choroidal neovascularization: Secondary | ICD-10-CM | POA: Diagnosis not present

## 2017-02-25 DIAGNOSIS — H35731 Hemorrhagic detachment of retinal pigment epithelium, right eye: Secondary | ICD-10-CM | POA: Diagnosis not present

## 2017-02-25 DIAGNOSIS — H353113 Nonexudative age-related macular degeneration, right eye, advanced atrophic without subfoveal involvement: Secondary | ICD-10-CM | POA: Diagnosis not present

## 2017-02-25 DIAGNOSIS — H353123 Nonexudative age-related macular degeneration, left eye, advanced atrophic without subfoveal involvement: Secondary | ICD-10-CM | POA: Diagnosis not present

## 2017-02-26 ENCOUNTER — Telehealth: Payer: Self-pay | Admitting: *Deleted

## 2017-02-26 NOTE — Telephone Encounter (Signed)
-----   Message from Truitt Merle, MD sent at 02/23/2017  7:21 PM EST ----- Please let pt know her K is normal from today. No need to take KCL pill. Thanks  Truitt Merle 02/23/2017

## 2017-02-26 NOTE — Telephone Encounter (Signed)
Called pt & informed of normal K+ & no need to take oral K+.  Pt expressed understanding.

## 2017-03-02 ENCOUNTER — Other Ambulatory Visit: Payer: Self-pay | Admitting: Hematology

## 2017-03-02 DIAGNOSIS — C182 Malignant neoplasm of ascending colon: Secondary | ICD-10-CM

## 2017-03-08 NOTE — Progress Notes (Signed)
[place]  [phone] [phone]  Clinic Follow up Note   Patient Care Team: Dettinger, Ashley Kaufmann, MD as PCP - General (Family Medicine) Clent Jacks, MD as Consulting Physician (Ophthalmology) Zadie Rhine Clent Demark, MD as Consulting Physician (Ophthalmology) Donnie Mesa, MD as Consulting Physician (General Surgery) Truitt Merle, MD as Consulting Physician (Hematology)   Date of Service:  03/09/2017   CHIEF COMPLAINTS:  Follow up cancer of the right colon   Oncology History   Cancer Staging Cancer of right colon [facility]) Staging form: Colon and Rectum, AJCC 8th Edition - Pathologic stage from 10/14/2016: Stage IIC (pT4b, pN0, cM0) - Signed by Truitt Merle, MD on 11/07/2016       Cancer of right colon ([place])   09/19/2016 Imaging    CT A/P 09/19/16 IMPRESSION: Large 9.5 cm intraluminal mass in the cecum and ascending colon, consistent with colon carcinoma. Tumor extension into adjacent pericolonic fat seen along the lateral wall.  Mild right pericolonic and mesenteric lymphadenopathy, suspicious for metastatic disease. No other sites of metastatic disease identified.  Colonic diverticulosis, without radiographic evidence of diverticulitis. Tiny hiatal hernia, and small epigastric ventral hernia containing only fat.  Aortic atherosclerosis.      10/14/2016 Initial Diagnosis    Cancer of right colon ([place])      10/14/2016 Surgery    OPEN RIGHT HEMICOLECTOMY and REPAIR OF EPIGASTRIC VENTRAL HERNIA by Dr. Georgette Dover and Dr. Dalbert Batman       10/14/2016 Pathology Results    10/14/16 Diagnosis Colon, segmental resection for tumor, Right ADENOCARCINOMA WITH EXTENSIVE EXTRA CELLULAR MUCIN, GRADE 1, (11.0 CM) THE TUMOR INVADES THROUGH THE CECUM WALL INTO ADJACENT TERMINAL ILEUM (PT4B) NINETEEN BENIGN LYMPH NODES (0/19) SUPPURATIVE INFLAMMATION WITH FIBROSIS AND ADHESION TO ABDOMINAL WALL NO ADENOCARCINOMA IDENTIFIED      11/19/2016 Imaging    CT Chest  WO Contrast 11/19/16 IMPRESSION: 1. No evidence of metastatic disease. 2. Aortic atherosclerosis (ICD10-170.0). Coronary artery calcification.      11/25/2016 -  Chemotherapy    Adjuvant Xeloda two weeks on, one week off starting 11/25/16 for 3 months        12/21/2016 Genetic Testing    Patient had genetic testing due to a personal history of colon cancer and a family history of breast, uterine, and colon cancer. The Common Hereditary Cancers Panel was ordered.  The Hereditary Gene Panel offered by [facility] includes sequencing and/or deletion duplication testing of the following 47 genes: APC, ATM, AXIN2, BARD1, BMPR1A, BRCA1, BRCA2, BRIP1, CDH1, CDKN2A (p14ARF), CDKN2A (p16INK4a), CHEK2, CDK4, CTNNA1, DICER1, EPCAM (Deletion/duplication testing only), GREM1 (promoter region deletion/duplication testing only), KIT, MEN1, MLH1, MSH2, MSH3, MSH6, MUTYH, NBN, NF1, NHTL1, PALB2, PDGFRA, PMS2, POLD1, POLE, PTEN, RAD50, RAD51C, RAD51D, SDHB, SDHC, SDHD, SMAD4, SMARCA4. STK11, TP53, TSC1, TSC2, and VHL.  The following genes were evaluated for sequence changes only: SDHA and HOXB13 c.251G>A variant only.  Results: Negative- no pathogenic variants identified.  The date of this test report is 12/21/2016.         HISTORY OF PRESENTING ILLNESS:  Ashley Peters 83 y.o. female is here because of cancer of the right colon. She presents with her daughter and husband. Initially, pt presented to her PCP's office on 09/19/16 for a 25mocheck-up and with complaints of worsening right lower quadrant pain that had been ongoing for several weeks. This was accompanied by fatigue, chills, and weight loss of approximately 30lbs over the past year. However, she reports that she was enrolled in a nutrition  program for diabetics to help her lose weight (she is only borderline diabetic). Her daughter was able to notice her fatigue as well. She had not noticed any bloody stools, however, she did notice some melena but was on  iron supplements for her anemia. She denies any significant constipation with this. She had also noticed a small area of distension over her area of pain in the right lower abdomen which she had not noticed previously. CT A/P was performed the same day which was remarkable for a large 9.5cm intraluminal mass in the cecum and ascending colon, consistent with colon carcinoma. There was tumor extension into the adjacent pericolonic fat seen along the lateral abdominal wall as well. Additionally, mild right pericolonic and mesenteric lymphadenopathy was also seen which was suspicious for metastatic disease. An epigastric ventral hernia was also found to be present on this scan.   Following these findings, she was subsequently referred to Dr Georgette Dover who performed both an open right hemicolectomy and epigastric ventral hernia repair on 10/14/16. She tolerated this procedure well and was monitored in the hospital until her discharge on 10/17/16, during which she recovered well. She was referred to medical oncology following her surgery and she has been recovering well since d/c. Since her surgery she denies any pain returning to the area. Her bowel movements have been normal and regular since her surgery and her appetite has been well. Her fatigue has also been improving and back to normal.   She currently lives alone in [place], [place] and very independent and active.    CURRENT THERAPY:  Adjuvant Xeloda 1542m q12h,  two weeks on, one week off starting 11/25/16 for 3 months, held on 01/05/17 due to diarrhea and mild tachycardia. Restarted on 02/10/17 1 week on and 1 week off with 10021mBID.    INTERVAL HISTORY:   JoTINIKA BUCKNAMs here for a follow up prior to the start of her last cycle. She presents to the clinic today with her daughter. She notes that she is doing well overall. She denies any issues with her xeloda at this time. She starts tomorrow on her new cycle of Xeloda. She notes that this is her off week.     On review of systems, pt reports diarrhea consisted of 2 episodes twice a day. She denies nausea, decreased PO intake, skin changes, numbness, tingling, or any other symptoms.     MEDICAL HISTORY:  Past Medical History:  Diagnosis Date  . Anemia   . Cancer ([facility]  . Colon cancer ([facility]  . Diverticulosis   . Family history of breast cancer   . Family history of colon cancer   . Family history of uterine cancer   . GERD (gastroesophageal reflux disease)   . History of kidney stones   . Hyperlipidemia   . Hypertension   . Macular degeneration   . Osteopenia     SURGICAL HISTORY: Past Surgical History:  Procedure Laterality Date  . ABDOMINAL HYSTERECTOMY    . ablation tr Great saphenous vein  10/17/2009  . BREAST EXCISIONAL BIOPSY Bilateral 1971  . brest biopsy  1971  . CHOLECYSTECTOMY    . EPIGASTRIC HERNIA REPAIR N/A 10/14/2016   Procedure: REPAIR OF EPIGASTRIC VENTRAL HERNIA;  Surgeon: TsDonnie MesaMD;  [facility] Service: General;  Laterality: N/A;  . lt. distal ureteral stone extraction  09/14/1991  . PARTIAL COLECTOMY Right 10/14/2016   Procedure: OPEN RIGHT HEMICOLECTOMY;  Surgeon: TsDonnie MesaMD;  [facility];  Service: General;  Laterality: Right;    SOCIAL HISTORY: Social History   Socioeconomic History  . Marital status: Widowed    Spouse name: Not on file  . Number of children: Not on file  . Years of education: Not on file  . Highest education level: Not on file  Social Needs  . Financial resource strain: Not on file  . Food insecurity - worry: Not on file  . Food insecurity - inability: Not on file  . Transportation needs - medical: Not on file  . Transportation needs - non-medical: Not on file  Occupational History  . Not on file  Tobacco Use  . Smoking status: Never Smoker  . Smokeless tobacco: Never Used  Substance and Sexual Activity  . Alcohol use: No  . Drug use: No  . Sexual activity: Yes  Other Topics Concern  . Not on  file  Social History Narrative  . Not on file    FAMILY HISTORY: Family History  Problem Relation Age of Onset  . Stroke Father   . Breast cancer Sister        dx 74's, died at 50  . Diabetes Brother   . Kidney disease Brother   . Heart disease Brother   . Epilepsy Son   . Colon cancer Other 49  . Uterine cancer Other 32  . Hodgkin's lymphoma Other     ALLERGIES:  is allergic to benicar [olmesartan medoxomil] and amoxicillin.  MEDICATIONS:  Current Outpatient Medications  Medication Sig Dispense Refill  . acetaminophen (TYLENOL) 500 MG tablet Take 2 tablets (1,000 mg total) by mouth every 6 (six) hours as needed for moderate pain. 30 tablet 0  . capecitabine (XELODA) 500 MG tablet Take 3 tablets (1,500 mg total) by mouth 2 (two) times daily after a meal. Take for 14 days on, 7 days off, repeat every 21 days 84 tablet 2  . cholecalciferol (VITAMIN D) 1000 units tablet Take 1,000 Units by mouth daily.    . diphenoxylate-atropine (LOMOTIL) 2.5-0.025 MG tablet Take 1 tablet 4 (four) times daily as needed by mouth for diarrhea or loose stools. 60 tablet 0  . ferrous sulfate 325 (65 FE) MG tablet TAKE 1 TABLET (325 MG TOTAL) BY MOUTH 2 (TWO) TIMES DAILY WITH A MEAL. 60 tablet 0  . Multiple Vitamins-Minerals (PRESERVISION AREDS 2 PO) Take 1 capsule by mouth 2 (two) times daily.    . ondansetron (ZOFRAN) 8 MG tablet Take 1 tablet (8 mg total) by mouth every 8 (eight) hours as needed for nausea or vomiting. 30 tablet 2  . potassium chloride SA (K-DUR,KLOR-CON) 20 MEQ tablet Take 1 tablet (20 mEq total) by mouth 2 (two) times daily. (Patient not taking: Reported on 01/19/2017) 20 tablet 0   No current facility-administered medications for this visit.     REVIEW OF SYSTEMS:   Constitutional: Denies fevers, chills or abnormal night sweats  Eyes: Denies blurriness of vision, double vision or watery eyes Ears, nose, mouth, throat, and face: Denies mucositis or sore throat Respiratory:  Denies cough, dyspnea or wheezes Cardiovascular: Denies palpitation, chest discomfort (+) lower extremity swelling of ankles  Gastrointestinal:  Denies heartburn  (+) hemorrhoids (+) moderate diarrhea/watery stool Skin: Denies abnormal skin rashes  Lymphatics: Denies new lymphadenopathy or easy bruising Neurological:Denies numbness, tingling or new weaknesses Behavioral/Psych: Mood is stable, no new changes  All other systems were reviewed with the patient and are negative.  PHYSICAL EXAMINATION:  ECOG PERFORMANCE STATUS: 0  Vitals:  03/09/17 1501  BP: (!) 182/75  Pulse: 79  Resp: 16  Temp: 98.1 F (36.7 C)  SpO2: 98%   Filed Weights   03/09/17 1501  Weight: 123 lb (55.8 kg)     GENERAL:alert, no distress and comfortable SKIN: skin color, texture, turgor are normal, no rashes or significant lesions EYES: normal, conjunctiva are pink and non-injected, sclera clear OROPHARYNX:no exudate, no erythema and lips, buccal mucosa, and tongue normal  NECK: supple, thyroid normal size, non-tender, without nodularity LYMPH:  no palpable lymphadenopathy in the cervical, axillary or inguinal LUNGS: clear to auscultation and percussion with normal breathing effort HEART: regular rhythm and no murmurs (+) mild pitting edema on both lower extremity up to mid calf  ABDOMEN:abdomen soft, non-tender and normal bowel sounds. Midline surgical incision has healed well. Musculoskeletal:no cyanosis of digits and no clubbing  PSYCH: alert & oriented x 3 with fluent speech NEURO: no focal motor/sensory deficits  LABORATORY DATA:  I have reviewed the data as listed CBC Latest Ref Rng & Units 03/09/2017 02/23/2017 02/09/2017  WBC 3.9 - 10.3 10e3/uL 4.7 4.6 6.0  Hemoglobin 11.6 - 15.9 g/dL 13.2 13.0 13.0  Hematocrit 34.8 - 46.6 % 40.7 40.3 40.9  Platelets 145 - 400 10e3/uL 200 190 225   CMP Latest Ref Rng & Units 03/09/2017 02/23/2017 02/09/2017  Glucose 70 - 140 mg/dl 84 92 87  BUN 7.0 - 26.0  mg/dL 10.4 14.2 10.4  Creatinine 0.6 - 1.1 mg/dL 0.7 0.6 0.7  Sodium 136 - 145 mEq/L 142 141 143  Potassium 3.5 - 5.1 mEq/L 4.0 4.2 3.9  Chloride 101 - 111 mmol/L - - -  CO2 22 - 29 mEq/L '28 23 26  ' Calcium 8.4 - 10.4 mg/dL 9.4 9.0 8.8  Total Protein 6.4 - 8.3 g/dL 6.5 6.0(L) 5.6(L)  Total Bilirubin 0.20 - 1.20 mg/dL 0.57 0.82 0.61  Alkaline Phos 40 - 150 U/L 88 75 64  AST 5 - 34 U/L '15 19 19  ' ALT 0 - 55 U/L '16 16 13    ' PATHOLOGY REPORTS:   10/14/16 Diagnosis Colon, segmental resection for tumor, Right ADENOCARCINOMA WITH EXTENSIVE EXTRA CELLULAR MUCIN, GRADE 1, (11.0 CM) THE TUMOR INVADES THROUGH THE CECUM WALL INTO ADJACENT TERMINAL ILEUM (PT4B) NINETEEN BENIGN LYMPH NODES (0/19) SUPPURATIVE INFLAMMATION WITH FIBROSIS AND ADHESION TO ABDOMINAL WALL NO ADENOCARCINOMA IDENTIFIED Microscopic Comment COLON AND RECTUM (INCLUDING TRANS-ANAL RESECTION): Specimen: Right colon with terminal ileum Procedure: Segmental resection Tumor site: Cecum and terminal ileum Specimen integrity: Intact Macroscopic intactness of mesorectum: Not applicable: X Complete: NA Near complete: NA Incomplete: NA Cannot be determined (specify): NA Macroscopic tumor perforation: Invades adjacent structure Invasive tumor: Maximum size: 11.0 cm Histologic type(s): Adenocarcinoma with extracellular mucin Histologic grade and differentiation: G1: well differentiated/low grade G2: moderately differentiated/low grade G3: poorly differentiated/high grade G4: undifferentiated/high grade Type of polyp in which invasive carcinoma arose: Tubular adenoma Microscopic extension of invasive tumor: Invades adjacent terminal ileum 1 of 5 Supplemental copy SUPPLEMENTAL for SABEEN, PIECHOCKI ([id number]) Microscopic Comment(continued) Lymph-Vascular invasion: Negative Peri-neural invasion: Negative Tumor deposit(s) (discontinuous extramural extension): Negative Resection margins: Proximal margin: Negative Distal  margin: Negative Circumferential (radial) (posterior ascending, posterior descending; lateral and posterior mid-rectum; and entire lower 1/3 rectum):NA Mesenteric margin (sigmoid and transverse): NA Distance closest margin (if all above margins negative): 1.5 cm from abdominal wall adhesion resection Trans-anal resection margins only: Deep margin: NA Mucosal Margin: NA Distance closest mucosal margin (if negative): NA Treatment effect (neo-adjuvant therapy): Negative Additional polyp(s): Negative Non-neoplastic findings:  Unremarkable Lymph nodes: number examined 19; number positive: 0 Pathologic Staging: pT4b, pN0, pMx Ancillary studies: Ordered  RADIOGRAPHIC STUDIES: I have personally reviewed the radiological images as listed and agreed with the findings in the report.  No results found.   CT A/P 09/19/16 IMPRESSION: Large 9.5 cm intraluminal mass in the cecum and ascending colon, consistent with colon carcinoma. Tumor extension into adjacent pericolonic fat seen along the lateral wall.  Mild right pericolonic and mesenteric lymphadenopathy, suspicious for metastatic disease. No other sites of metastatic disease identified.  Colonic diverticulosis, without radiographic evidence of diverticulitis. Tiny hiatal hernia, and small epigastric ventral hernia containing only fat.  Aortic atherosclerosis.  ASSESSMENT: 83 y.o. 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 She was found to have right-sided colon cancer beginning in the cecum. She did have an impressively large at 11cm tumor which invaded around the adjacent small intestine which was removed without complication by Dr Georgette Dover. Her surgical margins were negative. All 19 lymph nodes were negative. -I reviewed her staging CT abd/pel scan and chest, which was negative for metastatic  disease.  -She had stage II disease, early-stage but with high risk features, because of her T4b tumor  - I discussed her high recurrence risk due to T4b lesion, especially local recurrence  -Given her high risk of recurrence, I recommend her to consider adjuvant Xeloda. She is 33, but a very healthy and functional, she may be able tolerate Xeloda well. Due to her advanced age, I recommend her to take Xeloda for 3 months, instead of standard 6 months therapy.  -She has started adjuvant Xeloda on 11/25/16, tolerated her first cycle very well -She did not tolerated cycle 2 Xeloda well due to diarrhea and nausea. She was dehydrated and mildly tachycardic. She was given IV fluids and prescribed lomotil.  -Held Xeloda since 01/05/17 to give time to recover. -She re-started Xeloda at suggested low dose 1045m BID (1 week on and 1 weeks off) on 02/10/17, tolerating well overall  -She knows to take Imodium as needed for diarrhea - labs reviewed, CBC and CMP normal  -She will start her last cycle of Xeloda tomorrow for 7 days --I discussed the risk of cancer recurrence in the future. I discussed the surveillance plan, which is a physical exam and lab test (including CBC, CMP and CEA) every 3 months for the first 2 years, then every 6-12 months, colonoscopy in one year, and surveilliance CT scan every 6-12 month for up to 5 year.  -Lab and f/u in 4 weeks, will order CT scan on next visit    2.) Genetics -Due to her prevalent family history of cancer, I previously suggested that she should follow up with genetics for testing. She would like to have this performed.  -She does have two sons and one daughter, I also informed her that if her genetic testing were to come back with abnormal results that her children should be tested as well.  -Also advised that her children should have routine colonoscopies.  -She completed genetic testing 12/03/16 and her results are negative  3.) Anemia  -We will monitor her Hgb  and Iron levels. If these continue to worsen or do not improve, we will consider infusion to aid with this.  -I previously encouraged her to continue taking her iron supplements at home.   -We will monitor this  with her lab work.  -As of 12/03/16 her Hg is 13.5, and Ferritin is normal at 61.  -Hg resolved at 13.5 as of 12/15/16 -She will continue iron supplement for one more month and then she can stop.  -She is currently off oral iron   4.) Borderline DM and HTN: -She was on BP medication prior to her weight loss, she was taken off of this following her weight loss.   -Continue with weight loss plan for management and I encouraged her to maintain a good diet to manage her sugars.  -Advised the patient to monitor her blood pressure at home. Patient blood pressure today is 182/75 -She will continue f/u w/ her PCP for this.   5) Hand-foot syndrome  -Secondary to Xeloda  -She uses Eucerin and Hydrocortisone, it's resolved now   6) Lower extremity swelling -I will do a LE doppler to rule out DVT, it came back negative for DVT  -I previously encouraged patient to elevate her legs, and to use compression stocking -Her 02/09/17 Doppler was negative for blood clots   PLAN:  -She will start last cycle Xeloda tomorrow, 1000 mg twice daily for 7 days  -lab and f/u in 4 weeks, order surveillance CT scan on next visit.      No orders of the defined types were placed in this encounter.   All questions were answered. The patient knows to call the clinic with any problems, questions or concerns.      Truitt Merle, MD 03/09/2017

## 2017-03-09 ENCOUNTER — Telehealth: Payer: Self-pay | Admitting: Hematology

## 2017-03-09 ENCOUNTER — Other Ambulatory Visit (HOSPITAL_BASED_OUTPATIENT_CLINIC_OR_DEPARTMENT_OTHER): Payer: Medicare Other

## 2017-03-09 ENCOUNTER — Encounter: Payer: Self-pay | Admitting: Hematology

## 2017-03-09 ENCOUNTER — Ambulatory Visit (HOSPITAL_BASED_OUTPATIENT_CLINIC_OR_DEPARTMENT_OTHER): Payer: Medicare Other | Admitting: Hematology

## 2017-03-09 VITALS — BP 182/75 | HR 79 | Temp 98.1°F | Resp 16 | Ht <= 58 in | Wt 123.0 lb

## 2017-03-09 DIAGNOSIS — R2240 Localized swelling, mass and lump, unspecified lower limb: Secondary | ICD-10-CM | POA: Diagnosis not present

## 2017-03-09 DIAGNOSIS — C182 Malignant neoplasm of ascending colon: Secondary | ICD-10-CM

## 2017-03-09 DIAGNOSIS — D649 Anemia, unspecified: Secondary | ICD-10-CM

## 2017-03-09 DIAGNOSIS — I10 Essential (primary) hypertension: Secondary | ICD-10-CM | POA: Diagnosis not present

## 2017-03-09 DIAGNOSIS — D500 Iron deficiency anemia secondary to blood loss (chronic): Secondary | ICD-10-CM

## 2017-03-09 DIAGNOSIS — E119 Type 2 diabetes mellitus without complications: Secondary | ICD-10-CM | POA: Diagnosis not present

## 2017-03-09 LAB — CBC & DIFF AND RETIC
BASO%: 1.1 % (ref 0.0–2.0)
BASOS ABS: 0.1 10*3/uL (ref 0.0–0.1)
EOS%: 1.3 % (ref 0.0–7.0)
Eosinophils Absolute: 0.1 10*3/uL (ref 0.0–0.5)
HEMATOCRIT: 40.7 % (ref 34.8–46.6)
HGB: 13.2 g/dL (ref 11.6–15.9)
Immature Retic Fract: 2.1 % (ref 1.60–10.00)
LYMPH#: 1.6 10*3/uL (ref 0.9–3.3)
LYMPH%: 34.7 % (ref 14.0–49.7)
MCH: 31.3 pg (ref 25.1–34.0)
MCHC: 32.4 g/dL (ref 31.5–36.0)
MCV: 96.4 fL (ref 79.5–101.0)
MONO#: 0.6 10*3/uL (ref 0.1–0.9)
MONO%: 12.2 % (ref 0.0–14.0)
NEUT#: 2.4 10*3/uL (ref 1.5–6.5)
NEUT%: 50.7 % (ref 38.4–76.8)
PLATELETS: 200 10*3/uL (ref 145–400)
RBC: 4.22 10*6/uL (ref 3.70–5.45)
RDW: 16.7 % — ABNORMAL HIGH (ref 11.2–14.5)
RETIC CT ABS: 70.05 10*3/uL (ref 33.70–90.70)
Retic %: 1.66 % (ref 0.70–2.10)
WBC: 4.7 10*3/uL (ref 3.9–10.3)

## 2017-03-09 LAB — COMPREHENSIVE METABOLIC PANEL
ALBUMIN: 3.5 g/dL (ref 3.5–5.0)
ALK PHOS: 88 U/L (ref 40–150)
ALT: 16 U/L (ref 0–55)
ANION GAP: 6 meq/L (ref 3–11)
AST: 15 U/L (ref 5–34)
BILIRUBIN TOTAL: 0.57 mg/dL (ref 0.20–1.20)
BUN: 10.4 mg/dL (ref 7.0–26.0)
CALCIUM: 9.4 mg/dL (ref 8.4–10.4)
CHLORIDE: 108 meq/L (ref 98–109)
CO2: 28 mEq/L (ref 22–29)
CREATININE: 0.7 mg/dL (ref 0.6–1.1)
EGFR: >60 mL/min/{1.73_m2} (ref >60)
Glucose: 84 mg/dl (ref 70–140)
Potassium: 4 mEq/L (ref 3.5–5.1)
Sodium: 142 mEq/L (ref 136–145)
Total Protein: 6.5 g/dL (ref 6.4–8.3)

## 2017-03-09 NOTE — Telephone Encounter (Signed)
Gave patient avs report and appointments for January  °

## 2017-03-11 LAB — FERRITIN: FERRITIN: 60 ng/mL (ref 9–269)

## 2017-03-21 ENCOUNTER — Other Ambulatory Visit: Payer: Self-pay | Admitting: Family Medicine

## 2017-03-23 ENCOUNTER — Ambulatory Visit (INDEPENDENT_AMBULATORY_CARE_PROVIDER_SITE_OTHER): Payer: Medicare Other | Admitting: Family Medicine

## 2017-03-23 ENCOUNTER — Encounter: Payer: Self-pay | Admitting: Family Medicine

## 2017-03-23 VITALS — BP 145/73 | HR 69 | Temp 97.1°F | Ht <= 58 in | Wt 121.0 lb

## 2017-03-23 DIAGNOSIS — K219 Gastro-esophageal reflux disease without esophagitis: Secondary | ICD-10-CM | POA: Diagnosis not present

## 2017-03-23 DIAGNOSIS — D500 Iron deficiency anemia secondary to blood loss (chronic): Secondary | ICD-10-CM

## 2017-03-23 DIAGNOSIS — E782 Mixed hyperlipidemia: Secondary | ICD-10-CM

## 2017-03-23 DIAGNOSIS — I10 Essential (primary) hypertension: Secondary | ICD-10-CM

## 2017-03-23 DIAGNOSIS — E538 Deficiency of other specified B group vitamins: Secondary | ICD-10-CM | POA: Diagnosis not present

## 2017-03-23 NOTE — Progress Notes (Signed)
BP (!) 156/74   Pulse 69   Temp (!) 97.1 F (36.2 C) (Oral)   Ht '4\' 9"'  (1.448 m)   Wt 121 lb (54.9 kg)   BMI 26.18 kg/m    Subjective:    Patient ID: Ashley Peters, female    DOB: [birth year], 84 y.o.   MRN: [id number]  HPI: Ashley Peters is a 84 y.o. female presenting on 03/23/2017 for [facility] (oncologist has been concerned because her potassium and iron have both been low, patient has not been taking Klor-Con because it caused nausea and pill too large to swallow) and Left thigh pain (had same symptom when iron was low before)   HPI Hyperlipidemia Patient is coming in for recheck of his hyperlipidemia. The patient is currently taking no medication and is monitoring for diet control, we will check labs today. They deny any issues with myalgias or history of liver damage from it. They deny any focal numbness or weakness or chest pain.   Hypertension Patient is currently on no medication and we are monitoring, his blood pressure is up today but we will recheck and monitor, and their blood pressure today is 156/74. Patient denies any lightheadedness or dizziness. Patient denies headaches, blurred vision, chest pains, shortness of breath, or weakness. Denies any side effects from medication and is content with current medication.   GERD Patient is currently on no medication currently and denies any major issues.  She denies any major symptoms or abdominal pain or belching or burping. She denies any blood in her stool or lightheadedness or dizziness.   Chronic anemia Patient is coming in with chronic anemia for recheck.  She is starting to feel some cramping occasionally in her left thigh which was similar to when she had electrolyte and anemia problems before and her oncologist sent her over to get it checked.  She is currently finished her chemo treatment and is in the follow-up stage for right colon cancer.  Relevant past medical, surgical, family and social history reviewed and  updated as indicated. Interim medical history since our last visit reviewed. Allergies and medications reviewed and updated.  Review of Systems  Constitutional: Negative for chills and fever.  Eyes: Negative for visual disturbance.  Respiratory: Negative for chest tightness and shortness of breath.   Cardiovascular: Negative for chest pain and leg swelling.  Gastrointestinal: Negative for abdominal distention and abdominal pain.  Musculoskeletal: Positive for myalgias. Negative for back pain and gait problem.  Skin: Negative for rash.  Neurological: Negative for dizziness, light-headedness and headaches.  Psychiatric/Behavioral: Negative for agitation and behavioral problems.  All other systems reviewed and are negative.   Per HPI unless specifically indicated above     Objective:    BP (!) 156/74   Pulse 69   Temp (!) 97.1 F (36.2 C) (Oral)   Ht '4\' 9"'  (1.448 m)   Wt 121 lb (54.9 kg)   BMI 26.18 kg/m   Wt Readings from Last 3 Encounters:  03/23/17 121 lb (54.9 kg)  03/09/17 123 lb (55.8 kg)  02/23/17 123 lb 11.2 oz (56.1 kg)    Physical Exam  Constitutional: She is oriented to person, place, and time. She appears well-developed and well-nourished. No distress.  Eyes: Conjunctivae are normal.  Neck: Neck supple. No thyromegaly present.  Cardiovascular: Normal rate, regular rhythm, normal heart sounds and intact distal pulses.  No murmur heard. Pulmonary/Chest: Effort normal and breath sounds normal. No respiratory distress. She has no wheezes. She has no  rales.  Musculoskeletal: Normal range of motion. She exhibits no edema or tenderness (No tenderness on either thigh on exam.).  Lymphadenopathy:    She has no cervical adenopathy.  Neurological: She is alert and oriented to person, place, and time. Coordination normal.  Skin: Skin is warm and dry. No rash noted. She is not diaphoretic.  Psychiatric: She has a normal mood and affect. Her behavior is normal.  Nursing note  and vitals reviewed.       Assessment & Plan:   Problem List Items Addressed This Visit      Cardiovascular and Mediastinum   HTN (hypertension)   Relevant Orders   CMP14+EGFR     Digestive   GERD (gastroesophageal reflux disease)     Other   Hyperlipidemia   Relevant Orders   Lipid panel   Iron deficiency anemia due to chronic blood loss - Primary   Relevant Orders   Anemia Profile B       Follow up plan: Return if symptoms worsen or fail to improve.  Counseling provided for all of the vaccine components Orders Placed This Encounter  Procedures  . CMP14+EGFR  . Lipid panel  . Anemia Profile B    Caryl Pina, MD [facility] 03/23/2017, 10:12 AM

## 2017-03-24 ENCOUNTER — Ambulatory Visit (INDEPENDENT_AMBULATORY_CARE_PROVIDER_SITE_OTHER): Payer: Medicare Other

## 2017-03-24 DIAGNOSIS — E538 Deficiency of other specified B group vitamins: Secondary | ICD-10-CM | POA: Diagnosis not present

## 2017-03-24 LAB — ANEMIA PROFILE B
BASOS: 1 %
Basophils Absolute: 0 10*3/uL (ref 0.0–0.2)
EOS (ABSOLUTE): 0.2 10*3/uL (ref 0.0–0.4)
Eos: 3 %
FERRITIN: 117 ng/mL (ref 15–150)
HEMATOCRIT: 39.9 % (ref 34.0–46.6)
Hemoglobin: 13.3 g/dL (ref 11.1–15.9)
Immature Grans (Abs): 0 10*3/uL (ref 0.0–0.1)
Immature Granulocytes: 0 %
Iron Saturation: 18 % (ref 15–55)
Iron: 55 ug/dL (ref 27–139)
LYMPHS ABS: 1.4 10*3/uL (ref 0.7–3.1)
Lymphs: 22 %
MCH: 31.3 pg (ref 26.6–33.0)
MCHC: 33.3 g/dL (ref 31.5–35.7)
MCV: 94 fL (ref 79–97)
MONOCYTES: 7 %
MONOS ABS: 0.4 10*3/uL (ref 0.1–0.9)
NEUTROS ABS: 4.3 10*3/uL (ref 1.4–7.0)
Neutrophils: 67 %
Platelets: 262 10*3/uL (ref 150–379)
RBC: 4.25 x10E6/uL (ref 3.77–5.28)
RDW: 16.8 % — AB (ref 12.3–15.4)
RETIC CT PCT: 1.3 % (ref 0.6–2.6)
Total Iron Binding Capacity: 312 ug/dL (ref 250–450)
UIBC: 257 ug/dL (ref 118–369)
Vitamin B-12: <150 pg/mL — ABNORMAL LOW (ref 232–1245)
WBC: 6.3 10*3/uL (ref 3.4–10.8)

## 2017-03-24 LAB — CMP14+EGFR
A/G RATIO: 1.9 (ref 1.2–2.2)
ALT: 15 IU/L (ref 0–32)
AST: 19 IU/L (ref 0–40)
Albumin: 4.3 g/dL (ref 3.5–4.7)
Alkaline Phosphatase: 71 IU/L (ref 39–117)
BILIRUBIN TOTAL: 0.6 mg/dL (ref 0.0–1.2)
BUN/Creatinine Ratio: 33 — ABNORMAL HIGH (ref 12–28)
BUN: 20 mg/dL (ref 8–27)
CHLORIDE: 108 mmol/L — AB (ref 96–106)
CO2: 23 mmol/L (ref 20–29)
Calcium: 9.5 mg/dL (ref 8.7–10.3)
Creatinine, Ser: 0.61 mg/dL (ref 0.57–1.00)
GFR calc Af Amer: 95 mL/min/{1.73_m2} (ref >59)
GFR calc non Af Amer: 82 mL/min/{1.73_m2} (ref >59)
GLUCOSE: 96 mg/dL (ref 65–99)
Globulin, Total: 2.3 g/dL (ref 1.5–4.5)
POTASSIUM: 5.2 mmol/L (ref 3.5–5.2)
Sodium: 144 mmol/L (ref 134–144)
Total Protein: 6.6 g/dL (ref 6.0–8.5)

## 2017-03-24 LAB — LIPID PANEL
Chol/HDL Ratio: 1.8 ratio (ref 0.0–4.4)
Cholesterol, Total: 145 mg/dL (ref 100–199)
HDL: 82 mg/dL (ref >39)
LDL Calculated: 46 mg/dL (ref 0–99)
TRIGLYCERIDES: 84 mg/dL (ref 0–149)
VLDL CHOLESTEROL CAL: 17 mg/dL (ref 5–40)

## 2017-03-24 MED ORDER — CYANOCOBALAMIN 1000 MCG/ML IJ SOLN
1000.0000 ug | INTRAMUSCULAR | Status: AC
  Administered 2017-03-24 – 2021-12-19 (×53): 1000 ug via INTRAMUSCULAR

## 2017-03-24 NOTE — Addendum Note (Signed)
Addended by: Michaela Corner on: 03/24/2017 10:45 AM   Modules accepted: Orders

## 2017-03-24 NOTE — Progress Notes (Signed)
Patient was given B12 injection to left deltoid.  Patient tolerated well.

## 2017-03-24 NOTE — Patient Instructions (Signed)
Cyanocobalamin, Vitamin B12 injection What is this medicine? CYANOCOBALAMIN (sye an oh koe BAL a min) is a man made form of vitamin B12. Vitamin B12 is used in the growth of healthy blood cells, nerve cells, and proteins in the body. It also helps with the metabolism of fats and carbohydrates. This medicine is used to treat people who can not absorb vitamin B12. This medicine may be used for other purposes; ask your health care provider or pharmacist if you have questions. COMMON BRAND NAME(S): B-12 Compliance Kit, B-12 Injection Kit, Cyomin, LA-12, Nutri-Twelve, Physicians EZ Use B-12, Primabalt What should I tell my health care provider before I take this medicine? They need to know if you have any of these conditions: -kidney disease -Leber's disease -megaloblastic anemia -an unusual or allergic reaction to cyanocobalamin, cobalt, other medicines, foods, dyes, or preservatives -pregnant or trying to get pregnant -breast-feeding How should I use this medicine? This medicine is injected into a muscle or deeply under the skin. It is usually given by a health care professional in a clinic or doctor's office. However, your doctor may teach you how to inject yourself. Follow all instructions. Talk to your pediatrician regarding the use of this medicine in children. Special care may be needed. Overdosage: If you think you have taken too much of this medicine contact a poison control center or emergency room at once. NOTE: This medicine is only for you. Do not share this medicine with others. What if I miss a dose? If you are given your dose at a clinic or doctor's office, call to reschedule your appointment. If you give your own injections and you miss a dose, take it as soon as you can. If it is almost time for your next dose, take only that dose. Do not take double or extra doses. What may interact with this medicine? -colchicine -heavy alcohol intake This list may not describe all possible  interactions. Give your health care provider a list of all the medicines, herbs, non-prescription drugs, or dietary supplements you use. Also tell them if you smoke, drink alcohol, or use illegal drugs. Some items may interact with your medicine. What should I watch for while using this medicine? Visit your doctor or health care professional regularly. You may need blood work done while you are taking this medicine. You may need to follow a special diet. Talk to your doctor. Limit your alcohol intake and avoid smoking to get the best benefit. What side effects may I notice from receiving this medicine? Side effects that you should report to your doctor or health care professional as soon as possible: -allergic reactions like skin rash, itching or hives, swelling of the face, lips, or tongue -blue tint to skin -chest tightness, pain -difficulty breathing, wheezing -dizziness -red, swollen painful area on the leg Side effects that usually do not require medical attention (report to your doctor or health care professional if they continue or are bothersome): -diarrhea -headache This list may not describe all possible side effects. Call your doctor for medical advice about side effects. You may report side effects to FDA at 1-800-FDA-1088. Where should I keep my medicine? Keep out of the reach of children. Store at room temperature between 15 and 30 degrees C (59 and 85 degrees F). Protect from light. Throw away any unused medicine after the expiration date. NOTE: This sheet is a summary. It may not cover all possible information. If you have questions about this medicine, talk to your doctor, pharmacist, or   health care provider.  2018 Elsevier/Gold Standard (2007-06-07 22:10:20)  

## 2017-03-27 ENCOUNTER — Telehealth: Payer: Self-pay | Admitting: Family Medicine

## 2017-03-27 MED ORDER — MELOXICAM 15 MG PO TABS: 15.0000 mg | ORAL_TABLET | Freq: Every day | ORAL | 0 refills | Status: DC

## 2017-03-27 NOTE — Telephone Encounter (Signed)
Please advise on a stronger medication for patient's pain.

## 2017-03-27 NOTE — Telephone Encounter (Signed)
Aware of new  Script.

## 2017-03-28 ENCOUNTER — Encounter: Payer: Self-pay | Admitting: Family Medicine

## 2017-03-28 ENCOUNTER — Ambulatory Visit (INDEPENDENT_AMBULATORY_CARE_PROVIDER_SITE_OTHER): Payer: Medicare Other | Admitting: Family Medicine

## 2017-03-28 VITALS — BP 152/77 | HR 69 | Temp 96.7°F | Ht <= 58 in | Wt 121.0 lb

## 2017-03-28 DIAGNOSIS — M79652 Pain in left thigh: Secondary | ICD-10-CM

## 2017-03-28 MED ORDER — METHYLPREDNISOLONE ACETATE 80 MG/ML IJ SUSP
80.0000 mg | Freq: Once | INTRAMUSCULAR | Status: AC
  Administered 2017-03-28: 80 mg via INTRAMUSCULAR

## 2017-03-28 NOTE — Progress Notes (Signed)
Subjective:  Patient ID: Ashley Peters, female    DOB: [birth year]  Age: 84 y.o. MRN: [id number]  CC: left thigh pain   HPI [name] presents for pain limited to the anterior left thigh.  Present for several days and getting worse.  Possibly as long as 2 weeks.  It is making it difficult for her to walk especially to go upstairs.  It extends from the left groin crease anteriorly down the front of the left thigh to the patella.  It does not involve the knee.  It does not go beyond the knee.  She denies any back pain.  She was recently seen for this and did not get the meloxicam that was prescribed couple of days ago.  Therefore she is in today for further treatment.  Depression screen [facility] 2/9 03/28/2017 03/23/2017 09/19/2016  Decreased Interest 0 0 0  Down, Depressed, Hopeless 0 0 0  PHQ - 2 Score 0 0 0    History [name] has a past medical history of Anemia, Cancer ([place]), Colon cancer ([place]), Diverticulosis, Family history of breast cancer, Family history of colon cancer, Family history of uterine cancer, GERD (gastroesophageal reflux disease), History of kidney stones, Hyperlipidemia, Hypertension, Macular degeneration, and Osteopenia.   She has a past surgical history that includes Cholecystectomy; Abdominal hysterectomy; brest biopsy (1971); ablation tr Great saphenous vein (10/17/2009); lt. distal ureteral stone extraction (09/14/1991); Partial colectomy (Right, 10/14/2016); epigastric hernia repair (N/A, 10/14/2016); and Breast excisional biopsy (Bilateral, 1971).   Her family history includes Breast cancer in her sister; Colon cancer (age of onset: 71) in her other; Diabetes in her brother; Epilepsy in her son; Heart disease in her brother; Hodgkin's lymphoma in her other; Kidney disease in her brother; Stroke in her father; Uterine cancer (age of onset: 29) in her other.She reports that  has never smoked. she has never used smokeless tobacco. She reports that she does not drink alcohol or  use drugs.    ROS Review of Systems  Constitutional: Negative for activity change, appetite change and fever.  HENT: Negative for congestion, rhinorrhea and sore throat.   Eyes: Negative for visual disturbance.  Respiratory: Negative for cough and shortness of breath.   Cardiovascular: Negative for chest pain and palpitations.  Gastrointestinal: Negative for abdominal pain, diarrhea and nausea.  Genitourinary: Negative for dysuria.  Musculoskeletal: Positive for arthralgias. Negative for myalgias.    Objective:  BP (!) 152/77 (BP Location: Left Arm)   Pulse 69   Temp (!) 96.7 F (35.9 C) (Oral)   Ht 4\' 9"  (1.448 m)   Wt 121 lb (54.9 kg)   BMI 26.18 kg/m   BP Readings from Last 3 Encounters:  03/28/17 (!) 152/77  03/23/17 (!) 145/73  03/09/17 (!) 182/75    Wt Readings from Last 3 Encounters:  03/28/17 121 lb (54.9 kg)  03/23/17 121 lb (54.9 kg)  03/09/17 123 lb (55.8 kg)     Physical Exam  Constitutional: She is oriented to person, place, and time. She appears well-developed and well-nourished. No distress.  HENT:  Head: Normocephalic and atraumatic.  Eyes: Conjunctivae and EOM are normal. Pupils are equal, round, and reactive to light.  Neck: Normal range of motion. Neck supple.  Cardiovascular: Normal rate, regular rhythm and normal heart sounds.  No murmur heard. Pulmonary/Chest: Effort normal and breath sounds normal. No respiratory distress. She has no wheezes. She has no rales.  Abdominal: Soft. Bowel sounds are normal. She exhibits no distension. There is no  tenderness.  Musculoskeletal: She exhibits tenderness. She exhibits no edema.  The left thigh is tender for palpation over the quadriceps and sartorius.  There is no tenderness near the groin or the hip.  There is palpable spasm and swelling of the proximal portion of the quadriceps.  Muscle tone in general is fair to poor demonstrating age-related atrophy.  The tone is symmetrical for the right thigh area.   Neurological: She is alert and oriented to person, place, and time. She has normal reflexes.  Skin: Skin is warm and dry.  Psychiatric: She has a normal mood and affect. Her behavior is normal. Thought content normal.      Assessment & Plan:   [name] was seen today for left thigh pain.  Diagnoses and all orders for this visit:  Left thigh pain -     methylPREDNISolone acetate (DEPO-MEDROL) injection 80 mg  Musculoskeletal thigh pain, left       I have discontinued Valencia C. Fout's capecitabine, ondansetron, and potassium chloride SA. I am also having her maintain her Multiple Vitamins-Minerals (PRESERVISION AREDS 2 PO), cholecalciferol, acetaminophen, diphenoxylate-atropine, ferrous sulfate, and meloxicam. We administered methylPREDNISolone acetate. We will continue to administer cyanocobalamin.  Allergies as of 03/28/2017      Reactions   Benicar [olmesartan Medoxomil]    Elevated Potassium   Amoxicillin Rash   Has patient had a PCN reaction causing immediate rash, facial/tongue/throat swelling, SOB or lightheadedness with hypotension: Yes Has patient had a PCN reaction causing severe rash involving mucus membranes or skin necrosis: No Has patient had a PCN reaction that required hospitalization: No Has patient had a PCN reaction occurring within the last 10 years: No If all of the above answers are "NO", then may proceed with Cephalosporin use.      Medication List        Accurate as of 03/28/17 12:34 PM. Always use your most recent med list.          acetaminophen 500 MG tablet Commonly known as:  TYLENOL Take 2 tablets (1,000 mg total) by mouth every 6 (six) hours as needed for moderate pain.   cholecalciferol 1000 units tablet Commonly known as:  VITAMIN D Take 1,000 Units by mouth daily.   diphenoxylate-atropine 2.5-0.025 MG tablet Commonly known as:  LOMOTIL Take 1 tablet 4 (four) times daily as needed by mouth for diarrhea or loose stools.   ferrous  sulfate 325 (65 FE) MG tablet TAKE 1 TABLET (325 MG TOTAL) BY MOUTH 2 (TWO) TIMES DAILY WITH A MEAL.   meloxicam 15 MG tablet Commonly known as:  MOBIC Take 1 tablet (15 mg total) by mouth daily.   PRESERVISION AREDS 2 PO Take 1 capsule by mouth 2 (two) times daily.        Follow-up: Return if symptoms worsen or fail to improve.  Claretta Fraise, M.D.

## 2017-03-30 ENCOUNTER — Telehealth: Payer: Self-pay | Admitting: Family Medicine

## 2017-03-30 NOTE — Telephone Encounter (Signed)
Patient should start feeling the effects or benefits from meloxicam within a few days of using it.  If it is not working and she continues to have issues that we may have to send her to a specialist

## 2017-03-30 NOTE — Telephone Encounter (Signed)
Patient aware and verbalizes understanding. 

## 2017-04-01 NOTE — Progress Notes (Signed)
Ashley Peters  [phone] [phone]  Clinic Follow up Note   Patient Care Team: Dettinger, [name], MD as PCP - General (Family Medicine) Clent Jacks, MD as Consulting Physician (Ophthalmology) Zadie Rhine Clent Demark, MD as Consulting Physician (Ophthalmology) Donnie Mesa, MD as Consulting Physician (General Surgery) Truitt Merle, MD as Consulting Physician (Hematology)   Date of Service:  04/06/2017    CHIEF COMPLAINTS:  Follow up cancer of the right colon   Oncology History   Cancer Staging Cancer of right colon [facility]) Staging form: Colon and Rectum, AJCC 8th Edition - Pathologic stage from 10/14/2016: Stage IIC (pT4b, pN0, cM0) - Signed by Truitt Merle, MD on 11/07/2016       Cancer of right colon ([place])   09/19/2016 Imaging    CT A/P 09/19/16 IMPRESSION: Large 9.5 cm intraluminal mass in the cecum and ascending colon, consistent with colon carcinoma. Tumor extension into adjacent pericolonic fat seen along the lateral wall.  Mild right pericolonic and mesenteric lymphadenopathy, suspicious for metastatic disease. No other sites of metastatic disease identified.  Colonic diverticulosis, without radiographic evidence of diverticulitis. Tiny hiatal hernia, and small epigastric ventral hernia containing only fat.  Aortic atherosclerosis.      10/14/2016 Initial Diagnosis    Cancer of right colon ([place])      10/14/2016 Surgery    OPEN RIGHT HEMICOLECTOMY and REPAIR OF EPIGASTRIC VENTRAL HERNIA by Dr. Georgette Dover and Dr. Dalbert Batman       10/14/2016 Pathology Results    10/14/16 Diagnosis Colon, segmental resection for tumor, Right ADENOCARCINOMA WITH EXTENSIVE EXTRA CELLULAR MUCIN, GRADE 1, (11.0 CM) THE TUMOR INVADES THROUGH THE CECUM WALL INTO ADJACENT TERMINAL ILEUM (PT4B) NINETEEN BENIGN LYMPH NODES (0/19) SUPPURATIVE INFLAMMATION WITH FIBROSIS AND ADHESION TO ABDOMINAL WALL NO ADENOCARCINOMA IDENTIFIED      11/19/2016 Imaging    CT Chest  WO Contrast 11/19/16 IMPRESSION: 1. No evidence of metastatic disease. 2. Aortic atherosclerosis (ICD10-170.0). Coronary artery calcification.      11/25/2016 - 03/24/2017 Chemotherapy    Adjuvant Xeloda two weeks on, one week off starting 11/25/16 for 3 months        12/21/2016 Genetic Testing    Patient had genetic testing due to a personal history of colon cancer and a family history of breast, uterine, and colon cancer. The Common Hereditary Cancers Panel was ordered.  The Hereditary Gene Panel offered by [facility] includes sequencing and/or deletion duplication testing of the following 47 genes: APC, ATM, AXIN2, BARD1, BMPR1A, BRCA1, BRCA2, BRIP1, CDH1, CDKN2A (p14ARF), CDKN2A (p16INK4a), CHEK2, CDK4, CTNNA1, DICER1, EPCAM (Deletion/duplication testing only), GREM1 (promoter region deletion/duplication testing only), KIT, MEN1, MLH1, MSH2, MSH3, MSH6, MUTYH, NBN, NF1, NHTL1, PALB2, PDGFRA, PMS2, POLD1, POLE, PTEN, RAD50, RAD51C, RAD51D, SDHB, SDHC, SDHD, SMAD4, SMARCA4. STK11, TP53, TSC1, TSC2, and VHL.  The following genes were evaluated for sequence changes only: SDHA and HOXB13 c.251G>A variant only.  Results: Negative- no pathogenic variants identified.  The date of this test report is 12/21/2016.         HISTORY OF PRESENTING ILLNESS:  Ashley Peters 84 y.o. female is here because of cancer of the right colon. She presents with her daughter and husband. Initially, pt presented to her PCP's office on 09/19/16 for a 68mocheck-up and with complaints of worsening right lower quadrant pain that had been ongoing for several weeks. This was accompanied by fatigue, chills, and weight loss of approximately 30lbs over the past year. However, she reports that she was enrolled in a  nutrition program for diabetics to help her lose weight (she is only borderline diabetic). Her daughter was able to notice her fatigue as well. She had not noticed any bloody stools, however, she did notice some melena but  was on iron supplements for her anemia. She denies any significant constipation with this. She had also noticed a small area of distension over her area of pain in the right lower abdomen which she had not noticed previously. CT A/P was performed the same day which was remarkable for a large 9.5cm intraluminal mass in the cecum and ascending colon, consistent with colon carcinoma. There was tumor extension into the adjacent pericolonic fat seen along the lateral abdominal wall as well. Additionally, mild right pericolonic and mesenteric lymphadenopathy was also seen which was suspicious for metastatic disease. An epigastric ventral hernia was also found to be present on this scan.   Following these findings, she was subsequently referred to Dr Georgette Dover who performed both an open right hemicolectomy and epigastric ventral hernia repair on 10/14/16. She tolerated this procedure well and was monitored in the hospital until her discharge on 10/17/16, during which she recovered well. She was referred to medical oncology following her surgery and she has been recovering well since d/c. Since her surgery she denies any pain returning to the area. Her bowel movements have been normal and regular since her surgery and her appetite has been well. Her fatigue has also been improving and back to normal.   She currently lives alone in [place], [place] and very independent and active.    CURRENT THERAPY:  surveillance   INTERVAL HISTORY:   Ashley Peters is here for a follow up. She reports she is not doing well overall. She has been having pain in her left anterior thigh that stretches down to her knee. She describes pain as a deep aching that is worse at night (10/10 pain) when she is laying down. She was seen by her PCP on 03/28/17 and given meloxicam, which she states has not helped. She went to the ED yesterday where she had a duplex ultrasound that was negative for DVT and an X-Ray that was also negative. She was given  Robaxin in the ED and she states that this does help her pain. She recently finished her last cycle of Xeloda on 03/24/17 and reports no abdominal problems.   On review of systems, pt denies abdominal pain, or any other complaints at this time. Pertinent positives are listed and detailed within the above HPI.     MEDICAL HISTORY:  Past Medical History:  Diagnosis Date  . Anemia   . Cancer ([place])   . Colon cancer ([place])   . Diverticulosis   . Family history of breast cancer   . Family history of colon cancer   . Family history of uterine cancer   . GERD (gastroesophageal reflux disease)   . History of kidney stones   . Hyperlipidemia   . Hypertension   . Macular degeneration   . Osteopenia     SURGICAL HISTORY: Past Surgical History:  Procedure Laterality Date  . ABDOMINAL HYSTERECTOMY    . ablation tr Great saphenous vein  10/17/2009  . BREAST EXCISIONAL BIOPSY Bilateral 1971  . brest biopsy  1971  . CHOLECYSTECTOMY    . EPIGASTRIC HERNIA REPAIR N/A 10/14/2016   Procedure: REPAIR OF EPIGASTRIC VENTRAL HERNIA;  Surgeon: Donnie Mesa, MD;  Location: [place];  Service: General;  Laterality: N/A;  . lt. distal ureteral stone extraction  09/14/1991  . PARTIAL COLECTOMY Right 10/14/2016   Procedure: OPEN RIGHT HEMICOLECTOMY;  Surgeon: Donnie Mesa, MD;  Location: [place];  Service: General;  Laterality: Right;    SOCIAL HISTORY: Social History   Socioeconomic History  . Marital status: Widowed    Spouse name: Not on file  . Number of children: Not on file  . Years of education: Not on file  . Highest education level: Not on file  Social Needs  . Financial resource strain: Not on file  . Food insecurity - worry: Not on file  . Food insecurity - inability: Not on file  . Transportation needs - medical: Not on file  . Transportation needs - non-medical: Not on file  Occupational History  . Not on file  Tobacco Use  . Smoking status: Never Smoker  . Smokeless tobacco: Never  Used  Substance and Sexual Activity  . Alcohol use: No  . Drug use: No  . Sexual activity: Yes  Other Topics Concern  . Not on file  Social History Narrative  . Not on file    FAMILY HISTORY: Family History  Problem Relation Age of Onset  . Stroke Father   . Breast cancer Sister        dx 44's, died at 71  . Diabetes Brother   . Kidney disease Brother   . Heart disease Brother   . Epilepsy Son   . Colon cancer Other 77  . Uterine cancer Other 69  . Hodgkin's lymphoma Other     ALLERGIES:  is allergic to benicar [olmesartan medoxomil] and amoxicillin.  MEDICATIONS:  Current Outpatient Medications  Medication Sig Dispense Refill  . acetaminophen (TYLENOL) 500 MG tablet Take 2 tablets (1,000 mg total) by mouth every 6 (six) hours as needed for moderate pain. 30 tablet 0  . cholecalciferol (VITAMIN D) 1000 units tablet Take 1,000 Units by mouth daily.    . diphenoxylate-atropine (LOMOTIL) 2.5-0.025 MG tablet Take 1 tablet 4 (four) times daily as needed by mouth for diarrhea or loose stools. 60 tablet 0  . ferrous sulfate 325 (65 FE) MG tablet TAKE 1 TABLET (325 MG TOTAL) BY MOUTH 2 (TWO) TIMES DAILY WITH A MEAL. 60 tablet 0  . hydrocortisone cream 1 % Apply 1 application topically as needed for itching.    . Lidocaine-Glycerin (PREPARATION H) 5-14.4 % CREA Place 1 application rectally as needed (itching).    . meloxicam (MOBIC) 15 MG tablet Take 1 tablet (15 mg total) by mouth daily. (Patient not taking: Reported on 03/28/2017) 30 tablet 0  . Menthol-Camphor (ICY HOT ARTHRITIS PAIN RELIEF EX) Apply 1 application topically as needed (joint pain).    . methocarbamol (ROBAXIN) 500 MG tablet Take 1 tablet (500 mg total) by mouth 2 (two) times daily. 20 tablet 0  . Multiple Vitamins-Minerals (PRESERVISION AREDS 2 PO) Take 1 capsule by mouth 2 (two) times daily.    . traMADol (ULTRAM) 50 MG tablet Take 1 tablet (50 mg total) by mouth every 6 (six) hours as needed. 15 tablet 0    Current Facility-Administered Medications  Medication Dose Route Frequency Provider Last Rate Last Dose  . cyanocobalamin ((VITAMIN B-12)) injection 1,000 mcg  1,000 mcg Intramuscular Q30 days Dettinger, [name], MD   1,000 mcg at 03/24/17 1131    REVIEW OF SYSTEMS:   Constitutional: Denies fevers, chills or abnormal night sweats  Eyes: Denies blurriness of vision, double vision or watery eyes Ears, nose, mouth, throat, and face: Denies mucositis or sore  throat Respiratory: Denies cough, dyspnea or wheezes Cardiovascular: Denies palpitation, chest discomfort (+) lower extremity swelling of ankles  Gastrointestinal:  Denies heartburn  (+) hemorrhoids (+) moderate diarrhea/watery stool Skin: Denies abnormal skin rashes  Lymphatics: Denies new lymphadenopathy or easy bruising Neurological:Denies numbness, tingling or new weaknesses Behavioral/Psych: Mood is stable, no new changes  MSK: (+) myalgias in her left leg All other systems were reviewed with the patient and are negative.  PHYSICAL EXAMINATION:  ECOG PERFORMANCE STATUS: 1  Vitals:   04/06/17 0835  BP: (!) 163/71  Pulse: 73  Resp: 20  Temp: 98.4 F (36.9 C)  SpO2: 99%   Filed Weights   04/06/17 0835  Weight: 120 lb 9.6 oz (54.7 kg)     GENERAL:alert, no distress and comfortable SKIN: skin color, texture, turgor are normal, no rashes or significant lesions EYES: normal, conjunctiva are pink and non-injected, sclera clear OROPHARYNX:no exudate, no erythema and lips, buccal mucosa, and tongue normal  NECK: supple, thyroid normal size, non-tender, without nodularity LYMPH:  no palpable lymphadenopathy in the cervical, axillary or inguinal LUNGS: clear to auscultation and percussion with normal breathing effort HEART: regular rhythm and no murmurs (+) mild pitting edema on both lower extremity up to mid calf  ABDOMEN:abdomen soft, non-tender and normal bowel sounds. Midline surgical incision has healed  well. Musculoskeletal:no cyanosis of digits and no clubbing  PSYCH: alert & oriented x 3 with fluent speech NEURO: no focal motor/sensory deficits  LABORATORY DATA:  I have reviewed the data as listed CBC Latest Ref Rng & Units 04/06/2017 04/05/2017 03/23/2017  WBC 3.9 - 10.3 K/uL 7.3 7.0 6.3  Hemoglobin 12.0 - 15.0 g/dL - 12.9 13.3  Hematocrit 34.8 - 46.6 % 42.3 39.7 39.9  Platelets 145 - 400 K/uL 199 189 262   CMP Latest Ref Rng & Units 04/06/2017 04/05/2017 03/23/2017  Glucose 70 - 140 mg/dL 90 91 96  BUN 7 - 26 mg/dL _0 Creatinine 0.60 - 1.10 mg/dL 0.69 0.56 0.61  Sodium 136 - 145 mmol/L 142 141 144  Potassium 3.3 - 4.7 mmol/L 4.0 3.9 5.2  Chloride 98 - 109 mmol/L 108 109 108(H)  CO2 22 - 29 mmol/L _1 Calcium 8.4 - 10.4 mg/dL 9.3 9.0 9.5  Total Protein 6.4 - 8.3 g/dL 6.4 5.3(L) 6.6  Total Bilirubin 0.2 - 1.2 mg/dL 0.6 1.0 0.6  Alkaline Phos 40 - 150 U/L 82 61 71  AST 5 - 34 U/L _2 ALT 0 - 55 U/L _3 PATHOLOGY REPORTS:   10/14/16 Diagnosis Colon, segmental resection for tumor, Right ADENOCARCINOMA WITH EXTENSIVE EXTRA CELLULAR MUCIN, GRADE 1, (11.0 CM) THE TUMOR INVADES THROUGH THE CECUM WALL INTO ADJACENT TERMINAL ILEUM (PT4B) NINETEEN BENIGN LYMPH NODES (0/19) SUPPURATIVE INFLAMMATION WITH FIBROSIS AND ADHESION TO ABDOMINAL WALL NO ADENOCARCINOMA IDENTIFIED Microscopic Comment COLON AND RECTUM (INCLUDING TRANS-ANAL RESECTION): Specimen: Right colon with terminal ileum Procedure: Segmental resection Tumor site: Cecum and terminal ileum Specimen integrity: Intact Macroscopic intactness of mesorectum: Not applicable: X Complete: NA Near complete: NA Incomplete: NA Cannot be determined (specify): NA Macroscopic tumor perforation: Invades adjacent structure Invasive tumor: Maximum size: 11.0 cm Histologic type(s): Adenocarcinoma with extracellular mucin Histologic grade and differentiation: G1: well differentiated/low grade G2:  moderately differentiated/low grade G3: poorly differentiated/high grade G4: undifferentiated/high grade Type of polyp in which invasive carcinoma arose: Tubular adenoma Microscopic extension of invasive tumor: Invades adjacent terminal ileum 1 of 5 Supplemental copy SUPPLEMENTAL for  SHAHD, OCCHIPINTI [phone]) Microscopic Comment(continued) Lymph-Vascular invasion: Negative Peri-neural invasion: Negative Tumor deposit(s) (discontinuous extramural extension): Negative Resection margins: Proximal margin: Negative Distal margin: Negative Circumferential (radial) (posterior ascending, posterior descending; lateral and posterior mid-rectum; and entire lower 1/3 rectum):NA Mesenteric margin (sigmoid and transverse): NA Distance closest margin (if all above margins negative): 1.5 cm from abdominal wall adhesion resection Trans-anal resection margins only: Deep margin: NA Mucosal Margin: NA Distance closest mucosal margin (if negative): NA Treatment effect (neo-adjuvant therapy): Negative Additional polyp(s): Negative Non-neoplastic findings: Unremarkable Lymph nodes: number examined 19; number positive: 0 Pathologic Staging: pT4b, pN0, pMx Ancillary studies: Ordered  RADIOGRAPHIC STUDIES: I have personally reviewed the radiological images as listed and agreed with the findings in the report.  Dg Pelvis 1-2 Views  Result Date: 04/05/2017 CLINICAL DATA:  Left thigh pain, no known injury, initial encounter EXAM: PELVIS - 1-2 VIEW COMPARISON:  None. FINDINGS: Pelvic ring is intact. Mild degenerative changes of the hip joints are noted. No acute fracture is noted. No soft tissue abnormality is seen. IMPRESSION: No acute abnormality noted. Electronically Signed   By: Inez Catalina M.D.   On: 04/05/2017 11:27   Dg Femur Min 2 Views Left  Result Date: 04/05/2017 CLINICAL DATA:  Left thigh pain, no known injury, initial encounter EXAM: LEFT FEMUR 2 VIEWS COMPARISON:  None. FINDINGS: There is  no evidence of fracture or other focal bone lesions. Soft tissues are unremarkable. Mild degenerative changes of the left hip joint are noted. IMPRESSION: No acute abnormality noted. Electronically Signed   By: Inez Catalina M.D.   On: 04/05/2017 11:25     CT Chest WO Contrast 11/19/16 IMPRESSION: 1. No evidence of metastatic disease. 2. Aortic atherosclerosis (ICD10-170.0). Coronary artery calcification.  CT A/P 09/19/16 IMPRESSION: Large 9.5 cm intraluminal mass in the cecum and ascending colon, consistent with colon carcinoma. Tumor extension into adjacent pericolonic fat seen along the lateral wall.  Mild right pericolonic and mesenteric lymphadenopathy, suspicious for metastatic disease. No other sites of metastatic disease identified.  Colonic diverticulosis, without radiographic evidence of diverticulitis. Tiny hiatal hernia, and small epigastric ventral hernia containing only fat.  Aortic atherosclerosis.  ASSESSMENT: 84 y.o. 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 She was found to have right-sided colon cancer beginning in the cecum. She did have an impressively large at 11cm tumor which invaded around the adjacent small intestine which was removed without complication by Dr Georgette Dover. Her surgical margins were negative. All 19 lymph nodes were negative. -I reviewed her staging CT abd/pel scan and chest, which was negative for metastatic disease.  -She had stage II disease, early-stage but with high risk features, because of her T4b tumor  - I discussed her high recurrence risk due to T4b lesion, especially local recurrence  -Given her high risk of recurrence, I recommend her to consider adjuvant Xeloda. She is 38, but a very healthy and functional, she may be able tolerate Xeloda well. Due to her advanced age, I recommend her to take  Xeloda for 3 months, instead of standard 6 months therapy.  -She has started adjuvant Xeloda on 11/25/16, tolerated her first cycle very well -She did not tolerated cycle 2 Xeloda well due to diarrhea and nausea. She was dehydrated and mildly tachycardic. She was given IV fluids and prescribed lomotil.  -Held Xeloda since 01/05/17 to give time to recover. -  She re-started Xeloda at suggested low dose 1050m BID (1 week on and 1 weeks off) on 02/10/17, tolerating well overall  -She has completed adjuvant Xeloda (a total of 3 months) -she is doing well overall except her left thigh pain, which is new to her.  I am not highly suspicious that her leg pain is related to bone metastasis, I have reviewed her X-ray from the ED. I plan to do a surveilling CT scan after she completes adjuvant chemo, I will order a CT abd/pel today for her to have done in 3 weeks. May consider whole body bone scan if her CT scan negative and she has persistent pain.   -Lab and f/u in 4 weeks, will order CT scan today   2.) Genetics -Due to her prevalent family history of cancer, I previously suggested that she should follow up with genetics for testing. She would like to have this performed.  -She does have two sons and one daughter, I also informed her that if her genetic testing were to come back with abnormal results that her children should be tested as well.  -Also advised that her children should have routine colonoscopies.  -She completed genetic testing 12/03/16 and her results are negative  3.) Anemia  -We will monitor her Hgb and Iron levels. If these continue to worsen or do not improve, we will consider infusion to aid with this.  -I previously encouraged her to continue taking her iron supplements at home.   -We will monitor this with her lab work.  -As of 12/03/16 her Hg is 13.5, and Ferritin is normal at 61.  -Hg resolved at 13.5 as of 12/15/16 -She will continue iron supplement for one more month and then she  can stop.  -She is currently off oral iron   4.) Borderline DM and HTN: -She was on BP medication prior to her weight loss, she was taken off of this following her weight loss.   -Continue with weight loss plan for management and I encouraged her to maintain a good diet to manage her sugars.  -Advised the patient to monitor her blood pressure at home. Patient blood pressure today is 182/75 -She will continue f/u w/ her PCP for this.   5) Left thigh pain -She has developed left side pain in the past 3-4 weeks, more noticeable when she lays down, improves when she walks.  No leg swelling or other new complaints.  X-ray and Doppler were negative. -She will continue her anti-inflammatory medication prescribed by her PCP and the ED physician. --I prescribed 15 pills of tramadol today for her as needed for severe pain.   PLAN:  F/u in 4 weeks CT abd/pel w contrast scan in 3 weeks  Prescribed Tramadol today      No orders of the defined types were placed in this encounter.   All questions were answered. The patient knows to call the clinic with any problems, questions or concerns.I spent 20 minutes counseling the patient face to face. The total time spent in the appointment was 25 minutes and more than 50% was on counseling.   This document serves as a record of services personally performed by YTruitt Merle MD. It was created on her behalf by DTheresia Bough a trained medical scribe. The creation of this record is based on the scribe's personal observations and the provider's statements to them.   I have reviewed the above documentation for accuracy and completeness, and I agree with the above.  Truitt Merle, MD 04/06/2017 11:05 AM

## 2017-04-05 ENCOUNTER — Emergency Department (HOSPITAL_BASED_OUTPATIENT_CLINIC_OR_DEPARTMENT_OTHER)
Admit: 2017-04-05 | Discharge: 2017-04-05 | Disposition: A | Discharge status: Home / Self Care | Payer: Medicare Other | Attending: Emergency Medicine | Admitting: Emergency Medicine

## 2017-04-05 ENCOUNTER — Emergency Department (HOSPITAL_COMMUNITY)
Admission: EM | Admit: 2017-04-05 | Discharge: 2017-04-05 | Disposition: A | Discharge status: Home / Self Care | Payer: Medicare Other | Attending: Emergency Medicine | Admitting: Emergency Medicine

## 2017-04-05 ENCOUNTER — Other Ambulatory Visit: Payer: Self-pay

## 2017-04-05 ENCOUNTER — Encounter (HOSPITAL_COMMUNITY): Payer: Self-pay

## 2017-04-05 ENCOUNTER — Other Ambulatory Visit: Payer: Self-pay | Admitting: Hematology

## 2017-04-05 ENCOUNTER — Emergency Department (HOSPITAL_COMMUNITY): Payer: Medicare Other

## 2017-04-05 DIAGNOSIS — M79605 Pain in left leg: Secondary | ICD-10-CM | POA: Insufficient documentation

## 2017-04-05 DIAGNOSIS — Z85038 Personal history of other malignant neoplasm of large intestine: Secondary | ICD-10-CM | POA: Diagnosis not present

## 2017-04-05 DIAGNOSIS — I10 Essential (primary) hypertension: Secondary | ICD-10-CM | POA: Diagnosis not present

## 2017-04-05 DIAGNOSIS — C182 Malignant neoplasm of ascending colon: Secondary | ICD-10-CM

## 2017-04-05 DIAGNOSIS — R52 Pain, unspecified: Secondary | ICD-10-CM

## 2017-04-05 DIAGNOSIS — M79652 Pain in left thigh: Secondary | ICD-10-CM | POA: Diagnosis not present

## 2017-04-05 DIAGNOSIS — M79609 Pain in unspecified limb: Secondary | ICD-10-CM

## 2017-04-05 DIAGNOSIS — Z79899 Other long term (current) drug therapy: Secondary | ICD-10-CM | POA: Insufficient documentation

## 2017-04-05 LAB — COMPREHENSIVE METABOLIC PANEL
ALT: 18 U/L (ref 14–54)
ANION GAP: 9 (ref 5–15)
AST: 29 U/L (ref 15–41)
Albumin: 3.4 g/dL — ABNORMAL LOW (ref 3.5–5.0)
Alkaline Phosphatase: 61 U/L (ref 38–126)
BUN: 15 mg/dL (ref 6–20)
CHLORIDE: 109 mmol/L (ref 101–111)
CO2: 23 mmol/L (ref 22–32)
Calcium: 9 mg/dL (ref 8.9–10.3)
Creatinine, Ser: 0.56 mg/dL (ref 0.44–1.00)
Glucose, Bld: 91 mg/dL (ref 65–99)
POTASSIUM: 3.9 mmol/L (ref 3.5–5.1)
Sodium: 141 mmol/L (ref 135–145)
Total Bilirubin: 1 mg/dL (ref 0.3–1.2)
Total Protein: 5.3 g/dL — ABNORMAL LOW (ref 6.5–8.1)

## 2017-04-05 LAB — CBC WITH DIFFERENTIAL/PLATELET
BASOS ABS: 0.1 10*3/uL (ref 0.0–0.1)
Basophils Relative: 1 %
Eosinophils Absolute: 0.1 10*3/uL (ref 0.0–0.7)
Eosinophils Relative: 2 %
HEMATOCRIT: 39.7 % (ref 36.0–46.0)
HEMOGLOBIN: 12.9 g/dL (ref 12.0–15.0)
LYMPHS ABS: 1.6 10*3/uL (ref 0.7–4.0)
LYMPHS PCT: 23 %
MCH: 31.1 pg (ref 26.0–34.0)
MCHC: 32.5 g/dL (ref 30.0–36.0)
MCV: 95.7 fL (ref 78.0–100.0)
Monocytes Absolute: 0.4 10*3/uL (ref 0.1–1.0)
Monocytes Relative: 6 %
NEUTROS ABS: 4.8 10*3/uL (ref 1.7–7.7)
NEUTROS PCT: 68 %
Platelets: 189 10*3/uL (ref 150–400)
RBC: 4.15 MIL/uL (ref 3.87–5.11)
RDW: 14.9 % (ref 11.5–15.5)
WBC: 7 10*3/uL (ref 4.0–10.5)

## 2017-04-05 MED ORDER — METHOCARBAMOL 500 MG PO TABS: 500.0000 mg | ORAL_TABLET | Freq: Two times a day (BID) | ORAL | 0 refills | Status: DC

## 2017-04-05 NOTE — ED Triage Notes (Signed)
Pt presents for evaluation of left anterior thigh pain x 1 month. Reports has seen pcp x 2 and given medication with no improvement. Pt reports cramping sensation and throbbing. Denies swelling. Reports pain is worse at night. Pt is able to ambulate. Denies falls or trauma.

## 2017-04-05 NOTE — Discharge Instructions (Signed)
There is no evidence of blood clot in the leg on ultrasound.  There were no acute abnormalities on the x-rays.  Lab results were reassuring. Robaxin as a muscle relaxer and may help loosen cramping or stiffened muscles.  Please use caution with this medication as it can cause sleepiness and difficulty walking. Please follow-up with your oncologist and primary care provider on this matter.

## 2017-04-05 NOTE — ED Notes (Signed)
Patient transported to X-ray 

## 2017-04-05 NOTE — Progress Notes (Signed)
Left lower extremity venous duplex completed. No evidence of DVT, superficial thrombosis, or Baker's cyst. No change since study of 02/2017 completed at [facility]. Toma Copier, [place]  04/05/2017, 1:37PM

## 2017-04-05 NOTE — ED Provider Notes (Signed)
[facility] EMERGENCY DEPARTMENT Provider Note   CSN: [id number] Arrival date & time: 04/05/17  7628     History   Chief Complaint Chief Complaint  Patient presents with  . Leg Pain    HPI Ashley Peters is a 84 y.o. female.  HPI   Ashley Peters is a 84 y.o. female, with a history of colon cancer and hypertension, presenting to the ED with left upper leg pain worsening over the last month.  Describes the pain as a deep aching, anterior thigh, worsens overnight, currently 4/10, does not include the hip or pelvis.  Patient has been seen by her PCP for this twice and was prescribed meloxicam without improvement. Patient's history is significant for recent colon cancer.  She had a tumor resection performed by Dr. Georgette Dover in August 2018, treated with oral chemotherapy by Dr. Burr Medico, and finished her therapy about a month ago, right before her current pain began.  Patient has an appointment with Dr. Burr Medico tomorrow.  Denies fever/chills, night sweats, CP, SOB, abdominal pain, leg swelling, neuro deficits, trauma/falls, urinary complaints, N/V/D, or any other complaints.    Past Medical History:  Diagnosis Date  . Anemia   . Cancer ([place])   . Colon cancer ([place])   . Diverticulosis   . Family history of breast cancer   . Family history of colon cancer   . Family history of uterine cancer   . GERD (gastroesophageal reflux disease)   . History of kidney stones   . Hyperlipidemia   . Hypertension   . Macular degeneration   . Osteopenia     Patient Active Problem List   Diagnosis Date Noted  . B12 deficiency 03/24/2017  . Genetic testing 12/23/2016  . Family history of colon cancer   . Family history of uterine cancer   . Family history of breast cancer   . Iron deficiency anemia due to chronic blood loss 11/07/2016  . Cancer of right colon ([place]) 10/14/2016  . Hyperlipidemia 11/01/2014  . HTN (hypertension) 07/15/2010  . GERD (gastroesophageal reflux disease)  07/15/2010  . Osteopenia 07/15/2010  . Diverticulosis 07/15/2010    Past Surgical History:  Procedure Laterality Date  . ABDOMINAL HYSTERECTOMY    . ablation tr Great saphenous vein  10/17/2009  . BREAST EXCISIONAL BIOPSY Bilateral 1971  . brest biopsy  1971  . CHOLECYSTECTOMY    . EPIGASTRIC HERNIA REPAIR N/A 10/14/2016   Procedure: REPAIR OF EPIGASTRIC VENTRAL HERNIA;  Surgeon: Donnie Mesa, MD;  Location: [facility];  Service: General;  Laterality: N/A;  . lt. distal ureteral stone extraction  09/14/1991  . PARTIAL COLECTOMY Right 10/14/2016   Procedure: OPEN RIGHT HEMICOLECTOMY;  Surgeon: Donnie Mesa, MD;  Location: [facility];  Service: General;  Laterality: Right;    OB History    No data available       Home Medications    Prior to Admission medications   Medication Sig Start Date End Date Taking? Authorizing Provider  acetaminophen (TYLENOL) 500 MG tablet Take 2 tablets (1,000 mg total) by mouth every 6 (six) hours as needed for moderate pain. 10/17/16  Yes Donnie Mesa, MD  cholecalciferol (VITAMIN D) 1000 units tablet Take 1,000 Units by mouth daily.   Yes [provider]  diphenoxylate-atropine (LOMOTIL) 2.5-0.025 MG tablet Take 1 tablet 4 (four) times daily as needed by mouth for diarrhea or loose stools. 01/19/17  Yes Truitt Merle, MD  ferrous sulfate 325 (65 FE) MG tablet TAKE 1 TABLET (  325 MG TOTAL) BY MOUTH 2 (TWO) TIMES DAILY WITH A MEAL. 03/21/17  Yes Dettinger, [name], MD  hydrocortisone cream 1 % Apply 1 application topically as needed for itching.   Yes [provider]  Lidocaine-Glycerin (PREPARATION H) 5-14.4 % CREA Place 1 application rectally as needed (itching).   Yes [provider]  Menthol-Camphor (ICY HOT ARTHRITIS PAIN RELIEF EX) Apply 1 application topically as needed (joint pain).   Yes [provider]  Multiple Vitamins-Minerals (PRESERVISION AREDS 2 PO) Take 1 capsule by mouth 2 (two) times daily.   Yes [provider]  meloxicam (MOBIC) 15 MG tablet Take 1 tablet (15 mg total) by mouth daily. Patient not taking: Reported on 03/28/2017 03/27/17   Dettinger, [name], MD  methocarbamol (ROBAXIN) 500 MG tablet Take 1 tablet (500 mg total) by mouth 2 (two) times daily. 04/05/17   Skylie Hiott, Helane Gunther, PA-C    Family History Family History  Problem Relation Age of Onset  . Stroke Father   . Breast cancer Sister        dx 36's, died at 86  . Diabetes Brother   . Kidney disease Brother   . Heart disease Brother   . Epilepsy Son   . Colon cancer Other 31  . Uterine cancer Other 60  . Hodgkin's lymphoma Other     Social History Social History   Tobacco Use  . Smoking status: Never Smoker  . Smokeless tobacco: Never Used  Substance Use Topics  . Alcohol use: No  . Drug use: No     Allergies   Benicar [olmesartan medoxomil] and Amoxicillin   Review of Systems Review of Systems  Constitutional: Negative for chills, diaphoresis and fever.  Respiratory: Negative for shortness of breath.   Cardiovascular: Negative for chest pain and leg swelling.  Gastrointestinal: Negative for abdominal pain, diarrhea, nausea and vomiting.  Musculoskeletal: Positive for myalgias. Negative for back pain.  Skin: Negative for color change and pallor.  Neurological: Negative for weakness and numbness.  All other systems reviewed and are negative.    Physical Exam Updated Vital Signs BP (!) 170/76 (BP Location: Left Arm)   Pulse 71   Temp 98.2 F (36.8 C) (Oral)   Resp 16   SpO2 100%   Physical Exam  Constitutional: She appears well-developed and well-nourished. No distress.  HENT:  Head: Normocephalic and atraumatic.  Eyes: Conjunctivae are normal.  Neck: Neck supple.  Cardiovascular: Normal rate, regular rhythm, normal heart sounds and intact distal pulses.  Pulmonary/Chest: Effort normal and breath sounds normal. No respiratory distress.  Abdominal: Soft. There is no tenderness. There is no  guarding.  Musculoskeletal: She exhibits no edema.  Some pain able to be elicited with deep palpation of the anterior left upper leg throughout the distribution of the femur, but seemingly centered more in the middle to distal thirds.  No noted swelling, erythema, increased warmth, deformity, wounds, or other abnormality. Full range of motion without pain through the cardinal directions of the left hip and knee. No tenderness to the pelvis, hips, or knees.   Lymphadenopathy:    She has no cervical adenopathy.  Neurological: She is alert.  No noted acute sensory deficits. Strength 5/5 with flexion and extension at the bilateral hips, knees, and ankles. No noted gait deficit. Coordination intact with heel to shin testing.  Skin: Skin is warm and dry. Capillary refill takes less than 2 seconds. She is not diaphoretic. No erythema.  Psychiatric: She has a  normal mood and affect. Her behavior is normal.  Nursing note and vitals reviewed.    ED Treatments / Results  Labs (all labs ordered are listed, but only abnormal results are displayed) Labs Reviewed  COMPREHENSIVE METABOLIC PANEL - Abnormal; Notable for the following components:      Result Value   Total Protein 5.3 (*)    Albumin 3.4 (*)    All other components within normal limits  CBC WITH DIFFERENTIAL/PLATELET    EKG  EKG Interpretation None       Radiology Dg Pelvis 1-2 Views  Result Date: 04/05/2017 CLINICAL DATA:  Left thigh pain, no known injury, initial encounter EXAM: PELVIS - 1-2 VIEW COMPARISON:  None. FINDINGS: Pelvic ring is intact. Mild degenerative changes of the hip joints are noted. No acute fracture is noted. No soft tissue abnormality is seen. IMPRESSION: No acute abnormality noted. Electronically Signed   By: Inez Catalina M.D.   On: 04/05/2017 11:27   Dg Femur Min 2 Views Left  Result Date: 04/05/2017 CLINICAL DATA:  Left thigh pain, no known injury, initial encounter EXAM: LEFT FEMUR 2 VIEWS  COMPARISON:  None. FINDINGS: There is no evidence of fracture or other focal bone lesions. Soft tissues are unremarkable. Mild degenerative changes of the left hip joint are noted. IMPRESSION: No acute abnormality noted. Electronically Signed   By: Inez Catalina M.D.   On: 04/05/2017 11:25    Procedures Procedures (including critical care time)  Medications Ordered in ED Medications - No data to display   Initial Impression / Assessment and Plan / ED Course  I have reviewed the triage vital signs and the nursing notes.  Pertinent labs & imaging results that were available during my care of the patient were reviewed by me and considered in my medical decision making (see chart for details).     Patient presents with left anterior thigh pain for the last month.  In light of the patient's recent cancer history and the patient's pain coinciding with the cessation of her chemotherapy, more in depth investigation is warranted. No noted DVT on duplex ultrasound.  No acute abnormalities on the x-rays.  No evidence of anemia.  No leukocytosis.  Calcium and potassium within normal limits.  Afebrile. Patient's workup here is grossly negative for acute abnormalities. PCP and oncology follow up. The patient was given instructions for home care as well as return precautions. Patient voices understanding of these instructions, accepts the plan, and is comfortable with discharge.  Findings and plan of care discussed with Theotis Burrow, MD. Dr. Rex Kras personally evaluated and examined this patient.  Vitals:   04/05/17 1100 04/05/17 1130 04/05/17 1200 04/05/17 1215  BP: (!) 146/58 130/68 135/69 (!) 152/68  Pulse: 62 62 64 70  Resp:      Temp:      TempSrc:      SpO2: 98% 96% 95% 95%     Final Clinical Impressions(s) / ED Diagnoses   Final diagnoses:  Left leg pain    ED Discharge Orders        Ordered    methocarbamol (ROBAXIN) 500 MG tablet  2 times daily     04/05/17 [place],  Comfort, PA-C 04/05/17 1441    Little, Wenda Overland, MD 04/06/17 1625

## 2017-04-06 ENCOUNTER — Encounter: Payer: Self-pay | Admitting: Hematology

## 2017-04-06 ENCOUNTER — Inpatient Hospital Stay: Payer: Medicare Other | Attending: Hematology | Admitting: Hematology

## 2017-04-06 ENCOUNTER — Telehealth: Payer: Self-pay | Admitting: Hematology

## 2017-04-06 ENCOUNTER — Inpatient Hospital Stay: Payer: Medicare Other

## 2017-04-06 VITALS — BP 163/71 | HR 73 | Temp 98.4°F | Resp 20 | Ht <= 58 in | Wt 120.6 lb

## 2017-04-06 DIAGNOSIS — D649 Anemia, unspecified: Secondary | ICD-10-CM | POA: Insufficient documentation

## 2017-04-06 DIAGNOSIS — I2510 Atherosclerotic heart disease of native coronary artery without angina pectoris: Secondary | ICD-10-CM | POA: Insufficient documentation

## 2017-04-06 DIAGNOSIS — C182 Malignant neoplasm of ascending colon: Secondary | ICD-10-CM

## 2017-04-06 DIAGNOSIS — C188 Malignant neoplasm of overlapping sites of colon: Secondary | ICD-10-CM | POA: Diagnosis not present

## 2017-04-06 DIAGNOSIS — M79652 Pain in left thigh: Secondary | ICD-10-CM | POA: Insufficient documentation

## 2017-04-06 DIAGNOSIS — I700 Atherosclerosis of aorta: Secondary | ICD-10-CM | POA: Diagnosis not present

## 2017-04-06 DIAGNOSIS — D500 Iron deficiency anemia secondary to blood loss (chronic): Secondary | ICD-10-CM

## 2017-04-06 DIAGNOSIS — I10 Essential (primary) hypertension: Secondary | ICD-10-CM | POA: Diagnosis not present

## 2017-04-06 DIAGNOSIS — K439 Ventral hernia without obstruction or gangrene: Secondary | ICD-10-CM

## 2017-04-06 LAB — CBC WITH DIFFERENTIAL (CANCER CENTER ONLY)
Basophils Absolute: 0.1 10*3/uL (ref 0.0–0.1)
Basophils Relative: 1 %
EOS PCT: 2 %
Eosinophils Absolute: 0.1 10*3/uL (ref 0.0–0.5)
HEMATOCRIT: 42.3 % (ref 34.8–46.6)
Hemoglobin: 13.9 g/dL (ref 11.6–15.9)
Lymphocytes Relative: 19 %
Lymphs Abs: 1.4 10*3/uL (ref 0.9–3.3)
MCH: 31.7 pg (ref 25.1–34.0)
MCHC: 32.9 g/dL (ref 31.5–36.0)
MCV: 96.6 fL (ref 79.5–101.0)
MONO ABS: 0.4 10*3/uL (ref 0.1–0.9)
MONOS PCT: 6 %
NEUTROS ABS: 5.3 10*3/uL (ref 1.5–6.5)
NEUTROS PCT: 72 %
Platelet Count: 199 10*3/uL (ref 145–400)
RBC: 4.38 MIL/uL (ref 3.70–5.45)
RDW: 15 % (ref 11.2–16.1)
WBC: 7.3 10*3/uL (ref 3.9–10.3)

## 2017-04-06 LAB — FERRITIN: Ferritin: 76 ng/mL (ref 9–269)

## 2017-04-06 LAB — COMPREHENSIVE METABOLIC PANEL
ALK PHOS: 82 U/L (ref 40–150)
ALT: 24 U/L (ref 0–55)
ANION GAP: 7 (ref 3–11)
AST: 25 U/L (ref 5–34)
Albumin: 3.8 g/dL (ref 3.5–5.0)
BILIRUBIN TOTAL: 0.6 mg/dL (ref 0.2–1.2)
BUN: 16 mg/dL (ref 7–26)
CALCIUM: 9.3 mg/dL (ref 8.4–10.4)
CO2: 27 mmol/L (ref 22–29)
CREATININE: 0.69 mg/dL (ref 0.60–1.10)
Chloride: 108 mmol/L (ref 98–109)
Glucose, Bld: 90 mg/dL (ref 70–140)
Potassium: 4 mmol/L (ref 3.3–4.7)
Sodium: 142 mmol/L (ref 136–145)
Total Protein: 6.4 g/dL (ref 6.4–8.3)

## 2017-04-06 LAB — IRON AND TIBC
Iron: 50 ug/dL (ref 41–142)
SATURATION RATIOS: 17 % — AB (ref 21–57)
TIBC: 296 ug/dL (ref 236–444)
UIBC: 246 ug/dL

## 2017-04-06 LAB — RETICULOCYTES
RBC.: 4.38 MIL/uL (ref 3.70–5.45)
Retic Count, Absolute: 48.2 10*3/uL (ref 33.7–90.7)
Retic Ct Pct: 1.1 % (ref 0.7–2.1)

## 2017-04-06 LAB — CEA (IN HOUSE-CHCC): CEA (CHCC-In House): 2.45 ng/mL (ref 0.00–5.00)

## 2017-04-06 MED ORDER — TRAMADOL HCL 50 MG PO TABS: 50.0000 mg | ORAL_TABLET | Freq: Four times a day (QID) | ORAL | 0 refills | Status: DC | PRN

## 2017-04-06 NOTE — Telephone Encounter (Signed)
Scheduled appt per 1/28 los - Gave patient AVS and calender per los. Central radiology to contact pt with ct schedule.

## 2017-04-15 ENCOUNTER — Other Ambulatory Visit: Payer: Self-pay | Admitting: *Deleted

## 2017-04-15 DIAGNOSIS — C182 Malignant neoplasm of ascending colon: Secondary | ICD-10-CM

## 2017-04-19 ENCOUNTER — Other Ambulatory Visit: Payer: Self-pay | Admitting: Family Medicine

## 2017-04-22 ENCOUNTER — Ambulatory Visit (INDEPENDENT_AMBULATORY_CARE_PROVIDER_SITE_OTHER): Payer: Medicare Other | Admitting: *Deleted

## 2017-04-22 DIAGNOSIS — E538 Deficiency of other specified B group vitamins: Secondary | ICD-10-CM

## 2017-04-22 NOTE — Progress Notes (Signed)
Pt given Cyanocobalamin inj Tolerated well 

## 2017-04-27 ENCOUNTER — Ambulatory Visit (HOSPITAL_COMMUNITY)
Admission: RE | Admit: 2017-04-27 | Discharge: 2017-04-27 | Disposition: A | Discharge status: Home / Self Care | Payer: Medicare Other | Source: Ambulatory Visit | Attending: Hematology | Admitting: Hematology

## 2017-04-27 DIAGNOSIS — Z9049 Acquired absence of other specified parts of digestive tract: Secondary | ICD-10-CM | POA: Diagnosis not present

## 2017-04-27 DIAGNOSIS — C182 Malignant neoplasm of ascending colon: Secondary | ICD-10-CM | POA: Insufficient documentation

## 2017-04-27 DIAGNOSIS — C189 Malignant neoplasm of colon, unspecified: Secondary | ICD-10-CM | POA: Diagnosis not present

## 2017-04-27 MED ORDER — IOPAMIDOL (ISOVUE-300) INJECTION 61%
INTRAVENOUS | Status: AC
  Filled 2017-04-27: qty 100

## 2017-04-27 MED ORDER — IOPAMIDOL (ISOVUE-300) INJECTION 61%
100.0000 mL | Freq: Once | INTRAVENOUS | Status: AC | PRN
  Administered 2017-04-27: 100 mL via INTRAVENOUS

## 2017-05-03 NOTE — Progress Notes (Signed)
[place]  [phone] Fax:([phone]) [phone]  Clinic Follow up Note   Patient Care Team: Dettinger, [name], MD as PCP - General (Family Medicine) Clent Jacks, MD as Consulting Physician (Ophthalmology) Zadie Rhine Clent Demark, MD as Consulting Physician (Ophthalmology) Donnie Mesa, MD as Consulting Physician (General Surgery) Truitt Merle, MD as Consulting Physician (Hematology)   Date of Service:  05/04/2017    CHIEF COMPLAINTS:  Follow up cancer of the right colon   Oncology History   Cancer Staging Cancer of right colon [facility]) Staging form: Colon and Rectum, AJCC 8th Edition - Pathologic stage from 10/14/2016: Stage IIC (pT4b, pN0, cM0) - Signed by Truitt Merle, MD on 11/07/2016       Cancer of right colon ([place])   09/19/2016 Imaging    CT A/P 09/19/16 IMPRESSION: Large 9.5 cm intraluminal mass in the cecum and ascending colon, consistent with colon carcinoma. Tumor extension into adjacent pericolonic fat seen along the lateral wall.  Mild right pericolonic and mesenteric lymphadenopathy, suspicious for metastatic disease. No other sites of metastatic disease identified.  Colonic diverticulosis, without radiographic evidence of diverticulitis. Tiny hiatal hernia, and small epigastric ventral hernia containing only fat.  Aortic atherosclerosis.      10/14/2016 Initial Diagnosis    Cancer of right colon ([place])      10/14/2016 Surgery    OPEN RIGHT HEMICOLECTOMY and REPAIR OF EPIGASTRIC VENTRAL HERNIA by Dr. Georgette Dover and Dr. Dalbert Batman       10/14/2016 Pathology Results    10/14/16 Diagnosis Colon, segmental resection for tumor, Right ADENOCARCINOMA WITH EXTENSIVE EXTRA CELLULAR MUCIN, GRADE 1, (11.0 CM) THE TUMOR INVADES THROUGH THE CECUM WALL INTO ADJACENT TERMINAL ILEUM (PT4B) NINETEEN BENIGN LYMPH NODES (0/19) SUPPURATIVE INFLAMMATION WITH FIBROSIS AND ADHESION TO ABDOMINAL WALL NO ADENOCARCINOMA IDENTIFIED      11/19/2016 Imaging    CT Chest  WO Contrast 11/19/16 IMPRESSION: 1. No evidence of metastatic disease. 2. Aortic atherosclerosis (ICD10-170.0). Coronary artery calcification.      11/25/2016 - 03/24/2017 Chemotherapy    Adjuvant Xeloda two weeks on, one week off starting 11/25/16 for 3 months        12/21/2016 Genetic Testing    Patient had genetic testing due to a personal history of colon cancer and a family history of breast, uterine, and colon cancer. The Common Hereditary Cancers Panel was ordered.  The Hereditary Gene Panel offered by [facility] includes sequencing and/or deletion duplication testing of the following 47 genes: APC, ATM, AXIN2, BARD1, BMPR1A, BRCA1, BRCA2, BRIP1, CDH1, CDKN2A (p14ARF), CDKN2A (p16INK4a), CHEK2, CDK4, CTNNA1, DICER1, EPCAM (Deletion/duplication testing only), GREM1 (promoter region deletion/duplication testing only), KIT, MEN1, MLH1, MSH2, MSH3, MSH6, MUTYH, NBN, NF1, NHTL1, PALB2, PDGFRA, PMS2, POLD1, POLE, PTEN, RAD50, RAD51C, RAD51D, SDHB, SDHC, SDHD, SMAD4, SMARCA4. STK11, TP53, TSC1, TSC2, and VHL.  The following genes were evaluated for sequence changes only: SDHA and HOXB13 c.251G>A variant only.  Results: Negative- no pathogenic variants identified.  The date of this test report is 12/21/2016.       04/27/2017 Imaging    CT AP W Contrast 04/27/17 IMPRESSION: Status post right hemicolectomy. No evidence of recurrent or metastatic disease. Additional stable ancillary findings as above.        HISTORY OF PRESENTING ILLNESS:  Ashley Peters 84 y.o. female is here because of cancer of the right colon. She presents with her daughter and husband. Initially, pt presented to her PCP's office on 09/19/16 for a 64mocheck-up and with complaints of worsening right lower quadrant  pain that had been ongoing for several weeks. This was accompanied by fatigue, chills, and weight loss of approximately 30lbs over the past year. However, she reports that she was enrolled in a nutrition program for  diabetics to help her lose weight (she is only borderline diabetic). Her daughter was able to notice her fatigue as well. She had not noticed any bloody stools, however, she did notice some melena but was on iron supplements for her anemia. She denies any significant constipation with this. She had also noticed a small area of distension over her area of pain in the right lower abdomen which she had not noticed previously. CT A/P was performed the same day which was remarkable for a large 9.5cm intraluminal mass in the cecum and ascending colon, consistent with colon carcinoma. There was tumor extension into the adjacent pericolonic fat seen along the lateral abdominal wall as well. Additionally, mild right pericolonic and mesenteric lymphadenopathy was also seen which was suspicious for metastatic disease. An epigastric ventral hernia was also found to be present on this scan.   Following these findings, she was subsequently referred to Dr Georgette Dover who performed both an open right hemicolectomy and epigastric ventral hernia repair on 10/14/16. She tolerated this procedure well and was monitored in the hospital until her discharge on 10/17/16, during which she recovered well. She was referred to medical oncology following her surgery and she has been recovering well since d/c. Since her surgery she denies any pain returning to the area. Her bowel movements have been normal and regular since her surgery and her appetite has been well. Her fatigue has also been improving and back to normal.   She currently lives alone in [place], [place] and very independent and active.    CURRENT THERAPY:  surveillance   INTERVAL HISTORY:   Ashley Peters is here for a follow up. She presents to the clinic today accompanied by her daughter. She reports her left leg pain has improved greatly and she has gotten a cortisone injection. She notes she has recovered well from chemo pill and is able to complete tasks with normal energy.    On review of systems, pt denies numbness, hand dryness or any other complaints at this time. Pertinent positives are listed and detailed within the above HPI.    MEDICAL HISTORY:  Past Medical History:  Diagnosis Date  . Anemia   . Cancer ([place])   . Colon cancer ([place])   . Diverticulosis   . Family history of breast cancer   . Family history of colon cancer   . Family history of uterine cancer   . GERD (gastroesophageal reflux disease)   . History of kidney stones   . Hyperlipidemia   . Hypertension   . Macular degeneration   . Osteopenia     SURGICAL HISTORY: Past Surgical History:  Procedure Laterality Date  . ABDOMINAL HYSTERECTOMY    . ablation tr Great saphenous vein  10/17/2009  . BREAST EXCISIONAL BIOPSY Bilateral 1971  . brest biopsy  1971  . CHOLECYSTECTOMY    . EPIGASTRIC HERNIA REPAIR N/A 10/14/2016   Procedure: REPAIR OF EPIGASTRIC VENTRAL HERNIA;  Surgeon: Donnie Mesa, MD;  Location: [facility];  Service: General;  Laterality: N/A;  . lt. distal ureteral stone extraction  09/14/1991  . PARTIAL COLECTOMY Right 10/14/2016   Procedure: OPEN RIGHT HEMICOLECTOMY;  Surgeon: Donnie Mesa, MD;  Location: [facility];  Service: General;  Laterality: Right;    SOCIAL HISTORY: Social History  Socioeconomic History  . Marital status: Widowed    Spouse name: Not on file  . Number of children: Not on file  . Years of education: Not on file  . Highest education level: Not on file  Social Needs  . Financial resource strain: Not on file  . Food insecurity - worry: Not on file  . Food insecurity - inability: Not on file  . Transportation needs - medical: Not on file  . Transportation needs - non-medical: Not on file  Occupational History  . Not on file  Tobacco Use  . Smoking status: Never Smoker  . Smokeless tobacco: Never Used  Substance and Sexual Activity  . Alcohol use: No  . Drug use: No  . Sexual activity: Yes  Other Topics Concern  . Not on file  Social  History Narrative  . Not on file    FAMILY HISTORY: Family History  Problem Relation Age of Onset  . Stroke Father   . Breast cancer Sister        dx 84's, died at 56  . Diabetes Brother   . Kidney disease Brother   . Heart disease Brother   . Epilepsy Son   . Colon cancer Other 70  . Uterine cancer Other 51  . Hodgkin's lymphoma Other     ALLERGIES:  is allergic to benicar [olmesartan medoxomil] and amoxicillin.  MEDICATIONS:  Current Outpatient Medications  Medication Sig Dispense Refill  . acetaminophen (TYLENOL) 500 MG tablet Take 2 tablets (1,000 mg total) by mouth every 6 (six) hours as needed for moderate pain. 30 tablet 0  . cholecalciferol (VITAMIN D) 1000 units tablet Take 1,000 Units by mouth daily.    . diphenoxylate-atropine (LOMOTIL) 2.5-0.025 MG tablet Take 1 tablet 4 (four) times daily as needed by mouth for diarrhea or loose stools. 60 tablet 0  . ferrous sulfate 325 (65 FE) MG tablet TAKE 1 TABLET (325 MG TOTAL) BY MOUTH 2 (TWO) TIMES DAILY WITH A MEAL. 60 tablet 5  . hydrocortisone cream 1 % Apply 1 application topically as needed for itching.    . Lidocaine-Glycerin (PREPARATION H) 5-14.4 % CREA Place 1 application rectally as needed (itching).    . Menthol-Camphor (ICY HOT ARTHRITIS PAIN RELIEF EX) Apply 1 application topically as needed (joint pain).    . methocarbamol (ROBAXIN) 500 MG tablet Take 1 tablet (500 mg total) by mouth 2 (two) times daily. 20 tablet 0  . Multiple Vitamins-Minerals (PRESERVISION AREDS 2 PO) Take 1 capsule by mouth 2 (two) times daily.    . traMADol (ULTRAM) 50 MG tablet Take 1 tablet (50 mg total) by mouth every 6 (six) hours as needed. 15 tablet 0   Current Facility-Administered Medications  Medication Dose Route Frequency Provider Last Rate Last Dose  . cyanocobalamin ((VITAMIN B-12)) injection 1,000 mcg  1,000 mcg Intramuscular Q30 days Dettinger, [name], MD   1,000 mcg at 04/22/17 1145    REVIEW OF SYSTEMS:    Constitutional: Denies fevers, chills or abnormal night sweats  Eyes: Denies blurriness of vision, double vision or watery eyes Ears, nose, mouth, throat, and face: Denies mucositis or sore throat Respiratory: Denies cough, dyspnea or wheezes Cardiovascular: Denies palpitation, chest discomfort Gastrointestinal:  Denies heartburn  (+) hemorrhoids  Skin: Denies abnormal skin rashes  Lymphatics: Denies new lymphadenopathy or easy bruising Neurological:Denies numbness, tingling or new weaknesses Behavioral/Psych: Mood is stable, no new changes  MSK: (+) myalgias in her left leg, improved All other systems were reviewed with the  patient and are negative.  PHYSICAL EXAMINATION:  ECOG PERFORMANCE STATUS: 0  Vitals:   05/04/17 0839  BP: (!) 171/77  Pulse: 83  Resp: 17  Temp: 97.8 F (36.6 C)  SpO2: 100%   Filed Weights   05/04/17 0839  Weight: 123 lb 11.2 oz (56.1 kg)     GENERAL:alert, no distress and comfortable SKIN: skin color, texture, turgor are normal, no rashes or significant lesions EYES: normal, conjunctiva are pink and non-injected, sclera clear OROPHARYNX:no exudate, no erythema and lips, buccal mucosa, and tongue normal  NECK: supple, thyroid normal size, non-tender, without nodularity LYMPH:  no palpable lymphadenopathy in the cervical, axillary or inguinal LUNGS: clear to auscultation and percussion with normal breathing effort HEART: regular rhythm and no murmurs (+) mild pitting edema on both lower extremity up to mid calf  ABDOMEN:abdomen soft, non-tender and normal bowel sounds. Midline surgical incision has healed well. Musculoskeletal:no cyanosis of digits and no clubbing  PSYCH: alert & oriented x 3 with fluent speech NEURO: no focal motor/sensory deficits  LABORATORY DATA:  I have reviewed the data as listed CBC Latest Ref Rng & Units 04/06/2017 04/05/2017 03/23/2017  WBC 3.9 - 10.3 K/uL 7.3 7.0 6.3  Hemoglobin 12.0 - 15.0 g/dL - 12.9 13.3  Hematocrit  34.8 - 46.6 % 42.3 39.7 39.9  Platelets 145 - 400 K/uL 199 189 262   CMP Latest Ref Rng & Units 04/06/2017 04/05/2017 03/23/2017  Glucose 70 - 140 mg/dL 90 91 96  BUN 7 - 26 mg/dL _0 Creatinine 0.60 - 1.10 mg/dL 0.69 0.56 0.61  Sodium 136 - 145 mmol/L 142 141 144  Potassium 3.3 - 4.7 mmol/L 4.0 3.9 5.2  Chloride 98 - 109 mmol/L 108 109 108(H)  CO2 22 - 29 mmol/L _1 Calcium 8.4 - 10.4 mg/dL 9.3 9.0 9.5  Total Protein 6.4 - 8.3 g/dL 6.4 5.3(L) 6.6  Total Bilirubin 0.2 - 1.2 mg/dL 0.6 1.0 0.6  Alkaline Phos 40 - 150 U/L 82 61 71  AST 5 - 34 U/L _2 ALT 0 - 55 U/L _3 PATHOLOGY REPORTS:   10/14/16 Diagnosis Colon, segmental resection for tumor, Right ADENOCARCINOMA WITH EXTENSIVE EXTRA CELLULAR MUCIN, GRADE 1, (11.0 CM) THE TUMOR INVADES THROUGH THE CECUM WALL INTO ADJACENT TERMINAL ILEUM (PT4B) NINETEEN BENIGN LYMPH NODES (0/19) SUPPURATIVE INFLAMMATION WITH FIBROSIS AND ADHESION TO ABDOMINAL WALL NO ADENOCARCINOMA IDENTIFIED Microscopic Comment COLON AND RECTUM (INCLUDING TRANS-ANAL RESECTION): Specimen: Right colon with terminal ileum Procedure: Segmental resection Tumor site: Cecum and terminal ileum Specimen integrity: Intact Macroscopic intactness of mesorectum: Not applicable: X Complete: NA Near complete: NA Incomplete: NA Cannot be determined (specify): NA Macroscopic tumor perforation: Invades adjacent structure Invasive tumor: Maximum size: 11.0 cm Histologic type(s): Adenocarcinoma with extracellular mucin Histologic grade and differentiation: G1: well differentiated/low grade G2: moderately differentiated/low grade G3: poorly differentiated/high grade G4: undifferentiated/high grade Type of polyp in which invasive carcinoma arose: Tubular adenoma Microscopic extension of invasive tumor: Invades adjacent terminal ileum 1 of 5 Supplemental copy SUPPLEMENTAL for TRANISE, FORREST ([id number]) Microscopic  Comment(continued) Lymph-Vascular invasion: Negative Peri-neural invasion: Negative Tumor deposit(s) (discontinuous extramural extension): Negative Resection margins: Proximal margin: Negative Distal margin: Negative Circumferential (radial) (posterior ascending, posterior descending; lateral and posterior mid-rectum; and entire lower 1/3 rectum):NA Mesenteric margin (sigmoid and transverse): NA Distance closest margin (if all above margins negative): 1.5 cm from abdominal wall adhesion resection Trans-anal resection margins only: Deep margin: NA Mucosal Margin:  NA Distance closest mucosal margin (if negative): NA Treatment effect (neo-adjuvant therapy): Negative Additional polyp(s): Negative Non-neoplastic findings: Unremarkable Lymph nodes: number examined 19; number positive: 0 Pathologic Staging: pT4b, pN0, pMx Ancillary studies: Ordered  RADIOGRAPHIC STUDIES: I have personally reviewed the radiological images as listed and agreed with the findings in the report.  Dg Pelvis 1-2 Views  Result Date: 04/05/2017 CLINICAL DATA:  Left thigh pain, no known injury, initial encounter EXAM: PELVIS - 1-2 VIEW COMPARISON:  None. FINDINGS: Pelvic ring is intact. Mild degenerative changes of the hip joints are noted. No acute fracture is noted. No soft tissue abnormality is seen. IMPRESSION: No acute abnormality noted. Electronically Signed   By: Inez Catalina M.D.   On: 04/05/2017 11:27   Ct Abdomen Pelvis W Contrast  Result Date: 04/27/2017 CLINICAL DATA:  Colon cancer diagnosed 2018, status post chemotherapy EXAM: CT ABDOMEN AND PELVIS WITH CONTRAST TECHNIQUE: Multidetector CT imaging of the abdomen and pelvis was performed using the standard protocol following bolus administration of intravenous contrast. CONTRAST:  159m ISOVUE-300 IOPAMIDOL (ISOVUE-300) INJECTION 61% COMPARISON:  09/19/2016 FINDINGS: Lower chest: Lung bases are clear. Hepatobiliary: Liver is within normal limits. Status  post cholecystectomy. Mild intrahepatic ductal dilatation. Common duct measures 9 mm (coronal image 33) and smoothly tapers at the ampulla, likely postsurgical. Pancreas: Within normal limits. Spleen: Within normal limits. Adrenals/Urinary Tract: Adrenal glands are within normal limits. Bilateral renal cysts, measuring up to 5.4 cm along the left lower kidney (series 2/image 44). No hydronephrosis. Bladder is within normal limits. Stomach/Bowel: Stomach is notable for a small hiatal hernia. Small duodenal diverticuli. No evidence of bowel obstruction. Status post right hemicolectomy with appendectomy. Extensive sigmoid diverticulosis, without evidence of diverticulitis. Vascular/Lymphatic: No evidence of abdominal aortic aneurysm. Atherosclerotic calcifications of the abdominal aorta and branch vessels. No suspicious abdominopelvic lymphadenopathy. Reproductive: Status post hysterectomy. Bilateral ovaries are within normal limits. Other: No abdominopelvic ascites. Musculoskeletal: Mild degenerative changes of the visualized thoracolumbar spine. IMPRESSION: Status post right hemicolectomy. No evidence of recurrent or metastatic disease. Additional stable ancillary findings as above. Electronically Signed   By: SJulian HyM.D.   On: 04/27/2017 09:30   Dg Femur Min 2 Views Left  Result Date: 04/05/2017 CLINICAL DATA:  Left thigh pain, no known injury, initial encounter EXAM: LEFT FEMUR 2 VIEWS COMPARISON:  None. FINDINGS: There is no evidence of fracture or other focal bone lesions. Soft tissues are unremarkable. Mild degenerative changes of the left hip joint are noted. IMPRESSION: No acute abnormality noted. Electronically Signed   By: MInez CatalinaM.D.   On: 04/05/2017 11:25    CT AP W Contrast 04/27/17 IMPRESSION: Status post right hemicolectomy. No evidence of recurrent or metastatic disease. Additional stable ancillary findings as above.   CT Chest WO Contrast 11/19/16 IMPRESSION: 1. No evidence  of metastatic disease. 2. Aortic atherosclerosis (ICD10-170.0). Coronary artery calcification.  CT A/P 09/19/16 IMPRESSION: Large 9.5 cm intraluminal mass in the cecum and ascending colon, consistent with colon carcinoma. Tumor extension into adjacent pericolonic fat seen along the lateral wall. Mild right pericolonic and mesenteric lymphadenopathy, suspicious for metastatic disease. No other sites of metastatic disease identified. Colonic diverticulosis, without radiographic evidence of diverticulitis. Tiny hiatal hernia, and small epigastric ventral hernia containing only fat. Aortic atherosclerosis.  ASSESSMENT: 84y.o. Caucasian female  1.) Cancer of the right colon into the cecum, invasive adenocarcinoma, G1, pT4bN0Mx stage IIA,   MSI-stable  -I previously discuss with her the surgical pathology results. She was found to have  right-sided colon cancer beginning in the cecum. She did have an impressively large at 11cm tumor which invaded around the adjacent small intestine which was removed without complication by Dr Georgette Dover. Her surgical margins were negative. All 19 lymph nodes were negative. -I reviewed her staging CT abd/pel scan and chest, which was negative for metastatic disease.  -She had stage II disease, early-stage but with high risk features, because of her T4b tumor  - I discussed her high recurrence risk due to T4b lesion, especially local recurrence  -She agreed to try adjuvant chemotherapy with Xeloda, had a severe diarrhea after second cycle, eventually recovered. She re-started Xeloda at suggested low dose 1073m BID (1 week on and 1 weeks off) on 02/10/17, tolerating well overall  -She has completed adjuvant Xeloda (a total of 3 months) -Ct AP W Contrast from 04/27/17 reveals no evidence of recurrent or metastatic disease. I discussed results with pt and her family. They are pleased. Labs reveiwed, they are WNL. No clinical concern for recurrence.  -She has developed  some left hip and thigh pain since she completed chemo, improved with hip joint injection, able to function well at home, continue observation. -I discussed that she is now on surveillance, we will see her every 3-4 months, scan every 6-12 months or so for the 1st 3 years then f/u every 6 months for a total of 5 years. I advised her to let u[place]ore[place]now if she develops symptoms of pain, weight loss, decreased appetite.  - I will order a CT CAP at next visit to be done at the end of the year.  -F/u in 4 months    2.) Genetics -Due to her prevalent family history of cancer, I previously suggested that she should follow up with genetics for testing. She would like to have this performed.  -She does have two sons and one daughter, I also informed her that if her genetic testing were to come back with abnormal results that her children should be tested as well.  -Also advised that her children should have routine colonoscopies.  -She completed genetic testing 12/03/16 and her results are negative  3.) Anemia  -We will monitor her Hgb and Iron levels. If these continue to worsen or do not improve, we will consider infusion to aid with this.  -I previously encouraged her to continue taking her iron supplements at home.   -We will monitor this with her lab work.  -As of 12/03/16 her Hg is 13.5, and Ferritin is normal at 61.  -Hg resolved at 13.5 as of 12/15/16 -She will continue iron supplement for one more month and then she can stop.  -She is currently off oral iron   4.) Borderline DM and HTN: -She was on BP medication prior to her weight loss, she was taken off of this following her weight loss.   -Continue with weight loss plan for management and I encouraged her to maintain a good diet to manage her sugars.  -previously advised the patient to monitor her blood pressure at home. Patient blood pressure was 171/77 on 05/04/17 -She will continue f/u w/ her PCP for this.   5) Left thigh pain -She has  developed left side pain in the past 3-4 weeks, more noticeable when she lays down, improves when she walks.  No leg swelling or other new complaints.  X-ray and Doppler were negative. -She will continue her anti-inflammatory medication prescribed by her PCP and the ED physician. -I prescribed 15 pills of  tramadol on 04/06/17 for her as needed for severe pain. -improved   PLAN:  Lab and scan reviewed, no evidence of recurrence  f/u in 4 months w Lab Order CT CAP at next visit        No orders of the defined types were placed in this encounter.   All questions were answered. The patient knows to call the clinic with any problems, questions or concerns.I spent 15 minutes counseling the patient face to face. The total time spent in the appointment was 20 minutes and more than 50% was on counseling.   This document serves as a record of services personally performed by Truitt Merle, MD. It was created on her behalf by Theresia Bough, a trained medical scribe. The creation of this record is based on the scribe's personal observations and the provider's statements to them.   I have reviewed the above documentation for accuracy and completeness, and I agree with the above.    Truitt Merle, MD 05/04/2017 5:48 PM

## 2017-05-04 ENCOUNTER — Inpatient Hospital Stay: Payer: Medicare Other | Attending: Hematology | Admitting: Hematology

## 2017-05-04 ENCOUNTER — Telehealth: Payer: Self-pay | Admitting: Hematology

## 2017-05-04 ENCOUNTER — Encounter: Payer: Self-pay | Admitting: Hematology

## 2017-05-04 VITALS — BP 171/77 | HR 83 | Temp 97.8°F | Resp 17 | Ht <= 58 in | Wt 123.7 lb

## 2017-05-04 DIAGNOSIS — I10 Essential (primary) hypertension: Secondary | ICD-10-CM | POA: Diagnosis not present

## 2017-05-04 DIAGNOSIS — M79652 Pain in left thigh: Secondary | ICD-10-CM

## 2017-05-04 DIAGNOSIS — C188 Malignant neoplasm of overlapping sites of colon: Secondary | ICD-10-CM

## 2017-05-04 DIAGNOSIS — I2510 Atherosclerotic heart disease of native coronary artery without angina pectoris: Secondary | ICD-10-CM | POA: Diagnosis not present

## 2017-05-04 DIAGNOSIS — C182 Malignant neoplasm of ascending colon: Secondary | ICD-10-CM

## 2017-05-04 DIAGNOSIS — D500 Iron deficiency anemia secondary to blood loss (chronic): Secondary | ICD-10-CM

## 2017-05-04 DIAGNOSIS — D649 Anemia, unspecified: Secondary | ICD-10-CM | POA: Diagnosis not present

## 2017-05-04 NOTE — Telephone Encounter (Signed)
Scheduled appt per 2/25 los - Gave patient AVS and calender per los,.

## 2017-05-21 ENCOUNTER — Ambulatory Visit: Payer: Medicare Other

## 2017-06-02 ENCOUNTER — Ambulatory Visit (INDEPENDENT_AMBULATORY_CARE_PROVIDER_SITE_OTHER): Payer: Medicare Other | Admitting: *Deleted

## 2017-06-02 DIAGNOSIS — E538 Deficiency of other specified B group vitamins: Secondary | ICD-10-CM | POA: Diagnosis not present

## 2017-06-02 NOTE — Progress Notes (Signed)
Pt given Cyanocobalamin inj Tolerated well 

## 2017-07-06 ENCOUNTER — Ambulatory Visit (INDEPENDENT_AMBULATORY_CARE_PROVIDER_SITE_OTHER): Payer: Medicare Other | Admitting: *Deleted

## 2017-07-06 DIAGNOSIS — E538 Deficiency of other specified B group vitamins: Secondary | ICD-10-CM | POA: Diagnosis not present

## 2017-07-06 NOTE — Progress Notes (Signed)
Pt given Cyanocobalamin inj Tolerated well 

## 2017-08-05 ENCOUNTER — Ambulatory Visit (INDEPENDENT_AMBULATORY_CARE_PROVIDER_SITE_OTHER): Payer: Medicare Other | Admitting: *Deleted

## 2017-08-05 DIAGNOSIS — E538 Deficiency of other specified B group vitamins: Secondary | ICD-10-CM | POA: Diagnosis not present

## 2017-08-05 NOTE — Progress Notes (Signed)
Pt given Cyanocobalamin inj Tolerated well 

## 2017-08-06 ENCOUNTER — Encounter: Payer: Medicare Other | Admitting: *Deleted

## 2017-08-06 ENCOUNTER — Encounter: Payer: Self-pay | Admitting: *Deleted

## 2017-08-06 ENCOUNTER — Ambulatory Visit (INDEPENDENT_AMBULATORY_CARE_PROVIDER_SITE_OTHER): Payer: Medicare Other | Admitting: *Deleted

## 2017-08-06 ENCOUNTER — Ambulatory Visit (INDEPENDENT_AMBULATORY_CARE_PROVIDER_SITE_OTHER): Payer: Medicare Other

## 2017-08-06 VITALS — BP 147/79 | HR 76 | Ht 58.25 in | Wt 131.0 lb

## 2017-08-06 DIAGNOSIS — Z780 Asymptomatic menopausal state: Secondary | ICD-10-CM | POA: Diagnosis not present

## 2017-08-06 DIAGNOSIS — M8588 Other specified disorders of bone density and structure, other site: Secondary | ICD-10-CM | POA: Diagnosis not present

## 2017-08-06 DIAGNOSIS — Z0000 Encounter for general adult medical examination without abnormal findings: Secondary | ICD-10-CM | POA: Diagnosis not present

## 2017-08-06 DIAGNOSIS — M8580 Other specified disorders of bone density and structure, unspecified site: Secondary | ICD-10-CM

## 2017-08-06 NOTE — Patient Instructions (Addendum)
  Ashley Peters , Thank you for taking time to come for your Medicare Wellness Visit. I appreciate your ongoing commitment to your health goals. Please review the following plan we discussed and let me know if I can assist you in the future.   These are the goals we discussed: Goals    . Exercise 3x per week (30 min per time)       This is a list of the screening recommended for you and due dates:  Health Maintenance  Topic Date Due  . DEXA scan (bone density measurement)  07/22/2017  . Tetanus Vaccine  03/23/2018*  . Flu Shot  10/08/2017  . Pneumonia vaccines  Completed  *Topic was postponed. The date shown is not the original due date.   Check with [employer] about the cost of a tetanus shot called Boostrix.

## 2017-08-06 NOTE — Progress Notes (Signed)
Subjective:   Ashley Peters is a 84 y.o. female who presents for a Medicare Annual Wellness Visit. Ashley Peters lives at home alone. Her husband passed away 19 years ago from a heart attack. She has three adult children, 2 grandchildren, and 2 great grandchildren. Her 3 year old son was recently diagnosed with Hodgkin's Lymphoma. Her daughter lives close by and visits or calls daily. She enjoys quilting and usually many them for Christmas presents.    Review of Systems    Patient reports that her health is better compared to last year. She was diagnosed with colon cancer last year and has been through treatments. She is feeling better at this time and has gained her weight back. No problems with her appetite at this time.   Cardiac Risk Factors include: advanced age (>3men, >40 women);sedentary lifestyle     Current Medications (verified) Outpatient Encounter Medications as of 08/06/2017  Medication Sig  . acetaminophen (TYLENOL) 500 MG tablet Take 2 tablets (1,000 mg total) by mouth every 6 (six) hours as needed for moderate pain.  . cholecalciferol (VITAMIN D) 1000 units tablet Take 1,000 Units by mouth daily.  . diphenoxylate-atropine (LOMOTIL) 2.5-0.025 MG tablet Take 1 tablet 4 (four) times daily as needed by mouth for diarrhea or loose stools.  . ferrous sulfate 325 (65 FE) MG tablet TAKE 1 TABLET (325 MG TOTAL) BY MOUTH 2 (TWO) TIMES DAILY WITH A MEAL.  . hydrocortisone cream 1 % Apply 1 application topically as needed for itching.  . Lidocaine-Glycerin (PREPARATION H) 5-14.4 % CREA Place 1 application rectally as needed (itching).  . Menthol-Camphor (ICY HOT ARTHRITIS PAIN RELIEF EX) Apply 1 application topically as needed (joint pain).  . methocarbamol (ROBAXIN) 500 MG tablet Take 1 tablet (500 mg total) by mouth 2 (two) times daily.  . Multiple Vitamins-Minerals (PRESERVISION AREDS 2 PO) Take 1 capsule by mouth 2 (two) times daily.  . traMADol (ULTRAM) 50 MG tablet Take 1  tablet (50 mg total) by mouth every 6 (six) hours as needed.   Facility-Administered Encounter Medications as of 08/06/2017  Medication  . cyanocobalamin ((VITAMIN B-12)) injection 1,000 mcg    Allergies (verified) Benicar [olmesartan medoxomil] and Amoxicillin   History: Past Medical History:  Diagnosis Date  . Anemia   . Cancer ([place])   . Colon cancer ([place])   . Diverticulosis   . Family history of breast cancer   . Family history of colon cancer   . Family history of uterine cancer   . GERD (gastroesophageal reflux disease)   . History of kidney stones   . Hyperlipidemia   . Hypertension   . Macular degeneration   . Osteopenia    Past Surgical History:  Procedure Laterality Date  . ABDOMINAL HYSTERECTOMY    . ablation tr Great saphenous vein  10/17/2009  . BREAST EXCISIONAL BIOPSY Bilateral 1971  . brest biopsy  1971  . CHOLECYSTECTOMY    . EPIGASTRIC HERNIA REPAIR N/A 10/14/2016   Procedure: REPAIR OF EPIGASTRIC VENTRAL HERNIA;  Surgeon: Donnie Mesa, MD;  Location: [facility];  Service: General;  Laterality: N/A;  . lt. distal ureteral stone extraction  09/14/1991  . PARTIAL COLECTOMY Right 10/14/2016   Procedure: OPEN RIGHT HEMICOLECTOMY;  Surgeon: Donnie Mesa, MD;  Location: [facility];  Service: General;  Laterality: Right;   Family History  Problem Relation Age of Onset  . Stroke Father 60  . Breast cancer Sister        dx 57's,  died at 60  . Seizures Son   . Diabetes Brother   . Kidney disease Brother   . Heart disease Brother   . Epilepsy Son   . Colon cancer Other 4  . Uterine cancer Other 67  . Hodgkin's lymphoma Other    Social History   Socioeconomic History  . Marital status: Widowed    Spouse name: Not on file  . Number of children: 3  . Years of education: 57  . Highest education level: Some college, no degree  Occupational History  . Occupation: Retired    Comment: Network engineer  Social Needs  . Financial resource strain: Not hard at all  . Food  insecurity:    Worry: Never true    Inability: Never true  . Transportation needs:    Medical: No    Non-medical: No  Tobacco Use  . Smoking status: Never Smoker  . Smokeless tobacco: Never Used  Substance and Sexual Activity  . Alcohol use: No  . Drug use: No  . Sexual activity: Not Currently  Lifestyle  . Physical activity:    Days per week: 3 days    Minutes per session: 60 min  . Stress: Only a little  Relationships  . Social connections:    Talks on phone: More than three times a week    Gets together: More than three times a week    Attends religious service: More than 4 times per year    Active member of club or organization: Yes    Attends meetings of clubs or organizations: More than 4 times per year    Relationship status: Widowed  Other Topics Concern  . Not on file  Social History Narrative  . Not on file    Tobacco Use No.  Clinical Intake:     Pain : No/denies pain     Nutritional Status: BMI 25 -29 Overweight Diabetes: No  How often do you need to have someone help you when you read instructions, pamphlets, or other written materials from your doctor or pharmacy?: 1 - Never What is the last grade level you completed in school?: finished high school and one year of business school  Interpreter Needed?: No  Information entered by :: Chong Sicilian, RN   Activities of Daily Living In your present state of health, do you have any difficulty performing the following activities: 08/06/2017 10/15/2016  Hearing? N N  Vision? N N  Comment macular dengeration. Sees eye doctor regularly.  -  Difficulty concentrating or making decisions? N N  Walking or climbing stairs? N N  Dressing or bathing? N N  Doing errands, shopping? N N  Preparing Food and eating ? N -  Using the Toilet? N -  In the past six months, have you accidently leaked urine? Y -  Comment some stress incontinence -  Do you have problems with loss of bowel control? N -  Managing your  Medications? N -  Managing your Finances? N -  Housekeeping or managing your Housekeeping? N -  Some recent data might be hidden     Immunizations and Health Maintenance Immunization History  Administered Date(s) Administered  . DTaP 11/09/2003  . Influenza Whole 12/10/2009  . Influenza, High Dose Seasonal PF 12/15/2016  . Influenza,inj,Quad PF,6+ Mos 12/15/2012, 12/22/2013, 12/13/2014, 12/25/2015  . Pneumococcal Conjugate-13 06/23/2016  . Pneumococcal Polysaccharide-23 03/16/2007   Health Maintenance Due  Topic Date Due  . DEXA SCAN  07/22/2017    Patient Care Team: Dettinger,  [name], MD as PCP - General (Family Medicine) Clent Jacks, MD as Consulting Physician (Ophthalmology) Zadie Rhine Clent Demark, MD as Consulting Physician (Ophthalmology) Donnie Mesa, MD as Consulting Physician (General Surgery) Truitt Merle, MD as Consulting Physician (Hematology)   No hospitalizations, ER visits, or surgeries this past year.  Objective:    Today's Vitals   08/06/17 0856  BP: (!) 147/79  Pulse: 76  Weight: 131 lb (59.4 kg)  Height: 4' 10.25" (1.48 m)   Body mass index is 27.14 kg/m.  Advanced Directives 08/06/2017 04/05/2017 02/23/2017 01/19/2017 01/05/2017 12/15/2016 12/03/2016  Does Patient Have a Medical Advance Directive? Yes Yes Yes Yes Yes No No  Type of Paramedic of [place];Living will [place];Living will [place];Living will Healthcare Power of [place] - -  Does patient want to make changes to medical advance directive? No - Patient declined - - - - - -  Copy of [place] in Chart? No - copy requested - No - copy requested Yes No - copy requested - -  Would patient like information on creating a medical advance directive? - - - - - Yes (ED - Information included in AVS);No - Patient declined -        Assessment:   This is a routine wellness examination for  Tameya.  Hearing/Vision  normal or No deficits noted during visit. normal or No deficits noted during visit.    Diet Appetite is good. Eats 2 to 3 meals a day.   Exercise Current Exercise Habits: Home exercise routine, Type of exercise: walking, Time (Minutes): 60, Frequency (Times/Week): 3, Weekly Exercise (Minutes/Week): 180, Intensity: Mild, Exercise limited by: None identified   Depression Screen PHQ 2/9 Scores 08/06/2017 03/28/2017 03/23/2017 09/19/2016 06/23/2016 06/19/2016 06/04/2016  PHQ - 2 Score 0 0 0 0 0 0 0     Fall Risk Fall Risk  08/06/2017 03/28/2017 03/23/2017 09/19/2016 06/23/2016  Falls in the past year? No No No No No    Is the patient's home free of loose throw rugs in walkways, pet beds, electrical cords, etc?   yes      Grab bars in the bathroom? no      Walkin shower? no      Shower Seat? no      Handrails on the stairs?   yes      Adequate lighting?   yes   Cognitive Function: MMSE - Mini Mental State Exam 08/06/2017 06/23/2016 12/04/2014  Orientation to time 5 5 5   Orientation to Place 5 5 5   Registration 3 3 3   Attention/ Calculation 5 5 5   Recall 2 2 2   Language- name 2 objects 2 2 2   Language- repeat 1 1 1   Language- follow 3 step command 3 3 3   Language- read & follow direction 1 1 1   Write a sentence 1 1 1   Copy design 1 1 1   Total score 29 29 29        Normal Cognitive Function Screening: Yes  Screening Tests Health Maintenance  Topic Date Due  . DEXA SCAN  07/22/2017  . TETANUS/TDAP  03/23/2018 (Originally 11/08/2013)  . INFLUENZA VACCINE  10/08/2017  . PNA vac Low Risk Adult  Completed    Cancer Screenings: Lung: Low Dose CT Chest recommended if Age 27-80 years, 30 pack-year currently smoking OR have quit w/in 15years. Patient does not qualify. Breast: Up to date on Mammogram? Not indicated Up to date of  Bone Density/Dexa? No Colorectal: Up to date? Not indicated Cervical Cancer: Up to date? Not indicated  Additional  Screenings Hepatitis C Screening: not indicated    Plan:    Goals    . Exercise 3x per week (30 min per time)       Keep f/u with Dettinger, [name], MD and any other specialty appointments you may have Continue current medications Move carefully to avoid falls. Use assistive devices like a can or walker if needed. Aim for at least 150 minutes of moderate activity a week. This can be done with chair exercises if necessary. Read or work on puzzles daily Stay connected with friends and family  Review and return a signed, witnessed, and notarized copy of Advance Directives if given.  The following health maintenance items are recommended based on your health history and current guidelines: Colon cancer screening: No Mammogram: no Tdap Vaccine: yes, check with [employer] for cost Zostavax (Shingles vaccine):no Prevnar or Pneumovax (pneumonia vaccines): no Dexa Scan: yes, done today  Orders Placed This Encounter  Procedures  . DG [facility] DEXA    Order Specific Question:   Reason for Exam (SYMPTOM  OR DIAGNOSIS REQUIRED)    Answer:   osteopenia and post menopausal    I have personally reviewed and noted the following in the patient's chart:   . Medical and social history . Use of alcohol, tobacco or illicit drugs  . Current medications and supplements . Functional ability and status . Nutritional status . Physical activity . Advanced directives . List of other physicians . Hospitalizations, surgeries, and ER visits in previous 12 months . Vitals . Screenings to include cognitive, depression, and falls . Referrals and appointments  In addition, I have reviewed and discussed with patient certain preventive protocols, quality metrics, and best practice recommendations. A written personalized care plan for preventive services as well as general preventive health recommendations were provided to patient.     Chong Sicilian, RN   08/06/2017

## 2017-08-07 DIAGNOSIS — Z780 Asymptomatic menopausal state: Secondary | ICD-10-CM | POA: Diagnosis not present

## 2017-08-07 DIAGNOSIS — M810 Age-related osteoporosis without current pathological fracture: Secondary | ICD-10-CM | POA: Diagnosis not present

## 2017-08-07 DIAGNOSIS — M85852 Other specified disorders of bone density and structure, left thigh: Secondary | ICD-10-CM | POA: Diagnosis not present

## 2017-08-19 ENCOUNTER — Encounter: Payer: Self-pay | Admitting: *Deleted

## 2017-08-26 NOTE — Progress Notes (Signed)
[facility]  [phone] [phone]  Clinic Follow up Note   Patient Care Team: Dettinger, [name], MD as PCP - General (Family Medicine) Clent Jacks, MD as Consulting Physician (Ophthalmology) Zadie Rhine Clent Demark, MD as Consulting Physician (Ophthalmology) Donnie Mesa, MD as Consulting Physician (General Surgery) Truitt Merle, MD as Consulting Physician (Hematology)   Date of Service:  08/27/2017    CHIEF COMPLAINTS:  Follow up cancer of the right colon   Oncology History   Cancer Staging Cancer of right colon [facility]) Staging form: Colon and Rectum, AJCC 8th Edition - Pathologic stage from 10/14/2016: Stage IIC (pT4b, pN0, cM0) - Signed by Truitt Merle, MD on 11/07/2016       Cancer of right colon ([place])   09/19/2016 Imaging    CT A/P 09/19/16 IMPRESSION: Large 9.5 cm intraluminal mass in the cecum and ascending colon, consistent with colon carcinoma. Tumor extension into adjacent pericolonic fat seen along the lateral wall.  Mild right pericolonic and mesenteric lymphadenopathy, suspicious for metastatic disease. No other sites of metastatic disease identified.  Colonic diverticulosis, without radiographic evidence of diverticulitis. Tiny hiatal hernia, and small epigastric ventral hernia containing only fat.  Aortic atherosclerosis.      10/14/2016 Initial Diagnosis    Cancer of right colon ([place])      10/14/2016 Surgery    OPEN RIGHT HEMICOLECTOMY and REPAIR OF EPIGASTRIC VENTRAL HERNIA by Dr. Georgette Dover and Dr. Dalbert Batman       10/14/2016 Pathology Results    10/14/16 Diagnosis Colon, segmental resection for tumor, Right ADENOCARCINOMA WITH EXTENSIVE EXTRA CELLULAR MUCIN, GRADE 1, (11.0 CM) THE TUMOR INVADES THROUGH THE CECUM WALL INTO ADJACENT TERMINAL ILEUM (PT4B) NINETEEN BENIGN LYMPH NODES (0/19) SUPPURATIVE INFLAMMATION WITH FIBROSIS AND ADHESION TO ABDOMINAL WALL NO ADENOCARCINOMA IDENTIFIED      11/19/2016 Imaging    CT Chest  WO Contrast 11/19/16 IMPRESSION: 1. No evidence of metastatic disease. 2. Aortic atherosclerosis (ICD10-170.0). Coronary artery calcification.      11/25/2016 - 03/24/2017 Chemotherapy    Adjuvant Xeloda two weeks on, one week off starting 11/25/16 for 3 months        12/21/2016 Genetic Testing    Patient had genetic testing due to a personal history of colon cancer and a family history of breast, uterine, and colon cancer. The Common Hereditary Cancers Panel was ordered.  The Hereditary Gene Panel offered by [facility] includes sequencing and/or deletion duplication testing of the following 47 genes: APC, ATM, AXIN2, BARD1, BMPR1A, BRCA1, BRCA2, BRIP1, CDH1, CDKN2A (p14ARF), CDKN2A (p16INK4a), CHEK2, CDK4, CTNNA1, DICER1, EPCAM (Deletion/duplication testing only), GREM1 (promoter region deletion/duplication testing only), KIT, MEN1, MLH1, MSH2, MSH3, MSH6, MUTYH, NBN, NF1, NHTL1, PALB2, PDGFRA, PMS2, POLD1, POLE, PTEN, RAD50, RAD51C, RAD51D, SDHB, SDHC, SDHD, SMAD4, SMARCA4. STK11, TP53, TSC1, TSC2, and VHL.  The following genes were evaluated for sequence changes only: SDHA and HOXB13 c.251G>A variant only.  Results: Negative- no pathogenic variants identified.  The date of this test report is 12/21/2016.       04/27/2017 Imaging    CT AP W Contrast 04/27/17 IMPRESSION: Status post right hemicolectomy. No evidence of recurrent or metastatic disease. Additional stable ancillary findings as above.        HISTORY OF PRESENTING ILLNESS:  Ashley Peters 84 y.o. female is here because of cancer of the right colon. She presents with her daughter and husband. Initially, pt presented to her PCP's office on 09/19/16 for a 1mocheck-up and with complaints of worsening right lower quadrant  pain that had been ongoing for several weeks. This was accompanied by fatigue, chills, and weight loss of approximately 30lbs over the past year. However, she reports that she was enrolled in a nutrition program for  diabetics to help her lose weight (she is only borderline diabetic). Her daughter was able to notice her fatigue as well. She had not noticed any bloody stools, however, she did notice some melena but was on iron supplements for her anemia. She denies any significant constipation with this. She had also noticed a small area of distension over her area of pain in the right lower abdomen which she had not noticed previously. CT A/P was performed the same day which was remarkable for a large 9.5cm intraluminal mass in the cecum and ascending colon, consistent with colon carcinoma. There was tumor extension into the adjacent pericolonic fat seen along the lateral abdominal wall as well. Additionally, mild right pericolonic and mesenteric lymphadenopathy was also seen which was suspicious for metastatic disease. An epigastric ventral hernia was also found to be present on this scan.   Following these findings, she was subsequently referred to Dr Georgette Dover who performed both an open right hemicolectomy and epigastric ventral hernia repair on 10/14/16. She tolerated this procedure well and was monitored in the hospital until her discharge on 10/17/16, during which she recovered well. She was referred to medical oncology following her surgery and she has been recovering well since d/c. Since her surgery she denies any pain returning to the area. Her bowel movements have been normal and regular since her surgery and her appetite has been well. Her fatigue has also been improving and back to normal.   She currently lives alone in [place], [place] and very independent and active.    CURRENT THERAPY:  surveillance   INTERVAL HISTORY:  Ashley Peters is here for a follow up. She presents to the clinic today accompanied by her family member. She feels well overall. She has gained almost 20 Ibs, has good appetite and energy level.  She lives independently.  She has still has mild intermittent abdominal discomfort and a mild  diarrhea every other day. She eats well. No recent change in medications.  She is active at home and is independent.   MEDICAL HISTORY:  Past Medical History:  Diagnosis Date  . Anemia   . Cancer ([place])   . Colon cancer ([place])   . Diverticulosis   . Family history of breast cancer   . Family history of colon cancer   . Family history of uterine cancer   . GERD (gastroesophageal reflux disease)   . History of kidney stones   . Hyperlipidemia   . Hypertension   . Macular degeneration   . Osteopenia     SURGICAL HISTORY: Past Surgical History:  Procedure Laterality Date  . ABDOMINAL HYSTERECTOMY    . ablation tr Great saphenous vein  10/17/2009  . BREAST EXCISIONAL BIOPSY Bilateral 1971  . brest biopsy  1971  . CHOLECYSTECTOMY    . EPIGASTRIC HERNIA REPAIR N/A 10/14/2016   Procedure: REPAIR OF EPIGASTRIC VENTRAL HERNIA;  Surgeon: Donnie Mesa, MD;  Location: [facility];  Service: General;  Laterality: N/A;  . lt. distal ureteral stone extraction  09/14/1991  . PARTIAL COLECTOMY Right 10/14/2016   Procedure: OPEN RIGHT HEMICOLECTOMY;  Surgeon: Donnie Mesa, MD;  Location: [facility];  Service: General;  Laterality: Right;    SOCIAL HISTORY: Social History   Socioeconomic History  . Marital status: Widowed  Spouse name: Not on file  . Number of children: 3  . Years of education: 6  . Highest education level: Some college, no degree  Occupational History  . Occupation: Retired    Comment: Network engineer  Social Needs  . Financial resource strain: Not hard at all  . Food insecurity:    Worry: Never true    Inability: Never true  . Transportation needs:    Medical: No    Non-medical: No  Tobacco Use  . Smoking status: Never Smoker  . Smokeless tobacco: Never Used  Substance and Sexual Activity  . Alcohol use: No  . Drug use: No  . Sexual activity: Not Currently  Lifestyle  . Physical activity:    Days per week: 3 days    Minutes per session: 60 min  . Stress: Only a  little  Relationships  . Social connections:    Talks on phone: More than three times a week    Gets together: More than three times a week    Attends religious service: More than 4 times per year    Active member of club or organization: Yes    Attends meetings of clubs or organizations: More than 4 times per year    Relationship status: Widowed  . Intimate partner violence:    Fear of current or ex partner: Not on file    Emotionally abused: Not on file    Physically abused: Not on file    Forced sexual activity: Not on file  Other Topics Concern  . Not on file  Social History Narrative  . Not on file    FAMILY HISTORY: Family History  Problem Relation Age of Onset  . Stroke Father 68  . Breast cancer Sister        dx 42's, died at 25  . Seizures Son   . Diabetes Brother   . Kidney disease Brother   . Heart disease Brother   . Epilepsy Son   . Colon cancer Other 48  . Uterine cancer Other 42  . Hodgkin's lymphoma Other     ALLERGIES:  is allergic to benicar [olmesartan medoxomil] and amoxicillin.  MEDICATIONS:  Current Outpatient Medications  Medication Sig Dispense Refill  . acetaminophen (TYLENOL) 500 MG tablet Take 2 tablets (1,000 mg total) by mouth every 6 (six) hours as needed for moderate pain. 30 tablet 0  . cholecalciferol (VITAMIN D) 1000 units tablet Take 1,000 Units by mouth daily.    . diphenoxylate-atropine (LOMOTIL) 2.5-0.025 MG tablet Take 1 tablet 4 (four) times daily as needed by mouth for diarrhea or loose stools. 60 tablet 0  . ferrous sulfate 325 (65 FE) MG tablet TAKE 1 TABLET (325 MG TOTAL) BY MOUTH 2 (TWO) TIMES DAILY WITH A MEAL. 60 tablet 5  . hydrocortisone cream 1 % Apply 1 application topically as needed for itching.    . Lidocaine-Glycerin (PREPARATION H) 5-14.4 % CREA Place 1 application rectally as needed (itching).    . Menthol-Camphor (ICY HOT ARTHRITIS PAIN RELIEF EX) Apply 1 application topically as needed (joint pain).    .  methocarbamol (ROBAXIN) 500 MG tablet Take 1 tablet (500 mg total) by mouth 2 (two) times daily. (Patient not taking: Reported on 08/27/2017) 20 tablet 0  . Multiple Vitamins-Minerals (PRESERVISION AREDS 2 PO) Take 1 capsule by mouth 2 (two) times daily.    . traMADol (ULTRAM) 50 MG tablet Take 1 tablet (50 mg total) by mouth every 6 (six) hours as needed. (Patient not  taking: Reported on 08/27/2017) 15 tablet 0   Current Facility-Administered Medications  Medication Dose Route Frequency Provider Last Rate Last Dose  . cyanocobalamin ((VITAMIN B-12)) injection 1,000 mcg  1,000 mcg Intramuscular Q30 days Dettinger, [name], MD   1,000 mcg at 08/05/17 9244    REVIEW OF SYSTEMS:  Constitutional: Denies fevers, chills or abnormal night sweats Good appetite. Eyes: Denies blurriness of vision, double vision or watery eyes Ears, nose, mouth, throat, and face: Denies mucositis or sore throat Respiratory: Denies cough, dyspnea or wheezes GI: (+) Diarrhea and abdominal pain Cardiovascular: Denies palpitation, chest discomfort Gastrointestinal:  Denies heartburn  (+) hemorrhoids  Skin: Denies abnormal skin rashes  Lymphatics: Denies new lymphadenopathy or easy bruising Neurological:Denies numbness, tingling or new weaknesses Behavioral/Psych: Mood is stable, no new changes  MSK: No myalgias  All other systems were reviewed with the patient and are negative.  PHYSICAL EXAMINATION:  ECOG PERFORMANCE STATUS: 0  Vitals:   08/27/17 0836  BP: (!) 178/89  Pulse: 77  Resp: 18  Temp: (!) 97.5 F (36.4 C)  SpO2: 97%   Filed Weights   08/27/17 0836  Weight: 130 lb 6.4 oz (59.1 kg)     GENERAL:alert, no distress and comfortable SKIN: skin color, texture, turgor are normal, no rashes or significant lesions EYES: normal, conjunctiva are pink and non-injected, sclera clear OROPHARYNX:no exudate, no erythema and lips, buccal mucosa, and tongue normal  NECK: supple, thyroid normal size, non-tender,  without nodularity LYMPH:  no palpable lymphadenopathy in the cervical, axillary or inguinal LUNGS: clear to auscultation and percussion with normal breathing effort HEART: regular rhythm and no murmurs No pitting edema. ABDOMEN:abdomen soft, non-tender and normal bowel sounds. Midline surgical incision has healed well. Musculoskeletal:no cyanosis of digits and no clubbing  PSYCH: alert & oriented x 3 with fluent speech NEURO: no focal motor/sensory deficits  LABORATORY DATA:  I have reviewed the data as listed CBC Latest Ref Rng & Units 08/27/2017 04/06/2017 04/05/2017  WBC 3.9 - 10.3 K/uL 5.7 7.3 7.0  Hemoglobin 11.6 - 15.9 g/dL 14.5 13.9 12.9  Hematocrit 34.8 - 46.6 % 43.4 42.3 39.7  Platelets 145 - 400 K/uL 193 199 189   CMP Latest Ref Rng & Units 08/27/2017 04/06/2017 04/05/2017  Glucose 70 - 140 mg/dL 94 90 91  BUN 7 - 26 mg/dL '12 16 15  ' Creatinine 0.60 - 1.10 mg/dL 0.69 0.69 0.56  Sodium 136 - 145 mmol/L 141 142 141  Potassium 3.5 - 5.1 mmol/L 3.8 4.0 3.9  Chloride 98 - 109 mmol/L 106 108 109  CO2 22 - 29 mmol/L '27 27 23  ' Calcium 8.4 - 10.4 mg/dL 9.4 9.3 9.0  Total Protein 6.4 - 8.3 g/dL 6.5 6.4 5.3(L)  Total Bilirubin 0.2 - 1.2 mg/dL 0.9 0.6 1.0  Alkaline Phos 40 - 150 U/L 81 82 61  AST 5 - 34 U/L '25 25 29  ' ALT 0 - 55 U/L '14 24 18    ' PATHOLOGY REPORTS:  12/15/2016 Molecular Pathology Negative result. No Pathogenic sequence variants or deletions/duplications Identified.  10/14/16 Diagnosis Colon, segmental resection for tumor, Right ADENOCARCINOMA WITH EXTENSIVE EXTRA CELLULAR MUCIN, GRADE 1, (11.0 CM) THE TUMOR INVADES THROUGH THE CECUM WALL INTO ADJACENT TERMINAL ILEUM (PT4B) NINETEEN BENIGN LYMPH NODES (0/19) SUPPURATIVE INFLAMMATION WITH FIBROSIS AND ADHESION TO ABDOMINAL WALL NO ADENOCARCINOMA IDENTIFIED Microscopic Comment COLON AND RECTUM (INCLUDING TRANS-ANAL RESECTION): Specimen: Right colon with terminal ileum Procedure: Segmental resection Tumor site:  Cecum and terminal ileum Specimen integrity: Intact Macroscopic intactness  of mesorectum: Not applicable: X Complete: NA Near complete: NA Incomplete: NA Cannot be determined (specify): NA Macroscopic tumor perforation: Invades adjacent structure Invasive tumor: Maximum size: 11.0 cm Histologic type(s): Adenocarcinoma with extracellular mucin Histologic grade and differentiation: G1: well differentiated/low grade G2: moderately differentiated/low grade G3: poorly differentiated/high grade G4: undifferentiated/high grade Type of polyp in which invasive carcinoma arose: Tubular adenoma Microscopic extension of invasive tumor: Invades adjacent terminal ileum 1 of 5 Supplemental copy SUPPLEMENTAL for AMEIRAH, KHATOON ([id number]) Microscopic Comment(continued) Lymph-Vascular invasion: Negative Peri-neural invasion: Negative Tumor deposit(s) (discontinuous extramural extension): Negative Resection margins: Proximal margin: Negative Distal margin: Negative Circumferential (radial) (posterior ascending, posterior descending; lateral and posterior mid-rectum; and entire lower 1/3 rectum):NA Mesenteric margin (sigmoid and transverse): NA Distance closest margin (if all above margins negative): 1.5 cm from abdominal wall adhesion resection Trans-anal resection margins only: Deep margin: NA Mucosal Margin: NA Distance closest mucosal margin (if negative): NA Treatment effect (neo-adjuvant therapy): Negative Additional polyp(s): Negative Non-neoplastic findings: Unremarkable Lymph nodes: number examined 19; number positive: 0 Pathologic Staging: pT4b, pN0, pMx Ancillary studies: Ordered  RADIOGRAPHIC STUDIES: I have personally reviewed the radiological images as listed and agreed with the findings in the report.  Dg Wrfm Dexa  Result Date: 08/07/2017 EXAM: DUAL X-RAY ABSORPTIOMETRY (DXA) FOR BONE MINERAL DENSITY IMPRESSION: Dear Ashley Peters, Your patient Ashley Peters  completed a BMD test on 08/06/2017 using the [place] (software version: 17 SP 1) manufactured by [employer]. The following summarizes the results of our evaluation. PATIENT BIOGRAPHICAL: Name: Ashley Peters, Ashley Peters Patient ID: [id number] Birth Date: Jan 01, 1929 Height: 58.3 in. Gender: Female Exam Date: 08/06/2017 Weight: 131.0 lbs. Indications: Advanced Age, Caucasian, Family History of Fracture, FAMILY HX OF OSTEOPOROSIS, HISTORY OF CANCER, OSTEOPENIA, Post Menopausal, Family Hist. (Parent hip fracture) Fractures: Treatments: Vitamin D DENSITOMETRY RESULTS: Site Region Meas'd Date Meas'd Age WHO Class. YA T-score BMD %Chg Prev. Sig. Chg (*) DualFemur Total Left 10/06/2017 [age] years Osteopenia -1.8 0.783 g/cm2 -17.6% * DualFemur Total Left 07/23/2015 84.9 [age] Normal -0.5 0.950 g/cm2 -8.5% * DualFemur Total Left 03/13/2010 79.5 years Normal 0.2 1.038 g/cm2 3.0% DualFemur Total Left 04/05/2007 76.6 years Normal 0.0 1.008 g/cm2 - DualFemur Total Mean 08/06/2017 86.9 [age] Osteopenia -1.8 0.784 g/cm2 -17.6% * DualFemur Total Mean 07/23/2015 84.9 years Normal -0.4 0.952 g/cm2 -5.4% * DualFemur Total Mean 03/13/2010 79.5 years Normal 0.0 1.006 g/cm2 3.1% * DualFemur Total Mean 04/05/2007 76.6 years Normal -0.3 0.976 g/cm2 - Left Forearm Radius 33% 08/06/2017 [age] Osteoporosis -3.9 0.543 g/cm2 - - ASSESSMENT: The BMD measured at Forearm Radius 33% is 0.543 g/cm2 with a T-score of -3.9. This patient is considered osteoporotic according to [facility]) criteria. Compared with the prior study on 07/23/2015 ,the BMD of the total hip shows a statistically significant decrease. Scan quality good. Lumbar spine was excluded due to advanced degenerative changes. - World Health Organization [facility]) criteria for post-menopausal, Caucasian Women: Normal:       T-score at or above -1 SD Osteopenia:   T-score between -1 and -2.5 SD Osteoporosis: T-score at or below -2.5 SD -  RECOMMENDATIONS: 1. All patients should optimize calcium and vitamin D intake. 2. Consider FDA-approved medical therapies in postmenopausal women and men aged 63 years and older, based on the following: a. Hip or vertebral (clinical or morphometric ) fracture. b. T-score < -2.5 at the femoral neck or spine after appropriate evaluation to exclude secondary causes c.Low bone mass (T-score between -1.0  and -2.5 at the femoral neck or spine) and a 10 -year probability of a hip fracture > 3% or a 10- year probability of a major osteoporosis- related fracture>20% based on the [place]-adapted WHO algorithm d. Clinician judgment and/or patient preferences may inidcate treatment for people with 10 -year fracture probabilities above or below these levels. - FOLLOW-UP: People with diagnosed cases of osteoporosis or at high risk for fracture should have regular bone mineral density tests. For patients eligible for Medicare, routine testing is allowed once every 2 years. The testing frequency can be increased to one year for patients who have rapidly progressing disease, those who are receiving or discontinuing medical therapy to restore bone mass, or have additional risk factors. FRAX* RESULTS:  (version: 3.8) 10-year Probability of Fracture1 Major Osteoporotic Fracture2 Hip Fracture 22.6%                        14.0% Population:                  [place] (Caucasian) Risk Factors:           Family Hist. (Parent hip fracture) Based on DualFemur (Right) Neck BMD 1 -The 10-year probability of fracture may be lower than reported if the patient has received treatment. 2 -Major Osteoporotic Fracture: Clinical Spine, Forearm, Hip or Shoulder *FRAX is a Materials engineer of the [employer] for Metabolic Bone Disease, a [place] (WHO) [facility]. ASSESSMENT: The probability of a major osteoporotic fracture is 22.6% within the next ten years. The probability of a hip fracture is 14.0% within  the next ten years. Electronically Signed   By: Earle Gell M.D.   On: 08/07/2017 17:04      CT AP W Contrast 04/27/17 IMPRESSION: Status post right hemicolectomy. No evidence of recurrent or metastatic disease. Additional stable ancillary findings as above.   CT Chest WO Contrast 11/19/16 IMPRESSION: 1. No evidence of metastatic disease. 2. Aortic atherosclerosis (ICD10-170.0). Coronary artery calcification.  CT A/P 09/19/16 IMPRESSION: Large 9.5 cm intraluminal mass in the cecum and ascending colon, consistent with colon carcinoma. Tumor extension into adjacent pericolonic fat seen along the lateral wall. Mild right pericolonic and mesenteric lymphadenopathy, suspicious for metastatic disease. No other sites of metastatic disease identified. Colonic diverticulosis, without radiographic evidence of diverticulitis. Tiny hiatal hernia, and small epigastric ventral hernia containing only fat. Aortic atherosclerosis.  ASSESSMENT: 84 y.o. Caucasian female  1.) Cancer of the right colon into the cecum, invasive adenocarcinoma, G1, pT4bN0Mx stage IIA,   MSI-stable  -I previously discuss with her the surgical pathology results. She was found to have right-sided colon cancer beginning in the cecum. She had an impressively large at 11cm tumor which invaded around the adjacent small intestine which was removed without complication by Dr Georgette Dover. Her surgical margins were negative. All 19 lymph nodes were negative. -I reviewed her staging CT abd/pel scan and chest, which was negative for metastatic disease.  -She had stage II disease, early-stage but with high risk features, because of her T4b tumor  - I discussed her high recurrence risk due to T4b lesion, especially local recurrence  -She agreed to try adjuvant chemotherapy with Xeloda, had a severe diarrhea after second cycle, eventually recovered. She re-started Xeloda at suggested low dose 1037m BID (1 week on and 1 weeks off) on 02/10/17,  tolerating well overall  -She has completed adjuvant Xeloda (a total of 3 months) -Ct AP W Contrast from  04/27/17 reveals no evidence of recurrent or metastatic disease. I discussed results with pt and her family. They are pleased. Labs reveiwed, they are WNL. No clinical concern for recurrence.  -She has developed some left hip and thigh pain since she completed chemo, improved with hip joint injection, resolved now  -She is clinically doing very well. Lab reviewed, exam was unremarkable, no clinical concern for recurrence..  -She is due for several incontinent anoscopy 1 year after surgery.  Referred on  08/27/2017 for colonoscopy. -F/u in 3 months with surveillance CT scan a few days before.   2.) Genetics -Due to her prevalent family history of cancer, I previously suggested that she should follow up with genetics for testing. She would like to have this performed.  -She does have two sons and one daughter, I also informed her that if her genetic testing were to come back with abnormal results that her children should be tested as well.  -Also advised that her children should have routine colonoscopies.  -She completed genetic testing 12/03/16 and her results are negative  3.) Anemia  -Secondary to colon cancer, surgery and iron deficiency. -She is still on iron pills. Labs today showed a normal iron level, will stop her oral iron supplement.   4.) Borderline DM and HTN: -She was on BP medication prior to her weight loss, she was taken off of this following her weight loss.   -Continue with weight loss plan for management and I encouraged her to maintain a good diet to manage her sugars.  -previously advised the patient to monitor her blood pressure at home. Patient blood pressure was 171/77 on 05/04/17 -She will continue f/u w/ her PCP for this.   5. Osteoporosis - Seen on 07/27/17 Bone density scan - F/u with PCP for tx options    PLAN:  F/u in 3 months Lab and CT chest/abd/pel  with contrast a few days before  Referral to [facility] for colonoscopy.      Orders Placed This Encounter  Procedures  . CT Abdomen Pelvis W Contrast    Standing Status:   Future    Standing Expiration Date:   08/27/2018    Order Specific Question:   If indicated for the ordered procedure, I authorize the administration of contrast media per Radiology protocol    Answer:   Yes    Order Specific Question:   Preferred imaging location?    Answer:   [facility]    Order Specific Question:   Is Oral Contrast requested for this exam?    Answer:   Yes, Per Radiology protocol    Order Specific Question:   Radiology Contrast Protocol - do NOT remove file path    Answer:   [email or web address]  . CT Chest W Contrast    Standing Status:   Future    Standing Expiration Date:   08/27/2018    Order Specific Question:   If indicated for the ordered procedure, I authorize the administration of contrast media per Radiology protocol    Answer:   Yes    Order Specific Question:   Preferred imaging location?    Answer:   [facility]    Order Specific Question:   Radiology Contrast Protocol - do NOT remove file path    Answer:   [email or web address]  . Ambulatory referral to Gastroenterology    Referral Priority:   Routine    Referral Type:   Consultation    Referral Reason:   Specialty  Services Required    Number of Visits Requested:   1    All questions were answered. The patient knows to call the clinic with any problems, questions or concerns.I spent 20  minutes counseling the patient face to face. The total time spent in the appointment was 25 minutes and more than 50% was on counseling.   Dierdre Searles Dweik am acting as scribe for Dr. Truitt Merle.  I have reviewed the above documentation for accuracy and completeness, and I agree with the above.    Truitt Merle, MD 08/27/2017 1:40 PM

## 2017-08-27 ENCOUNTER — Telehealth: Payer: Self-pay | Admitting: Hematology

## 2017-08-27 ENCOUNTER — Inpatient Hospital Stay: Payer: Medicare Other | Attending: Hematology | Admitting: Hematology

## 2017-08-27 ENCOUNTER — Encounter: Payer: Self-pay | Admitting: Hematology

## 2017-08-27 ENCOUNTER — Inpatient Hospital Stay: Payer: Medicare Other

## 2017-08-27 VITALS — BP 178/89 | HR 77 | Temp 97.5°F | Resp 18 | Ht 58.25 in | Wt 130.4 lb

## 2017-08-27 DIAGNOSIS — K439 Ventral hernia without obstruction or gangrene: Secondary | ICD-10-CM | POA: Diagnosis not present

## 2017-08-27 DIAGNOSIS — M810 Age-related osteoporosis without current pathological fracture: Secondary | ICD-10-CM | POA: Diagnosis not present

## 2017-08-27 DIAGNOSIS — I10 Essential (primary) hypertension: Secondary | ICD-10-CM

## 2017-08-27 DIAGNOSIS — C182 Malignant neoplasm of ascending colon: Secondary | ICD-10-CM

## 2017-08-27 DIAGNOSIS — D509 Iron deficiency anemia, unspecified: Secondary | ICD-10-CM

## 2017-08-27 DIAGNOSIS — D500 Iron deficiency anemia secondary to blood loss (chronic): Secondary | ICD-10-CM

## 2017-08-27 DIAGNOSIS — Z85038 Personal history of other malignant neoplasm of large intestine: Secondary | ICD-10-CM | POA: Insufficient documentation

## 2017-08-27 LAB — IRON AND TIBC
IRON: 90 ug/dL (ref 41–142)
Saturation Ratios: 35 % (ref 21–57)
TIBC: 259 ug/dL (ref 236–444)
UIBC: 169 ug/dL

## 2017-08-27 LAB — CBC WITH DIFFERENTIAL (CANCER CENTER ONLY)
BASOS PCT: 1 %
Basophils Absolute: 0 10*3/uL (ref 0.0–0.1)
EOS ABS: 0.1 10*3/uL (ref 0.0–0.5)
Eosinophils Relative: 2 %
HCT: 43.4 % (ref 34.8–46.6)
Hemoglobin: 14.5 g/dL (ref 11.6–15.9)
Lymphocytes Relative: 23 %
Lymphs Abs: 1.3 10*3/uL (ref 0.9–3.3)
MCH: 30.1 pg (ref 25.1–34.0)
MCHC: 33.4 g/dL (ref 31.5–36.0)
MCV: 90.2 fL (ref 79.5–101.0)
MONO ABS: 0.4 10*3/uL (ref 0.1–0.9)
MONOS PCT: 6 %
NEUTROS PCT: 68 %
Neutro Abs: 3.9 10*3/uL (ref 1.5–6.5)
Platelet Count: 193 10*3/uL (ref 145–400)
RBC: 4.81 MIL/uL (ref 3.70–5.45)
RDW: 12.9 % (ref 11.2–14.5)
WBC Count: 5.7 10*3/uL (ref 3.9–10.3)

## 2017-08-27 LAB — COMPREHENSIVE METABOLIC PANEL
ALK PHOS: 81 U/L (ref 40–150)
ALT: 14 U/L (ref 0–55)
AST: 25 U/L (ref 5–34)
Albumin: 3.9 g/dL (ref 3.5–5.0)
Anion gap: 8 (ref 3–11)
BILIRUBIN TOTAL: 0.9 mg/dL (ref 0.2–1.2)
BUN: 12 mg/dL (ref 7–26)
CALCIUM: 9.4 mg/dL (ref 8.4–10.4)
CO2: 27 mmol/L (ref 22–29)
CREATININE: 0.69 mg/dL (ref 0.60–1.10)
Chloride: 106 mmol/L (ref 98–109)
GFR calc non Af Amer: >60 mL/min (ref >60)
Glucose, Bld: 94 mg/dL (ref 70–140)
Potassium: 3.8 mmol/L (ref 3.5–5.1)
SODIUM: 141 mmol/L (ref 136–145)
TOTAL PROTEIN: 6.5 g/dL (ref 6.4–8.3)

## 2017-08-27 LAB — RETICULOCYTES
RBC.: 4.81 MIL/uL (ref 3.70–5.45)
RETIC CT PCT: 1.1 % (ref 0.7–2.1)
Retic Count, Absolute: 52.9 10*3/uL (ref 33.7–90.7)

## 2017-08-27 LAB — CEA (IN HOUSE-CHCC): CEA (CHCC-IN HOUSE): 3.38 ng/mL (ref 0.00–5.00)

## 2017-08-27 LAB — FERRITIN: Ferritin: 113 ng/mL (ref 9–269)

## 2017-08-27 NOTE — Telephone Encounter (Signed)
Appointments scheduled, contrast material w/ instructions given, referral for GI has been entered in proficient per 6/20 los

## 2017-08-31 ENCOUNTER — Encounter: Payer: Self-pay | Admitting: Gastroenterology

## 2017-09-03 ENCOUNTER — Ambulatory Visit (INDEPENDENT_AMBULATORY_CARE_PROVIDER_SITE_OTHER): Payer: Medicare Other | Admitting: *Deleted

## 2017-09-03 DIAGNOSIS — E538 Deficiency of other specified B group vitamins: Secondary | ICD-10-CM

## 2017-09-03 NOTE — Progress Notes (Signed)
Pt given Cyanocobalamin inj Tolerated well 

## 2017-09-07 ENCOUNTER — Ambulatory Visit (INDEPENDENT_AMBULATORY_CARE_PROVIDER_SITE_OTHER): Payer: Medicare Other | Admitting: Family Medicine

## 2017-09-07 ENCOUNTER — Encounter: Payer: Self-pay | Admitting: Family Medicine

## 2017-09-07 VITALS — BP 131/85 | HR 80 | Temp 98.4°F | Ht 58.25 in | Wt 131.0 lb

## 2017-09-07 DIAGNOSIS — M810 Age-related osteoporosis without current pathological fracture: Secondary | ICD-10-CM | POA: Diagnosis not present

## 2017-09-07 MED ORDER — ALENDRONATE SODIUM 70 MG PO TABS: 70.0000 mg | ORAL_TABLET | ORAL | 3 refills | Status: DC

## 2017-09-07 NOTE — Progress Notes (Signed)
BP 131/85   Pulse 80   Temp 98.4 F (36.9 C) (Oral)   Ht 4' 10.25" (1.48 m)   Wt 131 lb (59.4 kg)   BMI 27.14 kg/m    Subjective:    Patient ID: Ashley Peters, female    DOB: 12-Jul-1928, 84 y.o.   MRN: [id number]  HPI: Ashley Peters is a 84 y.o. female presenting on 09/07/2017 for Discuss Dexa Scan   HPI Discussed DEXA scan results Patient is coming in today to discuss DEXA scan results and osteoporosis.  She recently had a DEXA scan where her T score was -3.9.  Her previous DEXA scan 2 years ago showed a T score of 0.8.  She had been taking vitamin D but not calcium during this time.  She denies any fractures or breaks but is coming in to discuss options to help prevent those.  Relevant past medical, surgical, family and social history reviewed and updated as indicated. Interim medical history since our last visit reviewed. Allergies and medications reviewed and updated.  Review of Systems  Constitutional: Negative for chills and fever.  Eyes: Negative for visual disturbance.  Respiratory: Negative for chest tightness and shortness of breath.   Cardiovascular: Negative for chest pain and leg swelling.  Musculoskeletal: Negative for back pain and gait problem.  Skin: Negative for rash.  Neurological: Negative for light-headedness and headaches.  Psychiatric/Behavioral: Negative for agitation and behavioral problems.  All other systems reviewed and are negative.   Per HPI unless specifically indicated above   Allergies as of 09/07/2017      Reactions   Benicar [olmesartan Medoxomil]    Elevated Potassium   Amoxicillin Rash   Has patient had a PCN reaction causing immediate rash, facial/tongue/throat swelling, SOB or lightheadedness with hypotension: Yes Has patient had a PCN reaction causing severe rash involving mucus membranes or skin necrosis: No Has patient had a PCN reaction that required hospitalization: No Has patient had a PCN reaction occurring within the last  10 years: No If all of the above answers are "NO", then may proceed with Cephalosporin use.      Medication List        Accurate as of 09/07/17  1:49 PM. Always use your most recent med list.          acetaminophen 500 MG tablet Commonly known as:  TYLENOL Take 2 tablets (1,000 mg total) by mouth every 6 (six) hours as needed for moderate pain.   alendronate 70 MG tablet Commonly known as:  FOSAMAX Take 1 tablet (70 mg total) by mouth every 7 (seven) days. Take with a full glass of water on an empty stomach.   cholecalciferol 1000 units tablet Commonly known as:  VITAMIN D Take 1,000 Units by mouth daily.   diphenoxylate-atropine 2.5-0.025 MG tablet Commonly known as:  LOMOTIL Take 1 tablet 4 (four) times daily as needed by mouth for diarrhea or loose stools.   ferrous sulfate 325 (65 FE) MG tablet TAKE 1 TABLET (325 MG TOTAL) BY MOUTH 2 (TWO) TIMES DAILY WITH A MEAL.   PRESERVISION AREDS 2 PO Take 1 capsule by mouth 2 (two) times daily.          Objective:    BP 131/85   Pulse 80   Temp 98.4 F (36.9 C) (Oral)   Ht 4' 10.25" (1.48 m)   Wt 131 lb (59.4 kg)   BMI 27.14 kg/m   Wt Readings from Last 3 Encounters:  09/07/17 131 lb (  59.4 kg)  08/27/17 130 lb 6.4 oz (59.1 kg)  08/06/17 131 lb (59.4 kg)    Physical Exam  Constitutional: She is oriented to person, place, and time. She appears well-developed and well-nourished. No distress.  Eyes: Conjunctivae are normal.  Neurological: She is alert and oriented to person, place, and time. Coordination normal.  Skin: Skin is warm and dry. No rash noted. She is not diaphoretic.  Psychiatric: She has a normal mood and affect. Her behavior is normal.  Nursing note and vitals reviewed.   Bone density scan shows T score of -3.9    Assessment & Plan:   Problem List Items Addressed This Visit    None    Visit Diagnoses    Age-related osteoporosis without current pathological fracture    -  Primary   Patient's last  bone density was normal and this 1 is T of -3.9, will start Fosamax and instructed patient to take calcium and vitamin D together   Relevant Medications   alendronate (FOSAMAX) 70 MG tablet     We will start patient on Fosamax and recommended taking vitamin D and calcium together and given instructions on how to take Fosamax and vitamin D and calcium.  Follow up plan: Return if symptoms worsen or fail to improve.  Counseling provided for all of the vaccine components No orders of the defined types were placed in this encounter.   Caryl Pina, MD [facility] 09/07/2017, 1:49 PM

## 2017-09-07 NOTE — Patient Instructions (Signed)
Alendronate oral solution What is this medicine? ALENDRONATE (a LEN droe nate) slows calcium loss from bones. It helps to make normal healthy bone and to slow bone loss in people with Paget's disease and osteoporosis. It may be used in others at risk for bone loss. This medicine may be used for other purposes; ask your health care provider or pharmacist if you have questions. COMMON BRAND NAME(S): Fosamax What should I tell my health care provider before I take this medicine? They need to know if you have any of these conditions: -esophagus, stomach, or intestine problems, like acid reflux or GERD -dental disease -kidney disease -low blood calcium -low vitamin D -problems swallowing -problems sitting or standing for 30 minutes -an unusual or allergic reaction to alendronate, other medicines, foods, dyes, or preservatives -pregnant or trying to get pregnant -breast-feeding How should I use this medicine? You must take this medicine exactly as directed or you will lower the amount of the medicine you absorb into your body or you may cause yourself harm. Take your dose by mouth first thing in the morning, after you are up for the day. Do not eat or drink anything before you take this solution. Use a specially marked spoon or container to measure the oral solution. Take the solution with 2 fluid ounces (1/4 cup) of plain water. Do not take the solution with any other drink. After taking this medicine do not eat breakfast, drink, or take any other medicines or vitamins for at least 30 minutes. Stand or sit up for at least 30 minutes after you take this medicine; do not lie down. Do not take your medicine more often than directed. Take this medicine on the same day every week. Talk to your pediatrician regarding the use of this medicine in children. Special care may be needed. Overdosage: If you think you have taken too much of this medicine contact a poison control center or emergency room at  once. NOTE: This medicine is only for you. Do not share this medicine with others. What if I miss a dose? If you miss a dose, take the dose on the morning after you remember. Then take your next dose on your regular day of the week. Never take 2 doses on the same day. Do not take double or extra doses. What may interact with this medicine? -aluminum hydroxide -antacids -aspirin -calcium supplements -drugs for inflammation like ibuprofen, naproxen, and others -iron supplements -magnesium supplements -vitamins with minerals This list may not describe all possible interactions. Give your health care provider a list of all the medicines, herbs, non-prescription drugs, or dietary supplements you use. Also tell them if you smoke, drink alcohol, or use illegal drugs. Some items may interact with your medicine. What should I watch for while using this medicine? Visit your doctor for regular check ups. It may be some time before you see benefit from this medicine. Do not stop taking your medicine unless your doctor tells you to. Your doctor or health care professional may order regular blood tests to see how you are doing. You should make sure you get enough calcium and vitamin D in your diet while you are taking this medicine, unless your doctor tells you not to. Discuss the foods you eat and the vitamins you take with your health care professional. Some people who take this medicine have severe bone, joint, and/or muscle pain. This medicine may also increase your risk for a broken thigh bone. Tell your doctor right away if you  have pain in your upper leg or groin. Tell your doctor if you have any pain that does not go away or that gets worse. This medicine can make you more sensitive to the sun. If you get a rash while taking this medicine, sunlight may cause the rash to get worse. Keep out of the sun. If you cannot avoid being in the sun, wear protective clothing and use sunscreen. Do not use sun lamps  or tanning beds/booths. What side effects may I notice from receiving this medicine? Side effects that you should report to your doctor or health care professional as soon as possible: -allergic reactions like skin rash, itching or hives, swelling of the face, lips, or tongue -black or tarry stools -bone, muscle, or joint pain -changes in vision -chest pain -heartburn or stomach pain -jaw pain, especially after dental work -redness, blistering, peeling or loosening of the skin, including inside the mouth -trouble or pain when swallowing Side effects that usually do not require medical attention (report to your doctor or health care professional if they continue or are bothersome): -changes in taste -diarrhea or constipation -eye pain or itching -headache -nausea or vomiting -stomach gas or fullness This list may not describe all possible side effects. Call your doctor for medical advice about side effects. You may report side effects to FDA at 1-800-FDA-1088. Where should I keep my medicine? Keep out of the reach of children. Store at room temperature of 15 and 30 degrees C (59 and 86 degrees F). Throw away any unused medicine after the expiration date. NOTE: This sheet is a summary. It may not cover all possible information. If you have questions about this medicine, talk to your doctor, pharmacist, or health care provider.  2018 Elsevier/Gold Standard (2010-08-23 08:54:51)

## 2017-09-30 DIAGNOSIS — H353113 Nonexudative age-related macular degeneration, right eye, advanced atrophic without subfoveal involvement: Secondary | ICD-10-CM | POA: Diagnosis not present

## 2017-09-30 DIAGNOSIS — H353212 Exudative age-related macular degeneration, right eye, with inactive choroidal neovascularization: Secondary | ICD-10-CM | POA: Diagnosis not present

## 2017-09-30 DIAGNOSIS — H43811 Vitreous degeneration, right eye: Secondary | ICD-10-CM | POA: Diagnosis not present

## 2017-09-30 DIAGNOSIS — H353123 Nonexudative age-related macular degeneration, left eye, advanced atrophic without subfoveal involvement: Secondary | ICD-10-CM | POA: Diagnosis not present

## 2017-10-05 ENCOUNTER — Ambulatory Visit (INDEPENDENT_AMBULATORY_CARE_PROVIDER_SITE_OTHER): Payer: Medicare Other | Admitting: *Deleted

## 2017-10-05 DIAGNOSIS — E538 Deficiency of other specified B group vitamins: Secondary | ICD-10-CM | POA: Diagnosis not present

## 2017-10-05 NOTE — Progress Notes (Signed)
Pt given Cyanocobalamin inj Tolerated well 

## 2017-10-23 ENCOUNTER — Other Ambulatory Visit: Payer: Self-pay | Admitting: Family Medicine

## 2017-10-30 ENCOUNTER — Ambulatory Visit (INDEPENDENT_AMBULATORY_CARE_PROVIDER_SITE_OTHER): Payer: Medicare Other | Admitting: Gastroenterology

## 2017-10-30 ENCOUNTER — Encounter: Payer: Self-pay | Admitting: Gastroenterology

## 2017-10-30 ENCOUNTER — Encounter (INDEPENDENT_AMBULATORY_CARE_PROVIDER_SITE_OTHER): Payer: Self-pay

## 2017-10-30 VITALS — BP 122/72 | HR 68 | Ht <= 58 in | Wt 133.0 lb

## 2017-10-30 DIAGNOSIS — Z85038 Personal history of other malignant neoplasm of large intestine: Secondary | ICD-10-CM | POA: Diagnosis not present

## 2017-10-30 MED ORDER — PEG-KCL-NACL-NASULF-NA ASC-C 140 G PO SOLR: 140.0000 g | ORAL | 0 refills | Status: DC

## 2017-10-30 NOTE — Patient Instructions (Signed)
If you are age 82 or older, your body mass index should be between 23-30. Your Body mass index is 28.78 kg/m. If this is out of the aforementioned range listed, please consider follow up with your Primary Care Provider.  If you are age 50 or younger, your body mass index should be between 19-25. Your Body mass index is 28.78 kg/m. If this is out of the aformentioned range listed, please consider follow up with your Primary Care Provider.   You have been scheduled for a colonoscopy. Please follow written instructions given to you at your visit today.  Please pick up your prep supplies at the pharmacy within the next 1-3 days. If you use inhalers (even only as needed), please bring them with you on the day of your procedure. Your physician has requested that you go to www.startemmi.com and enter the access code given to you at your visit today. This web site gives a general overview about your procedure. However, you should still follow specific instructions given to you by our office regarding your preparation for the procedure.  It was a pleasure to see you today!  Dr. Loletha Carrow

## 2017-10-30 NOTE — Progress Notes (Signed)
[facility] Consult Note:  History: Ashley Peters 10/30/2017  Referring physician: Truitt Merle, MD  Reason for consult/chief complaint: Colon Cancer Screening (hx of colon cancer, oncologist sent her to get colonoscopy, only cc- some diarrhea, no bleeding or abd pain)   Subjective  HPI:  This is a very pleasant 84 year old woman accompanied by her daughter, referred by her oncologist for history of colon cancer.  Brailee had right-sided abdominal pain July 2018 and primary care found a palpable mass.  CT scan showed a right-sided colon cancer that underwent right hemicolectomy with Dr. Georgette Dover July 2018.  She then had a total of 3 months of Xeloda therapy with some breaks in the midst of that due to diarrhea side effect. Her stools are more frequent since the surgery, but she has come to live with this and uses Imodium occasionally.  Her appetite is good, weight stable, she denies abdominal pain or rectal bleeding. ROS:  Review of Systems She denies chest pain or dyspnea.  Her functional status is very good.  Past Medical History: Past Medical History:  Diagnosis Date  . Anemia   . Cancer ([place])   . Colon cancer ([place])   . Diverticulosis   . Family history of breast cancer   . Family history of colon cancer   . Family history of uterine cancer   . GERD (gastroesophageal reflux disease)   . History of kidney stones   . Hyperlipidemia   . Hypertension   . Macular degeneration   . Osteopenia      Past Surgical History: Past Surgical History:  Procedure Laterality Date  . ABDOMINAL HYSTERECTOMY    . ablation tr Great saphenous vein  10/17/2009  . BREAST EXCISIONAL BIOPSY Bilateral 1971  . brest biopsy  1971  . CHOLECYSTECTOMY    . EPIGASTRIC HERNIA REPAIR N/A 10/14/2016   Procedure: REPAIR OF EPIGASTRIC VENTRAL HERNIA;  Surgeon: Donnie Mesa, MD;  [facility];  Service: General;  Laterality: N/A;  . lt. distal ureteral stone extraction  09/14/1991  .  PARTIAL COLECTOMY Right 10/14/2016   Procedure: OPEN RIGHT HEMICOLECTOMY;  Surgeon: Donnie Mesa, MD;  Location: [facility];  Service: General;  Laterality: Right;     Family History: Family History  Problem Relation Age of Onset  . Stroke Father 83  . Breast cancer Sister        dx 87's, died at 91  . Seizures Son   . Diabetes Brother   . Kidney disease Brother   . Heart disease Brother   . Epilepsy Son   . Colon cancer Other 82  . Uterine cancer Other 45  . Hodgkin's lymphoma Other     Social History: Social History   Socioeconomic History  . Marital status: Widowed    Spouse name: Not on file  . Number of children: 3  . Years of education: 34  . Highest education level: Some college, no degree  Occupational History  . Occupation: Retired    Comment: Network engineer  Social Needs  . Financial resource strain: Not hard at all  . Food insecurity:    Worry: Never true    Inability: Never true  . Transportation needs:    Medical: No    Non-medical: No  Tobacco Use  . Smoking status: Never Smoker  . Smokeless tobacco: Never Used  Substance and Sexual Activity  . Alcohol use: No  . Drug use: No  . Sexual activity: Not Currently  Lifestyle  . Physical activity:  Days per week: 3 days    Minutes per session: 60 min  . Stress: Only a little  Relationships  . Social connections:    Talks on phone: More than three times a week    Gets together: More than three times a week    Attends religious service: More than 4 times per year    Active member of club or organization: Yes    Attends meetings of clubs or organizations: More than 4 times per year    Relationship status: Widowed  Other Topics Concern  . Not on file  Social History Narrative  . Not on file    Allergies: Allergies  Allergen Reactions  . Benicar [Olmesartan Medoxomil]     Elevated Potassium  . Amoxicillin Rash    Has patient had a PCN reaction causing immediate rash, facial/tongue/throat swelling,  SOB or lightheadedness with hypotension: Yes Has patient had a PCN reaction causing severe rash involving mucus membranes or skin necrosis: No Has patient had a PCN reaction that required hospitalization: No Has patient had a PCN reaction occurring within the last 10 years: No If all of the above answers are "NO", then may proceed with Cephalosporin use.    Outpatient Meds: Current Outpatient Medications  Medication Sig Dispense Refill  . acetaminophen (TYLENOL) 500 MG tablet Take 2 tablets (1,000 mg total) by mouth every 6 (six) hours as needed for moderate pain. 30 tablet 0  . alendronate (FOSAMAX) 70 MG tablet Take 1 tablet (70 mg total) by mouth every 7 (seven) days. Take with a full glass of water on an empty stomach. 12 tablet 3  . cholecalciferol (VITAMIN D) 1000 units tablet Take 1,000 Units by mouth daily.    . diphenoxylate-atropine (LOMOTIL) 2.5-0.025 MG tablet Take 1 tablet 4 (four) times daily as needed by mouth for diarrhea or loose stools. 60 tablet 0  . ferrous sulfate 325 (65 FE) MG tablet TAKE 1 TABLET (325 MG TOTAL) BY MOUTH 2 (TWO) TIMES DAILY WITH A MEAL. 180 tablet 0  . Multiple Vitamins-Minerals (PRESERVISION AREDS 2 PO) Take 1 capsule by mouth 2 (two) times daily.    Marland Kitchen PEG-KCl-NaCl-NaSulf-Na Asc-C (PLENVU) 140 g SOLR Take 140 g by mouth as directed. 1 each 0   Current Facility-Administered Medications  Medication Dose Route Frequency Provider Last Rate Last Dose  . cyanocobalamin ((VITAMIN B-12)) injection 1,000 mcg  1,000 mcg Intramuscular Q30 days Dettinger, [name], MD   1,000 mcg at 10/05/17 [id number]      ___________________________________________________________________ Objective   Exam:  BP 122/72   Pulse 68   Ht 4\' 9"  (1.448 m)   Wt 133 lb (60.3 kg)   BMI 28.78 kg/m    General: this is a(n) well-appearing woman who looks younger than stated age  Eyes: sclera anicteric, no redness  ENT: oral mucosa moist without lesions, no cervical or  supraclavicular lymphadenopathy, good dentition  CV: RRR without murmur, S1/S2, no JVD, no peripheral edema  Resp: clear to auscultation bilaterally, normal RR and effort noted  GI: soft, no tenderness, with active bowel sounds. No guarding or palpable organomegaly noted.  Skin; warm and dry, no rash or jaundice noted  Neuro: awake, alert and oriented x 3. Normal gross motor function and fluent speech  Data  CT abdomen pelvis February 2019 showed no recurrence or metastasis.  I reviewed the most recent oncology office note.  Assessment: Encounter Diagnosis  Name Primary?  . Personal history of colon cancer Yes    Dr.  Burr Medico would like the patient to have a surveillance colonoscopy, and I think that is appropriate.  She is in good health overall and is agreeable to the colonoscopy after a thorough discussion of procedure and risks.  Her last colonoscopy was with Dr. Deatra Ina in 2003.   Thank you for the courtesy of this consult.  Please call me with any questions or concerns.  Nelida Meuse III  CC: Dettinger, [name], MD  Truitt Merle, MD

## 2017-11-05 ENCOUNTER — Ambulatory Visit (INDEPENDENT_AMBULATORY_CARE_PROVIDER_SITE_OTHER): Payer: Medicare Other | Admitting: *Deleted

## 2017-11-05 DIAGNOSIS — E538 Deficiency of other specified B group vitamins: Secondary | ICD-10-CM | POA: Diagnosis not present

## 2017-11-05 NOTE — Progress Notes (Signed)
Pt given Cyanocobalamin inj Tolerated well 

## 2017-11-20 ENCOUNTER — Telehealth: Payer: Self-pay | Admitting: Hematology

## 2017-11-20 NOTE — Telephone Encounter (Signed)
YF PAL 9/20 - moved appointment to 9/24. Spoke with patient. Other appointments remain the same.

## 2017-11-25 ENCOUNTER — Other Ambulatory Visit: Payer: Self-pay | Admitting: Family Medicine

## 2017-11-25 ENCOUNTER — Ambulatory Visit (HOSPITAL_COMMUNITY)
Admission: RE | Admit: 2017-11-25 | Discharge: 2017-11-25 | Disposition: A | Discharge status: Home / Self Care | Payer: Medicare Other | Source: Ambulatory Visit | Attending: Hematology | Admitting: Hematology

## 2017-11-25 ENCOUNTER — Inpatient Hospital Stay: Payer: Medicare Other | Attending: Hematology

## 2017-11-25 ENCOUNTER — Encounter (HOSPITAL_COMMUNITY): Payer: Self-pay

## 2017-11-25 DIAGNOSIS — C189 Malignant neoplasm of colon, unspecified: Secondary | ICD-10-CM | POA: Diagnosis not present

## 2017-11-25 DIAGNOSIS — I10 Essential (primary) hypertension: Secondary | ICD-10-CM | POA: Diagnosis not present

## 2017-11-25 DIAGNOSIS — I700 Atherosclerosis of aorta: Secondary | ICD-10-CM | POA: Insufficient documentation

## 2017-11-25 DIAGNOSIS — M810 Age-related osteoporosis without current pathological fracture: Secondary | ICD-10-CM | POA: Insufficient documentation

## 2017-11-25 DIAGNOSIS — C182 Malignant neoplasm of ascending colon: Secondary | ICD-10-CM | POA: Insufficient documentation

## 2017-11-25 DIAGNOSIS — D500 Iron deficiency anemia secondary to blood loss (chronic): Secondary | ICD-10-CM

## 2017-11-25 DIAGNOSIS — Z1231 Encounter for screening mammogram for malignant neoplasm of breast: Secondary | ICD-10-CM

## 2017-11-25 DIAGNOSIS — Z85038 Personal history of other malignant neoplasm of large intestine: Secondary | ICD-10-CM | POA: Insufficient documentation

## 2017-11-25 DIAGNOSIS — I2510 Atherosclerotic heart disease of native coronary artery without angina pectoris: Secondary | ICD-10-CM | POA: Insufficient documentation

## 2017-11-25 DIAGNOSIS — D509 Iron deficiency anemia, unspecified: Secondary | ICD-10-CM | POA: Insufficient documentation

## 2017-11-25 DIAGNOSIS — C787 Secondary malignant neoplasm of liver and intrahepatic bile duct: Secondary | ICD-10-CM | POA: Diagnosis not present

## 2017-11-25 LAB — COMPREHENSIVE METABOLIC PANEL
ALT: 13 U/L (ref 0–44)
AST: 18 U/L (ref 15–41)
Albumin: 3.9 g/dL (ref 3.5–5.0)
Alkaline Phosphatase: 73 U/L (ref 38–126)
Anion gap: 8 (ref 5–15)
BUN: 12 mg/dL (ref 8–23)
CALCIUM: 9.2 mg/dL (ref 8.9–10.3)
CHLORIDE: 106 mmol/L (ref 98–111)
CO2: 28 mmol/L (ref 22–32)
CREATININE: 0.69 mg/dL (ref 0.44–1.00)
GFR calc Af Amer: >60 mL/min (ref >60)
Glucose, Bld: 89 mg/dL (ref 70–99)
Potassium: 3.4 mmol/L — ABNORMAL LOW (ref 3.5–5.1)
SODIUM: 142 mmol/L (ref 135–145)
Total Bilirubin: 0.9 mg/dL (ref 0.3–1.2)
Total Protein: 6.8 g/dL (ref 6.5–8.1)

## 2017-11-25 LAB — CBC WITH DIFFERENTIAL (CANCER CENTER ONLY)
BASOS PCT: 1 %
Basophils Absolute: 0.1 10*3/uL (ref 0.0–0.1)
EOS ABS: 0.1 10*3/uL (ref 0.0–0.5)
Eosinophils Relative: 1 %
HCT: 41.4 % (ref 34.8–46.6)
HEMOGLOBIN: 13.8 g/dL (ref 11.6–15.9)
Lymphocytes Relative: 22 %
Lymphs Abs: 1.2 10*3/uL (ref 0.9–3.3)
MCH: 29.5 pg (ref 25.1–34.0)
MCHC: 33.3 g/dL (ref 31.5–36.0)
MCV: 88.6 fL (ref 79.5–101.0)
MONOS PCT: 7 %
Monocytes Absolute: 0.4 10*3/uL (ref 0.1–0.9)
NEUTROS PCT: 69 %
Neutro Abs: 3.7 10*3/uL (ref 1.5–6.5)
PLATELETS: 210 10*3/uL (ref 145–400)
RBC: 4.67 MIL/uL (ref 3.70–5.45)
RDW: 13 % (ref 11.2–14.5)
WBC Count: 5.4 10*3/uL (ref 3.9–10.3)

## 2017-11-25 LAB — FERRITIN: Ferritin: 146 ng/mL (ref 11–307)

## 2017-11-25 LAB — CEA (IN HOUSE-CHCC): CEA (CHCC-In House): 2.87 ng/mL (ref 0.00–5.00)

## 2017-11-25 LAB — IRON AND TIBC
IRON: 87 ug/dL (ref 41–142)
Saturation Ratios: 32 % (ref 21–57)
TIBC: 266 ug/dL (ref 236–444)
UIBC: 180 ug/dL

## 2017-11-25 MED ORDER — IOHEXOL 300 MG/ML  SOLN
100.0000 mL | Freq: Once | INTRAMUSCULAR | Status: AC | PRN
  Administered 2017-11-25: 100 mL via INTRAVENOUS

## 2017-11-25 MED ORDER — SODIUM CHLORIDE 0.9 % IJ SOLN
INTRAMUSCULAR | Status: AC
  Filled 2017-11-25: qty 50

## 2017-11-26 ENCOUNTER — Ambulatory Visit (HOSPITAL_COMMUNITY): Payer: Medicare Other

## 2017-11-26 NOTE — Progress Notes (Signed)
[place]  [phone] [phone]  Clinic Follow up Note   Patient Care Team: Dettinger, [name], MD as PCP - General (Family Medicine) Clent Jacks, MD as Consulting Physician (Ophthalmology) Zadie Rhine Clent Demark, MD as Consulting Physician (Ophthalmology) Donnie Mesa, MD as Consulting Physician (General Surgery) Truitt Merle, MD as Consulting Physician (Hematology)   Date of Service:  12/01/2017    CHIEF COMPLAINTS:  Follow up cancer of the right colon   Oncology History   Cancer Staging Cancer of right colon [facility]) Staging form: Colon and Rectum, AJCC 8th Edition - Pathologic stage from 10/14/2016: Stage IIC (pT4b, pN0, cM0) - Signed by Truitt Merle, MD on 11/07/2016       Cancer of right colon ([place])   09/19/2016 Imaging    CT A/P 09/19/16 IMPRESSION: Large 9.5 cm intraluminal mass in the cecum and ascending colon, consistent with colon carcinoma. Tumor extension into adjacent pericolonic fat seen along the lateral wall.  Mild right pericolonic and mesenteric lymphadenopathy, suspicious for metastatic disease. No other sites of metastatic disease identified.  Colonic diverticulosis, without radiographic evidence of diverticulitis. Tiny hiatal hernia, and small epigastric ventral hernia containing only fat.  Aortic atherosclerosis.    10/14/2016 Initial Diagnosis    Cancer of right colon ([place])    10/14/2016 Surgery    OPEN RIGHT HEMICOLECTOMY and REPAIR OF EPIGASTRIC VENTRAL HERNIA by Dr. Georgette Dover and Dr. Dalbert Batman     10/14/2016 Pathology Results    10/14/16 Diagnosis Colon, segmental resection for tumor, Right ADENOCARCINOMA WITH EXTENSIVE EXTRA CELLULAR MUCIN, GRADE 1, (11.0 CM) THE TUMOR INVADES THROUGH THE CECUM WALL INTO ADJACENT TERMINAL ILEUM (PT4B) NINETEEN BENIGN LYMPH NODES (0/19) SUPPURATIVE INFLAMMATION WITH FIBROSIS AND ADHESION TO ABDOMINAL WALL NO ADENOCARCINOMA IDENTIFIED    11/19/2016 Imaging    CT Chest WO Contrast  11/19/16 IMPRESSION: 1. No evidence of metastatic disease. 2. Aortic atherosclerosis (ICD10-170.0). Coronary artery calcification.    11/25/2016 - 03/24/2017 Chemotherapy    Adjuvant Xeloda two weeks on, one week off starting 11/25/16 for 3 months      12/21/2016 Genetic Testing    Patient had genetic testing due to a personal history of colon cancer and a family history of breast, uterine, and colon cancer. The Common Hereditary Cancers Panel was ordered.  The Hereditary Gene Panel offered by [facility] includes sequencing and/or deletion duplication testing of the following 47 genes: APC, ATM, AXIN2, BARD1, BMPR1A, BRCA1, BRCA2, BRIP1, CDH1, CDKN2A (p14ARF), CDKN2A (p16INK4a), CHEK2, CDK4, CTNNA1, DICER1, EPCAM (Deletion/duplication testing only), GREM1 (promoter region deletion/duplication testing only), KIT, MEN1, MLH1, MSH2, MSH3, MSH6, MUTYH, NBN, NF1, NHTL1, PALB2, PDGFRA, PMS2, POLD1, POLE, PTEN, RAD50, RAD51C, RAD51D, SDHB, SDHC, SDHD, SMAD4, SMARCA4. STK11, TP53, TSC1, TSC2, and VHL.  The following genes were evaluated for sequence changes only: SDHA and HOXB13 c.251G>A variant only.  Results: Negative- no pathogenic variants identified.  The date of this test report is 12/21/2016.     04/27/2017 Imaging    CT AP W Contrast 04/27/17 IMPRESSION: Status post right hemicolectomy. No evidence of recurrent or metastatic disease. Additional stable ancillary findings as above.    08/07/2017 Imaging    08/07/2017 DEXA ASSESSMENT: The BMD measured at Forearm Radius 33% is 0.543 g/cm2 with a T-score of -3.9. This patient is considered osteoporotic according to [facility]) criteria.    11/25/2017 Imaging     11/25/2017 CT CAP IMPRESSION: 1. Abnormal appearance of the right lobe of the liver, with development of progressive peripheral intrahepatic duct dilatation  with ill definition of the more central right hepatic duct. This appearance is indeterminate. Considerations include  an incidental primary liver lesion such as cholangiocarcinoma, an otherwise occult liver metastasis with secondary biliary duct dilatation, or infectious/inflammatory cholangitis. If this 84 year old can undergo MRI/MRCP, this should be considered. Multiphase CT (with delayed phase imaging to evaluate for possible cholangiocarcinoma) and/or focused ultrasound would be alternate imaging strategies. 2. Otherwise, no evidence of metastatic disease in the chest, abdomen, or pelvis. 3. Coronary artery atherosclerosis. Aortic Atherosclerosis (ICD10-I70.0).      HISTORY OF PRESENTING ILLNESS:  Ashley Peters 84 y.o. female is here because of cancer of the right colon. She presents with her daughter and husband. Initially, pt presented to her PCP's office on 09/19/16 for a 3mocheck-up and with complaints of worsening right lower quadrant pain that had been ongoing for several weeks. This was accompanied by fatigue, chills, and weight loss of approximately 30lbs over the past year. However, she reports that she was enrolled in a nutrition program for diabetics to help her lose weight (she is only borderline diabetic). Her daughter was able to notice her fatigue as well. She had not noticed any bloody stools, however, she did notice some melena but was on iron supplements for her anemia. She denies any significant constipation with this. She had also noticed a small area of distension over her area of pain in the right lower abdomen which she had not noticed previously. CT A/P was performed the same day which was remarkable for a large 9.5cm intraluminal mass in the cecum and ascending colon, consistent with colon carcinoma. There was tumor extension into the adjacent pericolonic fat seen along the lateral abdominal wall as well. Additionally, mild right pericolonic and mesenteric lymphadenopathy was also seen which was suspicious for metastatic disease. An epigastric ventral hernia was also found to be  present on this scan.   Following these findings, she was subsequently referred to Dr TGeorgette Doverwho performed both an open right hemicolectomy and epigastric ventral hernia repair on 10/14/16. She tolerated this procedure well and was monitored in the hospital until her discharge on 10/17/16, during which she recovered well. She was referred to medical oncology following her surgery and she has been recovering well since d/c. Since her surgery she denies any pain returning to the area. Her bowel movements have been normal and regular since her surgery and her appetite has been well. Her fatigue has also been improving and back to normal.   [place]    CURRENT THERAPY:  surveillance   INTERVAL HISTORY:  Ashley SHANEis here for a follow up. She finished chemotherapy in January 2019. She saw Family Physician Dr. DWarrick Parisianon 09/07/2017 when her most recent DEXA scan results were discussed. She was started on Fosamax, vitamin D and calcium. She was also seen by GI Dr. DLoletha Carrowon 10/30/2017 for surveillance colonoscopy which is scheduled for next month.  Today, she is here with her daughter.  She is doing very well, denies any pain, abdominal discomfort, dark urine, nausea, or other symptoms.  She has good appetite, and decent energy level, she has recovered very well from chemotherapy.  MEDICAL HISTORY:  Past Medical History:  Diagnosis Date  . Anemia   . [place]   . [place] cancer ([place]   . Diverticulosis   . Family history of breast cancer   . Family history of colon cancer   . Family  history of uterine cancer   . GERD (gastroesophageal reflux disease)   . History of kidney stones   . Hyperlipidemia   . Hypertension   . Macular degeneration   . Osteopenia     SURGICAL HISTORY: Past Surgical History:  Procedure Laterality Date  . ABDOMINAL HYSTERECTOMY    . ablation tr Great saphenous vein  10/17/2009  . BREAST EXCISIONAL  BIOPSY Bilateral 1971  . brest biopsy  1971  . CHOLECYSTECTOMY    . EPIGASTRIC HERNIA REPAIR N/A 10/14/2016   Procedure: REPAIR OF EPIGASTRIC VENTRAL HERNIA;  Surgeon: Donnie Mesa, MD;  Location: [place];  Service: General;  Laterality: N/A;  . lt. distal ureteral stone extraction  09/14/1991  . PARTIAL COLECTOMY Right 10/14/2016   Procedure: OPEN RIGHT HEMICOLECTOMY;  Surgeon: Donnie Mesa, MD;  Location: [place];  Service: General;  Laterality: Right;    SOCIAL HISTORY: Social History   Socioeconomic History  . Marital status: Widowed    Spouse name: Not on file  . Number of children: 3  . Years of education: 28  . Highest education level: Some college, no degree  Occupational History  . Occupation: Retired    Comment: Network engineer  Social Needs  . Financial resource strain: Not hard at all  . Food insecurity:    Worry: Never true    Inability: Never true  . Transportation needs:    Medical: No    Non-medical: No  Tobacco Use  . Smoking status: Never Smoker  . Smokeless tobacco: Never Used  Substance and Sexual Activity  . Alcohol use: No  . Drug use: No  . Sexual activity: Not Currently  Lifestyle  . Physical activity:    Days per week: 3 days    Minutes per session: 60 min  . Stress: Only a little  Relationships  . Social connections:    Talks on phone: More than three times a week    Gets together: More than three times a week    Attends religious service: More than 4 times per year    Active member of club or organization: Yes    Attends meetings of clubs or organizations: More than 4 times per year    Relationship status: Widowed  . Intimate partner violence:    Fear of current or ex partner: Not on file    Emotionally abused: Not on file    Physically abused: Not on file    Forced sexual activity: Not on file  Other Topics Concern  . Not on file  Social History Narrative  . Not on file    FAMILY HISTORY: Family History  Problem Relation Age of Onset    . Stroke Father 29  . Breast cancer Sister        dx 13's, died at 74  . Seizures Son   . Diabetes Brother   . Kidney disease Brother   . Heart disease Brother   . Epilepsy Son   . Colon cancer Other 85  . Uterine cancer Other 30  . Hodgkin's lymphoma Other     ALLERGIES:  is allergic to benicar [olmesartan medoxomil] and amoxicillin.  MEDICATIONS:  Current Outpatient Medications  Medication Sig Dispense Refill  . acetaminophen (TYLENOL) 500 MG tablet Take 2 tablets (1,000 mg total) by mouth every 6 (six) hours as needed for moderate pain. 30 tablet 0  . alendronate (FOSAMAX) 70 MG tablet Take 1 tablet (70 mg total) by mouth every 7 (seven) days. Take with a full glass  of water on an empty stomach. 12 tablet 3  . cholecalciferol (VITAMIN D) 1000 units tablet Take 1,000 Units by mouth daily.    . diphenoxylate-atropine (LOMOTIL) 2.5-0.025 MG tablet Take 1 tablet 4 (four) times daily as needed by mouth for diarrhea or loose stools. 60 tablet 0  . ferrous sulfate 325 (65 FE) MG tablet TAKE 1 TABLET (325 MG TOTAL) BY MOUTH 2 (TWO) TIMES DAILY WITH A MEAL. 180 tablet 0  . Multiple Vitamins-Minerals (PRESERVISION AREDS 2 PO) Take 1 capsule by mouth 2 (two) times daily.    Marland Kitchen PEG-KCl-NaCl-NaSulf-Na Asc-C (PLENVU) 140 g SOLR Take 140 g by mouth as directed. 1 each 0  . potassium chloride (K-DUR) 10 MEQ tablet Take 1 tablet (10 mEq total) by mouth 2 (two) times daily. 14 tablet 0   Current Facility-Administered Medications  Medication Dose Route Frequency Provider Last Rate Last Dose  . cyanocobalamin ((VITAMIN B-12)) injection 1,000 mcg  1,000 mcg Intramuscular Q30 days Dettinger, [name], MD   1,000 mcg at 11/05/17 0847    REVIEW OF SYSTEMS:  Constitutional: Denies fevers, chills or abnormal night sweats Good appetite. Eyes: Denies blurriness of vision, double vision or watery eyes Ears, nose, mouth, throat, and face: Denies mucositis or sore throat Respiratory: Denies cough, dyspnea  or wheezes GI: (+) Diarrhea, mild and stable since her colon surgery Cardiovascular: Denies palpitation, chest discomfort Gastrointestinal:  Denies heartburn  (+) hemorrhoids  Skin: Denies abnormal skin rashes  Lymphatics: Denies new lymphadenopathy or easy bruising Neurological:Denies numbness, tingling or new weaknesses Behavioral/Psych: Mood is stable, no new changes  MSK: No myalgias  All other systems were reviewed with the patient and are negative.  PHYSICAL EXAMINATION:  ECOG PERFORMANCE STATUS: 0  Vitals:   12/01/17 1425 12/01/17 1504  BP: (!) 198/72 (!) 166/89  Pulse: 71   Resp: 18   Temp: 98.6 F (37 C)   SpO2: 97%    Filed Weights   12/01/17 1425  Weight: 131 lb 14.4 oz (59.8 kg)     GENERAL:alert, no distress and comfortable SKIN: skin color, texture, turgor are normal, no rashes or significant lesions EYES: normal, conjunctiva are pink and non-injected, sclera clear OROPHARYNX:no exudate, no erythema and lips, buccal mucosa, and tongue normal  NECK: supple, thyroid normal size, non-tender, without nodularity LYMPH:  no palpable lymphadenopathy in the cervical, axillary or inguinal LUNGS: clear to auscultation and percussion with normal breathing effort HEART: regular rhythm and no murmurs No pitting edema. ABDOMEN:abdomen soft, non-tender and normal bowel sounds. Midline surgical incision has healed well. Musculoskeletal:no cyanosis of digits and no clubbing  PSYCH: alert & oriented x 3 with fluent speech NEURO: no focal motor/sensory deficits  LABORATORY DATA:  I have reviewed the data as listed CBC Latest Ref Rng & Units 12/01/2017 11/25/2017 08/27/2017  WBC 3.9 - 10.3 K/uL 6.2 5.4 5.7  Hemoglobin 11.6 - 15.9 g/dL 14.6 13.8 14.5  Hematocrit 34.8 - 46.6 % 43.5 41.4 43.4  Platelets 145 - 400 K/uL 215 210 193   CMP Latest Ref Rng & Units 12/01/2017 11/25/2017 08/27/2017  Glucose 70 - 99 mg/dL 87 89 94  BUN 8 - 23 mg/dL _0 Creatinine 0.44 - 1.00 mg/dL  0.70 0.69 0.69  Sodium 135 - 145 mmol/L 143 142 141  Potassium 3.5 - 5.1 mmol/L 3.1(L) 3.4(L) 3.8  Chloride 98 - 111 mmol/L 106 106 106  CO2 22 - 32 mmol/L _1 Calcium 8.9 - 10.3 mg/dL 10.6(H) 9.2 9.4  Total Protein 6.5 - 8.1 g/dL 6.9 6.8 6.5  Total Bilirubin 0.3 - 1.2 mg/dL 1.0 0.9 0.9  Alkaline Phos 38 - 126 U/L 89 73 81  AST 15 - 41 U/L _0 ALT 0 - 44 U/L _1 Tumor Markers CEA Results for OLINE, BELK (MRN [id number]) as of 11/26/2017 14:01  Ref. Range 11/07/2016 12:55 01/19/2017 07:50 04/06/2017 08:08 08/27/2017 08:14 11/25/2017 13:32  CEA ([facility]) Latest Ref Range: 0.00 - 5.00 ng/mL 2.66 6.88 (H) 2.45 3.38 2.87    PATHOLOGY REPORTS:  12/15/2016 Molecular Pathology Negative result. No Pathogenic sequence variants or deletions/duplications Identified.  10/14/16 Diagnosis Colon, segmental resection for tumor, Right ADENOCARCINOMA WITH EXTENSIVE EXTRA CELLULAR MUCIN, GRADE 1, (11.0 CM) THE TUMOR INVADES THROUGH THE CECUM WALL INTO ADJACENT TERMINAL ILEUM (PT4B) NINETEEN BENIGN LYMPH NODES (0/19) SUPPURATIVE INFLAMMATION WITH FIBROSIS AND ADHESION TO ABDOMINAL WALL NO ADENOCARCINOMA IDENTIFIED Microscopic Comment COLON AND RECTUM (INCLUDING TRANS-ANAL RESECTION): Specimen: Right colon with terminal ileum Procedure: Segmental resection Tumor site: Cecum and terminal ileum Specimen integrity: Intact Macroscopic intactness of mesorectum: Not applicable: X Complete: NA Near complete: NA Incomplete: NA Cannot be determined (specify): NA Macroscopic tumor perforation: Invades adjacent structure Invasive tumor: Maximum size: 11.0 cm Histologic type(s): Adenocarcinoma with extracellular mucin Histologic grade and differentiation: G1: well differentiated/low grade G2: moderately differentiated/low grade G3: poorly differentiated/high grade G4: undifferentiated/high grade Type of polyp in which invasive carcinoma arose: Tubular adenoma Microscopic  extension of invasive tumor: Invades adjacent terminal ileum 1 of 5 Supplemental copy SUPPLEMENTAL for MADINE, SARR ([id number]) Microscopic Comment(continued) Lymph-Vascular invasion: Negative Peri-neural invasion: Negative Tumor deposit(s) (discontinuous extramural extension): Negative Resection margins: Proximal margin: Negative Distal margin: Negative Circumferential (radial) (posterior ascending, posterior descending; lateral and posterior mid-rectum; and entire lower 1/3 rectum):NA Mesenteric margin (sigmoid and transverse): NA Distance closest margin (if all above margins negative): 1.5 cm from abdominal wall adhesion resection Trans-anal resection margins only: Deep margin: NA Mucosal Margin: NA Distance closest mucosal margin (if negative): NA Treatment effect (neo-adjuvant therapy): Negative Additional polyp(s): Negative Non-neoplastic findings: Unremarkable Lymph nodes: number examined 19; number positive: 0 Pathologic Staging: pT4b, pN0, pMx Ancillary studies: Ordered  RADIOGRAPHIC STUDIES: I have personally reviewed the radiological images as listed and agreed with the findings in the report.  11/25/2017 CT CAP IMPRESSION: 1. Abnormal appearance of the right lobe of the liver, with development of progressive peripheral intrahepatic duct dilatation with ill definition of the more central right hepatic duct. This appearance is indeterminate. Considerations include an incidental primary liver lesion such as cholangiocarcinoma, an otherwise occult liver metastasis with secondary biliary duct dilatation, or infectious/inflammatory cholangitis. If this 84 year old can undergo MRI/MRCP, this should be considered. Multiphase CT (with delayed phase imaging to evaluate for possible cholangiocarcinoma) and/or focused ultrasound would be alternate imaging strategies. 2. Otherwise, no evidence of metastatic disease in the chest, abdomen, or pelvis. 3. Coronary artery  atherosclerosis. Aortic Atherosclerosis (ICD10-I70.0).  08/07/2017 DEXA ASSESSMENT: The BMD measured at Forearm Radius 33% is 0.543 g/cm2 with a T-score of -3.9. This patient is considered osteoporotic according to [facility]) criteria.  CT AP W Contrast 04/27/17 IMPRESSION: Status post right hemicolectomy. No evidence of recurrent or metastatic disease. Additional stable ancillary findings as above.   CT Chest WO Contrast 11/19/16 IMPRESSION: 1. No evidence of metastatic disease. 2. Aortic atherosclerosis (ICD10-170.0). Coronary artery calcification.  CT A/P 09/19/16 IMPRESSION: Large 9.5 cm intraluminal mass in the cecum and ascending colon, consistent with colon carcinoma.  Tumor extension into adjacent pericolonic fat seen along the lateral wall. Mild right pericolonic and mesenteric lymphadenopathy, suspicious for metastatic disease. No other sites of metastatic disease identified. Colonic diverticulosis, without radiographic evidence of diverticulitis. Tiny hiatal hernia, and small epigastric ventral hernia containing only fat. Aortic atherosclerosis.  ASSESSMENT:  84 y.o. Caucasian female  1.) Cancer of the right colon into the cecum, invasive adenocarcinoma, G1, pT4bN0Mx stage IIA,   MSI-stable  -I previously discuss with her the surgical pathology results. She was found to have right-sided colon cancer beginning in the cecum. She had an impressively large at 11cm tumor which invaded around the adjacent small intestine which was removed without complication by Dr Georgette Dover. Her surgical margins were negative. All 19 lymph nodes were negative. -I reviewed her staging CT abd/pel scan and chest, which was negative for metastatic disease.  -She had stage II disease, early-stage but with high risk features, because of her T4b tumor  - I discussed her high recurrence risk due to T4b lesion, especially local recurrence  -She agreed to try adjuvant chemotherapy  with Xeloda, had a severe diarrhea after second cycle, eventually recovered. She re-started Xeloda at suggested low dose 1036m BID (1 week on and 1 weeks off) on 02/10/17, tolerating well overall  -She has completed adjuvant Xeloda (a total of 3 months) -Ct AP W Contrast from 04/27/17 reveals no evidence of recurrent or metastatic disease. I discussed results with pt and her family. They are pleased.  -A 11/25/2017 CT CAP revealed abnormal appearance of the right lobe of the liver, with development of progressive peripheral intrahepatic duct dilatation with ill definition of the more central right hepatic duct. This appearance is indeterminate, metastatic disease is less likely but possible.  I recommend her to have a MRI/MRCP for further evaluation.  Patient states she is able to hold her breath during MRI, and agrees to proceed. -Clinically doing very well, asymptomatic, exam was unremarkable.  Labs reveiwed, they are WNL. No clinical concern for recurrence.  -She was also seen by GI Dr. DLoletha Carrowon 10/30/2017 for surveillance colonoscopy scheduled for next months.  -I will call her if her MRCP shows no concerns, and will see her back in 3 months for close f/u    2.) Genetics -Due to her prevalent family history of cancer, I previously suggested that she should follow up with genetics for testing. She would like to have this performed.  -She does have two sons and one daughter, I also informed her that if her genetic testing were to come back with abnormal results that her children should be tested as well.  -Also advised that her children should have routine colonoscopies.  -She completed genetic testing 12/03/16 and her results are negative  3.) Anemia  -Secondary to colon cancer, surgery and iron deficiency. -Labs previously showed a normal iron level, I stopped her oral iron supplement.   4.) Borderline DM and HTN: -She was on BP medication prior to her weight loss, she was taken off of this  following her weight loss.   -Continue with weight loss plan for management and I encouraged her to maintain a good diet to manage her sugars.  -previously advised the patient to monitor her blood pressure at home. Patient blood pressure was 171/77 on 05/04/17 -She will continue f/u w/ her PCP for this.   5. Osteoporosis -08/07/2017 DEXA Scan revealed a T-score of -3.9. She saw Dr. DWarrick Parisian who discussed with results with her on 09/07/2017 and started that patient  on Fosamax, vitamin D and calcium. -Today's labs showed elevated calcium level, I recommend her to decrease oral supplement to 3-4 days a week  - F/u with PCP for tx options    PLAN:  -Abdominal MRI/MRCP with contrast in the next 2 weeks, will call her with the results if no concerns -lab and follow-up in 3 months      Orders Placed This Encounter  Procedures  . MR ABDOMEN WITH MRCP W CONTRAST    Standing Status:   Future    Standing Expiration Date:   02/01/2019    Order Specific Question:   If indicated for the ordered procedure, I authorize the administration of contrast media per Radiology protocol    Answer:   Yes    Order Specific Question:   What is the patient's sedation requirement?    Answer:   No Sedation    Order Specific Question:   Does the patient have a pacemaker or implanted devices?    Answer:   No    Order Specific Question:   Radiology Contrast Protocol - do NOT remove file path    Answer:   [email or web address]    Order Specific Question:   Preferred imaging location?    Answer:   [facility] (table limit-350 lbs)    All questions were answered. The patient knows to call the clinic with any problems, questions or concerns.I spent 20 minutes counseling the patient face to face. The total time spent in the appointment was 25 minutes and more than 50% was on counseling.   Dierdre Searles Dweik am acting as scribe for Dr. Truitt Merle.  I have reviewed the above documentation for  accuracy and completeness, and I agree with the above.    Truitt Merle, MD 12/01/2017

## 2017-11-27 ENCOUNTER — Telehealth: Payer: Self-pay | Admitting: Nurse Practitioner

## 2017-11-27 ENCOUNTER — Ambulatory Visit: Payer: Medicare Other | Admitting: Hematology

## 2017-11-27 ENCOUNTER — Telehealth: Payer: Self-pay | Admitting: Hematology

## 2017-11-27 NOTE — Telephone Encounter (Signed)
I called the patient to f/u on CT findings of new intrahepatic duct dilatation. She feels well, no fever, pain, or juandice. I explained to her I would discuss with her GI Dr. Loletha Carrow and with Dr. Burr Medico when she returns to the office. She has f/u on 9/24 which I urged her to keep. Will add lab before. There is otherwise no definite evidence of cancer recurrence in the chest, abd, or pelvis. She confirms she will be at her f/u on 9/24.  Ashley Rue, NP

## 2017-11-27 NOTE — Telephone Encounter (Signed)
Appt scheduled patient notified per 9/20 sch msg

## 2017-12-01 ENCOUNTER — Telehealth: Payer: Self-pay

## 2017-12-01 ENCOUNTER — Inpatient Hospital Stay: Payer: Medicare Other

## 2017-12-01 ENCOUNTER — Encounter: Payer: Self-pay | Admitting: Hematology

## 2017-12-01 ENCOUNTER — Inpatient Hospital Stay (HOSPITAL_BASED_OUTPATIENT_CLINIC_OR_DEPARTMENT_OTHER): Payer: Medicare Other | Admitting: Hematology

## 2017-12-01 VITALS — BP 166/89 | HR 71 | Temp 98.6°F | Resp 18 | Ht <= 58 in | Wt 131.9 lb

## 2017-12-01 DIAGNOSIS — D509 Iron deficiency anemia, unspecified: Secondary | ICD-10-CM

## 2017-12-01 DIAGNOSIS — M810 Age-related osteoporosis without current pathological fracture: Secondary | ICD-10-CM | POA: Diagnosis not present

## 2017-12-01 DIAGNOSIS — C182 Malignant neoplasm of ascending colon: Secondary | ICD-10-CM

## 2017-12-01 DIAGNOSIS — K831 Obstruction of bile duct: Secondary | ICD-10-CM

## 2017-12-01 DIAGNOSIS — Z85038 Personal history of other malignant neoplasm of large intestine: Secondary | ICD-10-CM

## 2017-12-01 DIAGNOSIS — I10 Essential (primary) hypertension: Secondary | ICD-10-CM | POA: Diagnosis not present

## 2017-12-01 LAB — RETICULOCYTES
RBC.: 4.92 MIL/uL (ref 3.70–5.45)
RETIC COUNT ABSOLUTE: 68.9 10*3/uL (ref 33.7–90.7)
Retic Ct Pct: 1.4 % (ref 0.7–2.1)

## 2017-12-01 LAB — CBC WITH DIFFERENTIAL (CANCER CENTER ONLY)
BASOS ABS: 0.1 10*3/uL (ref 0.0–0.1)
Basophils Relative: 1 %
EOS ABS: 0.1 10*3/uL (ref 0.0–0.5)
EOS PCT: 1 %
HCT: 43.5 % (ref 34.8–46.6)
Hemoglobin: 14.6 g/dL (ref 11.6–15.9)
LYMPHS ABS: 1.6 10*3/uL (ref 0.9–3.3)
LYMPHS PCT: 27 %
MCH: 29.7 pg (ref 25.1–34.0)
MCHC: 33.6 g/dL (ref 31.5–36.0)
MCV: 88.4 fL (ref 79.5–101.0)
MONO ABS: 0.4 10*3/uL (ref 0.1–0.9)
Monocytes Relative: 7 %
Neutro Abs: 4 10*3/uL (ref 1.5–6.5)
Neutrophils Relative %: 64 %
PLATELETS: 215 10*3/uL (ref 145–400)
RBC: 4.92 MIL/uL (ref 3.70–5.45)
RDW: 12.9 % (ref 11.2–14.5)
WBC Count: 6.2 10*3/uL (ref 3.9–10.3)

## 2017-12-01 LAB — COMPREHENSIVE METABOLIC PANEL
ALBUMIN: 4.1 g/dL (ref 3.5–5.0)
ALT: 27 U/L (ref 0–44)
AST: 25 U/L (ref 15–41)
Alkaline Phosphatase: 89 U/L (ref 38–126)
Anion gap: 9 (ref 5–15)
BILIRUBIN TOTAL: 1 mg/dL (ref 0.3–1.2)
BUN: 11 mg/dL (ref 8–23)
CO2: 28 mmol/L (ref 22–32)
CREATININE: 0.7 mg/dL (ref 0.44–1.00)
Calcium: 10.6 mg/dL — ABNORMAL HIGH (ref 8.9–10.3)
Chloride: 106 mmol/L (ref 98–111)
GFR calc Af Amer: >60 mL/min (ref >60)
GFR calc non Af Amer: >60 mL/min (ref >60)
GLUCOSE: 87 mg/dL (ref 70–99)
POTASSIUM: 3.1 mmol/L — AB (ref 3.5–5.1)
Sodium: 143 mmol/L (ref 135–145)
TOTAL PROTEIN: 6.9 g/dL (ref 6.5–8.1)

## 2017-12-01 MED ORDER — POTASSIUM CHLORIDE ER 10 MEQ PO TBCR: 10.0000 meq | EXTENDED_RELEASE_TABLET | Freq: Two times a day (BID) | ORAL | 0 refills | Status: DC

## 2017-12-01 NOTE — Telephone Encounter (Signed)
PRINTED AVS AND CALENDER OF UPCOMING APPOINTMENT. PER 9/24 LOS

## 2017-12-07 ENCOUNTER — Ambulatory Visit (INDEPENDENT_AMBULATORY_CARE_PROVIDER_SITE_OTHER): Payer: Medicare Other | Admitting: *Deleted

## 2017-12-07 DIAGNOSIS — Z23 Encounter for immunization: Secondary | ICD-10-CM

## 2017-12-07 DIAGNOSIS — E538 Deficiency of other specified B group vitamins: Secondary | ICD-10-CM | POA: Diagnosis not present

## 2017-12-07 NOTE — Progress Notes (Signed)
Pt given Cyanocobalamin inj Tolerated well 

## 2017-12-10 ENCOUNTER — Other Ambulatory Visit: Payer: Self-pay | Admitting: Hematology

## 2017-12-10 ENCOUNTER — Ambulatory Visit (HOSPITAL_COMMUNITY)
Admission: RE | Admit: 2017-12-10 | Discharge: 2017-12-10 | Disposition: A | Discharge status: Home / Self Care | Payer: Medicare Other | Source: Ambulatory Visit | Attending: Hematology | Admitting: Hematology

## 2017-12-10 DIAGNOSIS — K831 Obstruction of bile duct: Secondary | ICD-10-CM

## 2017-12-10 DIAGNOSIS — Z9049 Acquired absence of other specified parts of digestive tract: Secondary | ICD-10-CM | POA: Insufficient documentation

## 2017-12-10 DIAGNOSIS — R935 Abnormal findings on diagnostic imaging of other abdominal regions, including retroperitoneum: Secondary | ICD-10-CM | POA: Diagnosis not present

## 2017-12-10 MED ORDER — GADOBUTROL 1 MMOL/ML IV SOLN
6.0000 mL | Freq: Once | INTRAVENOUS | Status: AC | PRN
  Administered 2017-12-10: 6 mL via INTRAVENOUS

## 2017-12-16 ENCOUNTER — Telehealth: Payer: Self-pay | Admitting: Hematology

## 2017-12-16 NOTE — Telephone Encounter (Signed)
I called pt and discussed her MRI result. I reviewed her MRI with GI Dr. Ardis Hughs this morning. We recommend her to have lab in 4 weeks, and a repeated CT abd/pel before her next office visit in late Dec. She voiced understanding and agrees with the plan. I call update her daughter also tomoorrow.  Truitt Merle  12/16/2017

## 2017-12-23 ENCOUNTER — Ambulatory Visit (AMBULATORY_SURGERY_CENTER): Payer: Medicare Other | Admitting: Gastroenterology

## 2017-12-23 ENCOUNTER — Encounter: Payer: Self-pay | Admitting: Gastroenterology

## 2017-12-23 VITALS — BP 129/64 | HR 72 | Temp 98.0°F | Resp 18 | Ht <= 58 in | Wt 133.0 lb

## 2017-12-23 DIAGNOSIS — Z85038 Personal history of other malignant neoplasm of large intestine: Secondary | ICD-10-CM | POA: Diagnosis not present

## 2017-12-23 DIAGNOSIS — I10 Essential (primary) hypertension: Secondary | ICD-10-CM | POA: Diagnosis not present

## 2017-12-23 DIAGNOSIS — D124 Benign neoplasm of descending colon: Secondary | ICD-10-CM | POA: Diagnosis not present

## 2017-12-23 DIAGNOSIS — K635 Polyp of colon: Secondary | ICD-10-CM | POA: Diagnosis not present

## 2017-12-23 DIAGNOSIS — D128 Benign neoplasm of rectum: Secondary | ICD-10-CM

## 2017-12-23 MED ORDER — SODIUM CHLORIDE 0.9 % IV SOLN: 500.0000 mL | Freq: Once | INTRAVENOUS | Status: DC

## 2017-12-23 NOTE — Patient Instructions (Signed)
YOU HAD AN ENDOSCOPIC PROCEDURE TODAY AT [facility]:   Refer to the procedure report that was given to you for any specific questions about what was found during the examination.  If the procedure report does not answer your questions, please call your gastroenterologist to clarify.  If you requested that your care partner not be given the details of your procedure findings, then the procedure report has been included in a sealed envelope for you to review at your convenience later.  YOU SHOULD EXPECT: Some feelings of bloating in the abdomen. Passage of more gas than usual.  Walking can help get rid of the air that was put into your GI tract during the procedure and reduce the bloating. If you had a lower endoscopy (such as a colonoscopy or flexible sigmoidoscopy) you may notice spotting of blood in your stool or on the toilet paper. If you underwent a bowel prep for your procedure, you may not have a normal bowel movement for a few days.  Please Note:  You might notice some irritation and congestion in your nose or some drainage.  This is from the oxygen used during your procedure.  There is no need for concern and it should clear up in a day or so.  SYMPTOMS TO REPORT IMMEDIATELY:   Following lower endoscopy (colonoscopy or flexible sigmoidoscopy):  Excessive amounts of blood in the stool  Significant tenderness or worsening of abdominal pains  Swelling of the abdomen that is new, acute  Fever of 100F or higher  Please see handouts given to you on Polyps and diverticulosis.  For urgent or emergent issues, a gastroenterologist can be reached at any hour by calling [phone].   DIET:  We do recommend a small meal at first, but then you may proceed to your regular diet.  Drink plenty of fluids but you should avoid alcoholic beverages for 24 hours.  ACTIVITY:  You should plan to take it easy for the rest of today and you should NOT DRIVE or use heavy machinery until  tomorrow (because of the sedation medicines used during the test).    FOLLOW UP: Our staff will call the number listed on your records the next business day following your procedure to check on you and address any questions or concerns that you may have regarding the information given to you following your procedure. If we do not reach you, we will leave a message.  However, if you are feeling well and you are not experiencing any problems, there is no need to return our call.  We will assume that you have returned to your regular daily activities without incident.  If any biopsies were taken you will be contacted by phone or by letter within the next 1-3 weeks.  Please call us at [phone] if you have not heard about the biopsies in 3 weeks.    SIGNATURES/CONFIDENTIALITY: You and/or your care partner have signed paperwork which will be entered into your electronic medical record.  These signatures attest to the fact that that the information above on your After Visit Summary has been reviewed and is understood.  Full responsibility of the confidentiality of this discharge information lies with you and/or your care-partner.  Thank you for letting us take care of your healthcare needs today.

## 2017-12-23 NOTE — Op Note (Signed)
[place] Patient Name: Ashley Peters Procedure Date: 12/23/2017 12:03 PM MRN: [id number] Endoscopist: [place]. Loletha Carrow , MD Age: 84 Referring MD:  Date of Birth: Nov 02, 1928 Gender: Female Account #: [id number] Procedure:                Colonoscopy Indications:              High risk colon cancer surveillance: Personal                            history of colon cancer (diagnosed 09/2016 and                            treated with right hemicolectomy and Xeloda) - this                            is the first exam since surgery Medicines:                Monitored Anesthesia Care Procedure:                Pre-Anesthesia Assessment:                           - Prior to the procedure, a History and Physical                            was performed, and patient medications and                            allergies were reviewed. The patient's tolerance of                            previous anesthesia was also reviewed. The risks                            and benefits of the procedure and the sedation                            options and risks were discussed with the patient.                            All questions were answered, and informed consent                            was obtained. Prior Anticoagulants: The patient has                            taken no previous anticoagulant or antiplatelet                            agents. ASA Grade Assessment: II - A patient with                            mild systemic disease. After reviewing the risks  and benefits, the patient was deemed in                            satisfactory condition to undergo the procedure.                           After obtaining informed consent, the colonoscope                            was passed under direct vision. Throughout the                            procedure, the patient's blood pressure, pulse, and                            oxygen saturations were monitored  continuously. The                            Colonoscope was introduced through the anus and                            advanced to the the ileocolonic anastomosis. The                            colonoscopy was performed without difficulty. The                            patient tolerated the procedure well. The quality                            of the bowel preparation was good. The rectum and                            ileocolonic anastomosis were photographed. Scope In: 12:09:59 PM Scope Out: 12:25:19 PM Scope Withdrawal Time: 0 hours 12 minutes 6 seconds  Total Procedure Duration: 0 hours 15 minutes 20 seconds  Findings:                 The digital rectal exam findings include decreased                            sphincter tone.                           Two sessile polyps were found in the rectum and                            descending colon. The polyps were 3 mm in size.                            These polyps were removed with a cold snare.                            Resection and retrieval were complete.  A 8 mm polyp was found in the rectum. The polyp was                            semi-pedunculated. The polyp was removed with a hot                            snare. Resection and retrieval were complete.                           Diverticula were found in the entire colon.                           The exam was otherwise without abnormality on                            direct and retroflexion views. Complications:            No immediate complications. Estimated Blood Loss:     Estimated blood loss was minimal. Impression:               - Decreased sphincter tone found on digital rectal                            exam.                           - Two 3 mm polyps in the rectum and in the                            descending colon, removed with a cold snare.                            Resected and retrieved.                           - One 8 mm  polyp in the rectum, removed with a hot                            snare. Resected and retrieved.                           - Diverticulosis in the entire examined colon.                           - The examination was otherwise normal on direct                            and retroflexion views. Recommendation:           - Patient has a contact number available for                            emergencies. The signs and symptoms of potential  delayed complications were discussed with the                            patient. Return to normal activities tomorrow.                            Written discharge instructions were provided to the                            patient.                           - Resume previous diet.                           - Continue present medications.                           - Await pathology results.                           - No repeat surveillance colonoscopy due to age. Shanese Riemenschneider L. Loletha Carrow, MD 12/23/2017 12:34:01 PM This report has been signed electronically.

## 2017-12-23 NOTE — Progress Notes (Signed)
To recovery, report to RN, VSS. 

## 2017-12-23 NOTE — Progress Notes (Signed)
Called to room to assist during endoscopic procedure.  Patient ID and intended procedure confirmed with present staff. Received instructions for my participation in the procedure from the performing physician.  

## 2017-12-24 ENCOUNTER — Telehealth: Payer: Self-pay

## 2017-12-24 NOTE — Telephone Encounter (Signed)
Called [phone] and left a messaged we tried to reach pt for a follow up call. maw

## 2017-12-24 NOTE — Telephone Encounter (Signed)
  Follow up Call-  Call Crosley Stejskal number 12/23/2017  Post procedure Call Enya Bureau phone  # [phone]  Permission to leave phone message Yes  Some recent data might be hidden     Patient questions:  Do you have a fever, pain , or abdominal swelling? No. Pain Score  0 *  Have you tolerated food without any problems? Yes.    Have you been able to return to your normal activities? Yes.    Do you have any questions about your discharge instructions: Diet   No. Medications  No. Follow up visit  No.  Do you have questions or concerns about your Care? No.  Actions: * If pain score is 4 or above: No action needed, pain <4.

## 2017-12-28 ENCOUNTER — Ambulatory Visit
Admission: RE | Admit: 2017-12-28 | Discharge: 2017-12-28 | Disposition: A | Discharge status: Home / Self Care | Payer: Medicare Other | Source: Ambulatory Visit | Attending: Family Medicine | Admitting: Family Medicine

## 2017-12-28 DIAGNOSIS — Z1231 Encounter for screening mammogram for malignant neoplasm of breast: Secondary | ICD-10-CM

## 2017-12-31 ENCOUNTER — Encounter: Payer: Self-pay | Admitting: Gastroenterology

## 2018-01-06 ENCOUNTER — Ambulatory Visit (INDEPENDENT_AMBULATORY_CARE_PROVIDER_SITE_OTHER): Payer: Medicare Other | Admitting: *Deleted

## 2018-01-06 DIAGNOSIS — E538 Deficiency of other specified B group vitamins: Secondary | ICD-10-CM | POA: Diagnosis not present

## 2018-01-06 NOTE — Progress Notes (Signed)
Tolerated B12 injection well

## 2018-01-19 ENCOUNTER — Other Ambulatory Visit: Payer: Self-pay | Admitting: Family Medicine

## 2018-02-08 ENCOUNTER — Ambulatory Visit (INDEPENDENT_AMBULATORY_CARE_PROVIDER_SITE_OTHER): Payer: Medicare Other | Admitting: *Deleted

## 2018-02-08 DIAGNOSIS — E538 Deficiency of other specified B group vitamins: Secondary | ICD-10-CM | POA: Diagnosis not present

## 2018-02-08 NOTE — Patient Instructions (Signed)
Cyanocobalamin, Vitamin B12 injection What is this medicine? CYANOCOBALAMIN (sye an oh koe BAL a min) is a man made form of vitamin B12. Vitamin B12 is used in the growth of healthy blood cells, nerve cells, and proteins in the body. It also helps with the metabolism of fats and carbohydrates. This medicine is used to treat people who can not absorb vitamin B12. This medicine may be used for other purposes; ask your health care provider or pharmacist if you have questions. COMMON BRAND NAME(S): B-12 Compliance Kit, B-12 Injection Kit, Cyomin, LA-12, Nutri-Twelve, Physicians EZ Use B-12, Primabalt What should I tell my health care provider before I take this medicine? They need to know if you have any of these conditions: -kidney disease -Leber's disease -megaloblastic anemia -an unusual or allergic reaction to cyanocobalamin, cobalt, other medicines, foods, dyes, or preservatives -pregnant or trying to get pregnant -breast-feeding How should I use this medicine? This medicine is injected into a muscle or deeply under the skin. It is usually given by a health care professional in a clinic or doctor's office. However, your doctor may teach you how to inject yourself. Follow all instructions. Talk to your pediatrician regarding the use of this medicine in children. Special care may be needed. Overdosage: If you think you have taken too much of this medicine contact a poison control center or emergency room at once. NOTE: This medicine is only for you. Do not share this medicine with others. What if I miss a dose? If you are given your dose at a clinic or doctor's office, call to reschedule your appointment. If you give your own injections and you miss a dose, take it as soon as you can. If it is almost time for your next dose, take only that dose. Do not take double or extra doses. What may interact with this medicine? -colchicine -heavy alcohol intake This list may not describe all possible  interactions. Give your health care provider a list of all the medicines, herbs, non-prescription drugs, or dietary supplements you use. Also tell them if you smoke, drink alcohol, or use illegal drugs. Some items may interact with your medicine. What should I watch for while using this medicine? Visit your doctor or health care professional regularly. You may need blood work done while you are taking this medicine. You may need to follow a special diet. Talk to your doctor. Limit your alcohol intake and avoid smoking to get the best benefit. What side effects may I notice from receiving this medicine? Side effects that you should report to your doctor or health care professional as soon as possible: -allergic reactions like skin rash, itching or hives, swelling of the face, lips, or tongue -blue tint to skin -chest tightness, pain -difficulty breathing, wheezing -dizziness -red, swollen painful area on the leg Side effects that usually do not require medical attention (report to your doctor or health care professional if they continue or are bothersome): -diarrhea -headache This list may not describe all possible side effects. Call your doctor for medical advice about side effects. You may report side effects to FDA at 1-800-FDA-1088. Where should I keep my medicine? Keep out of the reach of children. Store at room temperature between 15 and 30 degrees C (59 and 85 degrees F). Protect from light. Throw away any unused medicine after the expiration date. NOTE: This sheet is a summary. It may not cover all possible information. If you have questions about this medicine, talk to your doctor, pharmacist, or   health care provider.  2018 Elsevier/Gold Standard (2007-06-07 22:10:20)  

## 2018-02-08 NOTE — Progress Notes (Signed)
Vitamin b12 injection given and tolerated well.  

## 2018-03-01 NOTE — Progress Notes (Signed)
[place]   [phone] Fax:([phone]) [phone]   Clinic Follow up Note   Patient Care Team: Dettinger, [name], MD as PCP - General (Family Medicine) Clent Jacks, MD as Consulting Physician (Ophthalmology) Zadie Rhine Clent Demark, MD as Consulting Physician (Ophthalmology) Donnie Mesa, MD as Consulting Physician (General Surgery) Truitt Merle, MD as Consulting Physician (Hematology) 03/04/2018  CHIEF COMPLAINT: F/u on right colon cancer   SUMMARY OF ONCOLOGIC HISTORY: Oncology History   Cancer Staging Cancer of right colon [facility]) Staging form: Colon and Rectum, AJCC 8th Edition - Pathologic stage from 10/14/2016: Stage IIC (pT4b, pN0, cM0) - Signed by Truitt Merle, MD on 11/07/2016       Cancer of right colon ([place])   09/19/2016 Imaging    CT A/P 09/19/16 IMPRESSION: Large 9.5 cm intraluminal mass in the cecum and ascending colon, consistent with colon carcinoma. Tumor extension into adjacent pericolonic fat seen along the lateral wall.  Mild right pericolonic and mesenteric lymphadenopathy, suspicious for metastatic disease. No other sites of metastatic disease identified.  Colonic diverticulosis, without radiographic evidence of diverticulitis. Tiny hiatal hernia, and small epigastric ventral hernia containing only fat.  Aortic atherosclerosis.    10/14/2016 Initial Diagnosis    Cancer of right colon ([place])    10/14/2016 Surgery    OPEN RIGHT HEMICOLECTOMY and REPAIR OF EPIGASTRIC VENTRAL HERNIA by Dr. Georgette Dover and Dr. Dalbert Batman     10/14/2016 Pathology Results    10/14/16 Diagnosis Colon, segmental resection for tumor, Right ADENOCARCINOMA WITH EXTENSIVE EXTRA CELLULAR MUCIN, GRADE 1, (11.0 CM) THE TUMOR INVADES THROUGH THE CECUM WALL INTO ADJACENT TERMINAL ILEUM (PT4B) NINETEEN BENIGN LYMPH NODES (0/19) SUPPURATIVE INFLAMMATION WITH FIBROSIS AND ADHESION TO ABDOMINAL WALL NO ADENOCARCINOMA IDENTIFIED    11/19/2016 Imaging    CT Chest WO Contrast  11/19/16 IMPRESSION: 1. No evidence of metastatic disease. 2. Aortic atherosclerosis (ICD10-170.0). Coronary artery calcification.    11/25/2016 - 03/24/2017 Chemotherapy    Adjuvant Xeloda two weeks on, one week off starting 11/25/16 for 3 months      12/21/2016 Genetic Testing    Patient had genetic testing due to a personal history of colon cancer and a family history of breast, uterine, and colon cancer. The Common Hereditary Cancers Panel was ordered.  The Hereditary Gene Panel offered by [facility] includes sequencing and/or deletion duplication testing of the following 47 genes: APC, ATM, AXIN2, BARD1, BMPR1A, BRCA1, BRCA2, BRIP1, CDH1, CDKN2A (p14ARF), CDKN2A (p16INK4a), CHEK2, CDK4, CTNNA1, DICER1, EPCAM (Deletion/duplication testing only), GREM1 (promoter region deletion/duplication testing only), KIT, MEN1, MLH1, MSH2, MSH3, MSH6, MUTYH, NBN, NF1, NHTL1, PALB2, PDGFRA, PMS2, POLD1, POLE, PTEN, RAD50, RAD51C, RAD51D, SDHB, SDHC, SDHD, SMAD4, SMARCA4. STK11, TP53, TSC1, TSC2, and VHL.  The following genes were evaluated for sequence changes only: SDHA and HOXB13 c.251G>A variant only.  Results: Negative- no pathogenic variants identified.  The date of this test report is 12/21/2016.     04/27/2017 Imaging    CT AP W Contrast 04/27/17 IMPRESSION: Status post right hemicolectomy. No evidence of recurrent or metastatic disease. Additional stable ancillary findings as above.    08/07/2017 Imaging    08/07/2017 DEXA ASSESSMENT: The BMD measured at Forearm Radius 33% is 0.543 g/cm2 with a T-score of -3.9. This patient is considered osteoporotic according to [facility]) criteria.    11/25/2017 Imaging     11/25/2017 CT CAP IMPRESSION: 1. Abnormal appearance of the right lobe of the liver, with development of progressive peripheral intrahepatic duct dilatation with ill definition of the  more central right hepatic duct. This appearance is indeterminate. Considerations include  an incidental primary liver lesion such as cholangiocarcinoma, an otherwise occult liver metastasis with secondary biliary duct dilatation, or infectious/inflammatory cholangitis. If this 84 year old can undergo MRI/MRCP, this should be considered. Multiphase CT (with delayed phase imaging to evaluate for possible cholangiocarcinoma) and/or focused ultrasound would be alternate imaging strategies. 2. Otherwise, no evidence of metastatic disease in the chest, abdomen, or pelvis. 3. Coronary artery atherosclerosis. Aortic Atherosclerosis (ICD10-I70.0).    12/10/2017 Imaging    12/10/2017 MRI Abdomen IMPRESSION: 1. Stricturing of the central bile ducts of segment V RIGHT hepatic lobe without clear lesion identified. Differential would include benign and malignant stricturing including cholangiocarcinoma. Evaluation is hampered by clip artifact from cholecystectomy clips and duodenum gas. FDG PET scan may or may not be helpful in identify lesion. ERCP would be a second option to evaluate this central obstruction. 2. No enhancing lesion the in the liver parenchyma parenchyma. Altered perfusion related to the biliary obstruction.    12/23/2017 Procedure    12/23/2017 Colonoscopy Impressions: - Decreased sphincter tone found on digital rectal exam. - Two 3 mm polyps in the rectum and in the descending colon, removed with a cold snare. Resected and retrieved. - One 8 mm polyp in the rectum, removed with a hot snare. Resected and retrieved. - Diverticulosis in the entire examined colon. - The examination was otherwise normal on direct and retroflexion views.     CURRENT THERAPY Surveillance   INTERVAL HISTORY: Ashley Peters is a 84 y.o. female who is here for follow-up. She had a colonoscopy on 12/23/2017.  Today, she is here with her family member. She has gained 3 Ibs. She is doing well and denies pain or appetite changes. She urinated frequently and has one BM a day. She drinks  enough fluids.    Pertinent positives and negatives of review of systems are listed and detailed within the above HPI.  REVIEW OF SYSTEMS:   Constitutional: Denies fevers, chills or abnormal weight loss (+) weight gain  Eyes: Denies blurriness of vision Ears, nose, mouth, throat, and face: Denies mucositis or sore throat Respiratory: Denies cough, dyspnea or wheezes Cardiovascular: Denies palpitation, chest discomfort or lower extremity swelling Gastrointestinal:  Denies nausea, heartburn or change in bowel habits Skin: Denies abnormal skin rashes Lymphatics: Denies new lymphadenopathy or easy bruising Neurological:Denies numbness, tingling or new weaknesses Behavioral/Psych: Mood is stable, no new changes  All other systems were reviewed with the patient and are negative.  MEDICAL HISTORY:  Past Medical History:  Diagnosis Date  . Anemia   . Blood transfusion without reported diagnosis   . Cancer ([place])   . Colon cancer ([place])   . Diverticulosis   . Family history of breast cancer   . Family history of colon cancer   . Family history of uterine cancer   . GERD (gastroesophageal reflux disease)   . History of kidney stones   . Hyperlipidemia   . Hypertension   . Macular degeneration   . Osteopenia     SURGICAL HISTORY: Past Surgical History:  Procedure Laterality Date  . ABDOMINAL HYSTERECTOMY    . ablation tr Great saphenous vein  10/17/2009  . BREAST EXCISIONAL BIOPSY Bilateral 1971  . brest biopsy  1971  . CHOLECYSTECTOMY    . EPIGASTRIC HERNIA REPAIR N/A 10/14/2016   Procedure: REPAIR OF EPIGASTRIC VENTRAL HERNIA;  Surgeon: Donnie Mesa, MD;  Location: [place];  Service: General;  Laterality: N/A;  . lt.  distal ureteral stone extraction  09/14/1991  . PARTIAL COLECTOMY Right 10/14/2016   Procedure: OPEN RIGHT HEMICOLECTOMY;  Surgeon: Donnie Mesa, MD;  Location: [place];  Service: General;  Laterality: Right;    I have reviewed the social history and family history  with the patient and they are unchanged from previous note.  ALLERGIES:  is allergic to benicar [olmesartan medoxomil] and amoxicillin.  MEDICATIONS:  Current Outpatient Medications  Medication Sig Dispense Refill  . acetaminophen (TYLENOL) 500 MG tablet Take 2 tablets (1,000 mg total) by mouth every 6 (six) hours as needed for moderate pain. 30 tablet 0  . alendronate (FOSAMAX) 70 MG tablet Take 1 tablet (70 mg total) by mouth every 7 (seven) days. Take with a full glass of water on an empty stomach. 12 tablet 3  . cholecalciferol (VITAMIN D) 1000 units tablet Take 1,000 Units by mouth daily.    . diphenoxylate-atropine (LOMOTIL) 2.5-0.025 MG tablet Take 1 tablet 4 (four) times daily as needed by mouth for diarrhea or loose stools. 60 tablet 0  . ferrous sulfate 325 (65 FE) MG tablet Take 1 tablet (325 mg total) by mouth 2 (two) times daily with a meal. (Needs to be seen before next refill) 60 tablet 0  . Multiple Vitamins-Minerals (PRESERVISION AREDS 2 PO) Take 1 capsule by mouth 2 (two) times daily.     Current Facility-Administered Medications  Medication Dose Route Frequency Provider Last Rate Last Dose  . cyanocobalamin ((VITAMIN B-12)) injection 1,000 mcg  1,000 mcg Intramuscular Q30 days Dettinger, [name], MD   1,000 mcg at 02/08/18 0850    PHYSICAL EXAMINATION: ECOG PERFORMANCE STATUS: 1 - Symptomatic but completely ambulatory  Vitals:   03/04/18 0824  BP: (!) 169/83  Pulse: 78  Resp: 18  Temp: (!) 96.8 F (36 C)  SpO2: 100%   Filed Weights   03/04/18 0824  Weight: 136 lb 3.2 oz (61.8 kg)    GENERAL:alert, no distress and comfortable SKIN: skin color, texture, turgor are normal, no rashes or significant lesions EYES: normal, Conjunctiva are pink and non-injected, sclera clear OROPHARYNX:no exudate, no erythema and lips, buccal mucosa, and tongue normal  NECK: supple, thyroid normal size, non-tender, without nodularity LYMPH:  no palpable lymphadenopathy in the  cervical, axillary or inguinal LUNGS: clear to auscultation and percussion with normal breathing effort HEART: regular rate & rhythm and no murmurs and no lower extremity edema ABDOMEN:abdomen soft, non-tender and normal bowel sounds. No organomegaly.  Musculoskeletal:no cyanosis of digits and no clubbing  NEURO: alert & oriented x 3 with fluent speech, no focal motor/sensory deficits  LABORATORY DATA:  I have reviewed the data as listed CBC Latest Ref Rng & Units 03/04/2018 12/01/2017 11/25/2017  WBC 4.0 - 10.5 K/uL 6.0 6.2 5.4  Hemoglobin 12.0 - 15.0 g/dL 14.2 14.6 13.8  Hematocrit 36.0 - 46.0 % 43.2 43.5 41.4  Platelets 150 - 400 K/uL 192 215 210     CMP Latest Ref Rng & Units 03/04/2018 12/01/2017 11/25/2017  Glucose 70 - 99 mg/dL 96 87 89  BUN 8 - 23 mg/dL '19 11 12  ' Creatinine 0.44 - 1.00 mg/dL 0.75 0.70 0.69  Sodium 135 - 145 mmol/L 142 143 142  Potassium 3.5 - 5.1 mmol/L 4.0 3.1(L) 3.4(L)  Chloride 98 - 111 mmol/L 108 106 106  CO2 22 - 32 mmol/L '26 28 28  ' Calcium 8.9 - 10.3 mg/dL 9.3 10.6(H) 9.2  Total Protein 6.5 - 8.1 g/dL 6.7 6.9 6.8  Total Bilirubin 0.3 -  1.2 mg/dL 0.9 1.0 0.9  Alkaline Phos 38 - 126 U/L 67 89 73  AST 15 - 41 U/L '17 25 18  ' ALT 0 - 44 U/L '16 27 13   ' PROCEDURES  12/23/2017 Colonoscopy Findings: - The digital rectal exam findings include decreased sphincter tone. - Two sessile polyps were found in the rectum and descending colon. The polyps were 3 mm in size. These polyps were removed with a cold snare. Resection and retrieval were complete. - A 8 mm polyp was found in the rectum. The polyp was semi-pedunculated. The polyp was removed with a hot snare. Resection and retrieval were complete. - Diverticula were found in the entire colon. - The exam was otherwise without abnormality on direct and retroflexion views.  Impressions: - Decreased sphincter tone found on digital rectal exam. - Two 3 mm polyps in the rectum and in the descending colon,  removed with a cold snare. Resected and retrieved. - One 8 mm polyp in the rectum, removed with a hot snare. Resected and retrieved. - Diverticulosis in the entire examined colon. - The examination was otherwise normal on direct and retroflexion views.  RADIOGRAPHIC STUDIES: I have personally reviewed the radiological images as listed and agreed with the findings in the report. No results found.   12/10/2017 MRI Abdomen IMPRESSION: 1. Stricturing of the central bile ducts of segment V RIGHT hepatic lobe without clear lesion identified. Differential would include benign and malignant stricturing including cholangiocarcinoma. Evaluation is hampered by clip artifact from cholecystectomy clips and duodenum gas. FDG PET scan may or may not be helpful in identify lesion. ERCP would be a second option to evaluate this central obstruction. 2. No enhancing lesion the in the liver parenchyma parenchyma. Altered perfusion related to the biliary obstruction.   ASSESSMENT & PLAN:  Ashley Peters is a 84 y.o. female with history of  1. Cancer of the right colon, invasive adenocarcinoma, G1, pT4bN0Mx stage IIA, MSI-stable  -Diagnosed in 09/2016. Genetic testing was negative. Treated with right hemicolectomy and adjuvant Xeloda. Currently on surveillance.  -Her last surveillance CT scan in September 2019 showed peripheral intrahepatic ductal dilatation, no definitive liver metastasis.  MR abdomen with MRCP was further obtained, which showed stricturing of the central bile duct of segment 5 right hepatic lobe, without clear lesion identified. I reviewed and discussed her MRI and called her with results on 12/16/2017. We reviewed this again today. I recommend monitoring her liver function and symptoms for now, due to her advanced age and clinically asymptomatic, no invasive procedure is recommended at this point. Plan to repeat CT scan on next visit  -She had a colonoscopy on 12/23/2017 with a few polyps  removed.  Due to her advanced age, no further surveillance colonoscopy was recommended.  -Labs reviewed, CBC is WNLs. CMP and iron studies pending.  -She is clinically doing well, exam was unremarkable, no clinical concern for recurrence -Continue colon cancer surveillance.  2. Anemia  -She currently received B12 injections. Last injection was on 02/08/2018. Tolerating well. -Labs reviewed, CBC showed Hg 14.2. Iron studies pending.   3. Borderline DM and HTN -f/u with PCP and continue recommended medications  4. Osteoporosis -Currently on Fosamax, vitamin D and calcium. Continue. -Last DEXA was on 2019 and showed T score of -3.9. -f/u with PCP   Plan  -Continue surveillance. -f/u in 4 months with labs and a CT abd/pel scan  -Will call with rest of lab results   No problem-specific Assessment & Plan notes found  for this encounter.   No orders of the defined types were placed in this encounter.  All questions were answered. The patient knows to call the clinic with any problems, questions or concerns. No barriers to learning was detected. I spent 20 minutes counseling the patient face to face. The total time spent in the appointment was 25 minutes and more than 50% was on counseling and review of test results  I, Noor Dweik am acting as scribe for Dr. Truitt Merle.  I have reviewed the above documentation for accuracy and completeness, and I agree with the above.     Truitt Merle, MD 03/04/2018

## 2018-03-04 ENCOUNTER — Telehealth: Payer: Self-pay | Admitting: Hematology

## 2018-03-04 ENCOUNTER — Inpatient Hospital Stay: Payer: Medicare Other | Attending: Hematology

## 2018-03-04 ENCOUNTER — Encounter: Payer: Self-pay | Admitting: Hematology

## 2018-03-04 ENCOUNTER — Inpatient Hospital Stay (HOSPITAL_BASED_OUTPATIENT_CLINIC_OR_DEPARTMENT_OTHER): Payer: Medicare Other | Admitting: Hematology

## 2018-03-04 VITALS — BP 169/83 | HR 78 | Temp 96.8°F | Resp 18 | Ht <= 58 in | Wt 136.2 lb

## 2018-03-04 DIAGNOSIS — Z79899 Other long term (current) drug therapy: Secondary | ICD-10-CM | POA: Diagnosis not present

## 2018-03-04 DIAGNOSIS — Z9221 Personal history of antineoplastic chemotherapy: Secondary | ICD-10-CM | POA: Insufficient documentation

## 2018-03-04 DIAGNOSIS — C187 Malignant neoplasm of sigmoid colon: Secondary | ICD-10-CM

## 2018-03-04 DIAGNOSIS — Z9049 Acquired absence of other specified parts of digestive tract: Secondary | ICD-10-CM

## 2018-03-04 DIAGNOSIS — Z800 Family history of malignant neoplasm of digestive organs: Secondary | ICD-10-CM

## 2018-03-04 DIAGNOSIS — I10 Essential (primary) hypertension: Secondary | ICD-10-CM | POA: Diagnosis not present

## 2018-03-04 DIAGNOSIS — D649 Anemia, unspecified: Secondary | ICD-10-CM

## 2018-03-04 DIAGNOSIS — C787 Secondary malignant neoplasm of liver and intrahepatic bile duct: Secondary | ICD-10-CM | POA: Insufficient documentation

## 2018-03-04 DIAGNOSIS — C182 Malignant neoplasm of ascending colon: Secondary | ICD-10-CM | POA: Insufficient documentation

## 2018-03-04 DIAGNOSIS — E119 Type 2 diabetes mellitus without complications: Secondary | ICD-10-CM

## 2018-03-04 DIAGNOSIS — D500 Iron deficiency anemia secondary to blood loss (chronic): Secondary | ICD-10-CM

## 2018-03-04 LAB — IRON AND TIBC
IRON: 77 ug/dL (ref 41–142)
SATURATION RATIOS: 28 % (ref 21–57)
TIBC: 274 ug/dL (ref 236–444)
UIBC: 197 ug/dL (ref 120–384)

## 2018-03-04 LAB — COMPREHENSIVE METABOLIC PANEL
ALT: 16 U/L (ref 0–44)
AST: 17 U/L (ref 15–41)
Albumin: 3.9 g/dL (ref 3.5–5.0)
Alkaline Phosphatase: 67 U/L (ref 38–126)
Anion gap: 8 (ref 5–15)
BUN: 19 mg/dL (ref 8–23)
CO2: 26 mmol/L (ref 22–32)
Calcium: 9.3 mg/dL (ref 8.9–10.3)
Chloride: 108 mmol/L (ref 98–111)
Creatinine, Ser: 0.75 mg/dL (ref 0.44–1.00)
GFR calc Af Amer: >60 mL/min (ref >60)
GFR calc non Af Amer: >60 mL/min (ref >60)
Glucose, Bld: 96 mg/dL (ref 70–99)
Potassium: 4 mmol/L (ref 3.5–5.1)
Sodium: 142 mmol/L (ref 135–145)
Total Bilirubin: 0.9 mg/dL (ref 0.3–1.2)
Total Protein: 6.7 g/dL (ref 6.5–8.1)

## 2018-03-04 LAB — CBC WITH DIFFERENTIAL/PLATELET
ABS IMMATURE GRANULOCYTES: 0.02 10*3/uL (ref 0.00–0.07)
Basophils Absolute: 0.1 10*3/uL (ref 0.0–0.1)
Basophils Relative: 1 %
Eosinophils Absolute: 0.1 10*3/uL (ref 0.0–0.5)
Eosinophils Relative: 2 %
HCT: 43.2 % (ref 36.0–46.0)
Hemoglobin: 14.2 g/dL (ref 12.0–15.0)
Immature Granulocytes: 0 %
Lymphocytes Relative: 17 %
Lymphs Abs: 1 10*3/uL (ref 0.7–4.0)
MCH: 29.7 pg (ref 26.0–34.0)
MCHC: 32.9 g/dL (ref 30.0–36.0)
MCV: 90.4 fL (ref 80.0–100.0)
MONO ABS: 0.4 10*3/uL (ref 0.1–1.0)
Monocytes Relative: 7 %
NEUTROS ABS: 4.3 10*3/uL (ref 1.7–7.7)
Neutrophils Relative %: 73 %
Platelets: 192 10*3/uL (ref 150–400)
RBC: 4.78 MIL/uL (ref 3.87–5.11)
RDW: 12.7 % (ref 11.5–15.5)
WBC: 6 10*3/uL (ref 4.0–10.5)
nRBC: 0 % (ref 0.0–0.2)

## 2018-03-04 LAB — RETICULOCYTES
Immature Retic Fract: 5.1 % (ref 2.3–15.9)
RBC.: 4.78 MIL/uL (ref 3.87–5.11)
Retic Count, Absolute: 66.4 10*3/uL (ref 19.0–186.0)
Retic Ct Pct: 1.4 % (ref 0.4–3.1)

## 2018-03-04 LAB — FERRITIN: Ferritin: 149 ng/mL (ref 11–307)

## 2018-03-04 LAB — CEA (IN HOUSE-CHCC): CEA (CHCC-In House): 3.86 ng/mL (ref 0.00–5.00)

## 2018-03-04 NOTE — Telephone Encounter (Signed)
Printed calendar and avs. °

## 2018-03-05 ENCOUNTER — Telehealth: Payer: Self-pay

## 2018-03-05 ENCOUNTER — Ambulatory Visit: Payer: Medicare Other | Admitting: Hematology

## 2018-03-05 ENCOUNTER — Other Ambulatory Visit: Payer: Medicare Other

## 2018-03-05 NOTE — Telephone Encounter (Signed)
Spoke with patient's daughter Donah Driver and informed her that Dr. Burr Medico is ordering a CT to be done prior to her mothers next appointment in 4 months.  Should be receiving a phone call from the schedulers.   She verbalized an understanding.

## 2018-03-11 ENCOUNTER — Ambulatory Visit (INDEPENDENT_AMBULATORY_CARE_PROVIDER_SITE_OTHER): Payer: Medicare Other | Admitting: *Deleted

## 2018-03-11 ENCOUNTER — Telehealth: Payer: Self-pay

## 2018-03-11 DIAGNOSIS — E538 Deficiency of other specified B group vitamins: Secondary | ICD-10-CM | POA: Diagnosis not present

## 2018-03-11 NOTE — Progress Notes (Signed)
Pt given Cyanocobalamin inj Tolerated well 

## 2018-03-11 NOTE — Telephone Encounter (Signed)
Spoke with patient per Dr. Burr Medico notified her latest lab results were good, iron studies good and tumor marker was normal.  Patient verbalized an understanding.

## 2018-04-12 ENCOUNTER — Ambulatory Visit (INDEPENDENT_AMBULATORY_CARE_PROVIDER_SITE_OTHER): Payer: Medicare Other | Admitting: *Deleted

## 2018-04-12 DIAGNOSIS — E538 Deficiency of other specified B group vitamins: Secondary | ICD-10-CM | POA: Diagnosis not present

## 2018-04-12 NOTE — Progress Notes (Signed)
Pt given Cyanocobalamin inj Tolerated well 

## 2018-04-20 ENCOUNTER — Telehealth: Payer: Self-pay | Admitting: Family Medicine

## 2018-04-20 NOTE — Telephone Encounter (Signed)
PT states that she has had a shingle shot in past and heard that now you need to get a booster shingle shot and pt is wanting to know if that is correct.

## 2018-04-20 NOTE — Telephone Encounter (Signed)
Patient aware she needs Shingrix. Per [facility] patient had zostavax.

## 2018-05-12 ENCOUNTER — Ambulatory Visit: Payer: Medicare Other

## 2018-05-19 ENCOUNTER — Other Ambulatory Visit: Payer: Self-pay

## 2018-05-19 ENCOUNTER — Ambulatory Visit (INDEPENDENT_AMBULATORY_CARE_PROVIDER_SITE_OTHER): Payer: Medicare Other | Admitting: *Deleted

## 2018-05-19 DIAGNOSIS — E538 Deficiency of other specified B group vitamins: Secondary | ICD-10-CM | POA: Diagnosis not present

## 2018-05-19 NOTE — Progress Notes (Signed)
Pt given cyanocobalamin inj Tolerated well 

## 2018-06-01 ENCOUNTER — Telehealth: Payer: Self-pay | Admitting: *Deleted

## 2018-06-01 NOTE — Telephone Encounter (Signed)
Received TC from patient regarding CT scans and MD appt in April. Pt was asking if she should still plan on those visits.  I advised pt to keep those appts at this time. Advised that if anything changes we will let her know. She voiced understanding.  CT Scan is scheduled on 06/24/18 and her appt with Dr. Burr Medico is on 07/08/18

## 2018-06-01 NOTE — Telephone Encounter (Signed)
Ashley Peters, please let pt know that her scan and visit will likely be rescheduled. We can reschedule it now to postpone them for 2 months, or schedule in early April when we know more about the COVID 19 pandemic situation. Thanks   Truitt Merle MD

## 2018-06-09 ENCOUNTER — Telehealth: Payer: Self-pay

## 2018-06-09 NOTE — Telephone Encounter (Signed)
Spoke with patient regarding upcoming appointments.  Due to COVID-19 we ill reschedule CT and lab and f/u for a couple of months out.  Patient was very appreciative and a scheduling message has been sent.

## 2018-06-11 ENCOUNTER — Telehealth: Payer: Self-pay | Admitting: Hematology

## 2018-06-11 NOTE — Telephone Encounter (Signed)
Rescheduled lab and f/u appt per sch msg. Called and spoke with patient. Confirmed dates and times. Provided patient with number to call and reschedule CT scan

## 2018-06-21 ENCOUNTER — Ambulatory Visit: Payer: Medicare Other

## 2018-06-24 ENCOUNTER — Ambulatory Visit (HOSPITAL_COMMUNITY): Admission: RE | Admit: 2018-06-24 | Payer: Medicare Other | Source: Ambulatory Visit

## 2018-06-24 ENCOUNTER — Other Ambulatory Visit: Payer: Medicare Other

## 2018-07-01 ENCOUNTER — Ambulatory Visit (HOSPITAL_COMMUNITY): Payer: Medicare Other

## 2018-07-08 ENCOUNTER — Ambulatory Visit: Payer: Medicare Other | Admitting: Hematology

## 2018-07-08 ENCOUNTER — Other Ambulatory Visit: Payer: Medicare Other

## 2018-07-17 IMAGING — CT CT ABD-PELV W/ CM
2 of 5 series · 15 of 46 positions shown, 17 images · IV contrast (APPLIED)
Comparison: 09/19/2016

CLINICAL DATA: Colon cancer diagnosed 0133, status post
chemotherapy

EXAM:
CT ABDOMEN AND PELVIS WITH CONTRAST
TECHNIQUE: Multidetector CT imaging of the abdomen and pelvis was performed
using the standard protocol following bolus administration of
intravenous contrast.
CONTRAST:  100mL 530DZ6-LMM IOPAMIDOL (530DZ6-LMM) INJECTION 61%

[Series 2: axial st · axial · 0.70mm/px · z∈[-413,-48]mm · 12 of 87 slices shown, 14 images]
[im 7/87  soft-tissue]
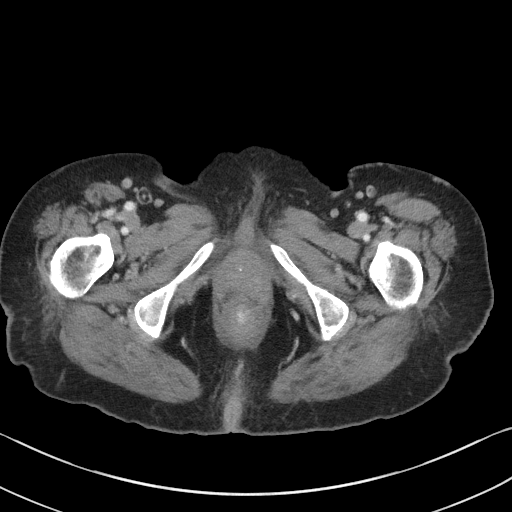
[im 7/87  bone]
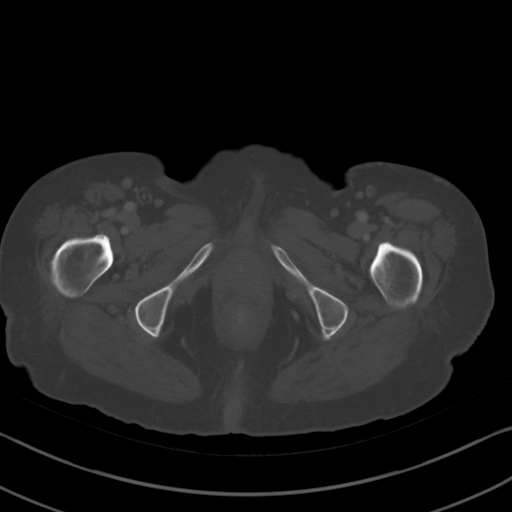
[im 14/87  soft-tissue]
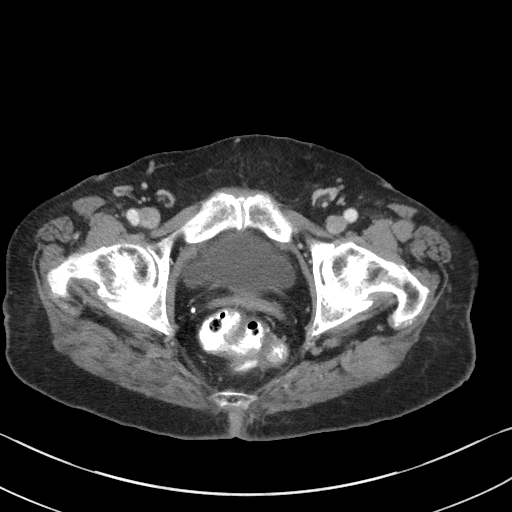
[im 20/87  soft-tissue]
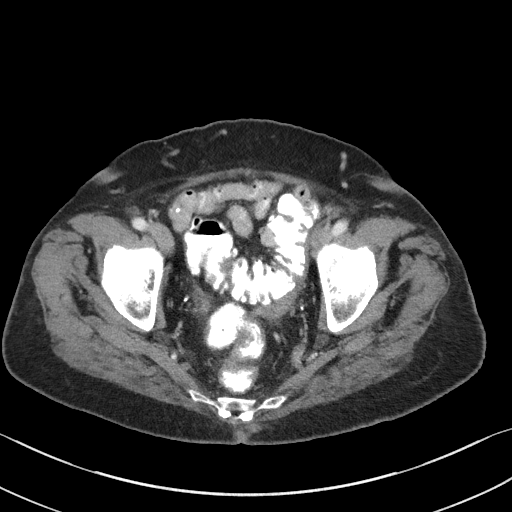
[im 27/87  soft-tissue]
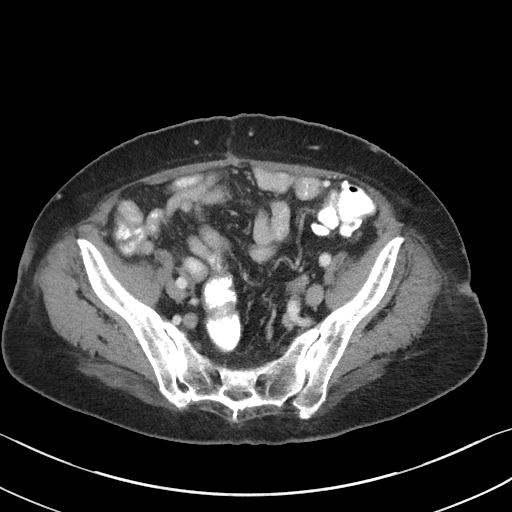
[im 34/87  soft-tissue]
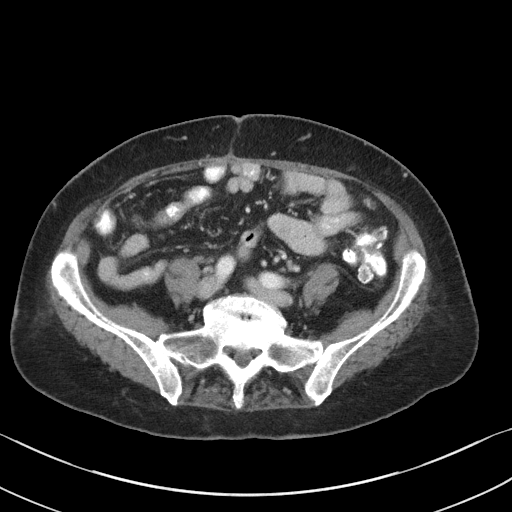
[im 40/87  soft-tissue]
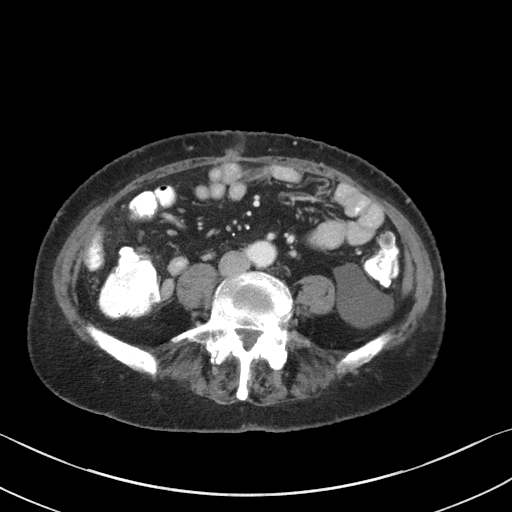
[im 47/87  soft-tissue]
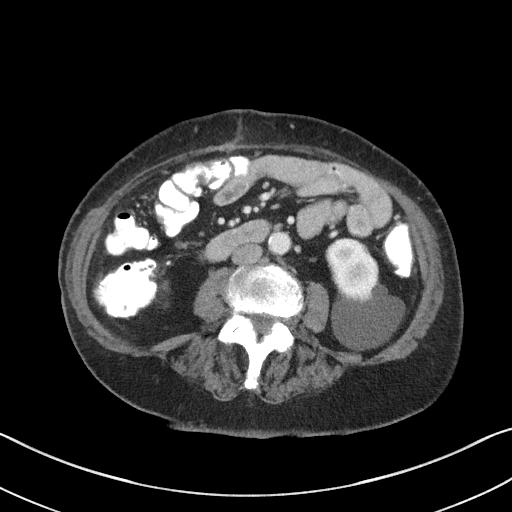
[im 53/87  soft-tissue]
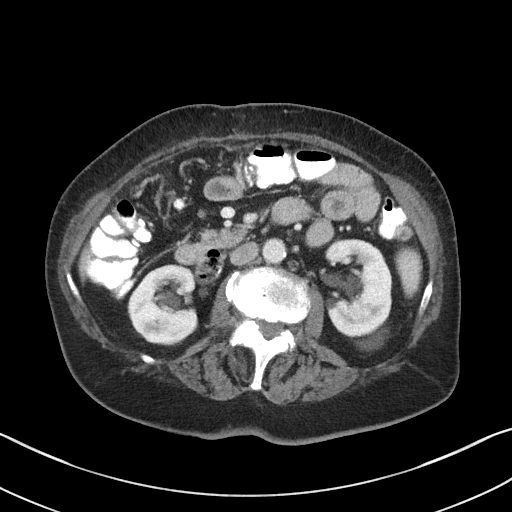
[im 60/87  soft-tissue]
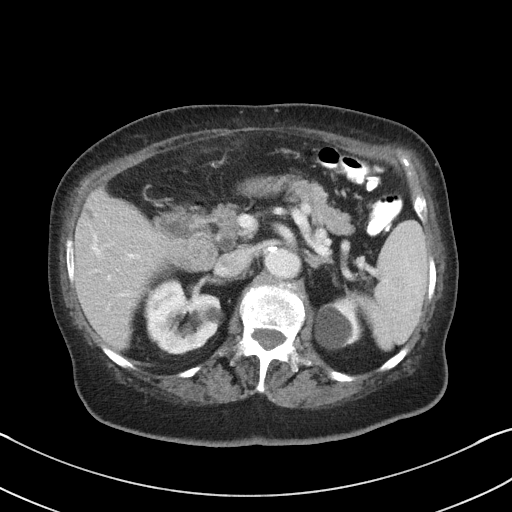
[im 60/87  bone]
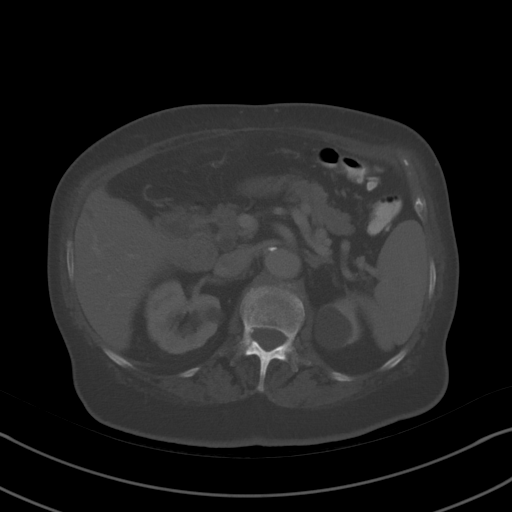
[im 67/87  soft-tissue]
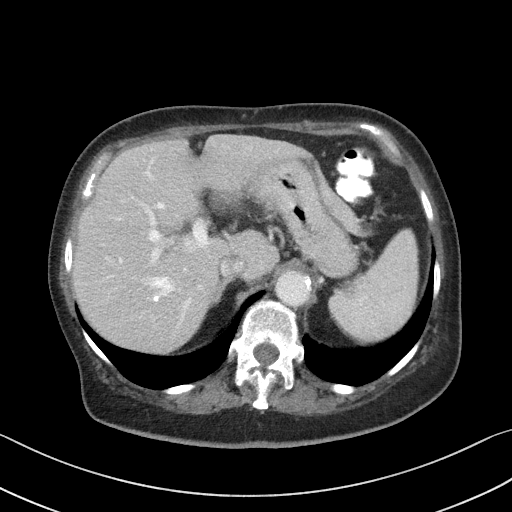
[im 73/87  soft-tissue]
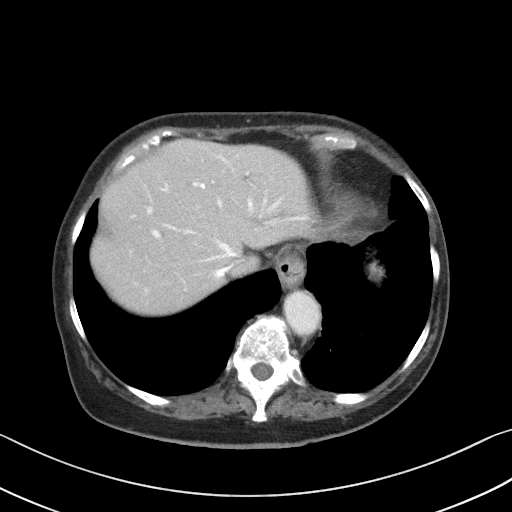
[im 80/87  soft-tissue]
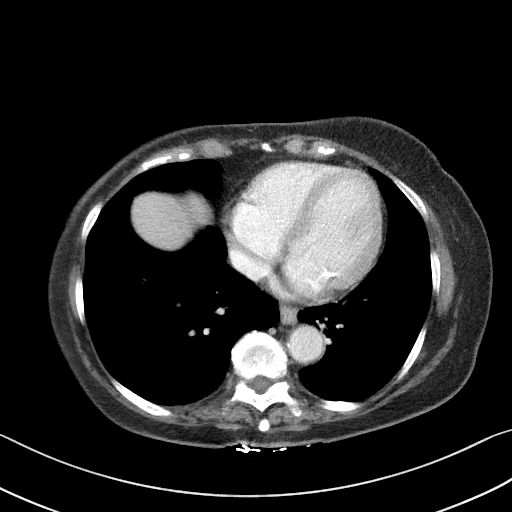

[Series 6: coronal st · coronal · 0.65mm/px · 3 of 84 slices shown]
[im 28/84  soft-tissue]
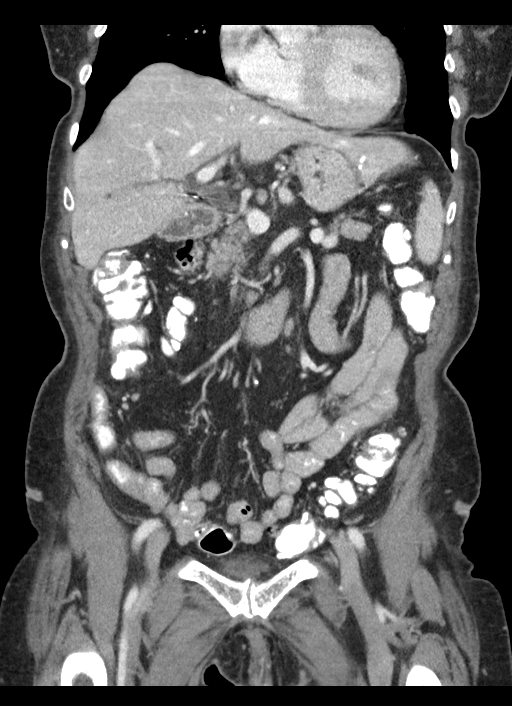
[im 37/84  soft-tissue]
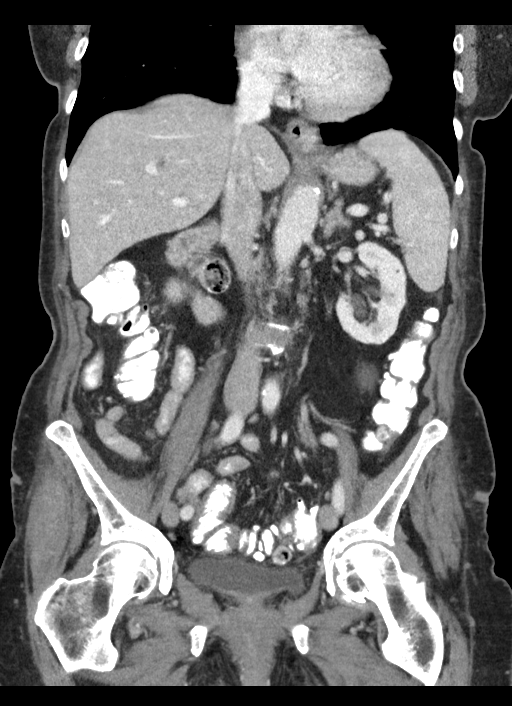
[im 47/84  soft-tissue]
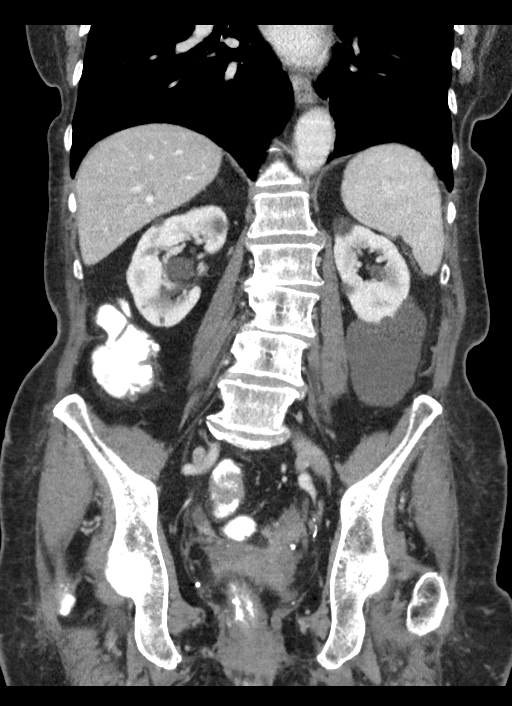

[15 of 46 positions shown; findings below may reference images not displayed]

FINDINGS: Lower chest: Lung bases are clear.

Hepatobiliary: Liver is within normal limits.

Status post cholecystectomy.

Mild intrahepatic ductal dilatation. Common duct measures 9 mm
(coronal image 33) and smoothly tapers at the ampulla, likely
postsurgical.

Pancreas: Within normal limits.

Spleen: Within normal limits.

Adrenals/Urinary Tract: Adrenal glands are within normal limits.

Bilateral renal cysts, measuring up to 5.4 cm along the left lower
kidney (series 2/image 44). No hydronephrosis.

Bladder is within normal limits.

Stomach/Bowel: Stomach is notable for a small hiatal hernia.

Small duodenal diverticuli.

No evidence of bowel obstruction.

Status post right hemicolectomy with appendectomy.

Extensive sigmoid diverticulosis, without evidence of
diverticulitis.

Vascular/Lymphatic: No evidence of abdominal aortic aneurysm.

Atherosclerotic calcifications of the abdominal aorta and branch
vessels.

No suspicious abdominopelvic lymphadenopathy.

Reproductive: Status post hysterectomy.

Bilateral ovaries are within normal limits.

Other: No abdominopelvic ascites.

Musculoskeletal: Mild degenerative changes of the visualized
thoracolumbar spine.
IMPRESSION: Status post right hemicolectomy.

No evidence of recurrent or metastatic disease.

Additional stable ancillary findings as above.

## 2018-07-22 ENCOUNTER — Other Ambulatory Visit: Payer: Self-pay | Admitting: Family Medicine

## 2018-07-30 ENCOUNTER — Other Ambulatory Visit: Payer: Self-pay

## 2018-08-03 ENCOUNTER — Other Ambulatory Visit: Payer: Self-pay

## 2018-08-03 ENCOUNTER — Telehealth: Payer: Self-pay | Admitting: *Deleted

## 2018-08-03 ENCOUNTER — Ambulatory Visit (INDEPENDENT_AMBULATORY_CARE_PROVIDER_SITE_OTHER): Payer: Medicare Other

## 2018-08-03 DIAGNOSIS — E538 Deficiency of other specified B group vitamins: Secondary | ICD-10-CM

## 2018-08-03 NOTE — Telephone Encounter (Signed)
Received vm message from patient regarding upcoming appts for lab and CT scan. Pt seeking to confirm appts and to make sure she has enough time to drink her 2 bottles of contrast. Reassured patient that she should have enough time for labs and drinking her contrast prior to CT scan. Pt also asking if her daughter can come with her.  Informed pt that visitor restrictions are still in place at this time. Also informed patient that we can assist with getting her to radiology from the lab. Pt voiced understanding

## 2018-08-18 DIAGNOSIS — H353113 Nonexudative age-related macular degeneration, right eye, advanced atrophic without subfoveal involvement: Secondary | ICD-10-CM | POA: Diagnosis not present

## 2018-08-18 DIAGNOSIS — H35731 Hemorrhagic detachment of retinal pigment epithelium, right eye: Secondary | ICD-10-CM | POA: Diagnosis not present

## 2018-08-18 DIAGNOSIS — H35359 Cystoid macular degeneration, unspecified eye: Secondary | ICD-10-CM | POA: Diagnosis not present

## 2018-08-18 DIAGNOSIS — H353123 Nonexudative age-related macular degeneration, left eye, advanced atrophic without subfoveal involvement: Secondary | ICD-10-CM | POA: Diagnosis not present

## 2018-08-18 DIAGNOSIS — H353211 Exudative age-related macular degeneration, right eye, with active choroidal neovascularization: Secondary | ICD-10-CM | POA: Diagnosis not present

## 2018-08-20 ENCOUNTER — Ambulatory Visit: Payer: Medicare Other | Admitting: Family Medicine

## 2018-08-24 ENCOUNTER — Other Ambulatory Visit: Payer: Self-pay

## 2018-08-24 ENCOUNTER — Encounter (HOSPITAL_COMMUNITY): Payer: Self-pay

## 2018-08-24 ENCOUNTER — Telehealth: Payer: Self-pay | Admitting: Family Medicine

## 2018-08-24 ENCOUNTER — Ambulatory Visit (HOSPITAL_COMMUNITY)
Admission: RE | Admit: 2018-08-24 | Discharge: 2018-08-24 | Disposition: A | Discharge status: Home / Self Care | Payer: Medicare Other | Source: Ambulatory Visit | Attending: Hematology | Admitting: Hematology

## 2018-08-24 ENCOUNTER — Inpatient Hospital Stay: Payer: Medicare Other | Attending: Hematology

## 2018-08-24 DIAGNOSIS — D500 Iron deficiency anemia secondary to blood loss (chronic): Secondary | ICD-10-CM

## 2018-08-24 DIAGNOSIS — C182 Malignant neoplasm of ascending colon: Secondary | ICD-10-CM

## 2018-08-24 LAB — IRON AND TIBC
Iron: 109 ug/dL (ref 41–142)
Saturation Ratios: 38 % (ref 21–57)
TIBC: 290 ug/dL (ref 236–444)
UIBC: 181 ug/dL (ref 120–384)

## 2018-08-24 LAB — COMPREHENSIVE METABOLIC PANEL
ALT: 18 U/L (ref 0–44)
AST: 22 U/L (ref 15–41)
Albumin: 4.2 g/dL (ref 3.5–5.0)
Alkaline Phosphatase: 82 U/L (ref 38–126)
Anion gap: 10 (ref 5–15)
BUN: 12 mg/dL (ref 8–23)
CO2: 24 mmol/L (ref 22–32)
Calcium: 9 mg/dL (ref 8.9–10.3)
Chloride: 105 mmol/L (ref 98–111)
Creatinine, Ser: 0.73 mg/dL (ref 0.44–1.00)
GFR calc Af Amer: >60 mL/min (ref >60)
GFR calc non Af Amer: >60 mL/min (ref >60)
Glucose, Bld: 89 mg/dL (ref 70–99)
Potassium: 3.7 mmol/L (ref 3.5–5.1)
Sodium: 139 mmol/L (ref 135–145)
Total Bilirubin: 0.8 mg/dL (ref 0.3–1.2)
Total Protein: 7.2 g/dL (ref 6.5–8.1)

## 2018-08-24 LAB — CBC WITH DIFFERENTIAL/PLATELET
Abs Immature Granulocytes: 0.01 10*3/uL (ref 0.00–0.07)
Basophils Absolute: 0.1 10*3/uL (ref 0.0–0.1)
Basophils Relative: 1 %
Eosinophils Absolute: 0 10*3/uL (ref 0.0–0.5)
Eosinophils Relative: 1 %
HCT: 44.2 % (ref 36.0–46.0)
Hemoglobin: 14.6 g/dL (ref 12.0–15.0)
Immature Granulocytes: 0 %
Lymphocytes Relative: 20 %
Lymphs Abs: 1.3 10*3/uL (ref 0.7–4.0)
MCH: 30.2 pg (ref 26.0–34.0)
MCHC: 33 g/dL (ref 30.0–36.0)
MCV: 91.5 fL (ref 80.0–100.0)
Monocytes Absolute: 0.4 10*3/uL (ref 0.1–1.0)
Monocytes Relative: 6 %
Neutro Abs: 4.7 10*3/uL (ref 1.7–7.7)
Neutrophils Relative %: 72 %
Platelets: 204 10*3/uL (ref 150–400)
RBC: 4.83 MIL/uL (ref 3.87–5.11)
RDW: 12.4 % (ref 11.5–15.5)
WBC: 6.4 10*3/uL (ref 4.0–10.5)
nRBC: 0 % (ref 0.0–0.2)

## 2018-08-24 LAB — RETICULOCYTES
Immature Retic Fract: 2.6 % (ref 2.3–15.9)
RBC.: 4.83 MIL/uL (ref 3.87–5.11)
Retic Count, Absolute: 58.9 10*3/uL (ref 19.0–186.0)
Retic Ct Pct: 1.2 % (ref 0.4–3.1)

## 2018-08-24 LAB — FERRITIN: Ferritin: 174 ng/mL (ref 11–307)

## 2018-08-24 LAB — CEA (IN HOUSE-CHCC): CEA (CHCC-In House): 2.91 ng/mL (ref 0.00–5.00)

## 2018-08-24 MED ORDER — SODIUM CHLORIDE (PF) 0.9 % IJ SOLN
INTRAMUSCULAR | Status: AC
  Filled 2018-08-24: qty 50

## 2018-08-24 MED ORDER — IOHEXOL 300 MG/ML  SOLN
100.0000 mL | Freq: Once | INTRAMUSCULAR | Status: AC | PRN
  Administered 2018-08-24: 100 mL via INTRAVENOUS

## 2018-08-25 ENCOUNTER — Ambulatory Visit (INDEPENDENT_AMBULATORY_CARE_PROVIDER_SITE_OTHER): Payer: Medicare Other | Admitting: Family Medicine

## 2018-08-25 ENCOUNTER — Encounter: Payer: Self-pay | Admitting: Family Medicine

## 2018-08-25 ENCOUNTER — Telehealth: Payer: Self-pay | Admitting: Hematology

## 2018-08-25 VITALS — BP 144/81 | HR 79 | Temp 96.9°F | Ht <= 58 in | Wt 138.8 lb

## 2018-08-25 DIAGNOSIS — D500 Iron deficiency anemia secondary to blood loss (chronic): Secondary | ICD-10-CM

## 2018-08-25 DIAGNOSIS — E538 Deficiency of other specified B group vitamins: Secondary | ICD-10-CM

## 2018-08-25 DIAGNOSIS — M8580 Other specified disorders of bone density and structure, unspecified site: Secondary | ICD-10-CM | POA: Diagnosis not present

## 2018-08-25 DIAGNOSIS — E782 Mixed hyperlipidemia: Secondary | ICD-10-CM | POA: Diagnosis not present

## 2018-08-25 DIAGNOSIS — I10 Essential (primary) hypertension: Secondary | ICD-10-CM | POA: Diagnosis not present

## 2018-08-25 DIAGNOSIS — K219 Gastro-esophageal reflux disease without esophagitis: Secondary | ICD-10-CM

## 2018-08-25 MED ORDER — FERROUS SULFATE 325 (65 FE) MG PO TABS: 325.0000 mg | ORAL_TABLET | Freq: Two times a day (BID) | ORAL | 3 refills | Status: DC

## 2018-08-25 NOTE — Telephone Encounter (Signed)
Spoke with patient dtr re converting 6/22 f/u to [employer] video. Patient wanted me to speak with dtr.

## 2018-08-25 NOTE — Progress Notes (Signed)
BP (!) 144/81   Pulse 79   Temp (!) 96.9 F (36.1 C) (Oral)   Ht 4\' 9"  (1.448 m)   Wt 138 lb 12.8 oz (63 kg)   BMI 30.04 kg/m    Subjective:   Patient ID: Ashley Peters, female    DOB: 09/08/1928, 85 y.o.   MRN: [id number]  HPI: Ashley Peters is a 85 y.o. female presenting on 08/25/2018 for Hypertension and leg weekness (bilateral x 2 weeks)   HPI Hypertension Patient is currently on no medication currently we are monitoring, and their blood pressure today is 144/81. Patient denies any lightheadedness or dizziness. Patient denies headaches, blurred vision, chest pains, shortness of breath, or weakness. Denies any side effects from medication and is content with current medication.  She said her blood pressures been up some at the cancer doctor's office but in our office today it looks fine and we are allowing permissive hypertensive for age.  Hyperlipidemia Patient is coming in for recheck of his hyperlipidemia. The patient is currently taking no medication currently we are monitoring and rechecking.. They deny any issues with myalgias or history of liver damage from it. They deny any focal numbness or weakness or chest pain.   GERD Patient is currently on no medication currently and has been doing well.  She denies any major symptoms or abdominal pain or belching or burping. She denies any blood in her stool or lightheadedness or dizziness.   Patient has osteopenia and vitamin B12 deficiency and anemia and is coming in for recheck today.  She denies any lightheadedness or dizziness or frequent falls or fractures.  She takes iron and alendronate and vitamin B12 injections.  Patient has had some intermittent leg weakness like what she had when she was anemic and wants to get it checked again.  Relevant past medical, surgical, family and social history reviewed and updated as indicated. Interim medical history since our last visit reviewed. Allergies and medications reviewed and  updated.  Review of Systems  Constitutional: Positive for fatigue. Negative for chills and fever.  Eyes: Negative for redness and visual disturbance.  Respiratory: Negative for chest tightness and shortness of breath.   Cardiovascular: Negative for chest pain and leg swelling.  Musculoskeletal: Negative for back pain and gait problem.  Skin: Negative for rash.  Neurological: Positive for weakness. Negative for light-headedness and headaches.  Psychiatric/Behavioral: Negative for agitation and behavioral problems.  All other systems reviewed and are negative.   Per HPI unless specifically indicated above   Allergies as of 08/25/2018      Reactions   Benicar [olmesartan Medoxomil]    Elevated Potassium   Amoxicillin Rash   Has patient had a PCN reaction causing immediate rash, facial/tongue/throat swelling, SOB or lightheadedness with hypotension: Yes Has patient had a PCN reaction causing severe rash involving mucus membranes or skin necrosis: No Has patient had a PCN reaction that required hospitalization: No Has patient had a PCN reaction occurring within the last 10 years: No If all of the above answers are "NO", then may proceed with Cephalosporin use.      Medication List       Accurate as of August 25, 2018  9:33 AM. If you have any questions, ask your nurse or doctor.        acetaminophen 500 MG tablet Commonly known as: TYLENOL Take 2 tablets (1,000 mg total) by mouth every 6 (six) hours as needed for moderate pain.   alendronate 70 MG tablet  Commonly known as: FOSAMAX TAKE 1 TABLET EVERY 7 DAYS WITH A FULL GLASS OF WATER ON AN EMPTY STOMACH   cholecalciferol 1000 units tablet Commonly known as: VITAMIN D Take 1,000 Units by mouth daily.   diphenoxylate-atropine 2.5-0.025 MG tablet Commonly known as: LOMOTIL Take 1 tablet 4 (four) times daily as needed by mouth for diarrhea or loose stools.   ferrous sulfate 325 (65 FE) MG tablet Take 1 tablet (325 mg total)  by mouth 2 (two) times daily with a meal. What changed: additional instructions Changed by: [name] Valrie Jia, MD   PRESERVISION AREDS 2 PO Take 1 capsule by mouth 2 (two) times daily.        Objective:   BP (!) 144/81   Pulse 79   Temp (!) 96.9 F (36.1 C) (Oral)   Ht 4\' 9"  (1.448 m)   Wt 138 lb 12.8 oz (63 kg)   BMI 30.04 kg/m   Wt Readings from Last 3 Encounters:  08/25/18 138 lb 12.8 oz (63 kg)  03/04/18 136 lb 3.2 oz (61.8 kg)  12/23/17 133 lb (60.3 kg)    Physical Exam Vitals signs and nursing note reviewed.  Constitutional:      General: She is not in acute distress.    Appearance: She is well-developed. She is not diaphoretic.  Eyes:     Conjunctiva/sclera: Conjunctivae normal.  Cardiovascular:     Rate and Rhythm: Normal rate and regular rhythm.     Heart sounds: Normal heart sounds. No murmur.  Pulmonary:     Effort: Pulmonary effort is normal. No respiratory distress.     Breath sounds: Normal breath sounds. No wheezing.  Musculoskeletal: Normal range of motion.        General: No tenderness.  Skin:    General: Skin is warm and dry.     Findings: No rash.  Neurological:     Mental Status: She is alert and oriented to person, place, and time.     Coordination: Coordination normal.  Psychiatric:        Behavior: Behavior normal.       Assessment & Plan:   Problem List Items Addressed This Visit      Cardiovascular and Mediastinum   HTN (hypertension) - Primary     Digestive   GERD (gastroesophageal reflux disease)     Musculoskeletal and Integument   Osteopenia     Other   Hyperlipidemia   Iron deficiency anemia due to chronic blood loss   Relevant Medications   ferrous sulfate 325 (65 FE) MG tablet    Other Visit Diagnoses    Vitamin B 12 deficiency       Relevant Orders   Vitamin B12 (Completed)    Keep patient B12 going  Leg weakness is likely to decreased activity, recommended increasing exercise and activity as much as  possible with the coronavirus.  Patient denies any back pain or pain anywhere else.  Her blood counts look good which is what she thought was the cause of it.  She sees oncology and continues to get scans as needed for her colon cancer.  Her last scan looked good.  Continue iron, her blood counts look good right now.  For osteopenia she is not due for rescan but continue the alendronate  Follow up plan: Return in about 6 months (around 02/24/2019), or if symptoms worsen or fail to improve, for Recheck osteopenia and hyperlipidemia.  Counseling provided for all of the vaccine components Orders Placed This Encounter  Procedures  . Vitamin B12    Caryl Pina, MD [facility] 08/25/2018, 9:33 AM

## 2018-08-26 LAB — VITAMIN B12: Vitamin B-12: 318 pg/mL (ref 232–1245)

## 2018-08-27 ENCOUNTER — Telehealth: Payer: Self-pay | Admitting: Hematology

## 2018-08-27 NOTE — Telephone Encounter (Signed)
Called pt to remind about video call

## 2018-08-27 NOTE — Progress Notes (Signed)
[place] [facility]   [phone] [phone]   Clinic Follow up Note   Patient Care Team: Dettinger, [name], MD as PCP - General (Family Medicine) Clent Jacks, MD as Consulting Physician (Ophthalmology) Zadie Rhine Clent Demark, MD as Consulting Physician (Ophthalmology) Donnie Mesa, MD as Consulting Physician (General Surgery) Truitt Merle, MD as Consulting Physician (Hematology)  I connected with [name] on 08/30/2018 at 11:15 AM EDT by video/telephone and verified that I am speaking with the correct person using two identifiers.   I discussed the limitations, risks, security and privacy concerns of performing an evaluation and management service by telephone and the availability of in person appointments. I also discussed with the patient that there may be a patient responsible charge related to this service. The patient expressed understanding and agreed to proceed.   Other persons participating in the visit and their role in the encounter: her daughter  Patient's location: Home Provider's location: Clinic  CHIEF COMPLAINT: F/u on right colon cancer  SUMMARY OF ONCOLOGIC HISTORY: Oncology History Overview Note  Cancer Staging Cancer of right colon [facility]) Staging form: Colon and Rectum, AJCC 8th Edition - Pathologic stage from 10/14/2016: Stage IIC (pT4b, pN0, cM0) - Signed by Truitt Merle, MD on 11/07/2016     Cancer of right colon ([place])  09/19/2016 Imaging   CT A/P 09/19/16 IMPRESSION: Large 9.5 cm intraluminal mass in the cecum and ascending colon, consistent with colon carcinoma. Tumor extension into adjacent pericolonic fat seen along the lateral wall.  Mild right pericolonic and mesenteric lymphadenopathy, suspicious for metastatic disease. No other sites of metastatic disease identified.  Colonic diverticulosis, without radiographic evidence of diverticulitis. Tiny hiatal hernia, and small epigastric ventral hernia containing only  fat.  Aortic atherosclerosis.   10/14/2016 Initial Diagnosis   Cancer of right colon ([place])   10/14/2016 Surgery   OPEN RIGHT HEMICOLECTOMY and REPAIR OF EPIGASTRIC VENTRAL HERNIA by Dr. Georgette Dover and Dr. Dalbert Batman    10/14/2016 Pathology Results   10/14/16 Diagnosis Colon, segmental resection for tumor, Right ADENOCARCINOMA WITH EXTENSIVE EXTRA CELLULAR MUCIN, GRADE 1, (11.0 CM) THE TUMOR INVADES THROUGH THE CECUM WALL INTO ADJACENT TERMINAL ILEUM (PT4B) NINETEEN BENIGN LYMPH NODES (0/19) SUPPURATIVE INFLAMMATION WITH FIBROSIS AND ADHESION TO ABDOMINAL WALL NO ADENOCARCINOMA IDENTIFIED   11/19/2016 Imaging   CT Chest WO Contrast 11/19/16 IMPRESSION: 1. No evidence of metastatic disease. 2. Aortic atherosclerosis (ICD10-170.0). Coronary artery calcification.   11/25/2016 - 03/24/2017 Chemotherapy   Adjuvant Xeloda two weeks on, one week off starting 11/25/16 for 3 months     12/21/2016 Genetic Testing   Patient had genetic testing due to a personal history of colon cancer and a family history of breast, uterine, and colon cancer. The Common Hereditary Cancers Panel was ordered.  The Hereditary Gene Panel offered by [facility] includes sequencing and/or deletion duplication testing of the following 47 genes: APC, ATM, AXIN2, BARD1, BMPR1A, BRCA1, BRCA2, BRIP1, CDH1, CDKN2A (p14ARF), CDKN2A (p16INK4a), CHEK2, CDK4, CTNNA1, DICER1, EPCAM (Deletion/duplication testing only), GREM1 (promoter region deletion/duplication testing only), KIT, MEN1, MLH1, MSH2, MSH3, MSH6, MUTYH, NBN, NF1, NHTL1, PALB2, PDGFRA, PMS2, POLD1, POLE, PTEN, RAD50, RAD51C, RAD51D, SDHB, SDHC, SDHD, SMAD4, SMARCA4. STK11, TP53, TSC1, TSC2, and VHL.  The following genes were evaluated for sequence changes only: SDHA and HOXB13 c.251G>A variant only.  Results: Negative- no pathogenic variants identified.  The date of this test report is 12/21/2016.    04/27/2017 Imaging   CT AP W Contrast 04/27/17 IMPRESSION: Status post right  hemicolectomy. No  evidence of recurrent or metastatic disease. Additional stable ancillary findings as above.   08/07/2017 Imaging   08/07/2017 DEXA ASSESSMENT: The BMD measured at Forearm Radius 33% is 0.543 g/cm2 with a T-score of -3.9. This patient is considered osteoporotic according to [facility]) criteria.   11/25/2017 Imaging    11/25/2017 CT CAP IMPRESSION: 1. Abnormal appearance of the right lobe of the liver, with development of progressive peripheral intrahepatic duct dilatation with ill definition of the more central right hepatic duct. This appearance is indeterminate. Considerations include an incidental primary liver lesion such as cholangiocarcinoma, an otherwise occult liver metastasis with secondary biliary duct dilatation, or infectious/inflammatory cholangitis. If this 85 year old can undergo MRI/MRCP, this should be considered. Multiphase CT (with delayed phase imaging to evaluate for possible cholangiocarcinoma) and/or focused ultrasound would be alternate imaging strategies. 2. Otherwise, no evidence of metastatic disease in the chest, abdomen, or pelvis. 3. Coronary artery atherosclerosis. Aortic Atherosclerosis (ICD10-I70.0).   12/10/2017 Imaging   12/10/2017 MRI Abdomen IMPRESSION: 1. Stricturing of the central bile ducts of segment V RIGHT hepatic lobe without clear lesion identified. Differential would include benign and malignant stricturing including cholangiocarcinoma. Evaluation is hampered by clip artifact from cholecystectomy clips and duodenum gas. FDG PET scan may or may not be helpful in identify lesion. ERCP would be a second option to evaluate this central obstruction. 2. No enhancing lesion the in the liver parenchyma parenchyma. Altered perfusion related to the biliary obstruction.   12/23/2017 Procedure   12/23/2017 Colonoscopy Impressions: - Decreased sphincter tone found on digital rectal exam. - Two 3 mm polyps in  the rectum and in the descending colon, removed with a cold snare. Resected and retrieved. - One 8 mm polyp in the rectum, removed with a hot snare. Resected and retrieved. - Diverticulosis in the entire examined colon. - The examination was otherwise normal on direct and retroflexion views.   08/24/2018 Imaging   CT AP  IMPRESSION: 1. Over the last 9 months there has been a similar not significantly progressive appearance of biliary dilatation and complexity in segment 5 of the liver along with mild intrahepatic biliary dilatation elsewhere, and moderate extrahepatic biliary dilatation. Some of this is likely postinflammatory; the most complex and dilated bile duct did not have definite enhancement on the recent MRI to definitively indicate cholangiocarcinoma or metastatic lesion but the appearance of the right hepatic lobe is progressive from 09/19/2016 and from 04/27/2017. In the context of the patient's colon cancer, nuclear medicine PET-CT may provide some helpful adjunct information given this unusual appearance. 2. Other imaging findings of potential clinical significance: Mitral calcification. Aortoiliac atherosclerotic vascular disease. Renal cysts. Trace amount of gas in the urinary bladder, query recent catheterization. Descending and sigmoid colon diverticulosis. Two ventral hernias are enlarged compared to the prior exam but lax with wide ostium. No overt pathologic adenopathy currently.      CURRENT THERAPY:  Surveillance  INTERVAL HISTORY:  Ashley Peters is here for a follow up colon cancer. She was last seen by me 6 months ago. She is doing well. She denies any abdominal pain but notes mild bloating. Appetite is good and she reports gaining weight. She has occasional diarrhea. She lives alone and is able to perform all ADLs.     REVIEW OF SYSTEMS:   Constitutional: Denies fevers, chills or abnormal weight loss Eyes: Denies blurriness of vision Ears, nose,  mouth, throat, and face: Denies mucositis or sore throat Respiratory: Denies cough, dyspnea or wheezes Cardiovascular: Denies  palpitation, chest discomfort or lower extremity swelling Gastrointestinal:  Denies nausea, heartburn (+) occasional diarrhea Skin: Denies abnormal skin rashes Lymphatics: Denies new lymphadenopathy or easy bruising Neurological:Denies numbness, tingling or new weaknesses Behavioral/Psych: Mood is stable, no new changes  All other systems were reviewed with the patient and are negative.  MEDICAL HISTORY:  Past Medical History:  Diagnosis Date   Anemia    Blood transfusion without reported diagnosis    Cancer ([place])    Colon cancer ([place])    Diverticulosis    Family history of breast cancer    Family history of colon cancer    Family history of uterine cancer    GERD (gastroesophageal reflux disease)    History of kidney stones    Hyperlipidemia    Hypertension    Macular degeneration    Osteopenia     SURGICAL HISTORY: Past Surgical History:  Procedure Laterality Date   ABDOMINAL HYSTERECTOMY     ablation tr Great saphenous vein  10/17/2009   BREAST EXCISIONAL BIOPSY Bilateral 1971   brest biopsy  1971   CHOLECYSTECTOMY     EPIGASTRIC HERNIA REPAIR N/A 10/14/2016   Procedure: REPAIR OF EPIGASTRIC VENTRAL HERNIA;  Surgeon: Donnie Mesa, MD;  Location: [facility];  Service: General;  Laterality: N/A;   lt. distal ureteral stone extraction  09/14/1991   PARTIAL COLECTOMY Right 10/14/2016   Procedure: OPEN RIGHT HEMICOLECTOMY;  Surgeon: Donnie Mesa, MD;  Location: [facility];  Service: General;  Laterality: Right;    I have reviewed the social history and family history with the patient and they are unchanged from previous note.  ALLERGIES:  is allergic to benicar [olmesartan medoxomil] and amoxicillin.  MEDICATIONS:  Current Outpatient Medications  Medication Sig Dispense Refill   acetaminophen (TYLENOL) 500 MG tablet Take 2 tablets  (1,000 mg total) by mouth every 6 (six) hours as needed for moderate pain. 30 tablet 0   alendronate (FOSAMAX) 70 MG tablet TAKE 1 TABLET EVERY 7 DAYS WITH A FULL GLASS OF WATER ON AN EMPTY STOMACH 12 tablet 0   cholecalciferol (VITAMIN D) 1000 units tablet Take 1,000 Units by mouth daily.     diphenoxylate-atropine (LOMOTIL) 2.5-0.025 MG tablet Take 1 tablet 4 (four) times daily as needed by mouth for diarrhea or loose stools. 60 tablet 0   ferrous sulfate 325 (65 FE) MG tablet Take 1 tablet (325 mg total) by mouth 2 (two) times daily with a meal. 180 tablet 3   Multiple Vitamins-Minerals (PRESERVISION AREDS 2 PO) Take 1 capsule by mouth 2 (two) times daily.     Current Facility-Administered Medications  Medication Dose Route Frequency Provider Last Rate Last Dose   cyanocobalamin ((VITAMIN B-12)) injection 1,000 mcg  1,000 mcg Intramuscular Q30 days Dettinger, [name], MD   1,000 mcg at 08/03/18 0932    PHYSICAL EXAMINATION: ECOG PERFORMANCE STATUS: 0 - Asymptomatic  VITALS AND EXAM NOT TAKEN TODAY    LABORATORY DATA:  I have reviewed the data as listed CBC Latest Ref Rng & Units 08/24/2018 03/04/2018 12/01/2017  WBC 4.0 - 10.5 K/uL 6.4 6.0 6.2  Hemoglobin 12.0 - 15.0 g/dL 14.6 14.2 14.6  Hematocrit 36.0 - 46.0 % 44.2 43.2 43.5  Platelets 150 - 400 K/uL 204 192 215     CMP Latest Ref Rng & Units 08/24/2018 03/04/2018 12/01/2017  Glucose 70 - 99 mg/dL 89 96 87  BUN 8 - 23 mg/dL '12 19 11  ' Creatinine 0.44 - 1.00 mg/dL 0.73 0.75 0.70  Sodium 135 -  145 mmol/L 139 142 143  Potassium 3.5 - 5.1 mmol/L 3.7 4.0 3.1(L)  Chloride 98 - 111 mmol/L 105 108 106  CO2 22 - 32 mmol/L '24 26 28  ' Calcium 8.9 - 10.3 mg/dL 9.0 9.3 10.6(H)  Total Protein 6.5 - 8.1 g/dL 7.2 6.7 6.9  Total Bilirubin 0.3 - 1.2 mg/dL 0.8 0.9 1.0  Alkaline Phos 38 - 126 U/L 82 67 89  AST 15 - 41 U/L '22 17 25  ' ALT 0 - 44 U/L '18 16 27     ' RADIOGRAPHIC STUDIES: I have personally reviewed the radiological images as  listed and agreed with the findings in the report. No results found.   ASSESSMENT & PLAN:  KHARTER SESTAK is a 85 y.o. female with   1. Cancer of the right colon, invasive adenocarcinoma, G1, pT4bN0Mx stage IIA, MSI-stable  -Diagnosed in 09/2016. Genetic testing was negative. Treated with right hemicolectomy and adjuvant Xeloda. Currently on surveillance.  -She had a colonoscopy on 12/23/2017 with a few polyps removed.  Due to her advanced age, no further surveillance colonoscopy was recommended.  -We discussed CT AP from 08/24/18 showed no evidence of cncer recurrence. However she has persistent mild intrahepatic biliary dilatation, not sure about the etiology, will discuss in tumor board this week. I discussed with pt and her daughter in detail  -Labs on 08/24/18 reviewed, CBC, CMP were all WNL. -She is clinically doing well, no pain or other complains, no clinical concern for recurrence -Continue colon cancer surveillance.  2. Anemia and B12 deficiency  -She currently receives monthly B12 injections. Last injection was on 08/24/2017 at her PCP. Tolerating well.  -anemia resolved   3. Borderline DM and HTN -f/u with PCP and continue recommended medications  4. Osteoporosis -Currently on Fosamax, vitamin D and calcium. Continue. -Last DEXA was in 07/2017 and showed T score of -3.9 at radius forearm -f/u with PCP   Plan  -Continue surveillance. -RTC in 6 months with labs -GI tumor board to review her scans       No problem-specific Assessment & Plan notes found for this encounter.   No orders of the defined types were placed in this encounter.  I discussed the assessment and treatment plan with the patient. The patient was provided an opportunity to ask questions and all were answered. The patient agreed with the plan and demonstrated an understanding of the instructions.  The patient was advised to call back or seek an in-person evaluation if the symptoms worsen or if the  condition fails to improve as anticipated.  I provided 15 minutes of face-to-face video visit time during this encounter, and > 50% was spent counseling as documented under my assessment & plan.     Truitt Merle, MD 08/30/2018   I, Joslyn Devon, am acting as scribe for Truitt Merle, MD.   I have reviewed the above documentation for accuracy and completeness, and I agree with the above.

## 2018-08-30 ENCOUNTER — Telehealth: Payer: Self-pay | Admitting: Hematology

## 2018-08-30 ENCOUNTER — Encounter: Payer: Self-pay | Admitting: Hematology

## 2018-08-30 ENCOUNTER — Inpatient Hospital Stay (HOSPITAL_BASED_OUTPATIENT_CLINIC_OR_DEPARTMENT_OTHER): Payer: Medicare Other | Admitting: Hematology

## 2018-08-30 DIAGNOSIS — C182 Malignant neoplasm of ascending colon: Secondary | ICD-10-CM | POA: Diagnosis not present

## 2018-08-30 DIAGNOSIS — D500 Iron deficiency anemia secondary to blood loss (chronic): Secondary | ICD-10-CM

## 2018-08-30 DIAGNOSIS — M810 Age-related osteoporosis without current pathological fracture: Secondary | ICD-10-CM | POA: Diagnosis not present

## 2018-08-30 DIAGNOSIS — E538 Deficiency of other specified B group vitamins: Secondary | ICD-10-CM | POA: Diagnosis not present

## 2018-08-30 DIAGNOSIS — I10 Essential (primary) hypertension: Secondary | ICD-10-CM

## 2018-08-30 DIAGNOSIS — D649 Anemia, unspecified: Secondary | ICD-10-CM

## 2018-08-30 NOTE — Telephone Encounter (Signed)
Scheduled appt per 6/22 los. Spoke with patient and patient aware of appt date and time. °

## 2018-09-03 ENCOUNTER — Other Ambulatory Visit: Payer: Self-pay

## 2018-09-06 ENCOUNTER — Other Ambulatory Visit: Payer: Self-pay

## 2018-09-06 ENCOUNTER — Ambulatory Visit (INDEPENDENT_AMBULATORY_CARE_PROVIDER_SITE_OTHER): Payer: Medicare Other | Admitting: *Deleted

## 2018-09-06 DIAGNOSIS — E538 Deficiency of other specified B group vitamins: Secondary | ICD-10-CM | POA: Diagnosis not present

## 2018-09-06 NOTE — Progress Notes (Signed)
Pt given Cyanocobalamin inj Tolerated well 

## 2018-09-08 ENCOUNTER — Telehealth: Payer: Self-pay | Admitting: *Deleted

## 2018-09-08 ENCOUNTER — Telehealth: Payer: Self-pay | Admitting: Hematology

## 2018-09-08 NOTE — Telephone Encounter (Signed)
Scheduled appt per 7/1 sch message- pt aware of appt date and time   

## 2018-09-08 NOTE — Telephone Encounter (Signed)
I called Ashley Peters per Dr. Ernestina Penna request to inform her that the recommendation from the tumor board discussion this AM is to observe her liver function with no other intervention at this time.  I also informed her that Dr. Burr Medico would like to check her liver function tests in two months and that we will be scheduling an appointment with the lab.  She was able to repeat information and verbalized understanding.  I advised her to call if she experiences any changes, has any needs or concerns.

## 2018-09-14 ENCOUNTER — Other Ambulatory Visit: Payer: Self-pay

## 2018-09-14 NOTE — Progress Notes (Signed)
Observation for now. If becomes symptomatic place stent, if LFT's increase, refer for EUS.

## 2018-09-22 DIAGNOSIS — H353113 Nonexudative age-related macular degeneration, right eye, advanced atrophic without subfoveal involvement: Secondary | ICD-10-CM | POA: Diagnosis not present

## 2018-09-22 DIAGNOSIS — H353123 Nonexudative age-related macular degeneration, left eye, advanced atrophic without subfoveal involvement: Secondary | ICD-10-CM | POA: Diagnosis not present

## 2018-09-22 DIAGNOSIS — H35731 Hemorrhagic detachment of retinal pigment epithelium, right eye: Secondary | ICD-10-CM | POA: Diagnosis not present

## 2018-09-22 DIAGNOSIS — H353211 Exudative age-related macular degeneration, right eye, with active choroidal neovascularization: Secondary | ICD-10-CM | POA: Diagnosis not present

## 2018-10-05 ENCOUNTER — Other Ambulatory Visit: Payer: Self-pay

## 2018-10-06 ENCOUNTER — Ambulatory Visit (INDEPENDENT_AMBULATORY_CARE_PROVIDER_SITE_OTHER): Payer: Medicare Other

## 2018-10-06 DIAGNOSIS — E538 Deficiency of other specified B group vitamins: Secondary | ICD-10-CM

## 2018-10-14 ENCOUNTER — Other Ambulatory Visit: Payer: Self-pay | Admitting: Family Medicine

## 2018-10-27 DIAGNOSIS — H35352 Cystoid macular degeneration, left eye: Secondary | ICD-10-CM | POA: Diagnosis not present

## 2018-10-27 DIAGNOSIS — H353113 Nonexudative age-related macular degeneration, right eye, advanced atrophic without subfoveal involvement: Secondary | ICD-10-CM | POA: Diagnosis not present

## 2018-10-27 DIAGNOSIS — H35731 Hemorrhagic detachment of retinal pigment epithelium, right eye: Secondary | ICD-10-CM | POA: Diagnosis not present

## 2018-10-27 DIAGNOSIS — H353211 Exudative age-related macular degeneration, right eye, with active choroidal neovascularization: Secondary | ICD-10-CM | POA: Diagnosis not present

## 2018-10-28 DIAGNOSIS — H353211 Exudative age-related macular degeneration, right eye, with active choroidal neovascularization: Secondary | ICD-10-CM | POA: Diagnosis not present

## 2018-10-28 DIAGNOSIS — H35352 Cystoid macular degeneration, left eye: Secondary | ICD-10-CM | POA: Diagnosis not present

## 2018-10-28 DIAGNOSIS — H35731 Hemorrhagic detachment of retinal pigment epithelium, right eye: Secondary | ICD-10-CM | POA: Diagnosis not present

## 2018-10-28 DIAGNOSIS — H353221 Exudative age-related macular degeneration, left eye, with active choroidal neovascularization: Secondary | ICD-10-CM | POA: Diagnosis not present

## 2018-11-05 ENCOUNTER — Other Ambulatory Visit: Payer: Self-pay

## 2018-11-05 ENCOUNTER — Telehealth: Payer: Self-pay

## 2018-11-05 NOTE — Telephone Encounter (Signed)
  Yes, please encourage her to get it.  Truitt Merle MD

## 2018-11-05 NOTE — Telephone Encounter (Signed)
Patient calls wanting to know if Dr. Burr Medico thinks it would be okay for her to get the flu shot this year?  She can get it at her PCP's office.  [phone]

## 2018-11-05 NOTE — Telephone Encounter (Signed)
Spoke with patient informed her okay to get the flu shot per Dr. Burr Medico.

## 2018-11-08 ENCOUNTER — Ambulatory Visit (INDEPENDENT_AMBULATORY_CARE_PROVIDER_SITE_OTHER): Payer: Medicare Other | Admitting: *Deleted

## 2018-11-08 ENCOUNTER — Other Ambulatory Visit: Payer: Self-pay

## 2018-11-08 DIAGNOSIS — E538 Deficiency of other specified B group vitamins: Secondary | ICD-10-CM

## 2018-11-08 NOTE — Progress Notes (Signed)
Pt given Cyanocobalamin inj Tolerated well 

## 2018-11-09 ENCOUNTER — Inpatient Hospital Stay: Payer: Medicare Other | Attending: Hematology

## 2018-11-09 ENCOUNTER — Other Ambulatory Visit: Payer: Self-pay

## 2018-11-09 DIAGNOSIS — D649 Anemia, unspecified: Secondary | ICD-10-CM | POA: Insufficient documentation

## 2018-11-09 DIAGNOSIS — D500 Iron deficiency anemia secondary to blood loss (chronic): Secondary | ICD-10-CM

## 2018-11-09 DIAGNOSIS — Z85038 Personal history of other malignant neoplasm of large intestine: Secondary | ICD-10-CM | POA: Diagnosis not present

## 2018-11-09 DIAGNOSIS — C182 Malignant neoplasm of ascending colon: Secondary | ICD-10-CM

## 2018-11-09 LAB — IRON AND TIBC
Iron: 91 ug/dL (ref 41–142)
Saturation Ratios: 33 % (ref 21–57)
TIBC: 274 ug/dL (ref 236–444)
UIBC: 182 ug/dL (ref 120–384)

## 2018-11-09 LAB — COMPREHENSIVE METABOLIC PANEL
ALT: 13 U/L (ref 0–44)
AST: 17 U/L (ref 15–41)
Albumin: 4.1 g/dL (ref 3.5–5.0)
Alkaline Phosphatase: 67 U/L (ref 38–126)
Anion gap: 10 (ref 5–15)
BUN: 17 mg/dL (ref 8–23)
CO2: 25 mmol/L (ref 22–32)
Calcium: 9.3 mg/dL (ref 8.9–10.3)
Chloride: 107 mmol/L (ref 98–111)
Creatinine, Ser: 0.76 mg/dL (ref 0.44–1.00)
GFR calc Af Amer: >60 mL/min (ref >60)
GFR calc non Af Amer: >60 mL/min (ref >60)
Glucose, Bld: 98 mg/dL (ref 70–99)
Potassium: 4.8 mmol/L (ref 3.5–5.1)
Sodium: 142 mmol/L (ref 135–145)
Total Bilirubin: 0.7 mg/dL (ref 0.3–1.2)
Total Protein: 7 g/dL (ref 6.5–8.1)

## 2018-11-09 LAB — CBC WITH DIFFERENTIAL/PLATELET
Abs Immature Granulocytes: 0.04 10*3/uL (ref 0.00–0.07)
Basophils Absolute: 0.1 10*3/uL (ref 0.0–0.1)
Basophils Relative: 1 %
Eosinophils Absolute: 0.1 10*3/uL (ref 0.0–0.5)
Eosinophils Relative: 1 %
HCT: 45.7 % (ref 36.0–46.0)
Hemoglobin: 14.8 g/dL (ref 12.0–15.0)
Immature Granulocytes: 1 %
Lymphocytes Relative: 19 %
Lymphs Abs: 1.2 10*3/uL (ref 0.7–4.0)
MCH: 29.1 pg (ref 26.0–34.0)
MCHC: 32.4 g/dL (ref 30.0–36.0)
MCV: 89.8 fL (ref 80.0–100.0)
Monocytes Absolute: 0.4 10*3/uL (ref 0.1–1.0)
Monocytes Relative: 6 %
Neutro Abs: 4.5 10*3/uL (ref 1.7–7.7)
Neutrophils Relative %: 72 %
Platelets: 220 10*3/uL (ref 150–400)
RBC: 5.09 MIL/uL (ref 3.87–5.11)
RDW: 12.5 % (ref 11.5–15.5)
WBC: 6.2 10*3/uL (ref 4.0–10.5)
nRBC: 0 % (ref 0.0–0.2)

## 2018-11-09 LAB — FERRITIN: Ferritin: 188 ng/mL (ref 11–307)

## 2018-11-09 LAB — RETICULOCYTES
Immature Retic Fract: 5 % (ref 2.3–15.9)
RBC.: 4.96 MIL/uL (ref 3.87–5.11)
Retic Count, Absolute: 77.9 10*3/uL (ref 19.0–186.0)
Retic Ct Pct: 1.6 % (ref 0.4–3.1)

## 2018-11-09 LAB — CEA (IN HOUSE-CHCC): CEA (CHCC-In House): 4.14 ng/mL (ref 0.00–5.00)

## 2018-11-17 ENCOUNTER — Ambulatory Visit (INDEPENDENT_AMBULATORY_CARE_PROVIDER_SITE_OTHER): Payer: Medicare Other | Admitting: *Deleted

## 2018-11-17 DIAGNOSIS — Z0000 Encounter for general adult medical examination without abnormal findings: Secondary | ICD-10-CM | POA: Diagnosis not present

## 2018-11-17 NOTE — Progress Notes (Signed)
MEDICARE ANNUAL WELLNESS VISIT  11/17/2018  Telephone Visit Disclaimer This Medicare AWV was conducted by telephone due to national recommendations for restrictions regarding the COVID-19 Pandemic (e.g. social distancing).  I verified, using two identifiers, that I am speaking with Ashley Peters or their authorized healthcare agent. I discussed the limitations, risks, security, and privacy concerns of performing an evaluation and management service by telephone and the potential availability of an in-person appointment in the future. The patient expressed understanding and agreed to proceed.   Subjective:  Ashley Peters is a 85 y.o. female patient of Dettinger, [name], MD who had a Medicare Annual Wellness Visit today via telephone. Ashley Peters is Retired and lives alone. she has 3 children. she reports that she is socially active and does interact with friends/family regularly. she is minimally physically active and enjoys quilting and sewing.  Patient Care Team: Dettinger, [name], MD as PCP - General (Family Medicine) Clent Jacks, MD as Consulting Physician (Ophthalmology) Zadie Rhine Clent Demark, MD as Consulting Physician (Ophthalmology) Donnie Mesa, MD as Consulting Physician (General Surgery) Truitt Merle, MD as Consulting Physician (Hematology)  Advanced Directives 11/17/2018 08/06/2017 04/05/2017 02/23/2017 01/19/2017 01/05/2017 12/15/2016  Does Patient Have a Medical Advance Directive? Yes Yes Yes Yes Yes Yes No  Type of Paramedic of [place];Living will [place];Living will [place];Living will [place];Living will Healthcare Power of [place] -  Does patient want to make changes to medical advance directive? No - Patient declined No - Patient declined - - - - -  Copy of [place] in Chart? No - copy requested No - copy requested - No - copy requested Yes No -  copy requested -  Would patient like information on creating a medical advance directive? - - - - - - Yes (ED - Information included in AVS);No - Patient declined    Hospital Utilization Over the Past 12 Months: # of hospitalizations or ER visits: 0 # of surgeries: 0  Review of Systems    Patient reports that her overall health is unchanged compared to last year.  History obtained from chart review  Patient Reported Readings (BP, Pulse, CBG, Weight, etc) none  Pain Assessment Pain : No/denies pain     Current Medications & Allergies (verified) Allergies as of 11/17/2018      Reactions   Benicar [olmesartan Medoxomil]    Elevated Potassium   Amoxicillin Rash   Has patient had a PCN reaction causing immediate rash, facial/tongue/throat swelling, SOB or lightheadedness with hypotension: Yes Has patient had a PCN reaction causing severe rash involving mucus membranes or skin necrosis: No Has patient had a PCN reaction that required hospitalization: No Has patient had a PCN reaction occurring within the last 10 years: No If all of the above answers are "NO", then may proceed with Cephalosporin use.      Medication List       Accurate as of November 17, 2018  8:50 AM. If you have any questions, ask your nurse or doctor.        acetaminophen 500 MG tablet Commonly known as: TYLENOL Take 2 tablets (1,000 mg total) by mouth every 6 (six) hours as needed for moderate pain.   alendronate 70 MG tablet Commonly known as: FOSAMAX TAKE 1 TABLET EVERY 7 DAYS WITH A FULL GLASS OF WATER ON AN EMPTY STOMACH   cholecalciferol 1000 units tablet Commonly known as:  VITAMIN D Take 1,000 Units by mouth daily.   diphenoxylate-atropine 2.5-0.025 MG tablet Commonly known as: LOMOTIL Take 1 tablet 4 (four) times daily as needed by mouth for diarrhea or loose stools.   ferrous sulfate 325 (65 FE) MG tablet Take 1 tablet (325 mg total) by mouth 2 (two) times daily with a meal.    PRESERVISION AREDS 2 PO Take 1 capsule by mouth 2 (two) times daily.       History (reviewed): Past Medical History:  Diagnosis Date  . Anemia   . Blood transfusion without reported diagnosis   . Cancer ([place])   . Colon cancer ([place])   . Diverticulosis   . Family history of breast cancer   . Family history of colon cancer   . Family history of uterine cancer   . GERD (gastroesophageal reflux disease)   . History of kidney stones   . Hyperlipidemia   . Hypertension   . Macular degeneration   . Osteopenia    Past Surgical History:  Procedure Laterality Date  . ABDOMINAL HYSTERECTOMY    . ablation tr Great saphenous vein  10/17/2009  . BREAST EXCISIONAL BIOPSY Bilateral 1971  . brest biopsy  1971  . CHOLECYSTECTOMY    . EPIGASTRIC HERNIA REPAIR N/A 10/14/2016   Procedure: REPAIR OF EPIGASTRIC VENTRAL HERNIA;  Surgeon: Donnie Mesa, MD;  Location: [facility];  Service: General;  Laterality: N/A;  . lt. distal ureteral stone extraction  09/14/1991  . PARTIAL COLECTOMY Right 10/14/2016   Procedure: OPEN RIGHT HEMICOLECTOMY;  Surgeon: Donnie Mesa, MD;  Location: [facility];  Service: General;  Laterality: Right;   Family History  Problem Relation Age of Onset  . Stroke Father 5  . Breast cancer Sister        dx 50's, died at 69  . Seizures Son   . Diabetes Brother   . Kidney disease Brother   . Heart disease Brother   . Epilepsy Son   . Colon cancer Other 61  . Uterine cancer Other 48  . Hodgkin's lymphoma Other    Social History   Socioeconomic History  . Marital status: Widowed    Spouse name: Not on file  . Number of children: 3  . Years of education: 13  . Highest education level: Some college, no degree  Occupational History  . Occupation: Retired    Comment: Network engineer  Social Needs  . Financial resource strain: Not hard at all  . Food insecurity    Worry: Never true    Inability: Never true  . Transportation needs    Medical: No    Non-medical: No  Tobacco  Use  . Smoking status: Never Smoker  . Smokeless tobacco: Never Used  Substance and Sexual Activity  . Alcohol use: No  . Drug use: No  . Sexual activity: Not Currently    Birth control/protection: None  Lifestyle  . Physical activity    Days per week: 2 days    Minutes per session: 60 min  . Stress: Only a little  Relationships  . Social connections    Talks on phone: More than three times a week    Gets together: More than three times a week    Attends religious service: More than 4 times per year    Active member of club or organization: Yes    Attends meetings of clubs or organizations: More than 4 times per year    Relationship status: Widowed  Other Topics Concern  .  Not on file  Social History Narrative  . Not on file    Activities of Daily Living In your present state of health, do you have any difficulty performing the following activities: 11/17/2018  Hearing? N  Vision? N  Comment pt has macular degeneration in right eye, sees the eye doctor once a month for injection  Difficulty concentrating or making decisions? N  Walking or climbing stairs? N  Dressing or bathing? N  Doing errands, shopping? N  Preparing Food and eating ? N  Using the Toilet? N  In the past six months, have you accidently leaked urine? N  Do you have problems with loss of bowel control? N  Managing your Medications? N  Managing your Finances? N  Housekeeping or managing your Housekeeping? N  Some recent data might be hidden    Patient Education/ Literacy How often do you need to have someone help you when you read instructions, pamphlets, or other written materials from your doctor or pharmacy?: 1 - Never What is the last grade level you completed in school?: 12th grade-1 year of [employer]  Exercise Current Exercise Habits: Home exercise routine, Type of exercise: walking, Time (Minutes): 60, Frequency (Times/Week): 2, Weekly Exercise (Minutes/Week): 120, Intensity: Mild,  Exercise limited by: None identified  Diet Patient reports consuming 3 meals a day and 1 snack(s) a day Patient reports that her primary diet is: Regular Patient reports that she does have regular access to food.   Depression Screen PHQ 2/9 Scores 11/17/2018 08/25/2018 09/07/2017 08/06/2017 03/28/2017 03/23/2017 09/19/2016  PHQ - 2 Score 0 0 0 0 0 0 0     Fall Risk Fall Risk  11/17/2018 08/25/2018 09/07/2017 08/06/2017 03/28/2017  Falls in the past year? 0 0 No No No  Number falls in past yr: 0 - - - -  Injury with Fall? 0 - - - -  Follow up Falls prevention discussed - - - -  Comment get rid of all throw rugs in the house, adequate lighting in the walkways and grab bars in the bathroom - - - -     Objective:  Ashley Peters seemed alert and oriented and she participated appropriately during our telephone visit.  Blood Pressure Weight BMI  BP Readings from Last 3 Encounters:  08/25/18 (!) 144/81  03/04/18 (!) 169/83  12/23/17 129/64   Wt Readings from Last 3 Encounters:  08/25/18 138 lb 12.8 oz (63 kg)  03/04/18 136 lb 3.2 oz (61.8 kg)  12/23/17 133 lb (60.3 kg)   BMI Readings from Last 1 Encounters:  08/25/18 30.04 kg/m    *Unable to obtain current vital signs, weight, and BMI due to telephone visit type  Hearing/Vision  . Anniemae did not seem to have difficulty with hearing/understanding during the telephone conversation . Reports that she has had a formal eye exam by an eye care professional within the past year . Reports that she has not had a formal hearing evaluation within the past year *Unable to fully assess hearing and vision during telephone visit type  Cognitive Function: 6CIT Screen 11/17/2018  What Year? 0 points  What month? 0 points  What time? 0 points  Count back from 20 0 points  Months in reverse 4 points  Repeat phrase 0 points  Total Score 4   (Normal:0-7, Significant for Dysfunction: >8)  Normal Cognitive Function Screening: Yes   Immunization & Health  Maintenance Record Immunization History  Administered Date(s) Administered  . DTaP 11/09/2003  .  Influenza Whole 12/10/2009  . Influenza, High Dose Seasonal PF 12/15/2016, 12/07/2017  . Influenza,inj,Quad PF,6+ Mos 12/15/2012, 12/22/2013, 12/13/2014, 12/25/2015  . Pneumococcal Conjugate-13 06/23/2016  . Pneumococcal Polysaccharide-23 03/16/2007  . Tdap 08/06/2017  . Zoster 12/17/2011    Health Maintenance  Topic Date Due  . INFLUENZA VACCINE  10/09/2018  . DEXA SCAN  08/08/2019  . TETANUS/TDAP  08/07/2027  . PNA vac Low Risk Adult  Completed       Assessment  This is a routine wellness examination for Ashley Peters.  Health Maintenance: Due or Overdue Health Maintenance Due  Topic Date Due  . INFLUENZA VACCINE  10/09/2018    Ashley Peters does not need a referral for Community Assistance: Care Management:   no Social Work:    no Prescription Assistance:  no Nutrition/Diabetes Education:  no   Plan:  Personalized Goals Goals Addressed            This Visit's Progress   . DIET - INCREASE WATER INTAKE       Try to drink 6-8 glasses of water daily.      Personalized Health Maintenance & Screening Recommendations  Influenza vaccine  Lung Cancer Screening Recommended: no (Low Dose CT Chest recommended if Age 25-80 years, 30 pack-year currently smoking OR have quit w/in past 15 years) Hepatitis C Screening recommended: no HIV Screening recommended: no  Advanced Directives: Written information was not prepared per patient's request.  Referrals & Orders No orders of the defined types were placed in this encounter.   Follow-up Plan . Follow-up with Dettinger, [name], MD as planned . Bring in a copy of your Advanced Directives for our records . Get your Flu vaccine at your next visit with your PCP or schedule appt with nurse   I have personally reviewed and noted the following in the patient's chart:   . Medical and social history . Use of alcohol,  tobacco or illicit drugs  . Current medications and supplements . Functional ability and status . Nutritional status . Physical activity . Advanced directives . List of other physicians . Hospitalizations, surgeries, and ER visits in previous 12 months . Vitals . Screenings to include cognitive, depression, and falls . Referrals and appointments  In addition, I have reviewed and discussed with Ashley Peters certain preventive protocols, quality metrics, and best practice recommendations. A written personalized care plan for preventive services as well as general preventive health recommendations is available and can be mailed to the patient at her request.      Marylin Crosby, LPN  [phone]

## 2018-11-17 NOTE — Patient Instructions (Signed)
Preventive Care 83 Years and Older, Female Preventive care refers to lifestyle choices and visits with your health care provider that can promote health and wellness. This includes:  A yearly physical exam. This is also called an annual well check.  Regular dental and eye exams.  Immunizations.  Screening for certain conditions.  Healthy lifestyle choices, such as diet and exercise. What can I expect for my preventive care visit? Physical exam Your health care provider will check:  Height and weight. These may be used to calculate body mass index (BMI), which is a measurement that tells if you are at a healthy weight.  Heart rate and blood pressure.  Your skin for abnormal spots. Counseling Your health care provider may ask you questions about:  Alcohol, tobacco, and drug use.  Emotional well-being.  Home and relationship well-being.  Sexual activity.  Eating habits.  History of falls.  Memory and ability to understand (cognition).  Work and work Statistician.  Pregnancy and menstrual history. What immunizations do I need?  Influenza (flu) vaccine  This is recommended every year. Tetanus, diphtheria, and pertussis (Tdap) vaccine  You may need a Td booster every 10 years. Varicella (chickenpox) vaccine  You may need this vaccine if you have not already been vaccinated. Zoster (shingles) vaccine  You may need this after age 83. Pneumococcal conjugate (PCV13) vaccine  One dose is recommended after age 83. Pneumococcal polysaccharide (PPSV23) vaccine  One dose is recommended after age 83. Measles, mumps, and rubella (MMR) vaccine  You may need at least one dose of MMR if you were born in 1957 or later. You may also need a second dose. Meningococcal conjugate (MenACWY) vaccine  You may need this if you have certain conditions. Hepatitis A vaccine  You may need this if you have certain conditions or if you travel or work in places where you may be exposed  to hepatitis A. Hepatitis B vaccine  You may need this if you have certain conditions or if you travel or work in places where you may be exposed to hepatitis B. Haemophilus influenzae type b (Hib) vaccine  You may need this if you have certain conditions. You may receive vaccines as individual doses or as more than one vaccine together in one shot (combination vaccines). Talk with your health care provider about the risks and benefits of combination vaccines. What tests do I need? Blood tests  Lipid and cholesterol levels. These may be checked every 5 years, or more frequently depending on your overall health.  Hepatitis C test.  Hepatitis B test. Screening  Lung cancer screening. You may have this screening every year starting at age 83 if you have a 30-pack-year history of smoking and currently smoke or have quit within the past 15 years.  Colorectal cancer screening. All adults should have this screening starting at age 83 and continuing until age 15. Your health care provider may recommend screening at age 23 if you are at increased risk. You will have tests every 1-10 years, depending on your results and the type of screening test.  Diabetes screening. This is done by checking your blood sugar (glucose) after you have not eaten for a while (fasting). You may have this done every 1-3 years.  Mammogram. This may be done every 1-2 years. Talk with your health care provider about how often you should have regular mammograms.  BRCA-related cancer screening. This may be done if you have a family history of breast, ovarian, tubal, or peritoneal cancers.  Other tests  Sexually transmitted disease (STD) testing.  Bone density scan. This is done to screen for osteoporosis. You may have this done starting at age 83. Follow these instructions at home: Eating and drinking  Eat a diet that includes fresh fruits and vegetables, whole grains, lean protein, and low-fat dairy products. Limit  your intake of foods with high amounts of sugar, saturated fats, and salt.  Take vitamin and mineral supplements as recommended by your health care provider.  Do not drink alcohol if your health care provider tells you not to drink.  If you drink alcohol: ? Limit how much you have to 0-1 drink a day. ? Be aware of how much alcohol is in your drink. In the [place] one drink equals one 12 oz bottle of beer (355 mL), one 5 oz glass of wine (148 mL), or one 1 oz glass of hard liquor (44 mL). Lifestyle  Take daily care of your teeth and gums.  Stay active. Exercise for at least 83 minutes on 5 or more days each week.  Do not use any products that contain nicotine or tobacco, such as cigarettes, e-cigarettes, and chewing tobacco. If you need help quitting, ask your health care provider.  If you are sexually active, practice safe sex. Use a condom or other form of protection in order to prevent STIs (sexually transmitted infections).  Talk with your health care provider about taking a low-dose aspirin or statin. What's next?  Go to your health care provider once a year for a well check visit.  Ask your health care provider how often you should have your eyes and teeth checked.  Stay up to date on all vaccines. This information is not intended to replace advice given to you by your health care provider. Make sure you discuss any questions you have with your health care provider. Document Released: 03/23/2015 Document Revised: 02/18/2018 Document Reviewed: 02/18/2018 Elsevier Patient Education  2020 [employer].

## 2018-11-23 ENCOUNTER — Other Ambulatory Visit: Payer: Self-pay | Admitting: Family Medicine

## 2018-11-23 DIAGNOSIS — Z1231 Encounter for screening mammogram for malignant neoplasm of breast: Secondary | ICD-10-CM

## 2018-12-01 DIAGNOSIS — H353123 Nonexudative age-related macular degeneration, left eye, advanced atrophic without subfoveal involvement: Secondary | ICD-10-CM | POA: Diagnosis not present

## 2018-12-01 DIAGNOSIS — H353221 Exudative age-related macular degeneration, left eye, with active choroidal neovascularization: Secondary | ICD-10-CM | POA: Diagnosis not present

## 2018-12-01 DIAGNOSIS — H35352 Cystoid macular degeneration, left eye: Secondary | ICD-10-CM | POA: Diagnosis not present

## 2018-12-01 DIAGNOSIS — H353211 Exudative age-related macular degeneration, right eye, with active choroidal neovascularization: Secondary | ICD-10-CM | POA: Diagnosis not present

## 2018-12-06 ENCOUNTER — Other Ambulatory Visit: Payer: Self-pay

## 2018-12-07 ENCOUNTER — Ambulatory Visit (INDEPENDENT_AMBULATORY_CARE_PROVIDER_SITE_OTHER): Payer: Medicare Other

## 2018-12-07 ENCOUNTER — Ambulatory Visit: Payer: Medicare Other

## 2018-12-07 DIAGNOSIS — Z23 Encounter for immunization: Secondary | ICD-10-CM | POA: Diagnosis not present

## 2018-12-07 DIAGNOSIS — E538 Deficiency of other specified B group vitamins: Secondary | ICD-10-CM

## 2018-12-07 NOTE — Progress Notes (Signed)
Cyanocobalamin injection given to left deltoid.  Patient tolerated well. 

## 2018-12-08 ENCOUNTER — Ambulatory Visit: Payer: Medicare Other

## 2018-12-08 DIAGNOSIS — H353221 Exudative age-related macular degeneration, left eye, with active choroidal neovascularization: Secondary | ICD-10-CM | POA: Diagnosis not present

## 2018-12-08 DIAGNOSIS — H35352 Cystoid macular degeneration, left eye: Secondary | ICD-10-CM | POA: Diagnosis not present

## 2018-12-08 DIAGNOSIS — H353211 Exudative age-related macular degeneration, right eye, with active choroidal neovascularization: Secondary | ICD-10-CM | POA: Diagnosis not present

## 2018-12-08 DIAGNOSIS — H35731 Hemorrhagic detachment of retinal pigment epithelium, right eye: Secondary | ICD-10-CM | POA: Diagnosis not present

## 2019-01-03 ENCOUNTER — Other Ambulatory Visit: Payer: Self-pay

## 2019-01-04 ENCOUNTER — Ambulatory Visit (INDEPENDENT_AMBULATORY_CARE_PROVIDER_SITE_OTHER): Payer: Medicare Other | Admitting: *Deleted

## 2019-01-04 DIAGNOSIS — E538 Deficiency of other specified B group vitamins: Secondary | ICD-10-CM | POA: Diagnosis not present

## 2019-01-05 DIAGNOSIS — H43812 Vitreous degeneration, left eye: Secondary | ICD-10-CM | POA: Diagnosis not present

## 2019-01-05 DIAGNOSIS — H353221 Exudative age-related macular degeneration, left eye, with active choroidal neovascularization: Secondary | ICD-10-CM | POA: Diagnosis not present

## 2019-01-05 DIAGNOSIS — H35352 Cystoid macular degeneration, left eye: Secondary | ICD-10-CM | POA: Diagnosis not present

## 2019-01-06 ENCOUNTER — Ambulatory Visit
Admission: RE | Admit: 2019-01-06 | Discharge: 2019-01-06 | Disposition: A | Discharge status: Home / Self Care | Payer: Medicare Other | Source: Ambulatory Visit | Attending: Family Medicine | Admitting: Family Medicine

## 2019-01-06 ENCOUNTER — Ambulatory Visit: Payer: Medicare Other

## 2019-01-06 ENCOUNTER — Other Ambulatory Visit: Payer: Self-pay

## 2019-01-06 DIAGNOSIS — Z1231 Encounter for screening mammogram for malignant neoplasm of breast: Secondary | ICD-10-CM | POA: Diagnosis not present

## 2019-01-07 ENCOUNTER — Ambulatory Visit: Payer: Medicare Other

## 2019-01-10 ENCOUNTER — Ambulatory Visit: Payer: Medicare Other

## 2019-01-19 DIAGNOSIS — H353211 Exudative age-related macular degeneration, right eye, with active choroidal neovascularization: Secondary | ICD-10-CM | POA: Diagnosis not present

## 2019-01-19 DIAGNOSIS — H35721 Serous detachment of retinal pigment epithelium, right eye: Secondary | ICD-10-CM | POA: Diagnosis not present

## 2019-01-19 DIAGNOSIS — H353113 Nonexudative age-related macular degeneration, right eye, advanced atrophic without subfoveal involvement: Secondary | ICD-10-CM | POA: Diagnosis not present

## 2019-02-07 ENCOUNTER — Ambulatory Visit (INDEPENDENT_AMBULATORY_CARE_PROVIDER_SITE_OTHER): Payer: Medicare Other

## 2019-02-07 ENCOUNTER — Other Ambulatory Visit: Payer: Self-pay

## 2019-02-07 DIAGNOSIS — E538 Deficiency of other specified B group vitamins: Secondary | ICD-10-CM | POA: Diagnosis not present

## 2019-02-07 NOTE — Progress Notes (Signed)
Cyanocobalamin injection given to left deltoid.  Patient tolerated well. 

## 2019-02-09 DIAGNOSIS — H35352 Cystoid macular degeneration, left eye: Secondary | ICD-10-CM | POA: Diagnosis not present

## 2019-02-09 DIAGNOSIS — H353211 Exudative age-related macular degeneration, right eye, with active choroidal neovascularization: Secondary | ICD-10-CM | POA: Diagnosis not present

## 2019-02-09 DIAGNOSIS — H353221 Exudative age-related macular degeneration, left eye, with active choroidal neovascularization: Secondary | ICD-10-CM | POA: Diagnosis not present

## 2019-02-14 ENCOUNTER — Telehealth: Payer: Self-pay | Admitting: Hematology

## 2019-02-14 NOTE — Telephone Encounter (Signed)
YF PAL moved 12/21 visit to 12/16. Confirmed with patient.

## 2019-02-21 NOTE — Progress Notes (Signed)
[place]   [phone] Fax:([phone]) [phone]   Clinic Follow up Note   Patient Care Team: Dettinger, [name], MD as PCP - General (Family Medicine) Clent Jacks, MD as Consulting Physician (Ophthalmology) Zadie Rhine Clent Demark, MD as Consulting Physician (Ophthalmology) Donnie Mesa, MD as Consulting Physician (General Surgery) Truitt Merle, MD as Consulting Physician (Hematology)  Date of Service:  02/23/2019  CHIEF COMPLAINT: F/u on right colon cancer  SUMMARY OF ONCOLOGIC HISTORY: Oncology History Overview Note  Cancer Staging Cancer of right colon [facility]) Staging form: Colon and Rectum, AJCC 8th Edition - Pathologic stage from 10/14/2016: Stage IIC (pT4b, pN0, cM0) - Signed by Truitt Merle, MD on 11/07/2016     Cancer of right colon ([place])  09/19/2016 Imaging   CT A/P 09/19/16 IMPRESSION: Large 9.5 cm intraluminal mass in the cecum and ascending colon, consistent with colon carcinoma. Tumor extension into adjacent pericolonic fat seen along the lateral wall.  Mild right pericolonic and mesenteric lymphadenopathy, suspicious for metastatic disease. No other sites of metastatic disease identified.  Colonic diverticulosis, without radiographic evidence of diverticulitis. Tiny hiatal hernia, and small epigastric ventral hernia containing only fat.  Aortic atherosclerosis.   10/14/2016 Initial Diagnosis   Cancer of right colon ([place])   10/14/2016 Surgery   OPEN RIGHT HEMICOLECTOMY and REPAIR OF EPIGASTRIC VENTRAL HERNIA by Dr. Georgette Dover and Dr. Dalbert Batman    10/14/2016 Pathology Results   10/14/16 Diagnosis Colon, segmental resection for tumor, Right ADENOCARCINOMA WITH EXTENSIVE EXTRA CELLULAR MUCIN, GRADE 1, (11.0 CM) THE TUMOR INVADES THROUGH THE CECUM WALL INTO ADJACENT TERMINAL ILEUM (PT4B) NINETEEN BENIGN LYMPH NODES (0/19) SUPPURATIVE INFLAMMATION WITH FIBROSIS AND ADHESION TO ABDOMINAL WALL NO ADENOCARCINOMA IDENTIFIED   11/19/2016 Imaging   CT Chest  WO Contrast 11/19/16 IMPRESSION: 1. No evidence of metastatic disease. 2. Aortic atherosclerosis (ICD10-170.0). Coronary artery calcification.   11/25/2016 - 03/24/2017 Chemotherapy   Adjuvant Xeloda two weeks on, one week off starting 11/25/16 for 3 months     12/21/2016 Genetic Testing   Patient had genetic testing due to a personal history of colon cancer and a family history of breast, uterine, and colon cancer. The Common Hereditary Cancers Panel was ordered.  The Hereditary Gene Panel offered by [facility] includes sequencing and/or deletion duplication testing of the following 47 genes: APC, ATM, AXIN2, BARD1, BMPR1A, BRCA1, BRCA2, BRIP1, CDH1, CDKN2A (p14ARF), CDKN2A (p16INK4a), CHEK2, CDK4, CTNNA1, DICER1, EPCAM (Deletion/duplication testing only), GREM1 (promoter region deletion/duplication testing only), KIT, MEN1, MLH1, MSH2, MSH3, MSH6, MUTYH, NBN, NF1, NHTL1, PALB2, PDGFRA, PMS2, POLD1, POLE, PTEN, RAD50, RAD51C, RAD51D, SDHB, SDHC, SDHD, SMAD4, SMARCA4. STK11, TP53, TSC1, TSC2, and VHL.  The following genes were evaluated for sequence changes only: SDHA and HOXB13 c.251G>A variant only.  Results: Negative- no pathogenic variants identified.  The date of this test report is 12/21/2016.    04/27/2017 Imaging   CT AP W Contrast 04/27/17 IMPRESSION: Status post right hemicolectomy. No evidence of recurrent or metastatic disease. Additional stable ancillary findings as above.   08/07/2017 Imaging   08/07/2017 DEXA ASSESSMENT: The BMD measured at Forearm Radius 33% is 0.543 g/cm2 with a T-score of -3.9. This patient is considered osteoporotic according to [facility]) criteria.   11/25/2017 Imaging    11/25/2017 CT CAP IMPRESSION: 1. Abnormal appearance of the right lobe of the liver, with development of progressive peripheral intrahepatic duct dilatation with ill definition of the more central right hepatic duct. This appearance is indeterminate. Considerations include  an incidental primary liver lesion  such as cholangiocarcinoma, an otherwise occult liver metastasis with secondary biliary duct dilatation, or infectious/inflammatory cholangitis. If this 85 year old can undergo MRI/MRCP, this should be considered. Multiphase CT (with delayed phase imaging to evaluate for possible cholangiocarcinoma) and/or focused ultrasound would be alternate imaging strategies. 2. Otherwise, no evidence of metastatic disease in the chest, abdomen, or pelvis. 3. Coronary artery atherosclerosis. Aortic Atherosclerosis (ICD10-I70.0).   12/10/2017 Imaging   12/10/2017 MRI Abdomen IMPRESSION: 1. Stricturing of the central bile ducts of segment V RIGHT hepatic lobe without clear lesion identified. Differential would include benign and malignant stricturing including cholangiocarcinoma. Evaluation is hampered by clip artifact from cholecystectomy clips and duodenum gas. FDG PET scan may or may not be helpful in identify lesion. ERCP would be a second option to evaluate this central obstruction. 2. No enhancing lesion the in the liver parenchyma parenchyma. Altered perfusion related to the biliary obstruction.   12/23/2017 Procedure   12/23/2017 Colonoscopy Impressions: - Decreased sphincter tone found on digital rectal exam. - Two 3 mm polyps in the rectum and in the descending colon, removed with a cold snare. Resected and retrieved. - One 8 mm polyp in the rectum, removed with a hot snare. Resected and retrieved. - Diverticulosis in the entire examined colon. - The examination was otherwise normal on direct and retroflexion views.   08/24/2018 Imaging   CT AP  IMPRESSION: 1. Over the last 9 months there has been a similar not significantly progressive appearance of biliary dilatation and complexity in segment 5 of the liver along with mild intrahepatic biliary dilatation elsewhere, and moderate extrahepatic biliary dilatation. Some of this is likely  postinflammatory; the most complex and dilated bile duct did not have definite enhancement on the recent MRI to definitively indicate cholangiocarcinoma or metastatic lesion but the appearance of the right hepatic lobe is progressive from 09/19/2016 and from 04/27/2017. In the context of the patient's colon cancer, nuclear medicine PET-CT may provide some helpful adjunct information given this unusual appearance. 2. Other imaging findings of potential clinical significance: Mitral calcification. Aortoiliac atherosclerotic vascular disease. Renal cysts. Trace amount of gas in the urinary bladder, query recent catheterization. Descending and sigmoid colon diverticulosis. Two ventral hernias are enlarged compared to the prior exam but lax with wide ostium. No overt pathologic adenopathy currently.      CURRENT THERAPY:  Surveillance  INTERVAL HISTORY:  Ashley Peters is here for a follow up colon cancer. She presents to the clinic with her daughter. She was last seen by me 6 months ago. She notes she is doing well. She notes she stays home most of the time.  She is fine to proceed with COVID-19 vaccine as it has been about 2 years from chemo treatment. She wonders can her son with seizures have vaccine. She has had her flu shot this year.  She notes she is eating well and gaining weight. She notes she feels mildly bloated. Her BMs are regular daily with occasional diarrhea. She denies blood in stool. She notes she sees her PCP twice a year.    REVIEW OF SYSTEMS:   Constitutional: Denies fevers, chills or abnormal weight loss Eyes: Denies blurriness of vision Ears, nose, mouth, throat, and face: Denies mucositis or sore throat Respiratory: Denies cough, dyspnea or wheezes Cardiovascular: Denies palpitation, chest discomfort or lower extremity swelling Gastrointestinal:  Denies nausea, heartburn (+) Mild abdominal bloating  Skin: Denies abnormal skin rashes Lymphatics: Denies new  lymphadenopathy or easy bruising Neurological:Denies numbness, tingling or new weaknesses Behavioral/Psych: Mood  is stable, no new changes  All other systems were reviewed with the patient and are negative.  MEDICAL HISTORY:  Past Medical History:  Diagnosis Date  . Anemia   . Blood transfusion without reported diagnosis   . Cancer ([place])   . Colon cancer ([place])   . Diverticulosis   . Family history of breast cancer   . Family history of colon cancer   . Family history of uterine cancer   . GERD (gastroesophageal reflux disease)   . History of kidney stones   . Hyperlipidemia   . Hypertension   . Macular degeneration   . Osteopenia     SURGICAL HISTORY: Past Surgical History:  Procedure Laterality Date  . ABDOMINAL HYSTERECTOMY    . ablation tr Great saphenous vein  10/17/2009  . BREAST EXCISIONAL BIOPSY Bilateral 1971  . brest biopsy  1971  . CHOLECYSTECTOMY    . EPIGASTRIC HERNIA REPAIR N/A 10/14/2016   Procedure: REPAIR OF EPIGASTRIC VENTRAL HERNIA;  Surgeon: Donnie Mesa, MD;  Location: [place];  Service: General;  Laterality: N/A;  . lt. distal ureteral stone extraction  09/14/1991  . PARTIAL COLECTOMY Right 10/14/2016   Procedure: OPEN RIGHT HEMICOLECTOMY;  Surgeon: Donnie Mesa, MD;  Location: [place];  Service: General;  Laterality: Right;    I have reviewed the social history and family history with the patient and they are unchanged from previous note.  ALLERGIES:  is allergic to benicar [olmesartan medoxomil] and amoxicillin.  MEDICATIONS:  Current Outpatient Medications  Medication Sig Dispense Refill  . acetaminophen (TYLENOL) 500 MG tablet Take 2 tablets (1,000 mg total) by mouth every 6 (six) hours as needed for moderate pain. 30 tablet 0  . alendronate (FOSAMAX) 70 MG tablet TAKE 1 TABLET EVERY 7 DAYS WITH A FULL GLASS OF WATER ON AN EMPTY STOMACH 12 tablet 1  . cholecalciferol (VITAMIN D) 1000 units tablet Take 1,000 Units by mouth daily.    .  diphenoxylate-atropine (LOMOTIL) 2.5-0.025 MG tablet Take 1 tablet 4 (four) times daily as needed by mouth for diarrhea or loose stools. 60 tablet 0  . Multiple Vitamins-Minerals (PRESERVISION AREDS 2 PO) Take 1 capsule by mouth 2 (two) times daily.    . ferrous sulfate 325 (65 FE) MG tablet Take 1 tablet (325 mg total) by mouth 2 (two) times daily with a meal. (Patient not taking: Reported on 02/23/2019) 180 tablet 3   Current Facility-Administered Medications  Medication Dose Route Frequency Provider Last Rate Last Admin  . cyanocobalamin ((VITAMIN B-12)) injection 1,000 mcg  1,000 mcg Intramuscular Q30 days Dettinger, [name], MD   1,000 mcg at 02/07/19 0850    PHYSICAL EXAMINATION: ECOG PERFORMANCE STATUS: 1 - Symptomatic but completely ambulatory  Vitals:   02/23/19 1018  BP: (!) 156/79  Pulse: 84  Resp: 18  Temp: 98.2 F (36.8 C)  SpO2: 98%   Filed Weights   02/23/19 1018  Weight: 138 lb 11.2 oz (62.9 kg)    GENERAL:alert, no distress and comfortable SKIN: skin color, texture, turgor are normal, no rashes or significant lesions EYES: normal, Conjunctiva are pink and non-injected, sclera clear  NECK: supple, thyroid normal size, non-tender, without nodularity LYMPH:  no palpable lymphadenopathy in the cervical, axillary  LUNGS: clear to auscultation and percussion with normal breathing effort HEART: regular rate & rhythm and no murmurs and no lower extremity edema ABDOMEN:abdomen soft, non-tender and normal bowel sounds (+) Surgical incisions healed well with possible small hernia Musculoskeletal:no cyanosis of digits and no  clubbing  NEURO: alert & oriented x 3 with fluent speech, no focal motor/sensory deficits  LABORATORY DATA:  I have reviewed the data as listed CBC Latest Ref Rng & Units 02/23/2019 11/09/2018 08/24/2018  WBC 4.0 - 10.5 K/uL 5.6 6.2 6.4  Hemoglobin 12.0 - 15.0 g/dL 14.4 14.8 14.6  Hematocrit 36.0 - 46.0 % 43.3 45.7 44.2  Platelets 150 - 400 K/uL 186  220 204     CMP Latest Ref Rng & Units 02/23/2019 11/09/2018 08/24/2018  Glucose 70 - 99 mg/dL 99 98 89  BUN 8 - 23 mg/dL '15 17 12  ' Creatinine 0.44 - 1.00 mg/dL 0.75 0.76 0.73  Sodium 135 - 145 mmol/L 142 142 139  Potassium 3.5 - 5.1 mmol/L 4.1 4.8 3.7  Chloride 98 - 111 mmol/L 107 107 105  CO2 22 - 32 mmol/L '27 25 24  ' Calcium 8.9 - 10.3 mg/dL 8.8(L) 9.3 9.0  Total Protein 6.5 - 8.1 g/dL 6.7 7.0 7.2  Total Bilirubin 0.3 - 1.2 mg/dL 0.8 0.7 0.8  Alkaline Phos 38 - 126 U/L 53 67 82  AST 15 - 41 U/L '20 17 22  ' ALT 0 - 44 U/L '17 13 18      ' RADIOGRAPHIC STUDIES: I have personally reviewed the radiological images as listed and agreed with the findings in the report. No results found.   ASSESSMENT & PLAN:  Ashley Peters is a 85 y.o. female with   1. Cancer of the right colon, invasive adenocarcinoma, G1, pT4bN0Mx stage IIA, MSI-stable -Diagnosed in 09/2016. Genetic testing was negative. Treated with right hemicolectomy andadjuvantXeloda. Currently on surveillance.  -She had a colonoscopy on 10/16/2019with a fewpolyps removed. Due to her advanced age, no further surveillance colonoscopy was recommended.  -last CT in 08/2018 showed no evidence of recurrence  -She is clinically stable and doing well. Labs reviewed, stable. Iron and B12 still pending. Physical exam unremarkable. There is no clinical concern for recurrence.  -She is over 2 years since her diagnosis. Her risk of recurrence will decrease significantly after 3 years. Next scan in 08/2019 depending on COVID19. Continue cancer surveillance.  -Virtual Visit in 4-5 months. She will continue to f/u with her PCP in interim twice a year.  -plan to repeat last CT scan in summer 2021   2. Anemiaand B12 deficiency  -She currently receives monthly B12 injections with PCP.  -anemia resolved   3. Borderline DM and HTN -f/u with PCP and continue recommended medications  4. Osteoporosis -Currently on Fosamax, vitamin D and  calcium. Continue. -Last DEXA was in 07/2017 and showed T score of -3.9 at radius forearm -f/u with PCP   Plan -Continue surveillance. -Virtual Visit in 4-5 months  -She will do labs with her PCP in 2-3 months  No problem-specific Assessment & Plan notes found for this encounter.   No orders of the defined types were placed in this encounter.  All questions were answered. The patient knows to call the clinic with any problems, questions or concerns. No barriers to learning was detected. I spent 15 minutes counseling the patient face to face. The total time spent in the appointment was 20 minutes and more than 50% was on counseling and review of test results     Truitt Merle, MD 02/23/2019   I, Joslyn Devon, am acting as scribe for Truitt Merle, MD.   I have reviewed the above documentation for accuracy and completeness, and I agree with the above.

## 2019-02-22 ENCOUNTER — Other Ambulatory Visit: Payer: Self-pay | Admitting: Hematology

## 2019-02-22 DIAGNOSIS — E538 Deficiency of other specified B group vitamins: Secondary | ICD-10-CM

## 2019-02-23 ENCOUNTER — Encounter: Payer: Self-pay | Admitting: Hematology

## 2019-02-23 ENCOUNTER — Other Ambulatory Visit: Payer: Self-pay

## 2019-02-23 ENCOUNTER — Inpatient Hospital Stay: Payer: Medicare Other | Attending: Hematology

## 2019-02-23 ENCOUNTER — Inpatient Hospital Stay (HOSPITAL_BASED_OUTPATIENT_CLINIC_OR_DEPARTMENT_OTHER): Payer: Medicare Other | Admitting: Hematology

## 2019-02-23 VITALS — BP 156/79 | HR 84 | Temp 98.2°F | Resp 18 | Ht <= 58 in | Wt 138.7 lb

## 2019-02-23 DIAGNOSIS — D649 Anemia, unspecified: Secondary | ICD-10-CM | POA: Diagnosis not present

## 2019-02-23 DIAGNOSIS — M810 Age-related osteoporosis without current pathological fracture: Secondary | ICD-10-CM | POA: Diagnosis not present

## 2019-02-23 DIAGNOSIS — C182 Malignant neoplasm of ascending colon: Secondary | ICD-10-CM

## 2019-02-23 DIAGNOSIS — I10 Essential (primary) hypertension: Secondary | ICD-10-CM | POA: Diagnosis not present

## 2019-02-23 DIAGNOSIS — Z85038 Personal history of other malignant neoplasm of large intestine: Secondary | ICD-10-CM | POA: Insufficient documentation

## 2019-02-23 DIAGNOSIS — D500 Iron deficiency anemia secondary to blood loss (chronic): Secondary | ICD-10-CM

## 2019-02-23 DIAGNOSIS — E538 Deficiency of other specified B group vitamins: Secondary | ICD-10-CM

## 2019-02-23 LAB — IRON AND TIBC
Iron: 109 ug/dL (ref 41–142)
Saturation Ratios: 40 % (ref 21–57)
TIBC: 274 ug/dL (ref 236–444)
UIBC: 165 ug/dL (ref 120–384)

## 2019-02-23 LAB — CBC WITH DIFFERENTIAL/PLATELET
Abs Immature Granulocytes: 0.02 10*3/uL (ref 0.00–0.07)
Basophils Absolute: 0.1 10*3/uL (ref 0.0–0.1)
Basophils Relative: 1 %
Eosinophils Absolute: 0.1 10*3/uL (ref 0.0–0.5)
Eosinophils Relative: 1 %
HCT: 43.3 % (ref 36.0–46.0)
Hemoglobin: 14.4 g/dL (ref 12.0–15.0)
Immature Granulocytes: 0 %
Lymphocytes Relative: 17 %
Lymphs Abs: 0.9 10*3/uL (ref 0.7–4.0)
MCH: 30.6 pg (ref 26.0–34.0)
MCHC: 33.3 g/dL (ref 30.0–36.0)
MCV: 92.1 fL (ref 80.0–100.0)
Monocytes Absolute: 0.4 10*3/uL (ref 0.1–1.0)
Monocytes Relative: 6 %
Neutro Abs: 4.2 10*3/uL (ref 1.7–7.7)
Neutrophils Relative %: 75 %
Platelets: 186 10*3/uL (ref 150–400)
RBC: 4.7 MIL/uL (ref 3.87–5.11)
RDW: 12.9 % (ref 11.5–15.5)
WBC: 5.6 10*3/uL (ref 4.0–10.5)
nRBC: 0 % (ref 0.0–0.2)

## 2019-02-23 LAB — COMPREHENSIVE METABOLIC PANEL
ALT: 17 U/L (ref 0–44)
AST: 20 U/L (ref 15–41)
Albumin: 4.1 g/dL (ref 3.5–5.0)
Alkaline Phosphatase: 53 U/L (ref 38–126)
Anion gap: 8 (ref 5–15)
BUN: 15 mg/dL (ref 8–23)
CO2: 27 mmol/L (ref 22–32)
Calcium: 8.8 mg/dL — ABNORMAL LOW (ref 8.9–10.3)
Chloride: 107 mmol/L (ref 98–111)
Creatinine, Ser: 0.75 mg/dL (ref 0.44–1.00)
GFR calc Af Amer: >60 mL/min (ref >60)
GFR calc non Af Amer: >60 mL/min (ref >60)
Glucose, Bld: 99 mg/dL (ref 70–99)
Potassium: 4.1 mmol/L (ref 3.5–5.1)
Sodium: 142 mmol/L (ref 135–145)
Total Bilirubin: 0.8 mg/dL (ref 0.3–1.2)
Total Protein: 6.7 g/dL (ref 6.5–8.1)

## 2019-02-23 LAB — RETICULOCYTES
Immature Retic Fract: 3 % (ref 2.3–15.9)
RBC.: 4.7 MIL/uL (ref 3.87–5.11)
Retic Count, Absolute: 56.9 10*3/uL (ref 19.0–186.0)
Retic Ct Pct: 1.2 % (ref 0.4–3.1)

## 2019-02-23 LAB — VITAMIN B12: Vitamin B-12: 288 pg/mL (ref 180–914)

## 2019-02-23 LAB — FERRITIN: Ferritin: 140 ng/mL (ref 11–307)

## 2019-02-23 LAB — CEA (IN HOUSE-CHCC): CEA (CHCC-In House): 4.16 ng/mL (ref 0.00–5.00)

## 2019-02-24 ENCOUNTER — Telehealth: Payer: Self-pay | Admitting: Hematology

## 2019-02-24 ENCOUNTER — Ambulatory Visit: Payer: Medicare Other | Admitting: Hematology

## 2019-02-24 ENCOUNTER — Other Ambulatory Visit: Payer: Medicare Other

## 2019-02-24 NOTE — Telephone Encounter (Signed)
Scheduled appt per 12/16 los.  Spoke with pt and she is aware of the appt date and time.

## 2019-02-28 ENCOUNTER — Telehealth: Payer: Self-pay

## 2019-02-28 ENCOUNTER — Ambulatory Visit: Payer: Medicare Other | Admitting: Hematology

## 2019-02-28 ENCOUNTER — Other Ambulatory Visit: Payer: Medicare Other

## 2019-02-28 NOTE — Telephone Encounter (Signed)
Spoke with patient regarding lab results.  Per Dr. Burr Medico notified patient all with WNL, no concerns.  Patient verbalized an understanding.

## 2019-02-28 NOTE — Telephone Encounter (Signed)
-----   Message from Truitt Merle, MD sent at 02/28/2019 10:55 AM EST ----- Please let her lab results, all WNL, no concerns, thanks   Truitt Merle  02/28/2019

## 2019-03-09 ENCOUNTER — Ambulatory Visit: Payer: Medicare Other

## 2019-03-14 ENCOUNTER — Other Ambulatory Visit: Payer: Self-pay

## 2019-03-14 ENCOUNTER — Ambulatory Visit (INDEPENDENT_AMBULATORY_CARE_PROVIDER_SITE_OTHER): Payer: Medicare Other | Admitting: *Deleted

## 2019-03-14 DIAGNOSIS — E538 Deficiency of other specified B group vitamins: Secondary | ICD-10-CM

## 2019-03-14 NOTE — Progress Notes (Signed)
Vitamin b12 injection given and patient tolerated well.  

## 2019-03-16 DIAGNOSIS — H35352 Cystoid macular degeneration, left eye: Secondary | ICD-10-CM | POA: Diagnosis not present

## 2019-03-16 DIAGNOSIS — H43822 Vitreomacular adhesion, left eye: Secondary | ICD-10-CM | POA: Diagnosis not present

## 2019-03-16 DIAGNOSIS — H353221 Exudative age-related macular degeneration, left eye, with active choroidal neovascularization: Secondary | ICD-10-CM | POA: Diagnosis not present

## 2019-03-31 ENCOUNTER — Other Ambulatory Visit: Payer: Self-pay | Admitting: Family Medicine

## 2019-03-31 NOTE — Telephone Encounter (Signed)
Attempted to contact patient - NA °

## 2019-03-31 NOTE — Telephone Encounter (Signed)
ntbs  Last OV 08/25/18 No future appt scheduled Didn't send refill to mail in pharmacy

## 2019-04-06 ENCOUNTER — Ambulatory Visit: Payer: Medicare Other

## 2019-04-14 ENCOUNTER — Ambulatory Visit (INDEPENDENT_AMBULATORY_CARE_PROVIDER_SITE_OTHER): Payer: Medicare Other

## 2019-04-14 ENCOUNTER — Other Ambulatory Visit: Payer: Self-pay

## 2019-04-14 DIAGNOSIS — E538 Deficiency of other specified B group vitamins: Secondary | ICD-10-CM | POA: Diagnosis not present

## 2019-04-16 ENCOUNTER — Ambulatory Visit: Payer: Medicare Other | Attending: Internal Medicine

## 2019-04-16 DIAGNOSIS — Z23 Encounter for immunization: Secondary | ICD-10-CM | POA: Insufficient documentation

## 2019-04-16 NOTE — Progress Notes (Signed)
   Covid-19 Vaccination Clinic  Name:  ZYKEIA NASLUND    MRN: [id number] DOB: [birth year]  04/16/2019  Ms. Odegaard was observed post Covid-19 immunization for 15 minutes without incidence. She was provided with Vaccine Information Sheet and instruction to access the V-Safe system.   Ms. Hanif was instructed to call 911 with any severe reactions post vaccine: Marland Kitchen Difficulty breathing  . Swelling of your face and throat  . A fast heartbeat  . A bad rash all over your body  . Dizziness and weakness    Immunizations Administered    Name Date Dose VIS Date Route   Pfizer COVID-19 Vaccine 04/16/2019  8:20 AM 0.3 mL 02/18/2019 Intramuscular   Manufacturer: Luis Lopez   Lot: [id number]   [place]: [id number]

## 2019-04-19 DIAGNOSIS — H353221 Exudative age-related macular degeneration, left eye, with active choroidal neovascularization: Secondary | ICD-10-CM | POA: Diagnosis not present

## 2019-04-19 DIAGNOSIS — H353211 Exudative age-related macular degeneration, right eye, with active choroidal neovascularization: Secondary | ICD-10-CM | POA: Diagnosis not present

## 2019-04-19 DIAGNOSIS — H35352 Cystoid macular degeneration, left eye: Secondary | ICD-10-CM | POA: Diagnosis not present

## 2019-04-19 DIAGNOSIS — H43822 Vitreomacular adhesion, left eye: Secondary | ICD-10-CM | POA: Diagnosis not present

## 2019-04-27 ENCOUNTER — Ambulatory Visit: Payer: Medicare Other

## 2019-05-05 DIAGNOSIS — H353113 Nonexudative age-related macular degeneration, right eye, advanced atrophic without subfoveal involvement: Secondary | ICD-10-CM | POA: Diagnosis not present

## 2019-05-05 DIAGNOSIS — H43811 Vitreous degeneration, right eye: Secondary | ICD-10-CM | POA: Diagnosis not present

## 2019-05-05 DIAGNOSIS — H353211 Exudative age-related macular degeneration, right eye, with active choroidal neovascularization: Secondary | ICD-10-CM | POA: Diagnosis not present

## 2019-05-10 ENCOUNTER — Ambulatory Visit: Payer: Medicare Other

## 2019-05-10 ENCOUNTER — Ambulatory Visit: Payer: Medicare Other | Attending: Internal Medicine

## 2019-05-10 DIAGNOSIS — Z23 Encounter for immunization: Secondary | ICD-10-CM

## 2019-05-10 NOTE — Progress Notes (Signed)
   Covid-19 Vaccination Clinic  Name:  Ashley Peters    MRN: [id number] DOB: 1928-07-22  05/10/2019  [name] was observed post Covid-19 immunization for 15 minutes without incident. She was provided with Vaccine Information Sheet and instruction to access the V-Safe system.   [name] was instructed to call 911 with any severe reactions post vaccine: Marland Kitchen Difficulty breathing  . Swelling of face and throat  . A fast heartbeat  . A bad rash all over body  . Dizziness and weakness   Immunizations Administered    Name Date Dose VIS Date Route   Pfizer COVID-19 Vaccine 05/10/2019  4:51 PM 0.3 mL 02/18/2019 Intramuscular   Manufacturer: [place]   Lot: [id number]   [place]: [id number]

## 2019-05-13 ENCOUNTER — Other Ambulatory Visit: Payer: Self-pay

## 2019-05-13 ENCOUNTER — Ambulatory Visit (INDEPENDENT_AMBULATORY_CARE_PROVIDER_SITE_OTHER): Payer: Medicare Other | Admitting: *Deleted

## 2019-05-13 DIAGNOSIS — E538 Deficiency of other specified B group vitamins: Secondary | ICD-10-CM | POA: Diagnosis not present

## 2019-05-13 NOTE — Progress Notes (Signed)
B12 injection given IM right deltoid and pt tolerated well.

## 2019-05-13 NOTE — Patient Instructions (Signed)

## 2019-05-24 DIAGNOSIS — H353123 Nonexudative age-related macular degeneration, left eye, advanced atrophic without subfoveal involvement: Secondary | ICD-10-CM | POA: Diagnosis not present

## 2019-05-24 DIAGNOSIS — H353221 Exudative age-related macular degeneration, left eye, with active choroidal neovascularization: Secondary | ICD-10-CM | POA: Diagnosis not present

## 2019-05-24 DIAGNOSIS — H43822 Vitreomacular adhesion, left eye: Secondary | ICD-10-CM | POA: Diagnosis not present

## 2019-05-24 DIAGNOSIS — H35352 Cystoid macular degeneration, left eye: Secondary | ICD-10-CM | POA: Diagnosis not present

## 2019-06-14 ENCOUNTER — Other Ambulatory Visit: Payer: Self-pay

## 2019-06-14 ENCOUNTER — Ambulatory Visit (INDEPENDENT_AMBULATORY_CARE_PROVIDER_SITE_OTHER): Payer: Medicare Other | Admitting: *Deleted

## 2019-06-14 DIAGNOSIS — E538 Deficiency of other specified B group vitamins: Secondary | ICD-10-CM

## 2019-06-14 NOTE — Progress Notes (Signed)
B12 given IM left deltoid. Patient tolerated well.

## 2019-06-20 DIAGNOSIS — Z961 Presence of intraocular lens: Secondary | ICD-10-CM | POA: Diagnosis not present

## 2019-06-20 DIAGNOSIS — H353211 Exudative age-related macular degeneration, right eye, with active choroidal neovascularization: Secondary | ICD-10-CM | POA: Diagnosis not present

## 2019-06-20 DIAGNOSIS — H04123 Dry eye syndrome of bilateral lacrimal glands: Secondary | ICD-10-CM | POA: Diagnosis not present

## 2019-07-05 ENCOUNTER — Ambulatory Visit (INDEPENDENT_AMBULATORY_CARE_PROVIDER_SITE_OTHER): Payer: Medicare Other | Admitting: Ophthalmology

## 2019-07-05 ENCOUNTER — Other Ambulatory Visit: Payer: Self-pay

## 2019-07-05 ENCOUNTER — Encounter (INDEPENDENT_AMBULATORY_CARE_PROVIDER_SITE_OTHER): Payer: Self-pay | Admitting: Ophthalmology

## 2019-07-05 DIAGNOSIS — H35352 Cystoid macular degeneration, left eye: Secondary | ICD-10-CM | POA: Insufficient documentation

## 2019-07-05 DIAGNOSIS — H353221 Exudative age-related macular degeneration, left eye, with active choroidal neovascularization: Secondary | ICD-10-CM | POA: Diagnosis not present

## 2019-07-05 DIAGNOSIS — H353211 Exudative age-related macular degeneration, right eye, with active choroidal neovascularization: Secondary | ICD-10-CM

## 2019-07-05 DIAGNOSIS — H353212 Exudative age-related macular degeneration, right eye, with inactive choroidal neovascularization: Secondary | ICD-10-CM | POA: Insufficient documentation

## 2019-07-05 DIAGNOSIS — H43822 Vitreomacular adhesion, left eye: Secondary | ICD-10-CM | POA: Insufficient documentation

## 2019-07-05 DIAGNOSIS — H353222 Exudative age-related macular degeneration, left eye, with inactive choroidal neovascularization: Secondary | ICD-10-CM | POA: Insufficient documentation

## 2019-07-05 HISTORY — DX: Cystoid macular degeneration, left eye: H35.352

## 2019-07-05 MED ORDER — BEVACIZUMAB CHEMO INJECTION 1.25MG/0.05ML SYRINGE FOR KALEIDOSCOPE
1.2500 mg | INTRAVITREAL | Status: AC | PRN
  Administered 2019-07-05: 1.25 mg via INTRAVITREAL

## 2019-07-05 NOTE — Assessment & Plan Note (Signed)
Vitreomacular traction may cause vision loss from anatomic distortion to the center of the vision, the macula.  If visual function is symptomatic or threatened, therapy may be needed.  Surgical intervention offers the highest chance of visual stability and improvement.  Distortion of the macula anatomy may cause splitting of the retinal layers, termed foveomacular retinoschisis, which can cause more permanent vision loss.  Epiretinal membranes may also be associated.  Macular hole may also develop if vitreomacular traction progresses. The minor form of this condition is Vitreomacular adhesion, which is a natural change in the aging process of the eye, which requires observation only. 

## 2019-07-05 NOTE — Progress Notes (Signed)
07/05/2019     CHIEF COMPLAINT Patient presents for Macular Degeneration and Retina Follow Up   HISTORY OF PRESENT ILLNESS: Ashley Peters is a 86 y.o. female who presents to the clinic today for:   HPI    Retina Follow Up    Patient presents with  Wet AMD.  In left eye.  Duration of 6 weeks.  Since onset it is stable.          Comments    6 week follow up- OCT OU, Possible Avastin OS Patient denies change in vision and overall has no complaints.        Last edited by Hurman Horn, MD on 07/05/2019  9:50 AM. (History)      Referring physician: Dettinger, [name], MD [place],  [place] [id number]  HISTORICAL INFORMATION:   Selected notes from the MEDICAL RECORD NUMBER    Lab Results  Component Value Date   HGBA1C 5.7 02/04/2016     CURRENT MEDICATIONS: No current outpatient medications on file. (Ophthalmic Drugs)   No current facility-administered medications for this visit. (Ophthalmic Drugs)   Current Outpatient Medications (Other)  Medication Sig  . acetaminophen (TYLENOL) 500 MG tablet Take 2 tablets (1,000 mg total) by mouth every 6 (six) hours as needed for moderate pain.  Marland Kitchen alendronate (FOSAMAX) 70 MG tablet TAKE 1 TABLET EVERY 7 DAYS WITH A FULL GLASS OF WATER ON AN EMPTY STOMACH  . cholecalciferol (VITAMIN D) 1000 units tablet Take 1,000 Units by mouth daily.  . diphenoxylate-atropine (LOMOTIL) 2.5-0.025 MG tablet Take 1 tablet 4 (four) times daily as needed by mouth for diarrhea or loose stools.  . ferrous sulfate 325 (65 FE) MG tablet Take 1 tablet (325 mg total) by mouth 2 (two) times daily with a meal. (Patient not taking: Reported on 02/23/2019)  . Multiple Vitamins-Minerals (PRESERVISION AREDS 2 PO) Take 1 capsule by mouth 2 (two) times daily.   Current Facility-Administered Medications (Other)  Medication Route  . cyanocobalamin ((VITAMIN B-12)) injection 1,000 mcg Intramuscular      REVIEW OF SYSTEMS:    ALLERGIES Allergies    Allergen Reactions  . Benicar [Olmesartan Medoxomil]     Elevated Potassium  . Amoxicillin Rash    Has patient had a PCN reaction causing immediate rash, facial/tongue/throat swelling, SOB or lightheadedness with hypotension: Yes Has patient had a PCN reaction causing severe rash involving mucus membranes or skin necrosis: No Has patient had a PCN reaction that required hospitalization: No Has patient had a PCN reaction occurring within the last 10 years: No If all of the above answers are "NO", then may proceed with Cephalosporin use.    PAST MEDICAL HISTORY Past Medical History:  Diagnosis Date  . Anemia   . Blood transfusion without reported diagnosis   . Cancer ([facility])   . Colon cancer ([facility])   . Diverticulosis   . Family history of breast cancer   . Family history of colon cancer   . Family history of uterine cancer   . GERD (gastroesophageal reflux disease)   . History of kidney stones   . Hyperlipidemia   . Hypertension   . Macular degeneration   . Osteopenia    Past Surgical History:  Procedure Laterality Date  . ABDOMINAL HYSTERECTOMY    . ablation tr Great saphenous vein  10/17/2009  . BREAST EXCISIONAL BIOPSY Bilateral 1971  . brest biopsy  1971  . CHOLECYSTECTOMY    . EPIGASTRIC HERNIA REPAIR N/A  10/14/2016   Procedure: REPAIR OF EPIGASTRIC VENTRAL HERNIA;  Surgeon: Donnie Mesa, MD;  Location: [facility];  Service: General;  Laterality: N/A;  . lt. distal ureteral stone extraction  09/14/1991  . PARTIAL COLECTOMY Right 10/14/2016   Procedure: OPEN RIGHT HEMICOLECTOMY;  Surgeon: Donnie Mesa, MD;  Location: [facility];  Service: General;  Laterality: Right;    FAMILY HISTORY Family History  Problem Relation Age of Onset  . Stroke Father 61  . Breast cancer Sister        dx 58's, died at 34  . Seizures Son   . Diabetes Brother   . Kidney disease Brother   . Heart disease Brother   . Epilepsy Son   . Colon cancer Other 59  . Uterine cancer Other 41  . Hodgkin's  lymphoma Other     SOCIAL HISTORY Social History   Tobacco Use  . Smoking status: Never Smoker  . Smokeless tobacco: Never Used  Substance Use Topics  . Alcohol use: No  . Drug use: No         OPHTHALMIC EXAM:  Base Eye Exam    Visual Acuity (Snellen - Linear)      Right Left   Dist cc 20/200-1 20/100-1   Dist ph cc NI 20/100+2   Correction: Glasses       Tonometry (Tonopen, 8:43 AM)      Right Left   Pressure 13 14       Pupils      Pupils Dark Light Shape React APD   Right PERRL 5 4 Round Brisk None   Left PERRL 5 4 Round Brisk None       Visual Fields (Counting fingers)      Left Right    Full Full       Extraocular Movement      Right Left    Full Full       Neuro/Psych    Oriented x3: Yes   Mood/Affect: Normal       Dilation    Left eye: 1.0% Mydriacyl, 2.5% Phenylephrine @ 8:43 AM        Slit Lamp and Fundus Exam    External Exam      Right Left   External Normal Normal       Slit Lamp Exam      Right Left   Lids/Lashes Normal Normal   Conjunctiva/Sclera White and quiet White and quiet   Cornea Clear Clear   Anterior Chamber Deep and quiet Deep and quiet   Iris Round and reactive Round and reactive   Lens Posterior chamber intraocular lens Posterior chamber intraocular lens   Vitreous Normal Normal          IMAGING AND PROCEDURES  Imaging and Procedures for 07/05/19  OCT, Retina - OU - Both Eyes       Right Eye Quality was good. Scan locations included subfoveal. Central Foveal Thickness: 306. Progression has improved. Findings include abnormal foveal contour, inner retinal atrophy, outer retinal atrophy, vitreous traction.   Left Eye Quality was good. Scan locations included subfoveal. Central Foveal Thickness: 258. Progression has improved. Findings include abnormal foveal contour, outer retinal tubulation.   Notes OS stable at 6 week interval, repeat Avastin today       Intravitreal Injection, Pharmacologic Agent  - OS - Left Eye       Time Out 07/05/2019. 9:52 AM. Confirmed correct patient, procedure, site, and patient consented.   Anesthesia Topical anesthesia was used.  Anesthetic medications included Akten 3.5%.   Procedure Preparation included Tobramycin 0.3%, Ofloxacin , 10% betadine to eyelids. A 30 gauge needle was used.   Injection:  1.25 mg Bevacizumab (AVASTIN) SOLN   NDC: [id number], [id number]   Route: Intravitreal, Site: Left Eye, Waste: 0 mg  Post-op Post injection exam found visual acuity of at least counting fingers. The patient tolerated the procedure well. There were no complications. The patient received written and verbal post procedure care education. Post injection medications were not given.                 ASSESSMENT/PLAN:  Exudative age-related macular degeneration of left eye with active choroidal neovascularization (HCC) The nature of wet macular degeneration was discussed with the patient.  Forms of therapy reviewed include the use of Anti-VEGF medications injected painlessly into the eye, as well as other possible treatment modalities, including thermal laser therapy. Fellow eye involvement and risks were discussed with the patient. Upon the finding of wet age related macular degeneration, treatment will be offered. The treatment regimen is on a treat as needed basis with the intent to treat if necessary and extend interval of exams when possible. On average 1 out of 6 patients do not need lifetime therapy. However, the risk of recurrent disease is high for a lifetime.  Initially monthly, then periodic, examinations and evaluations will determine whether the next treatment is required on the day of the examination.  OS remained stable and improved on 6-week evaluation  Vitreomacular adhesion of left eye Vitreomacular traction may cause vision loss from anatomic distortion to the center of the vision, the macula.  If visual function is symptomatic or threatened,  therapy may be needed.  Surgical intervention offers the highest chance of visual stability and improvement.  Distortion of the macula anatomy may cause splitting of the retinal layers, termed foveomacular retinoschisis, which can cause more permanent vision loss.  Epiretinal membranes may also be associated.  Macular hole may also develop if vitreomacular traction progresses. The minor form of this condition is Vitreomacular adhesion, which is a natural change in the aging process of the eye, which requires observation only.      ICD-10-CM   1. Exudative age-related macular degeneration of left eye with active choroidal neovascularization (HCC)  H35.3221 OCT, Retina - OU - Both Eyes    Intravitreal Injection, Pharmacologic Agent - OS - Left Eye    Bevacizumab (AVASTIN) SOLN 1.25 mg  2. Exudative age-related macular degeneration of right eye with active choroidal neovascularization ([facility])  H35.3211   3. Vitreomacular adhesion of left eye  H43.822   4. Cystoid macular edema of left eye  H35.352     1.OS stable at 6 week interval, repeat Avastin today  2.  OD follow-up examination as scheduled, May 2021  3.  Ophthalmic Meds Ordered this visit:  Meds ordered this encounter  Medications  . Bevacizumab (AVASTIN) SOLN 1.25 mg       Return in about 6 weeks (around 08/16/2019) for AVASTIN OCT, OS.  Patient Instructions  Patient is to monitor her vision and notify us if new onset distortions develop    Explained the diagnoses, plan, and follow up with the patient and they expressed understanding.  Patient expressed understanding of the importance of proper follow up care.   Clent Demark Thanvi Blincoe M.D. Diseases & Surgery of the Retina and Vitreous [facility] [place] 07/05/19     Abbreviations: M myopia (nearsighted); A astigmatism; H hyperopia (farsighted); P  presbyopia; Mrx spectacle prescription;  CTL contact lenses; OD right eye; OS left eye; OU both eyes  XT exotropia; ET  esotropia; PEK punctate epithelial keratitis; PEE punctate epithelial erosions; DES dry eye syndrome; MGD meibomian gland dysfunction; ATs artificial tears; PFAT's preservative free artificial tears; [place] nuclear sclerotic cataract; PSC posterior subcapsular cataract; ERM epi-retinal membrane; PVD posterior vitreous detachment; RD retinal detachment; DM diabetes mellitus; DR diabetic retinopathy; NPDR non-proliferative diabetic retinopathy; PDR proliferative diabetic retinopathy; CSME clinically significant macular edema; DME diabetic macular edema; dbh dot blot hemorrhages; CWS cotton wool spot; POAG primary open angle glaucoma; C/D cup-to-disc ratio; HVF humphrey visual field; GVF goldmann visual field; OCT optical coherence tomography; IOP intraocular pressure; BRVO Branch retinal vein occlusion; CRVO central retinal vein occlusion; CRAO central retinal artery occlusion; BRAO branch retinal artery occlusion; RT retinal tear; SB scleral buckle; PPV pars plana vitrectomy; VH Vitreous hemorrhage; PRP panretinal laser photocoagulation; IVK intravitreal kenalog; VMT vitreomacular traction; MH Macular hole;  NVD neovascularization of the disc; NVE neovascularization elsewhere; AREDS age related eye disease study; ARMD age related macular degeneration; POAG primary open angle glaucoma; EBMD epithelial/anterior basement membrane dystrophy; ACIOL anterior chamber intraocular lens; IOL intraocular lens; PCIOL posterior chamber intraocular lens; Phaco/IOL phacoemulsification with intraocular lens placement; [place] photorefractive keratectomy; LASIK laser assisted in situ keratomileusis; HTN hypertension; DM diabetes mellitus; COPD chronic obstructive pulmonary disease

## 2019-07-05 NOTE — Patient Instructions (Signed)
Patient is to monitor her vision and notify us if new onset distortions develop

## 2019-07-05 NOTE — Assessment & Plan Note (Signed)
The nature of wet macular degeneration was discussed with the patient.  Forms of therapy reviewed include the use of Anti-VEGF medications injected painlessly into the eye, as well as other possible treatment modalities, including thermal laser therapy. Fellow eye involvement and risks were discussed with the patient. Upon the finding of wet age related macular degeneration, treatment will be offered. The treatment regimen is on a treat as needed basis with the intent to treat if necessary and extend interval of exams when possible. On average 1 out of 6 patients do not need lifetime therapy. However, the risk of recurrent disease is high for a lifetime.  Initially monthly, then periodic, examinations and evaluations will determine whether the next treatment is required on the day of the examination.  OS remained stable and improved on 6-week evaluation

## 2019-07-19 ENCOUNTER — Ambulatory Visit (INDEPENDENT_AMBULATORY_CARE_PROVIDER_SITE_OTHER): Payer: Medicare Other | Admitting: *Deleted

## 2019-07-19 ENCOUNTER — Other Ambulatory Visit: Payer: Self-pay

## 2019-07-19 ENCOUNTER — Other Ambulatory Visit: Payer: Self-pay | Admitting: Family Medicine

## 2019-07-19 DIAGNOSIS — E538 Deficiency of other specified B group vitamins: Secondary | ICD-10-CM

## 2019-07-19 MED ORDER — ALENDRONATE SODIUM 70 MG PO TABS: ORAL_TABLET | ORAL | 0 refills | Status: DC

## 2019-07-19 NOTE — Telephone Encounter (Signed)
LMOVM refill sent to pharmacy and appt made for the first available on6/14/21 at 12:55 pm, if this appt is not convenient to please call back to reschedule

## 2019-07-19 NOTE — Telephone Encounter (Signed)
  Prescription Request  07/19/2019  What is the name of the medication or equipment? Alendronate Sodium tablets 70 mg  Have you contacted your pharmacy to request a refill? (if applicable) NO  Which pharmacy would you like this sent to? [facility]   Patient notified that their request is being sent to the clinical staff for review and that they should receive a response within 2 business days.

## 2019-07-21 NOTE — Progress Notes (Signed)
[place]   [phone] Fax:([phone]) [phone]   Clinic Follow up Note   Patient Care Team: Dettinger, [name], MD as PCP - General (Family Medicine) Clent Jacks, MD as Consulting Physician (Ophthalmology) Zadie Rhine Clent Demark, MD as Consulting Physician (Ophthalmology) Donnie Mesa, MD as Consulting Physician (General Surgery) Truitt Merle, MD as Consulting Physician (Hematology)   I connected with Ashley Peters on 07/25/2019 at  8:20 AM EDT by telephone visit and verified that I am speaking with the correct person using two identifiers.  I discussed the limitations, risks, security and privacy concerns of performing an evaluation and management service by telephone and the availability of in person appointments. I also discussed with the patient that there may be a patient responsible charge related to this service. The patient expressed understanding and agreed to proceed.   Other persons participating in the visit and their role in the encounter:  Daughter  Patient's location:  Her home Provider's location:  My Office   CHIEF COMPLAINT: F/u on right colon cancer  SUMMARY OF ONCOLOGIC HISTORY: Oncology History Overview Note  Cancer Staging Cancer of right colon [facility]) Staging form: Colon and Rectum, AJCC 8th Edition - Pathologic stage from 10/14/2016: Stage IIC (pT4b, pN0, cM0) - Signed by Truitt Merle, MD on 11/07/2016     Cancer of right colon ([place])  09/19/2016 Imaging   CT A/P 09/19/16 IMPRESSION: Large 9.5 cm intraluminal mass in the cecum and ascending colon, consistent with colon carcinoma. Tumor extension into adjacent pericolonic fat seen along the lateral wall.  Mild right pericolonic and mesenteric lymphadenopathy, suspicious for metastatic disease. No other sites of metastatic disease identified.  Colonic diverticulosis, without radiographic evidence of diverticulitis. Tiny hiatal hernia, and small epigastric ventral hernia containing only  fat.  Aortic atherosclerosis.   10/14/2016 Initial Diagnosis   Cancer of right colon ([place])   10/14/2016 Surgery   OPEN RIGHT HEMICOLECTOMY and REPAIR OF EPIGASTRIC VENTRAL HERNIA by Dr. Georgette Dover and Dr. Dalbert Batman    10/14/2016 Pathology Results   10/14/16 Diagnosis Colon, segmental resection for tumor, Right ADENOCARCINOMA WITH EXTENSIVE EXTRA CELLULAR MUCIN, GRADE 1, (11.0 CM) THE TUMOR INVADES THROUGH THE CECUM WALL INTO ADJACENT TERMINAL ILEUM (PT4B) NINETEEN BENIGN LYMPH NODES (0/19) SUPPURATIVE INFLAMMATION WITH FIBROSIS AND ADHESION TO ABDOMINAL WALL NO ADENOCARCINOMA IDENTIFIED   11/19/2016 Imaging   CT Chest WO Contrast 11/19/16 IMPRESSION: 1. No evidence of metastatic disease. 2. Aortic atherosclerosis (ICD10-170.0). Coronary artery calcification.   11/25/2016 - 03/24/2017 Chemotherapy   Adjuvant Xeloda two weeks on, one week off starting 11/25/16 for 3 months     12/21/2016 Genetic Testing   Patient had genetic testing due to a personal history of colon cancer and a family history of breast, uterine, and colon cancer. The Common Hereditary Cancers Panel was ordered.  The Hereditary Gene Panel offered by [facility] includes sequencing and/or deletion duplication testing of the following 47 genes: APC, ATM, AXIN2, BARD1, BMPR1A, BRCA1, BRCA2, BRIP1, CDH1, CDKN2A (p14ARF), CDKN2A (p16INK4a), CHEK2, CDK4, CTNNA1, DICER1, EPCAM (Deletion/duplication testing only), GREM1 (promoter region deletion/duplication testing only), KIT, MEN1, MLH1, MSH2, MSH3, MSH6, MUTYH, NBN, NF1, NHTL1, PALB2, PDGFRA, PMS2, POLD1, POLE, PTEN, RAD50, RAD51C, RAD51D, SDHB, SDHC, SDHD, SMAD4, SMARCA4. STK11, TP53, TSC1, TSC2, and VHL.  The following genes were evaluated for sequence changes only: SDHA and HOXB13 c.251G>A variant only.  Results: Negative- no pathogenic variants identified.  The date of this test report is 12/21/2016.    04/27/2017 Imaging   CT AP W Contrast 04/27/17  IMPRESSION: Status post right  hemicolectomy. No evidence of recurrent or metastatic disease. Additional stable ancillary findings as above.   08/07/2017 Imaging   08/07/2017 DEXA ASSESSMENT: The BMD measured at Forearm Radius 33% is 0.543 g/cm2 with a T-score of -3.9. This patient is considered osteoporotic according to [facility]) criteria.   11/25/2017 Imaging    11/25/2017 CT CAP IMPRESSION: 1. Abnormal appearance of the right lobe of the liver, with development of progressive peripheral intrahepatic duct dilatation with ill definition of the more central right hepatic duct. This appearance is indeterminate. Considerations include an incidental primary liver lesion such as cholangiocarcinoma, an otherwise occult liver metastasis with secondary biliary duct dilatation, or infectious/inflammatory cholangitis. If this 86 year old can undergo MRI/MRCP, this should be considered. Multiphase CT (with delayed phase imaging to evaluate for possible cholangiocarcinoma) and/or focused ultrasound would be alternate imaging strategies. 2. Otherwise, no evidence of metastatic disease in the chest, abdomen, or pelvis. 3. Coronary artery atherosclerosis. Aortic Atherosclerosis (ICD10-I70.0).   12/10/2017 Imaging   12/10/2017 MRI Abdomen IMPRESSION: 1. Stricturing of the central bile ducts of segment V RIGHT hepatic lobe without clear lesion identified. Differential would include benign and malignant stricturing including cholangiocarcinoma. Evaluation is hampered by clip artifact from cholecystectomy clips and duodenum gas. FDG PET scan may or may not be helpful in identify lesion. ERCP would be a second option to evaluate this central obstruction. 2. No enhancing lesion the in the liver parenchyma parenchyma. Altered perfusion related to the biliary obstruction.   12/23/2017 Procedure   12/23/2017 Colonoscopy Impressions: - Decreased sphincter tone found on digital rectal exam. - Two 3 mm polyps in  the rectum and in the descending colon, removed with a cold snare. Resected and retrieved. - One 8 mm polyp in the rectum, removed with a hot snare. Resected and retrieved. - Diverticulosis in the entire examined colon. - The examination was otherwise normal on direct and retroflexion views.   08/24/2018 Imaging   CT AP  IMPRESSION: 1. Over the last 9 months there has been a similar not significantly progressive appearance of biliary dilatation and complexity in segment 5 of the liver along with mild intrahepatic biliary dilatation elsewhere, and moderate extrahepatic biliary dilatation. Some of this is likely postinflammatory; the most complex and dilated bile duct did not have definite enhancement on the recent MRI to definitively indicate cholangiocarcinoma or metastatic lesion but the appearance of the right hepatic lobe is progressive from 09/19/2016 and from 04/27/2017. In the context of the patient's colon cancer, nuclear medicine PET-CT may provide some helpful adjunct information given this unusual appearance. 2. Other imaging findings of potential clinical significance: Mitral calcification. Aortoiliac atherosclerotic vascular disease. Renal cysts. Trace amount of gas in the urinary bladder, query recent catheterization. Descending and sigmoid colon diverticulosis. Two ventral hernias are enlarged compared to the prior exam but lax with wide ostium. No overt pathologic adenopathy currently.      CURRENT THERAPY:  Surveillance  INTERVAL HISTORY:  Ashley Peters is here for a follow up of colon cancer. They identified themselves by birth date. She notes she is doing well. She notes her stomach is getting bigger, she is not sure if it is weight gain. She weight 135 pounds now on her scale. She notes she is still in the same size pants. She denied abdominal pain or issues. She notes diarrhea once a week but otherwise normal. She denies blood or bleeding. She notes she get B12  injections from her PCP office. She  plans to see her PCP next month.    REVIEW OF SYSTEMS:   Constitutional: Denies fevers, chills or abnormal weight loss Eyes: Denies blurriness of vision Ears, nose, mouth, throat, and face: Denies mucositis or sore throat Respiratory: Denies cough, dyspnea or wheezes Cardiovascular: Denies palpitation, chest discomfort or lower extremity swelling Gastrointestinal:  Denies nausea, heartburn weekly diarrhea (+) abdominal distention Skin: Denies abnormal skin rashes  Lymphatics: Denies new lymphadenopathy or easy bruising Neurological:Denies numbness, tingling or new weaknesses Behavioral/Psych: Mood is stable, no new changes  All other systems were reviewed with the patient and are negative.  MEDICAL HISTORY:  Past Medical History:  Diagnosis Date  . Anemia   . Blood transfusion without reported diagnosis   . Cancer ([place])   . Colon cancer ([place])   . Diverticulosis   . Family history of breast cancer   . Family history of colon cancer   . Family history of uterine cancer   . GERD (gastroesophageal reflux disease)   . History of kidney stones   . Hyperlipidemia   . Hypertension   . Macular degeneration   . Osteopenia     SURGICAL HISTORY: Past Surgical History:  Procedure Laterality Date  . ABDOMINAL HYSTERECTOMY    . ablation tr Great saphenous vein  10/17/2009  . BREAST EXCISIONAL BIOPSY Bilateral 1971  . brest biopsy  1971  . CHOLECYSTECTOMY    . EPIGASTRIC HERNIA REPAIR N/A 10/14/2016   Procedure: REPAIR OF EPIGASTRIC VENTRAL HERNIA;  Surgeon: Donnie Mesa, MD;  Location: [place];  Service: General;  Laterality: N/A;  . lt. distal ureteral stone extraction  09/14/1991  . PARTIAL COLECTOMY Right 10/14/2016   Procedure: OPEN RIGHT HEMICOLECTOMY;  Surgeon: Donnie Mesa, MD;  Location: [place];  Service: General;  Laterality: Right;    I have reviewed the social history and family history with the patient and they are unchanged from  previous note.  ALLERGIES:  is allergic to benicar [olmesartan medoxomil] and amoxicillin.  MEDICATIONS:  Current Outpatient Medications  Medication Sig Dispense Refill  . acetaminophen (TYLENOL) 500 MG tablet Take 2 tablets (1,000 mg total) by mouth every 6 (six) hours as needed for moderate pain. 30 tablet 0  . alendronate (FOSAMAX) 70 MG tablet TAKE 1 TABLET EVERY 7 DAYS WITH A FULL GLASS OF WATER ON AN EMPTY STOMACH 12 tablet 0  . cholecalciferol (VITAMIN D) 1000 units tablet Take 1,000 Units by mouth daily.    . diphenoxylate-atropine (LOMOTIL) 2.5-0.025 MG tablet Take 1 tablet 4 (four) times daily as needed by mouth for diarrhea or loose stools. 60 tablet 0  . ferrous sulfate 325 (65 FE) MG tablet Take 1 tablet (325 mg total) by mouth 2 (two) times daily with a meal. (Patient not taking: Reported on 02/23/2019) 180 tablet 3  . Multiple Vitamins-Minerals (PRESERVISION AREDS 2 PO) Take 1 capsule by mouth 2 (two) times daily.     Current Facility-Administered Medications  Medication Dose Route Frequency Provider Last Rate Last Admin  . cyanocobalamin ((VITAMIN B-12)) injection 1,000 mcg  1,000 mcg Intramuscular Q30 days Dettinger, [name], MD   1,000 mcg at 07/19/19 0836    PHYSICAL EXAMINATION: ECOG PERFORMANCE STATUS: 1 - Symptomatic but completely ambulatory  No vitals taken today, Exam not performed today   LABORATORY DATA:  I have reviewed the data as listed CBC Latest Ref Rng & Units 02/23/2019 11/09/2018 08/24/2018  WBC 4.0 - 10.5 K/uL 5.6 6.2 6.4  Hemoglobin 12.0 - 15.0 g/dL 14.4 14.8 14.6  Hematocrit 36.0 - 46.0 % 43.3 45.7 44.2  Platelets 150 - 400 K/uL 186 220 204     CMP Latest Ref Rng & Units 02/23/2019 11/09/2018 08/24/2018  Glucose 70 - 99 mg/dL 99 98 89  BUN 8 - 23 mg/dL '15 17 12  ' Creatinine 0.44 - 1.00 mg/dL 0.75 0.76 0.73  Sodium 135 - 145 mmol/L 142 142 139  Potassium 3.5 - 5.1 mmol/L 4.1 4.8 3.7  Chloride 98 - 111 mmol/L 107 107 105  CO2 22 - 32 mmol/L '27  25 24  ' Calcium 8.9 - 10.3 mg/dL 8.8(L) 9.3 9.0  Total Protein 6.5 - 8.1 g/dL 6.7 7.0 7.2  Total Bilirubin 0.3 - 1.2 mg/dL 0.8 0.7 0.8  Alkaline Phos 38 - 126 U/L 53 67 82  AST 15 - 41 U/L '20 17 22  ' ALT 0 - 44 U/L '17 13 18      ' RADIOGRAPHIC STUDIES: I have personally reviewed the radiological images as listed and agreed with the findings in the report. No results found.   ASSESSMENT & PLAN:  Ashley Peters is a 86 y.o. female with    1. Cancer of the right colon, invasive adenocarcinoma, G1, pT4bN0Mx stage IIA, MSI-stable -Diagnosed in 09/2016. Genetic testing was negative. Treated with right hemicolectomy andadjuvantXeloda. Currently on surveillance.  -She had a colonoscopy on 10/16/2019with a fewpolyps removed. Due to her advanced age, no further surveillance colonoscopy was recommended.  -last CT in 08/2018 showed no evidence of recurrence  -She is clinically doing well. She has once a week diarrhea which she manages well. She has abdominal bloating, possible related to hernia, no pain, nausea or weight loss lately. There is no clinical concern for recurrence.  -She is almost 3 years since her cancer diagnosis. Will continue 5 year surveillance plan. Next CT scan in June 2021 -F/u after her scan    2. Anemiaand B12 deficiency -She currently receives monthlyB12 injections with PCP.  -anemia resolved, continue B12 injections.   3. Borderline DM and HTN -f/u with PCP and continue recommended medications  4. Osteoporosis -Currently on Fosamax, vitamin D and calcium. Continue. -Last DEXA was in 07/2017 and showed T score of -3.9 at radius forearm -f/u with PCP   Plan -CT AP w contrast next month.  -Copy note to Dr Warrick Parisian and will coordinate her lab draw  -Lab ans F/u in 5 weeks    No problem-specific Assessment & Plan notes found for this encounter.   Orders Placed This Encounter  Procedures  . CT Abdomen Pelvis W Contrast    Standing Status:   Future     Standing Expiration Date:   07/24/2020    Order Specific Question:   If indicated for the ordered procedure, I authorize the administration of contrast media per Radiology protocol    Answer:   Yes    Order Specific Question:   Preferred imaging location?    Answer:   [facility]    Order Specific Question:   Is Oral Contrast requested for this exam?    Answer:   Yes, Per Radiology protocol    Order Specific Question:   Radiology Contrast Protocol - do NOT remove file path    Answer:   \\[email or web address]  . CT Chest W Contrast    Standing Status:   Future    Standing Expiration Date:   07/24/2020    Order Specific Question:   If indicated for the ordered procedure, I authorize the administration of contrast media per  Radiology protocol    Answer:   Yes    Order Specific Question:   Preferred imaging location?    Answer:   [facility]    Order Specific Question:   Radiology Contrast Protocol - do NOT remove file path    Answer:   \\[email or web address]   I discussed the assessment and treatment plan with the patient. The patient was provided an opportunity to ask questions and all were answered. The patient agreed with the plan and demonstrated an understanding of the instructions.  The patient was advised to call back or seek an in-person evaluation if the symptoms worsen or if the condition fails to improve as anticipated.  The total time spent in the appointment was 22 minutes.    Truitt Merle, MD 07/25/2019   I, Joslyn Devon, am acting as scribe for Truitt Merle, MD.   I have reviewed the above documentation for accuracy and completeness, and I agree with the above.

## 2019-07-25 ENCOUNTER — Encounter: Payer: Self-pay | Admitting: Hematology

## 2019-07-25 ENCOUNTER — Inpatient Hospital Stay: Payer: Medicare Other | Attending: Hematology | Admitting: Hematology

## 2019-07-25 DIAGNOSIS — I10 Essential (primary) hypertension: Secondary | ICD-10-CM | POA: Diagnosis not present

## 2019-07-25 DIAGNOSIS — C182 Malignant neoplasm of ascending colon: Secondary | ICD-10-CM

## 2019-07-26 ENCOUNTER — Telehealth: Payer: Self-pay | Admitting: Hematology

## 2019-07-26 NOTE — Telephone Encounter (Signed)
Scheduled appt per 5/17 los.  Spoke with pt and she is aware of the appt date and time

## 2019-08-01 ENCOUNTER — Encounter (INDEPENDENT_AMBULATORY_CARE_PROVIDER_SITE_OTHER): Payer: Self-pay | Admitting: Ophthalmology

## 2019-08-01 ENCOUNTER — Ambulatory Visit (INDEPENDENT_AMBULATORY_CARE_PROVIDER_SITE_OTHER): Payer: Medicare Other | Admitting: Ophthalmology

## 2019-08-01 ENCOUNTER — Other Ambulatory Visit: Payer: Self-pay

## 2019-08-01 DIAGNOSIS — H353114 Nonexudative age-related macular degeneration, right eye, advanced atrophic with subfoveal involvement: Secondary | ICD-10-CM | POA: Diagnosis not present

## 2019-08-01 DIAGNOSIS — H353211 Exudative age-related macular degeneration, right eye, with active choroidal neovascularization: Secondary | ICD-10-CM

## 2019-08-01 MED ORDER — BEVACIZUMAB CHEMO INJECTION 1.25MG/0.05ML SYRINGE FOR KALEIDOSCOPE
1.2500 mg | INTRAVITREAL | Status: AC | PRN
  Administered 2019-08-01: 1.25 mg via INTRAVITREAL

## 2019-08-01 NOTE — Progress Notes (Signed)
08/01/2019     CHIEF COMPLAINT Patient presents for Retina Follow Up   HISTORY OF PRESENT ILLNESS: Ashley Peters is a 86 y.o. female who presents to the clinic today for:   HPI    Retina Follow Up    Patient presents with  Wet AMD.  In right eye.  This started 3 months ago.  Severity is mild.  Duration of 3 months.  Since onset it is stable.          Comments    3 Month AMD F/U OD, poss Avastin OD And OS is now 1 month status post intravitreal Avastin.  Pt denies noticeable changes to [place] OU since last visit. Pt denies ocular pain, flashes of light, or floaters OU.         Last edited by Hurman Horn, MD on 08/01/2019  8:47 AM. (History)      Referring physician: Dettinger, [name], MD [place],  [place] [id number]  HISTORICAL INFORMATION:   Selected notes from the MEDICAL RECORD NUMBER    Lab Results  Component Value Date   HGBA1C 5.7 02/04/2016     CURRENT MEDICATIONS: No current outpatient medications on file. (Ophthalmic Drugs)   No current facility-administered medications for this visit. (Ophthalmic Drugs)   Current Outpatient Medications (Other)  Medication Sig  . acetaminophen (TYLENOL) 500 MG tablet Take 2 tablets (1,000 mg total) by mouth every 6 (six) hours as needed for moderate pain.  Marland Kitchen alendronate (FOSAMAX) 70 MG tablet TAKE 1 TABLET EVERY 7 DAYS WITH A FULL GLASS OF WATER ON AN EMPTY STOMACH  . cholecalciferol (VITAMIN D) 1000 units tablet Take 1,000 Units by mouth daily.  . diphenoxylate-atropine (LOMOTIL) 2.5-0.025 MG tablet Take 1 tablet 4 (four) times daily as needed by mouth for diarrhea or loose stools.  . ferrous sulfate 325 (65 FE) MG tablet Take 1 tablet (325 mg total) by mouth 2 (two) times daily with a meal. (Patient not taking: Reported on 02/23/2019)  . Multiple Vitamins-Minerals (PRESERVISION AREDS 2 PO) Take 1 capsule by mouth 2 (two) times daily.   Current Facility-Administered Medications (Other)  Medication Route  .  cyanocobalamin ((VITAMIN B-12)) injection 1,000 mcg Intramuscular      REVIEW OF SYSTEMS:    ALLERGIES Allergies  Allergen Reactions  . Benicar [Olmesartan Medoxomil]     Elevated Potassium  . Amoxicillin Rash    Has patient had a PCN reaction causing immediate rash, facial/tongue/throat swelling, SOB or lightheadedness with hypotension: Yes Has patient had a PCN reaction causing severe rash involving mucus membranes or skin necrosis: No Has patient had a PCN reaction that required hospitalization: No Has patient had a PCN reaction occurring within the last 10 years: No If all of the above answers are "NO", then may proceed with Cephalosporin use.    PAST MEDICAL HISTORY Past Medical History:  Diagnosis Date  . Anemia   . Blood transfusion without reported diagnosis   . Cancer ([place])   . Colon cancer ([place])   . Diverticulosis   . Family history of breast cancer   . Family history of colon cancer   . Family history of uterine cancer   . GERD (gastroesophageal reflux disease)   . History of kidney stones   . Hyperlipidemia   . Hypertension   . Macular degeneration   . Osteopenia    Past Surgical History:  Procedure Laterality Date  . ABDOMINAL HYSTERECTOMY    . ablation tr Great saphenous  vein  10/17/2009  . BREAST EXCISIONAL BIOPSY Bilateral 1971  . brest biopsy  1971  . CHOLECYSTECTOMY    . EPIGASTRIC HERNIA REPAIR N/A 10/14/2016   Procedure: REPAIR OF EPIGASTRIC VENTRAL HERNIA;  Surgeon: Donnie Mesa, MD;  Location: [place];  Service: General;  Laterality: N/A;  . lt. distal ureteral stone extraction  09/14/1991  . PARTIAL COLECTOMY Right 10/14/2016   Procedure: OPEN RIGHT HEMICOLECTOMY;  Surgeon: Donnie Mesa, MD;  Location: [place];  Service: General;  Laterality: Right;    FAMILY HISTORY Family History  Problem Relation Age of Onset  . Stroke Father 25  . Breast cancer Sister        dx 45's, died at 48  . Seizures Son   . Diabetes Brother   . Kidney disease  Brother   . Heart disease Brother   . Epilepsy Son   . Colon cancer Other 30  . Uterine cancer Other 61  . Hodgkin's lymphoma Other     SOCIAL HISTORY Social History   Tobacco Use  . Smoking status: Never Smoker  . Smokeless tobacco: Never Used  Substance Use Topics  . Alcohol use: No  . Drug use: No         OPHTHALMIC EXAM: Base Eye Exam    Visual Acuity (ETDRS)      Right Left   Dist cc 20/200 -1 20/100 -3   Dist ph cc NI 20/100ecc +2   Correction: Glasses       Tonometry (Tonopen, 8:18 AM)      Right Left   Pressure 23 22       Pupils      Pupils Dark Light Shape React APD   Right PERRL 4 3 Round Brisk None   Left PERRL 4 3 Round Brisk None       Visual Fields (Counting fingers)      Left Right    Full Full       Extraocular Movement      Right Left    Full Full       Neuro/Psych    Oriented x3: Yes   Mood/Affect: Normal       Dilation    Right eye: 1.0% Mydriacyl, 2.5% Phenylephrine @ 8:18 AM        Slit Lamp and Fundus Exam    External Exam      Right Left   External Normal Normal       Slit Lamp Exam      Right Left   Lids/Lashes Normal Normal   Conjunctiva/Sclera White and quiet White and quiet   Cornea Clear Clear   Anterior Chamber Deep and quiet Deep and quiet   Iris Round and reactive Round and reactive   Lens Posterior chamber intraocular lens Posterior chamber intraocular lens   Anterior Vitreous Normal Normal       Fundus Exam      Right Left   Posterior Vitreous Posterior vitreous detachment    Disc Normal    C/D Ratio 0.4    Macula Retinal pigment epithelial mottling, Hard drusen, Geographic atrophy, Advanced age related macular degeneration    Vessels Normal    Periphery Normal           IMAGING AND PROCEDURES  Imaging and Procedures for 08/01/19           ASSESSMENT/PLAN:  No problem-specific Assessment & Plan notes found for this encounter.      ICD-10-CM   1. Exudative age-related macular  degeneration of right eye with active choroidal neovascularization (HCC)  H35.3211 OCT, Retina - OU - Both Eyes    1.OD currently at 48-month exam interval.  There is less activity superior to the foveal avascular zone.  Repeat intravitreal Avastin OD today and examination only in the right eye planned in 3 months  2.OS today at 1 month interval exam post intravitreal Avastin.  Improved anatomy.  Vision in each eye limited by subfoveal RPE choriocapillaris atrophy  3.  Ophthalmic Meds Ordered this visit:  No orders of the defined types were placed in this encounter.      No follow-ups on file.  There are no Patient Instructions on file for this visit.   Explained the diagnoses, plan, and follow up with the patient and they expressed understanding.  Patient expressed understanding of the importance of proper follow up care.   Clent Demark Liticia Gasior M.D. Diseases & Surgery of the Retina and Vitreous Retina & Diabetic [place] 08/01/19     Abbreviations: M myopia (nearsighted); A astigmatism; H hyperopia (farsighted); P presbyopia; Mrx spectacle prescription;  CTL contact lenses; OD right eye; OS left eye; OU both eyes  XT exotropia; ET esotropia; PEK punctate epithelial keratitis; PEE punctate epithelial erosions; DES dry eye syndrome; MGD meibomian gland dysfunction; ATs artificial tears; PFAT's preservative free artificial tears; [place] nuclear sclerotic cataract; PSC posterior subcapsular cataract; ERM epi-retinal membrane; PVD posterior vitreous detachment; RD retinal detachment; DM diabetes mellitus; DR diabetic retinopathy; NPDR non-proliferative diabetic retinopathy; PDR proliferative diabetic retinopathy; CSME clinically significant macular edema; DME diabetic macular edema; dbh dot blot hemorrhages; CWS cotton wool spot; POAG primary open angle glaucoma; C/D cup-to-disc ratio; HVF humphrey visual field; GVF goldmann visual field; OCT optical coherence tomography; IOP intraocular pressure;  BRVO Branch retinal vein occlusion; CRVO central retinal vein occlusion; CRAO central retinal artery occlusion; BRAO branch retinal artery occlusion; RT retinal tear; SB scleral buckle; PPV pars plana vitrectomy; VH Vitreous hemorrhage; PRP panretinal laser photocoagulation; IVK intravitreal kenalog; VMT vitreomacular traction; MH Macular hole;  NVD neovascularization of the disc; NVE neovascularization elsewhere; AREDS age related eye disease study; ARMD age related macular degeneration; POAG primary open angle glaucoma; EBMD epithelial/anterior basement membrane dystrophy; ACIOL anterior chamber intraocular lens; IOL intraocular lens; PCIOL posterior chamber intraocular lens; Phaco/IOL phacoemulsification with intraocular lens placement; [place] photorefractive keratectomy; LASIK laser assisted in situ keratomileusis; HTN hypertension; DM diabetes mellitus; COPD chronic obstructive pulmonary disease

## 2019-08-15 ENCOUNTER — Other Ambulatory Visit: Payer: Self-pay

## 2019-08-15 DIAGNOSIS — C182 Malignant neoplasm of ascending colon: Secondary | ICD-10-CM

## 2019-08-15 NOTE — Progress Notes (Signed)
Ms Ashley Peters called requesting labs for CT scan be faxed to [place].  Orders placed in epic and faxed to [phone] requesting labs be drawn there.

## 2019-08-16 ENCOUNTER — Other Ambulatory Visit: Payer: Self-pay

## 2019-08-16 DIAGNOSIS — C182 Malignant neoplasm of ascending colon: Secondary | ICD-10-CM

## 2019-08-17 ENCOUNTER — Other Ambulatory Visit: Payer: Self-pay

## 2019-08-17 ENCOUNTER — Other Ambulatory Visit: Payer: Medicare Other

## 2019-08-17 DIAGNOSIS — C182 Malignant neoplasm of ascending colon: Secondary | ICD-10-CM | POA: Diagnosis not present

## 2019-08-18 LAB — CMP AND LIVER
ALT: 20 IU/L (ref 0–32)
AST: 22 IU/L (ref 0–40)
Albumin: 4.3 g/dL (ref 3.6–4.6)
Alkaline Phosphatase: 54 IU/L (ref 48–121)
BUN: 13 mg/dL (ref 8–27)
Bilirubin Total: 0.7 mg/dL (ref 0.0–1.2)
Bilirubin, Direct: 0.17 mg/dL (ref 0.00–0.40)
CO2: 23 mmol/L (ref 20–29)
Calcium: 9.4 mg/dL (ref 8.7–10.3)
Chloride: 107 mmol/L — ABNORMAL HIGH (ref 96–106)
Creatinine, Ser: 0.75 mg/dL (ref 0.57–1.00)
GFR calc Af Amer: 82 mL/min/{1.73_m2} (ref >59)
GFR calc non Af Amer: 71 mL/min/{1.73_m2} (ref >59)
Glucose: 104 mg/dL — ABNORMAL HIGH (ref 65–99)
Potassium: 4.1 mmol/L (ref 3.5–5.2)
Sodium: 142 mmol/L (ref 134–144)
Total Protein: 6.4 g/dL (ref 6.0–8.5)

## 2019-08-22 ENCOUNTER — Telehealth: Payer: Self-pay | Admitting: Family Medicine

## 2019-08-22 ENCOUNTER — Other Ambulatory Visit: Payer: Self-pay

## 2019-08-22 ENCOUNTER — Ambulatory Visit (INDEPENDENT_AMBULATORY_CARE_PROVIDER_SITE_OTHER): Payer: Medicare Other | Admitting: Family Medicine

## 2019-08-22 ENCOUNTER — Ambulatory Visit: Payer: Medicare Other

## 2019-08-22 ENCOUNTER — Encounter: Payer: Self-pay | Admitting: Family Medicine

## 2019-08-22 VITALS — BP 130/80 | HR 77 | Temp 97.5°F | Ht <= 58 in | Wt 133.0 lb

## 2019-08-22 DIAGNOSIS — E782 Mixed hyperlipidemia: Secondary | ICD-10-CM

## 2019-08-22 DIAGNOSIS — I10 Essential (primary) hypertension: Secondary | ICD-10-CM

## 2019-08-22 DIAGNOSIS — Z85038 Personal history of other malignant neoplasm of large intestine: Secondary | ICD-10-CM

## 2019-08-22 DIAGNOSIS — E538 Deficiency of other specified B group vitamins: Secondary | ICD-10-CM

## 2019-08-22 MED ORDER — ALENDRONATE SODIUM 70 MG PO TABS: ORAL_TABLET | ORAL | 0 refills | Status: DC

## 2019-08-22 NOTE — Telephone Encounter (Signed)
Pt requesting an appt for a bone density scan.

## 2019-08-22 NOTE — Progress Notes (Signed)
BP 130/80   Pulse 77   Temp (!) 97.5 F (36.4 C)   Ht '4\' 9"'  (1.448 m)   Wt 133 lb (60.3 kg)   SpO2 97%   BMI 28.78 kg/m    Subjective:   Patient ID: Ashley Peters, female    DOB: April 26, 1928, 86 y.o.   MRN: [id number]  HPI: Ashley Peters is a 86 y.o. female presenting on 08/22/2019 for Medical Management of Chronic Issues, Hypertension, Hyperlipidemia, and B12 deficiency   HPI Hyperlipidemia Patient is coming in for recheck of his hyperlipidemia. The patient is currently taking no medication currently we are monitoring and doing diet control. They deny any issues with myalgias or history of liver damage from it. They deny any focal numbness or weakness or chest pain.   Hypertension Patient is currently on no medication currently we are monitoring, and their blood pressure today is 130/80. Patient denies any lightheadedness or dizziness. Patient denies headaches, blurred vision, chest pains, shortness of breath, or weakness. Denies any side effects from medication and is content with current medication.   Patient has a history of colon cancer and per her doctor who is monitoring this they want her to repeat a CEA.  She says she also has her last CT scan later this week.  She has been having increasing weakness and not as mobile and less steady.  She is stuck at home a lot more so this is been the biggest issue.  Relevant past medical, surgical, family and social history reviewed and updated as indicated. Interim medical history since our last visit reviewed. Allergies and medications reviewed and updated.  Review of Systems  Constitutional: Negative for chills and fever.  Eyes: Negative for visual disturbance.  Respiratory: Negative for chest tightness and shortness of breath.   Cardiovascular: Negative for chest pain and leg swelling.  Genitourinary: Negative for difficulty urinating and dysuria.  Musculoskeletal: Positive for gait problem. Negative for back pain.  Skin:  Negative for rash.  Neurological: Positive for weakness. Negative for light-headedness and headaches.  Psychiatric/Behavioral: Negative for agitation and behavioral problems.  All other systems reviewed and are negative.   Per HPI unless specifically indicated above   Allergies as of 08/22/2019      Reactions   Benicar [olmesartan Medoxomil]    Elevated Potassium   Amoxicillin Rash   Has patient had a PCN reaction causing immediate rash, facial/tongue/throat swelling, SOB or lightheadedness with hypotension: Yes Has patient had a PCN reaction causing severe rash involving mucus membranes or skin necrosis: No Has patient had a PCN reaction that required hospitalization: No Has patient had a PCN reaction occurring within the last 10 years: No If all of the above answers are "NO", then may proceed with Cephalosporin use.      Medication List       Accurate as of August 22, 2019  1:30 PM. If you have any questions, ask your nurse or doctor.        STOP taking these medications   ferrous sulfate 325 (65 FE) MG tablet Stopped by: [name] Quinterius Gaida, MD     TAKE these medications   acetaminophen 500 MG tablet Commonly known as: TYLENOL Take 2 tablets (1,000 mg total) by mouth every 6 (six) hours as needed for moderate pain.   alendronate 70 MG tablet Commonly known as: FOSAMAX TAKE 1 TABLET EVERY 7 DAYS WITH A FULL GLASS OF WATER ON AN EMPTY STOMACH   cholecalciferol 1000 units tablet Commonly known  as: VITAMIN D Take 1,000 Units by mouth daily.   diphenoxylate-atropine 2.5-0.025 MG tablet Commonly known as: LOMOTIL Take 1 tablet 4 (four) times daily as needed by mouth for diarrhea or loose stools.   PRESERVISION AREDS 2 PO Take 1 capsule by mouth 2 (two) times daily.        Objective:   BP 130/80   Pulse 77   Temp (!) 97.5 F (36.4 C)   Ht '4\' 9"'  (1.448 m)   Wt 133 lb (60.3 kg)   SpO2 97%   BMI 28.78 kg/m   Wt Readings from Last 3 Encounters:  08/22/19 133  lb (60.3 kg)  02/23/19 138 lb 11.2 oz (62.9 kg)  08/25/18 138 lb 12.8 oz (63 kg)    Physical Exam Vitals and nursing note reviewed.  Constitutional:      General: She is not in acute distress.    Appearance: She is well-developed. She is not diaphoretic.  Eyes:     Conjunctiva/sclera: Conjunctivae normal.  Cardiovascular:     Rate and Rhythm: Normal rate and regular rhythm.     Heart sounds: Normal heart sounds. No murmur heard.   Pulmonary:     Effort: Pulmonary effort is normal. No respiratory distress.     Breath sounds: Normal breath sounds. No wheezing.  Musculoskeletal:        General: No tenderness. Normal range of motion.  Skin:    General: Skin is warm and dry.     Findings: No rash.  Neurological:     Mental Status: She is alert and oriented to person, place, and time.     Coordination: Coordination normal.  Psychiatric:        Behavior: Behavior normal.     Recommend regular walking, recommended every 10 as a commercial breaks to get up and walk around the house to try and build her strength back up  Assessment & Plan:   Problem List Items Addressed This Visit      Cardiovascular and Mediastinum   HTN (hypertension)   Relevant Orders   CBC with Differential/Platelet   CMP14+EGFR     Other   Hyperlipidemia - Primary   Relevant Orders   Lipid panel   History of colon cancer   Relevant Orders   CEA   B12 deficiency   Relevant Orders   CBC with Differential/Platelet   Vitamin B12      No change in medication, will check blood work today and she will follow up with her CT scan with her oncology. Follow up plan: Return in about 6 months (around 02/21/2020), or if symptoms worsen or fail to improve, for Hypertension and cholesterol recheck.  Counseling provided for all of the vaccine components Orders Placed This Encounter  Procedures  . CBC with Differential/Platelet  . CMP14+EGFR  . CEA  . Vitamin B12  . Lipid panel    Caryl Pina,  MD [facility] 08/22/2019, 1:30 PM

## 2019-08-23 ENCOUNTER — Ambulatory Visit (INDEPENDENT_AMBULATORY_CARE_PROVIDER_SITE_OTHER): Payer: Medicare Other | Admitting: Ophthalmology

## 2019-08-23 ENCOUNTER — Encounter (INDEPENDENT_AMBULATORY_CARE_PROVIDER_SITE_OTHER): Payer: Self-pay | Admitting: Ophthalmology

## 2019-08-23 DIAGNOSIS — H353221 Exudative age-related macular degeneration, left eye, with active choroidal neovascularization: Secondary | ICD-10-CM | POA: Diagnosis not present

## 2019-08-23 DIAGNOSIS — H353211 Exudative age-related macular degeneration, right eye, with active choroidal neovascularization: Secondary | ICD-10-CM | POA: Diagnosis not present

## 2019-08-23 LAB — CMP14+EGFR
ALT: 14 IU/L (ref 0–32)
AST: 20 IU/L (ref 0–40)
Albumin/Globulin Ratio: 2.6 — ABNORMAL HIGH (ref 1.2–2.2)
Albumin: 4.4 g/dL (ref 3.6–4.6)
Alkaline Phosphatase: 55 IU/L (ref 48–121)
BUN/Creatinine Ratio: 29 — ABNORMAL HIGH (ref 12–28)
BUN: 20 mg/dL (ref 8–27)
Bilirubin Total: 0.7 mg/dL (ref 0.0–1.2)
CO2: 21 mmol/L (ref 20–29)
Calcium: 9.7 mg/dL (ref 8.7–10.3)
Chloride: 105 mmol/L (ref 96–106)
Creatinine, Ser: 0.7 mg/dL (ref 0.57–1.00)
GFR calc Af Amer: 89 mL/min/{1.73_m2} (ref >59)
GFR calc non Af Amer: 78 mL/min/{1.73_m2} (ref >59)
Globulin, Total: 1.7 g/dL (ref 1.5–4.5)
Glucose: 83 mg/dL (ref 65–99)
Potassium: 4 mmol/L (ref 3.5–5.2)
Sodium: 144 mmol/L (ref 134–144)
Total Protein: 6.1 g/dL (ref 6.0–8.5)

## 2019-08-23 LAB — CBC WITH DIFFERENTIAL/PLATELET
Basophils Absolute: 0.1 10*3/uL (ref 0.0–0.2)
Basos: 1 %
EOS (ABSOLUTE): 0.1 10*3/uL (ref 0.0–0.4)
Eos: 1 %
Hematocrit: 45.1 % (ref 34.0–46.6)
Hemoglobin: 15.1 g/dL (ref 11.1–15.9)
Immature Grans (Abs): 0 10*3/uL (ref 0.0–0.1)
Immature Granulocytes: 0 %
Lymphocytes Absolute: 0.9 10*3/uL (ref 0.7–3.1)
Lymphs: 16 %
MCH: 30.3 pg (ref 26.6–33.0)
MCHC: 33.5 g/dL (ref 31.5–35.7)
MCV: 90 fL (ref 79–97)
Monocytes Absolute: 0.4 10*3/uL (ref 0.1–0.9)
Monocytes: 7 %
Neutrophils Absolute: 4.2 10*3/uL (ref 1.4–7.0)
Neutrophils: 75 %
Platelets: 218 10*3/uL (ref 150–450)
RBC: 4.99 x10E6/uL (ref 3.77–5.28)
RDW: 12.7 % (ref 11.7–15.4)
WBC: 5.6 10*3/uL (ref 3.4–10.8)

## 2019-08-23 LAB — LIPID PANEL
Chol/HDL Ratio: 2.7 ratio (ref 0.0–4.4)
Cholesterol, Total: 167 mg/dL (ref 100–199)
HDL: 61 mg/dL (ref >39)
LDL Chol Calc (NIH): 78 mg/dL (ref 0–99)
Triglycerides: 165 mg/dL — ABNORMAL HIGH (ref 0–149)
VLDL Cholesterol Cal: 28 mg/dL (ref 5–40)

## 2019-08-23 LAB — CEA: CEA: 4.3 ng/mL (ref 0.0–4.7)

## 2019-08-23 LAB — VITAMIN B12: Vitamin B-12: >2000 pg/mL — ABNORMAL HIGH (ref 232–1245)

## 2019-08-23 MED ORDER — BEVACIZUMAB CHEMO INJECTION 1.25MG/0.05ML SYRINGE FOR KALEIDOSCOPE
1.2500 mg | INTRAVITREAL | Status: AC | PRN
  Administered 2019-08-23: 1.25 mg via INTRAVITREAL

## 2019-08-23 NOTE — Progress Notes (Signed)
[place]   [phone] [phone]) [phone]   Clinic Follow up Note   Patient Care Team: Dettinger, [name], MD as PCP - General (Family Medicine) Clent Jacks, MD as Consulting Physician (Ophthalmology) Zadie Rhine Clent Demark, MD as Consulting Physician (Ophthalmology) Donnie Mesa, MD as Consulting Physician (General Surgery) Truitt Merle, MD as Consulting Physician (Hematology)  Date of Service:  08/26/2019  CHIEF COMPLAINT: F/u on right colon cancer  SUMMARY OF ONCOLOGIC HISTORY: Oncology History Overview Note  Cancer Staging Cancer of right colon [facility]) Staging form: Colon and Rectum, AJCC 8th Edition - Pathologic stage from 10/14/2016: Stage IIC (pT4b, pN0, cM0) - Signed by Truitt Merle, MD on 11/07/2016     Cancer of right colon ([place])  09/19/2016 Imaging   CT A/P 09/19/16 IMPRESSION: Large 9.5 cm intraluminal mass in the cecum and ascending colon, consistent with colon carcinoma. Tumor extension into adjacent pericolonic fat seen along the lateral wall.  Mild right pericolonic and mesenteric lymphadenopathy, suspicious for metastatic disease. No other sites of metastatic disease identified.  Colonic diverticulosis, without radiographic evidence of diverticulitis. Tiny hiatal hernia, and small epigastric ventral hernia containing only fat.  Aortic atherosclerosis.   10/14/2016 Initial Diagnosis   Cancer of right colon ([place])   10/14/2016 Surgery   OPEN RIGHT HEMICOLECTOMY and REPAIR OF EPIGASTRIC VENTRAL HERNIA by Dr. Georgette Dover and Dr. Dalbert Batman    10/14/2016 Pathology Results   10/14/16 Diagnosis Colon, segmental resection for tumor, Right ADENOCARCINOMA WITH EXTENSIVE EXTRA CELLULAR MUCIN, GRADE 1, (11.0 CM) THE TUMOR INVADES THROUGH THE CECUM WALL INTO ADJACENT TERMINAL ILEUM (PT4B) NINETEEN BENIGN LYMPH NODES (0/19) SUPPURATIVE INFLAMMATION WITH FIBROSIS AND ADHESION TO ABDOMINAL WALL NO ADENOCARCINOMA IDENTIFIED   11/19/2016 Imaging   CT Chest  WO Contrast 11/19/16 IMPRESSION: 1. No evidence of metastatic disease. 2. Aortic atherosclerosis (ICD10-170.0). Coronary artery calcification.   11/25/2016 - 03/24/2017 Chemotherapy   Adjuvant Xeloda two weeks on, one week off starting 11/25/16 for 3 months     12/21/2016 Genetic Testing   Patient had genetic testing due to a personal history of colon cancer and a family history of breast, uterine, and colon cancer. The Common Hereditary Cancers Panel was ordered.  The Hereditary Gene Panel offered by [facility] includes sequencing and/or deletion duplication testing of the following 47 genes: APC, ATM, AXIN2, BARD1, BMPR1A, BRCA1, BRCA2, BRIP1, CDH1, CDKN2A (p14ARF), CDKN2A (p16INK4a), CHEK2, CDK4, CTNNA1, DICER1, EPCAM (Deletion/duplication testing only), GREM1 (promoter region deletion/duplication testing only), KIT, MEN1, MLH1, MSH2, MSH3, MSH6, MUTYH, NBN, NF1, NHTL1, PALB2, PDGFRA, PMS2, POLD1, POLE, PTEN, RAD50, RAD51C, RAD51D, SDHB, SDHC, SDHD, SMAD4, SMARCA4. STK11, TP53, TSC1, TSC2, and VHL.  The following genes were evaluated for sequence changes only: SDHA and HOXB13 c.251G>A variant only.  Results: Negative- no pathogenic variants identified.  The date of this test report is 12/21/2016.    04/27/2017 Imaging   CT AP W Contrast 04/27/17 IMPRESSION: Status post right hemicolectomy. No evidence of recurrent or metastatic disease. Additional stable ancillary findings as above.   08/07/2017 Imaging   08/07/2017 DEXA ASSESSMENT: The BMD measured at Forearm Radius 33% is 0.543 g/cm2 with a T-score of -3.9. This patient is considered osteoporotic according to [facility]) criteria.   11/25/2017 Imaging    11/25/2017 CT CAP IMPRESSION: 1. Abnormal appearance of the right lobe of the liver, with development of progressive peripheral intrahepatic duct dilatation with ill definition of the more central right hepatic duct. This appearance is indeterminate. Considerations include  an incidental primary liver lesion  such as cholangiocarcinoma, an otherwise occult liver metastasis with secondary biliary duct dilatation, or infectious/inflammatory cholangitis. If this 86 year old can undergo MRI/MRCP, this should be considered. Multiphase CT (with delayed phase imaging to evaluate for possible cholangiocarcinoma) and/or focused ultrasound would be alternate imaging strategies. 2. Otherwise, no evidence of metastatic disease in the chest, abdomen, or pelvis. 3. Coronary artery atherosclerosis. Aortic Atherosclerosis (ICD10-I70.0).   12/10/2017 Imaging   12/10/2017 MRI Abdomen IMPRESSION: 1. Stricturing of the central bile ducts of segment V RIGHT hepatic lobe without clear lesion identified. Differential would include benign and malignant stricturing including cholangiocarcinoma. Evaluation is hampered by clip artifact from cholecystectomy clips and duodenum gas. FDG PET scan may or may not be helpful in identify lesion. ERCP would be a second option to evaluate this central obstruction. 2. No enhancing lesion the in the liver parenchyma parenchyma. Altered perfusion related to the biliary obstruction.   12/23/2017 Procedure   12/23/2017 Colonoscopy Impressions: - Decreased sphincter tone found on digital rectal exam. - Two 3 mm polyps in the rectum and in the descending colon, removed with a cold snare. Resected and retrieved. - One 8 mm polyp in the rectum, removed with a hot snare. Resected and retrieved. - Diverticulosis in the entire examined colon. - The examination was otherwise normal on direct and retroflexion views.   08/24/2018 Imaging   CT AP  IMPRESSION: 1. Over the last 9 months there has been a similar not significantly progressive appearance of biliary dilatation and complexity in segment 5 of the liver along with mild intrahepatic biliary dilatation elsewhere, and moderate extrahepatic biliary dilatation. Some of this is likely  postinflammatory; the most complex and dilated bile duct did not have definite enhancement on the recent MRI to definitively indicate cholangiocarcinoma or metastatic lesion but the appearance of the right hepatic lobe is progressive from 09/19/2016 and from 04/27/2017. In the context of the patient's colon cancer, nuclear medicine PET-CT may provide some helpful adjunct information given this unusual appearance. 2. Other imaging findings of potential clinical significance: Mitral calcification. Aortoiliac atherosclerotic vascular disease. Renal cysts. Trace amount of gas in the urinary bladder, query recent catheterization. Descending and sigmoid colon diverticulosis. Two ventral hernias are enlarged compared to the prior exam but lax with wide ostium. No overt pathologic adenopathy currently.   08/24/2019 Imaging   CT CAP w contrast  IMPRESSION: 1. Right hemicolectomy. No evidence of recurrent or metastatic disease. 2. Intrahepatic/extrahepatic biliary ductal dilatation, unchanged from 11/25/2017. 3. Multiple midline supraumbilical ventral hernias contain unobstructed bowel or fat. 4. Aortic atherosclerosis (ICD10-I70.0). Coronary artery calcification.      CURRENT THERAPY:  Surveillance  INTERVAL HISTORY:  Ashley Peters is here for a follow up of colon cancer and review scan. She presents to the clinic with her daughter. She notes she is doing well with no new changes. She denies new pain and denies any concerns. She notes her vision has decreased, but able to still drive. She notes she receives injections for her macular degeneration with her ophthalmologist.    REVIEW OF SYSTEMS:   Constitutional: Denies fevers, chills or abnormal weight loss Eyes: Denies blurriness of vision Ears, nose, mouth, throat, and face: Denies mucositis or sore throat Respiratory: Denies cough, dyspnea or wheezes Cardiovascular: Denies palpitation, chest discomfort or lower extremity  swelling Gastrointestinal:  Denies nausea, heartburn or change in bowel habits Skin: Denies abnormal skin rashes Lymphatics: Denies new lymphadenopathy or easy bruising Neurological:Denies numbness, tingling or new weaknesses Behavioral/Psych: Mood is stable, no new changes  All other systems were reviewed with the patient and are negative.  MEDICAL HISTORY:  Past Medical History:  Diagnosis Date  . Anemia   . Blood transfusion without reported diagnosis   . Cancer ([place])   . Colon cancer ([place]) dx'd 10/2016  . Diverticulosis   . Family history of breast cancer   . Family history of colon cancer   . Family history of uterine cancer   . GERD (gastroesophageal reflux disease)   . History of kidney stones   . Hyperlipidemia   . Hypertension   . Macular degeneration   . Osteopenia     SURGICAL HISTORY: Past Surgical History:  Procedure Laterality Date  . ABDOMINAL HYSTERECTOMY    . ablation tr Great saphenous vein  10/17/2009  . BREAST EXCISIONAL BIOPSY Bilateral 1971  . brest biopsy  1971  . CHOLECYSTECTOMY    . EPIGASTRIC HERNIA REPAIR N/A 10/14/2016   Procedure: REPAIR OF EPIGASTRIC VENTRAL HERNIA;  Surgeon: Donnie Mesa, MD;  Location: [place];  Service: General;  Laterality: N/A;  . lt. distal ureteral stone extraction  09/14/1991  . PARTIAL COLECTOMY Right 10/14/2016   Procedure: OPEN RIGHT HEMICOLECTOMY;  Surgeon: Donnie Mesa, MD;  Location: [place];  Service: General;  Laterality: Right;    I have reviewed the social history and family history with the patient and they are unchanged from previous note.  ALLERGIES:  is allergic to benicar [olmesartan medoxomil] and amoxicillin.  MEDICATIONS:  Current Outpatient Medications  Medication Sig Dispense Refill  . acetaminophen (TYLENOL) 500 MG tablet Take 2 tablets (1,000 mg total) by mouth every 6 (six) hours as needed for moderate pain. 30 tablet 0  . alendronate (FOSAMAX) 70 MG tablet TAKE 1 TABLET EVERY 7 DAYS WITH A FULL  GLASS OF WATER ON AN EMPTY STOMACH 12 tablet 0  . cholecalciferol (VITAMIN D) 1000 units tablet Take 1,000 Units by mouth daily.    . diphenoxylate-atropine (LOMOTIL) 2.5-0.025 MG tablet Take 1 tablet 4 (four) times daily as needed by mouth for diarrhea or loose stools. 60 tablet 0  . Multiple Vitamins-Minerals (PRESERVISION AREDS 2 PO) Take 1 capsule by mouth 2 (two) times daily.     Current Facility-Administered Medications  Medication Dose Route Frequency Provider Last Rate Last Admin  . cyanocobalamin ((VITAMIN B-12)) injection 1,000 mcg  1,000 mcg Intramuscular Q30 days Dettinger, [name], MD   1,000 mcg at 08/22/19 1308    PHYSICAL EXAMINATION: ECOG PERFORMANCE STATUS: 1 - Symptomatic but completely ambulatory  Vitals:   08/26/19 0841  BP: (!) 166/77  Pulse: 75  Resp: 16  Temp: 97.9 F (36.6 C)  SpO2: 100%   Filed Weights   08/26/19 0841  Weight: 134 lb 3.2 oz (60.9 kg)    Due to COVID19 we will limit examination to appearance. Patient had no complaints.  GENERAL:alert, no distress and comfortable SKIN: skin color normal, no rashes or significant lesions EYES: normal, Conjunctiva are pink and non-injected, sclera clear  NEURO: alert & oriented x 3 with fluent speech   LABORATORY DATA:  I have reviewed the data as listed CBC Latest Ref Rng & Units 08/22/2019 02/23/2019 11/09/2018  WBC 3.4 - 10.8 x10E3/uL 5.6 5.6 6.2  Hemoglobin 11.1 - 15.9 g/dL 15.1 14.4 14.8  Hematocrit 34.0 - 46.6 % 45.1 43.3 45.7  Platelets 150 - 450 x10E3/uL 218 186 220     CMP Latest Ref Rng & Units 08/22/2019 08/17/2019 02/23/2019  Glucose 65 - 99 mg/dL 83 104(H) 99  BUN  8 - 27 mg/dL '20 13 15  ' Creatinine 0.57 - 1.00 mg/dL 0.70 0.75 0.75  Sodium 134 - 144 mmol/L 144 142 142  Potassium 3.5 - 5.2 mmol/L 4.0 4.1 4.1  Chloride 96 - 106 mmol/L 105 107(H) 107  CO2 20 - 29 mmol/L '21 23 27  ' Calcium 8.7 - 10.3 mg/dL 9.7 9.4 8.8(L)  Total Protein 6.0 - 8.5 g/dL 6.1 6.4 6.7  Total Bilirubin 0.0 - 1.2  mg/dL 0.7 0.7 0.8  Alkaline Phos 48 - 121 IU/L 55 54 53  AST 0 - 40 IU/L '20 22 20  ' ALT 0 - 32 IU/L '14 20 17      ' RADIOGRAPHIC STUDIES: I have personally reviewed the radiological images as listed and agreed with the findings in the report. CT Chest W Contrast  Result Date: 08/24/2019 CLINICAL DATA:  Colorectal cancer.  Chemotherapy complete. EXAM: CT CHEST, ABDOMEN, AND PELVIS WITH CONTRAST TECHNIQUE: Multidetector CT imaging of the chest, abdomen and pelvis was performed following the standard protocol during bolus administration of intravenous contrast. CONTRAST:  113m OMNIPAQUE IOHEXOL 300 MG/ML  SOLN COMPARISON:  CT abdomen pelvis 08/24/2018, MR abdomen 12/10/2017 and CT chest abdomen pelvis 11/25/2017. FINDINGS: CT CHEST FINDINGS Cardiovascular: Atherosclerotic calcification of the aorta and coronary arteries. Heart size normal. No pericardial effusion. Mediastinum/Nodes: No pathologically enlarged mediastinal, hilar or axillary lymph nodes. Esophagus is unremarkable. Lungs/Pleura: Calcified granulomas. Mild scarring in the medial aspects of both lower lobes. Lungs are otherwise clear. No pleural fluid. Airway is unremarkable. Musculoskeletal: Degenerative changes in the spine. No worrisome lytic or sclerotic lesions. CT ABDOMEN PELVIS FINDINGS Hepatobiliary: Intrahepatic and extrahepatic biliary ductal dilatation appears similar to 11/25/2017. Intrahepatic bile ducts in the right hepatic lobe have somewhat of a beaded appearance with mildly heterogeneous attenuation in the adjacent liver parenchyma, as before. Extrahepatic bile duct measures 1.4 cm, stable. Liver is otherwise unremarkable. Cholecystectomy. Pancreas: Negative. Spleen: Negative. Adrenals/Urinary Tract: Adrenal glands are unremarkable. Multiple low-attenuation lesions in the kidneys measure up to 6.4 cm off the lower pole left kidney and are likely cysts. Ureters are decompressed. Bladder is very low in volume. Stomach/Bowel: Stomach  is unremarkable. Duodenal diverticula are noted. Multiple midline supraumbilical ventral hernias contain fat, unobstructed colon or unobstructed small bowel. Small bowel is otherwise unremarkable. Right hemicolectomy. Colon is otherwise unremarkable. Vascular/Lymphatic: Atherosclerotic calcification of the aorta. Periportal lymph nodes are not enlarged by CT size criteria. Reproductive: Hysterectomy.  No adnexal mass. Other: Multiple midline supraumbilical ventral hernias contain fat, colon or unobstructed small bowel. Mesenteries and peritoneum are otherwise unremarkable. No free fluid. Musculoskeletal: Degenerative changes in the spine and hips. No worrisome lytic or sclerotic lesions. IMPRESSION: 1. Right hemicolectomy. No evidence of recurrent or metastatic disease. 2. Intrahepatic/extrahepatic biliary ductal dilatation, unchanged from 11/25/2017. 3. Multiple midline supraumbilical ventral hernias contain unobstructed bowel or fat. 4. Aortic atherosclerosis (ICD10-I70.0). Coronary artery calcification. Electronically Signed   By: MLorin PicketM.D.   On: 08/24/2019 13:01   CT Abdomen Pelvis W Contrast  Result Date: 08/24/2019 CLINICAL DATA:  Colorectal cancer.  Chemotherapy complete. EXAM: CT CHEST, ABDOMEN, AND PELVIS WITH CONTRAST TECHNIQUE: Multidetector CT imaging of the chest, abdomen and pelvis was performed following the standard protocol during bolus administration of intravenous contrast. CONTRAST:  1085mOMNIPAQUE IOHEXOL 300 MG/ML  SOLN COMPARISON:  CT abdomen pelvis 08/24/2018, MR abdomen 12/10/2017 and CT chest abdomen pelvis 11/25/2017. FINDINGS: CT CHEST FINDINGS Cardiovascular: Atherosclerotic calcification of the aorta and coronary arteries. Heart size normal. No pericardial effusion. Mediastinum/Nodes: No pathologically  enlarged mediastinal, hilar or axillary lymph nodes. Esophagus is unremarkable. Lungs/Pleura: Calcified granulomas. Mild scarring in the medial aspects of both lower  lobes. Lungs are otherwise clear. No pleural fluid. Airway is unremarkable. Musculoskeletal: Degenerative changes in the spine. No worrisome lytic or sclerotic lesions. CT ABDOMEN PELVIS FINDINGS Hepatobiliary: Intrahepatic and extrahepatic biliary ductal dilatation appears similar to 11/25/2017. Intrahepatic bile ducts in the right hepatic lobe have somewhat of a beaded appearance with mildly heterogeneous attenuation in the adjacent liver parenchyma, as before. Extrahepatic bile duct measures 1.4 cm, stable. Liver is otherwise unremarkable. Cholecystectomy. Pancreas: Negative. Spleen: Negative. Adrenals/Urinary Tract: Adrenal glands are unremarkable. Multiple low-attenuation lesions in the kidneys measure up to 6.4 cm off the lower pole left kidney and are likely cysts. Ureters are decompressed. Bladder is very low in volume. Stomach/Bowel: Stomach is unremarkable. Duodenal diverticula are noted. Multiple midline supraumbilical ventral hernias contain fat, unobstructed colon or unobstructed small bowel. Small bowel is otherwise unremarkable. Right hemicolectomy. Colon is otherwise unremarkable. Vascular/Lymphatic: Atherosclerotic calcification of the aorta. Periportal lymph nodes are not enlarged by CT size criteria. Reproductive: Hysterectomy.  No adnexal mass. Other: Multiple midline supraumbilical ventral hernias contain fat, colon or unobstructed small bowel. Mesenteries and peritoneum are otherwise unremarkable. No free fluid. Musculoskeletal: Degenerative changes in the spine and hips. No worrisome lytic or sclerotic lesions. IMPRESSION: 1. Right hemicolectomy. No evidence of recurrent or metastatic disease. 2. Intrahepatic/extrahepatic biliary ductal dilatation, unchanged from 11/25/2017. 3. Multiple midline supraumbilical ventral hernias contain unobstructed bowel or fat. 4. Aortic atherosclerosis (ICD10-I70.0). Coronary artery calcification. Electronically Signed   By: Lorin Picket M.D.   On:  08/24/2019 13:01     ASSESSMENT & PLAN:  Ashley Peters is a 86 y.o. female with    1. Cancer of the right colon, invasive adenocarcinoma, G1, pT4bN0Mx stage IIA, MSI-stable -Diagnosed in 09/2016. Genetic testing was negative. Treated with right hemicolectomy andadjuvantXeloda. Currently on surveillance.  -She had a colonoscopy on 10/16/2019with a fewpolyps removed. Due to her advanced age, no further surveillance colonoscopy was recommended.  -I personally reviewed and discussed her CT CAP from 08/24/19 which showed no evidence of recurrent or metastatic disease. She does have dilation of bile duct which is stable and likely not relater to her cancer. She does have Multiple midline supraumbilical ventral hernias from surgery. I do not think hernia repair surgery is indicated at this time, will monitor.  -She is overall doing clinically well. Labs reviewed, CBC and CMP WNL. CEA normal.  -She is about 3 years since her cancer diagnosis. Her risk of recurrence is significantly lower now. I do not plan to due further surveillance scans or colonoscopy. Will continue 5 year surveillance.  -f/u in 6 months    2. Anemiaand B12 deficiency -She currently receives monthlyB12 injectionswith PCP. -Anemia resolved.   3. Borderline DM and HTN -f/u with PCP and continue recommended medications -BP has been normal, but elevated today. She attributes it to being nervous about scan.   4. Osteoporosis -Currently on Fosamax, vitamin D and calcium. Continue. -Last DEXA was in 07/2017 and showed T score of -3.9 at radius forearm -f/u with PCP   Plan -lab and CT CAP reviewed, NED -Lab and F/u in 6 months    No problem-specific Assessment & Plan notes found for this encounter.   No orders of the defined types were placed in this encounter.  All questions were answered. The patient knows to call the clinic with any problems, questions or concerns. No barriers to learning  was  detected. The total time spent in the appointment was 30 minutes.     Truitt Merle, MD 08/26/2019   I, Joslyn Devon, am acting as scribe for Truitt Merle, MD.   I have reviewed the above documentation for accuracy and completeness, and I agree with the above.

## 2019-08-23 NOTE — Assessment & Plan Note (Signed)
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  Currently at 6-week exam interval.  We will repeat intravitreal Avastin OS today.

## 2019-08-23 NOTE — Progress Notes (Signed)
08/23/2019     CHIEF COMPLAINT Patient presents for Retina Follow Up   HISTORY OF PRESENT ILLNESS: Ashley Peters is a 86 y.o. female who presents to the clinic today for:   HPI    Retina Follow Up    Patient presents with  Wet AMD.  In left eye.  This started 7 weeks ago.  Severity is mild.  Duration of 7 weeks.  Since onset it is stable.          Comments    7 Week AMD F/U OS, poss Avastin OS  Pt denies noticeable changes to [place] OU since last visit. Pt denies ocular pain, flashes of light, or floaters OU.         Last edited by [name], [place] on 08/23/2019  8:19 AM. (History)      Referring physician: Dettinger, [name], MD [place],  [place] [address]  HISTORICAL INFORMATION:   Selected notes from the MEDICAL RECORD NUMBER    Lab Results  Component Value Date   HGBA1C 5.7 02/04/2016     CURRENT MEDICATIONS: No current outpatient medications on file. (Ophthalmic Drugs)   No current facility-administered medications for this visit. (Ophthalmic Drugs)   Current Outpatient Medications (Other)  Medication Sig  . acetaminophen (TYLENOL) 500 MG tablet Take 2 tablets (1,000 mg total) by mouth every 6 (six) hours as needed for moderate pain.  Marland Kitchen alendronate (FOSAMAX) 70 MG tablet TAKE 1 TABLET EVERY 7 DAYS WITH A FULL GLASS OF WATER ON AN EMPTY STOMACH  . cholecalciferol (VITAMIN D) 1000 units tablet Take 1,000 Units by mouth daily.  . diphenoxylate-atropine (LOMOTIL) 2.5-0.025 MG tablet Take 1 tablet 4 (four) times daily as needed by mouth for diarrhea or loose stools.  . Multiple Vitamins-Minerals (PRESERVISION AREDS 2 PO) Take 1 capsule by mouth 2 (two) times daily.   Current Facility-Administered Medications (Other)  Medication Route  . cyanocobalamin ((VITAMIN B-12)) injection 1,000 mcg Intramuscular      REVIEW OF SYSTEMS:    ALLERGIES Allergies  Allergen Reactions  . Benicar [Olmesartan Medoxomil]     Elevated Potassium  .  Amoxicillin Rash    Has patient had a PCN reaction causing immediate rash, facial/tongue/throat swelling, SOB or lightheadedness with hypotension: Yes Has patient had a PCN reaction causing severe rash involving mucus membranes or skin necrosis: No Has patient had a PCN reaction that required hospitalization: No Has patient had a PCN reaction occurring within the last 10 years: No If all of the above answers are "NO", then may proceed with Cephalosporin use.    PAST MEDICAL HISTORY Past Medical History:  Diagnosis Date  . Anemia   . Blood transfusion without reported diagnosis   . Cancer ([place])   . Colon cancer ([place])   . Diverticulosis   . Family history of breast cancer   . Family history of colon cancer   . Family history of uterine cancer   . GERD (gastroesophageal reflux disease)   . History of kidney stones   . Hyperlipidemia   . Hypertension   . Macular degeneration   . Osteopenia    Past Surgical History:  Procedure Laterality Date  . ABDOMINAL HYSTERECTOMY    . ablation tr Great saphenous vein  10/17/2009  . BREAST EXCISIONAL BIOPSY Bilateral 1971  . brest biopsy  1971  . CHOLECYSTECTOMY    . EPIGASTRIC HERNIA REPAIR N/A 10/14/2016   Procedure: REPAIR OF EPIGASTRIC VENTRAL HERNIA;  Surgeon: Donnie Mesa, MD;  [facility]: [facility];  Service: General;  Laterality: N/A;  . lt. distal ureteral stone extraction  09/14/1991  . PARTIAL COLECTOMY Right 10/14/2016   Procedure: OPEN RIGHT HEMICOLECTOMY;  Surgeon: Donnie Mesa, MD;  Location: [facility];  Service: General;  Laterality: Right;    FAMILY HISTORY Family History  Problem Relation Age of Onset  . Stroke Father 71  . Breast cancer Sister        dx 20's, died at 7  . Seizures Son   . Diabetes Brother   . Kidney disease Brother   . Heart disease Brother   . Epilepsy Son   . Colon cancer Other 11  . Uterine cancer Other 82  . Hodgkin's lymphoma Other     SOCIAL HISTORY Social History   Tobacco Use  . Smoking  status: Never Smoker  . Smokeless tobacco: Never Used  Vaping Use  . Vaping Use: Never used  Substance Use Topics  . Alcohol use: No  . Drug use: No         OPHTHALMIC EXAM:  Base Eye Exam    Visual Acuity (ETDRS)      Right Left   Dist cc 20/400 20/200   Dist ph cc NI 20/100 +1   Correction: Glasses       Tonometry (Tonopen, 8:24 AM)      Right Left   Pressure 13 14       Pupils      Pupils Dark Light Shape React APD   Right PERRL 4 3 Round Brisk None   Left PERRL 4 3 Round Brisk None       Visual Fields (Counting fingers)      Left Right    Full Full       Extraocular Movement      Right Left    Full Full       Neuro/Psych    Oriented x3: Yes   Mood/Affect: Normal       Dilation    Left eye: 1.0% Mydriacyl, 2.5% Phenylephrine @ 8:24 AM        Slit Lamp and Fundus Exam    External Exam      Right Left   External Normal Normal       Slit Lamp Exam      Right Left   Lids/Lashes Normal Normal   Conjunctiva/Sclera White and quiet White and quiet   Cornea Clear Clear   Anterior Chamber Deep and quiet Deep and quiet   Iris Round and reactive Round and reactive   Lens Posterior chamber intraocular lens Posterior chamber intraocular lens   Anterior Vitreous Normal Normal       Fundus Exam      Right Left   Posterior Vitreous  Normal   Disc  Normal   C/D Ratio  0.25   Macula  Retinal pigment epithelial mottling, Hard drusen, Pigmented atrophy   Vessels  Normal   Periphery  Normal          IMAGING AND PROCEDURES  Imaging and Procedures for 08/23/19  OCT, Retina - OU - Both Eyes       Right Eye Quality was good. Central Foveal Thickness: 250. Progression has improved. Findings include abnormal foveal contour, retinal drusen , outer retinal atrophy, subretinal scarring.   Left Eye Quality was good. Scan locations included subfoveal. Central Foveal Thickness: 259. Progression has improved. Findings include abnormal foveal contour,  vitreomacular adhesion , outer retinal atrophy, subretinal scarring, retinal drusen , subretinal  hyper-reflective material, intraretinal hyper-reflective material.   Notes OS some 6 weeks status post intravitreal Avastin, and condition appears stable, vision limited by outer retinal scarring and disruption  photoreceptor layer       Intravitreal Injection, Pharmacologic Agent - OS - Left Eye       Time Out 08/23/2019. 8:55 AM. Confirmed correct patient, procedure, site, and patient consented.   Anesthesia Topical anesthesia was used. Anesthetic medications included Akten 3.5%.   Procedure Preparation included Tobramycin 0.3%, 10% betadine to eyelids. A 30 gauge needle was used.   Injection:  1.25 mg Bevacizumab (AVASTIN) SOLN   NDC: [address]-5, Lot: [id number]   Route: Intravitreal, Site: Left Eye, Waste: 0 mg  Post-op Post injection exam found visual acuity of at least counting fingers. The patient tolerated the procedure well. There were no complications. The patient received written and verbal post procedure care education. Post injection medications were not given.                 ASSESSMENT/PLAN:  Exudative age-related macular degeneration of left eye with active choroidal neovascularization (HCC) Macular findings from adverse events of choroidal neovascular membrane in the subfoveal location, are stable.  Currently at 6-week exam interval.  We will repeat intravitreal Avastin OS today.  Exudative age-related macular degeneration of right eye with active choroidal neovascularization (HCC) Right eye, on exam interval previously scheduled.  Follow-up as scheduled possible intravitreal Avastin OD      ICD-10-CM   1. Exudative age-related macular degeneration of left eye with active choroidal neovascularization (HCC)  H35.3221 OCT, Retina - OU - Both Eyes    Intravitreal Injection, Pharmacologic Agent - OS - Left Eye    Bevacizumab (AVASTIN) SOLN 1.25 mg  2. Exudative  age-related macular degeneration of right eye with active choroidal neovascularization ([place])  H35.3211     1.  Repeat intravitreal Avastin OS today, maintain 6-week interval for stability.  Stable vision  2.  OD examination on next visit possible intravitreal Avastin  3.  Ophthalmic Meds Ordered this visit:  Meds ordered this encounter  Medications  . Bevacizumab (AVASTIN) SOLN 1.25 mg       Return in about 6 weeks (around 10/04/2019) for AVASTIN OCT, OS, dilate.  There are no Patient Instructions on file for this visit.   Explained the diagnoses, plan, and follow up with the patient and they expressed understanding.  Patient expressed understanding of the importance of proper follow up care.   Clent Demark Kenzel Ruesch M.D. Diseases & Surgery of the Retina and Vitreous Retina & Diabetic [place] 08/23/19     Abbreviations: M myopia (nearsighted); A astigmatism; H hyperopia (farsighted); P presbyopia; Mrx spectacle prescription;  CTL contact lenses; OD right eye; OS left eye; OU both eyes  XT exotropia; ET esotropia; PEK punctate epithelial keratitis; PEE punctate epithelial erosions; DES dry eye syndrome; MGD meibomian gland dysfunction; ATs artificial tears; PFAT's preservative free artificial tears; [place] nuclear sclerotic cataract; PSC posterior subcapsular cataract; ERM epi-retinal membrane; PVD posterior vitreous detachment; RD retinal detachment; DM diabetes mellitus; DR diabetic retinopathy; NPDR non-proliferative diabetic retinopathy; PDR proliferative diabetic retinopathy; CSME clinically significant macular edema; DME diabetic macular edema; dbh dot blot hemorrhages; CWS cotton wool spot; POAG primary open angle glaucoma; C/D cup-to-disc ratio; HVF humphrey visual field; GVF goldmann visual field; OCT optical coherence tomography; IOP intraocular pressure; BRVO Branch retinal vein occlusion; CRVO central retinal vein occlusion; CRAO central retinal artery occlusion; BRAO branch  retinal artery occlusion; RT retinal tear; SB scleral buckle;  PPV pars plana vitrectomy; VH Vitreous hemorrhage; PRP panretinal laser photocoagulation; IVK intravitreal kenalog; VMT vitreomacular traction; MH Macular hole;  NVD neovascularization of the disc; NVE neovascularization elsewhere; AREDS age related eye disease study; ARMD age related macular degeneration; POAG primary open angle glaucoma; EBMD epithelial/anterior basement membrane dystrophy; ACIOL anterior chamber intraocular lens; IOL intraocular lens; PCIOL posterior chamber intraocular lens; Phaco/IOL phacoemulsification with intraocular lens placement; [place] photorefractive keratectomy; LASIK laser assisted in situ keratomileusis; HTN hypertension; DM diabetes mellitus; COPD chronic obstructive pulmonary disease

## 2019-08-23 NOTE — Assessment & Plan Note (Signed)
Right eye, on exam interval previously scheduled.  Follow-up as scheduled possible intravitreal Avastin OD

## 2019-08-24 ENCOUNTER — Other Ambulatory Visit: Payer: Self-pay

## 2019-08-24 ENCOUNTER — Encounter (HOSPITAL_COMMUNITY): Payer: Self-pay

## 2019-08-24 ENCOUNTER — Ambulatory Visit (HOSPITAL_COMMUNITY)
Admission: RE | Admit: 2019-08-24 | Discharge: 2019-08-24 | Disposition: A | Discharge status: Home / Self Care | Payer: Medicare Other | Source: Ambulatory Visit | Attending: Hematology | Admitting: Hematology

## 2019-08-24 DIAGNOSIS — C182 Malignant neoplasm of ascending colon: Secondary | ICD-10-CM | POA: Diagnosis present

## 2019-08-24 MED ORDER — IOHEXOL 300 MG/ML  SOLN
100.0000 mL | Freq: Once | INTRAMUSCULAR | Status: AC | PRN
  Administered 2019-08-24: 100 mL via INTRAVENOUS

## 2019-08-24 MED ORDER — SODIUM CHLORIDE (PF) 0.9 % IJ SOLN
INTRAMUSCULAR | Status: AC
  Filled 2019-08-24: qty 50

## 2019-08-26 ENCOUNTER — Telehealth: Payer: Self-pay | Admitting: Hematology

## 2019-08-26 ENCOUNTER — Encounter: Payer: Self-pay | Admitting: Hematology

## 2019-08-26 ENCOUNTER — Inpatient Hospital Stay: Payer: Medicare Other | Attending: Hematology | Admitting: Hematology

## 2019-08-26 ENCOUNTER — Other Ambulatory Visit: Payer: Self-pay

## 2019-08-26 VITALS — BP 166/77 | HR 75 | Temp 97.9°F | Resp 16 | Wt 134.2 lb

## 2019-08-26 DIAGNOSIS — R7303 Prediabetes: Secondary | ICD-10-CM | POA: Insufficient documentation

## 2019-08-26 DIAGNOSIS — C182 Malignant neoplasm of ascending colon: Secondary | ICD-10-CM | POA: Diagnosis not present

## 2019-08-26 DIAGNOSIS — I10 Essential (primary) hypertension: Secondary | ICD-10-CM | POA: Insufficient documentation

## 2019-08-26 DIAGNOSIS — M810 Age-related osteoporosis without current pathological fracture: Secondary | ICD-10-CM | POA: Insufficient documentation

## 2019-08-26 DIAGNOSIS — Z85038 Personal history of other malignant neoplasm of large intestine: Secondary | ICD-10-CM | POA: Diagnosis not present

## 2019-08-26 DIAGNOSIS — E538 Deficiency of other specified B group vitamins: Secondary | ICD-10-CM | POA: Insufficient documentation

## 2019-08-26 DIAGNOSIS — D649 Anemia, unspecified: Secondary | ICD-10-CM | POA: Diagnosis not present

## 2019-08-26 NOTE — Telephone Encounter (Signed)
Scheduled per 6/18 los. Pt requested early mornings. Printed appt calendar and avs for pt.

## 2019-09-06 ENCOUNTER — Telehealth: Payer: Self-pay | Admitting: Family Medicine

## 2019-09-06 NOTE — Telephone Encounter (Signed)
Calling on RX for [employer]

## 2019-09-21 ENCOUNTER — Other Ambulatory Visit: Payer: Self-pay

## 2019-09-21 ENCOUNTER — Ambulatory Visit (INDEPENDENT_AMBULATORY_CARE_PROVIDER_SITE_OTHER): Payer: Medicare Other | Admitting: *Deleted

## 2019-09-21 DIAGNOSIS — E538 Deficiency of other specified B group vitamins: Secondary | ICD-10-CM | POA: Diagnosis not present

## 2019-09-21 NOTE — Progress Notes (Signed)
Patient in office today for B 12 injection. 1000 mcg given IM in right deltoid. Patient tolerated well.

## 2019-10-13 ENCOUNTER — Ambulatory Visit (INDEPENDENT_AMBULATORY_CARE_PROVIDER_SITE_OTHER): Payer: Medicare Other | Admitting: Ophthalmology

## 2019-10-13 ENCOUNTER — Other Ambulatory Visit: Payer: Self-pay

## 2019-10-13 ENCOUNTER — Encounter (INDEPENDENT_AMBULATORY_CARE_PROVIDER_SITE_OTHER): Payer: Self-pay | Admitting: Ophthalmology

## 2019-10-13 DIAGNOSIS — H353212 Exudative age-related macular degeneration, right eye, with inactive choroidal neovascularization: Secondary | ICD-10-CM

## 2019-10-13 DIAGNOSIS — H353221 Exudative age-related macular degeneration, left eye, with active choroidal neovascularization: Secondary | ICD-10-CM | POA: Diagnosis not present

## 2019-10-13 MED ORDER — BEVACIZUMAB CHEMO INJECTION 1.25MG/0.05ML SYRINGE FOR KALEIDOSCOPE
1.2500 mg | INTRAVITREAL | Status: AC | PRN
  Administered 2019-10-13: 1.25 mg via INTRAVITREAL

## 2019-10-13 NOTE — Assessment & Plan Note (Signed)
Will dilate each eye at next visit

## 2019-10-13 NOTE — Assessment & Plan Note (Addendum)
Active CNVM, currently at 7-week interval, repeat today to maintain best acuity in this best eye

## 2019-10-13 NOTE — Progress Notes (Signed)
10/13/2019     CHIEF COMPLAINT Patient presents for Retina Follow Up   HISTORY OF PRESENT ILLNESS: Ashley Peters is a 86 y.o. female who presents to the clinic today for:   HPI    Retina Follow Up    Patient presents with  Wet AMD.  In left eye.  Severity is moderate.  Duration of 7 weeks.  Since onset it is stable.  I, the attending physician,  performed the HPI with the patient and updated documentation appropriately.          Comments    7 Week Wet AMD f\u OS. Possible Avastin OS. OCT  Pt states vision is stable. Denies any complaints.       Last edited by Tilda Franco on 10/13/2019  8:12 AM. (History)      Referring physician: Dettinger, [name], MD [place],  [place] [address]  HISTORICAL INFORMATION:   Selected notes from the MEDICAL RECORD NUMBER    Lab Results  Component Value Date   HGBA1C 5.7 02/04/2016     CURRENT MEDICATIONS: No current outpatient medications on file. (Ophthalmic Drugs)   No current facility-administered medications for this visit. (Ophthalmic Drugs)   Current Outpatient Medications (Other)  Medication Sig  . acetaminophen (TYLENOL) 500 MG tablet Take 2 tablets (1,000 mg total) by mouth every 6 (six) hours as needed for moderate pain.  Marland Kitchen alendronate (FOSAMAX) 70 MG tablet TAKE 1 TABLET EVERY 7 DAYS WITH A FULL GLASS OF WATER ON AN EMPTY STOMACH  . cholecalciferol (VITAMIN D) 1000 units tablet Take 1,000 Units by mouth daily.  . diphenoxylate-atropine (LOMOTIL) 2.5-0.025 MG tablet Take 1 tablet 4 (four) times daily as needed by mouth for diarrhea or loose stools.  . Multiple Vitamins-Minerals (PRESERVISION AREDS 2 PO) Take 1 capsule by mouth 2 (two) times daily.   Current Facility-Administered Medications (Other)  Medication Route  . cyanocobalamin ((VITAMIN B-12)) injection 1,000 mcg Intramuscular      REVIEW OF SYSTEMS:    ALLERGIES Allergies  Allergen Reactions  . Benicar [Olmesartan Medoxomil]      Elevated Potassium  . Amoxicillin Rash    Has patient had a PCN reaction causing immediate rash, facial/tongue/throat swelling, SOB or lightheadedness with hypotension: Yes Has patient had a PCN reaction causing severe rash involving mucus membranes or skin necrosis: No Has patient had a PCN reaction that required hospitalization: No Has patient had a PCN reaction occurring within the last 10 years: No If all of the above answers are "NO", then may proceed with Cephalosporin use.    PAST MEDICAL HISTORY Past Medical History:  Diagnosis Date  . Anemia   . Blood transfusion without reported diagnosis   . Cancer ([place])   . Colon cancer ([place]) dx'd 10/2016  . Diverticulosis   . Family history of breast cancer   . Family history of colon cancer   . Family history of uterine cancer   . GERD (gastroesophageal reflux disease)   . History of kidney stones   . Hyperlipidemia   . Hypertension   . Macular degeneration   . Osteopenia    Past Surgical History:  Procedure Laterality Date  . ABDOMINAL HYSTERECTOMY    . ablation tr Great saphenous vein  10/17/2009  . BREAST EXCISIONAL BIOPSY Bilateral 1971  . brest biopsy  1971  . CHOLECYSTECTOMY    . EPIGASTRIC HERNIA REPAIR N/A 10/14/2016   Procedure: REPAIR OF EPIGASTRIC VENTRAL HERNIA;  Surgeon: Donnie Mesa, MD;  Location: [facility];  Service: General;  Laterality: N/A;  . lt. distal ureteral stone extraction  09/14/1991  . PARTIAL COLECTOMY Right 10/14/2016   Procedure: OPEN RIGHT HEMICOLECTOMY;  Surgeon: Donnie Mesa, MD;  Location: [facility];  Service: General;  Laterality: Right;    FAMILY HISTORY Family History  Problem Relation Age of Onset  . Stroke Father 67  . Breast cancer Sister        dx 64's, died at 63  . Seizures Son   . Diabetes Brother   . Kidney disease Brother   . Heart disease Brother   . Epilepsy Son   . Colon cancer Other 10  . Uterine cancer Other 68  . Hodgkin's lymphoma Other     SOCIAL HISTORY Social  History   Tobacco Use  . Smoking status: Never Smoker  . Smokeless tobacco: Never Used  Vaping Use  . Vaping Use: Never used  Substance Use Topics  . Alcohol use: No  . Drug use: No         OPHTHALMIC EXAM: Base Eye Exam    Visual Acuity (Snellen - Linear)      Right Left   Dist cc 20/400 + 20/100 -1   Dist ph cc NI 20/100 +   Correction: Glasses       Tonometry (Tonopen, 8:16 AM)      Right Left   Pressure 12 10       Pupils      Pupils Dark Light Shape React APD   Right PERRL 4 3 Round Brisk None   Left PERRL 4 3 Round Brisk None       Visual Fields (Counting fingers)      Left Right    Full Full       Neuro/Psych    Oriented x3: Yes   Mood/Affect: Normal       Dilation    Left eye: 1.0% Mydriacyl, 2.5% Phenylephrine @ 8:16 AM        Slit Lamp and Fundus Exam    External Exam      Right Left   External Normal Normal       Slit Lamp Exam      Right Left   Lids/Lashes Normal Normal   Conjunctiva/Sclera White and quiet White and quiet   Cornea Clear Clear   Anterior Chamber Deep and quiet Deep and quiet   Iris Round and reactive Round and reactive   Lens Posterior chamber intraocular lens Posterior chamber intraocular lens   Anterior Vitreous Normal Normal       Fundus Exam      Right Left   Posterior Vitreous  Normal   Disc  Normal   C/D Ratio  0.25   Macula  Retinal pigment epithelial mottling, Hard drusen, Pigmented atrophy   Vessels  Normal   Periphery  Normal          IMAGING AND PROCEDURES  Imaging and Procedures for 10/13/19  OCT, Retina - OU - Both Eyes       Right Eye Quality was good. Scan locations included subfoveal. Central Foveal Thickness: 250. Progression has been stable. Findings include central retinal atrophy, outer retinal atrophy, subretinal scarring, retinal drusen , abnormal foveal contour.   Left Eye Quality was good. Scan locations included subfoveal. Central Foveal Thickness: 259. Progression has  improved. Findings include abnormal foveal contour, no SRF, outer retinal tubulation, intraretinal fluid.   Notes Much less active wet AMD OS, on therapy.  We will  repeat injection today intravitreal Avastin at 7-week interval and maintain visit interval.  Will dilate OU next visit       Intravitreal Injection, Pharmacologic Agent - OS - Left Eye       Time Out 10/13/2019. 9:12 AM. Confirmed correct patient, procedure, site, and patient consented.   Anesthesia Topical anesthesia was used. Anesthetic medications included Akten 3.5%.   Procedure Preparation included Ofloxacin , 10% betadine to eyelids, 5% betadine to ocular surface. A 30 gauge needle was used.   Injection:  1.25 mg Bevacizumab (AVASTIN) SOLN   NDC: [address]-5, Lot: [id number]   Route: Intravitreal, Site: Left Eye, Waste: 0 mg  Post-op Post injection exam found visual acuity of at least counting fingers. The patient tolerated the procedure well. There were no complications. The patient received written and verbal post procedure care education. Post injection medications were not given.                 ASSESSMENT/PLAN:  Exudative age-related macular degeneration of right eye with inactive choroidal neovascularization (HCC) Will dilate each eye at next visit  Exudative age-related macular degeneration of left eye with active choroidal neovascularization (HCC) Active CNVM, currently at 7-week interval, repeat today to maintain best acuity in this best eye      ICD-10-CM   1. Exudative age-related macular degeneration of left eye with active choroidal neovascularization (HCC)  H35.3221 OCT, Retina - OU - Both Eyes    Intravitreal Injection, Pharmacologic Agent - OS - Left Eye    Bevacizumab (AVASTIN) SOLN 1.25 mg  2. Exudative age-related macular degeneration of right eye with inactive choroidal neovascularization ([place])  H35.3212     1.  2.  3.  Ophthalmic Meds Ordered this visit:  Meds ordered this  encounter  Medications  . Bevacizumab (AVASTIN) SOLN 1.25 mg       Return in about 7 weeks (around 12/01/2019) for DILATE OU, AVASTIN OCT, OS.  There are no Patient Instructions on file for this visit.   Explained the diagnoses, plan, and follow up with the patient and they expressed understanding.  Patient expressed understanding of the importance of proper follow up care.   Clent Demark Suzzane Quilter M.D. Diseases & Surgery of the Retina and Vitreous Retina & Diabetic [place] 10/13/19     Abbreviations: M myopia (nearsighted); A astigmatism; H hyperopia (farsighted); P presbyopia; Mrx spectacle prescription;  CTL contact lenses; OD right eye; OS left eye; OU both eyes  XT exotropia; ET esotropia; PEK punctate epithelial keratitis; PEE punctate epithelial erosions; DES dry eye syndrome; MGD meibomian gland dysfunction; ATs artificial tears; PFAT's preservative free artificial tears; [place] nuclear sclerotic cataract; PSC posterior subcapsular cataract; ERM epi-retinal membrane; PVD posterior vitreous detachment; RD retinal detachment; DM diabetes mellitus; DR diabetic retinopathy; NPDR non-proliferative diabetic retinopathy; PDR proliferative diabetic retinopathy; CSME clinically significant macular edema; DME diabetic macular edema; dbh dot blot hemorrhages; CWS cotton wool spot; POAG primary open angle glaucoma; C/D cup-to-disc ratio; HVF humphrey visual field; GVF goldmann visual field; OCT optical coherence tomography; IOP intraocular pressure; BRVO Branch retinal vein occlusion; CRVO central retinal vein occlusion; CRAO central retinal artery occlusion; BRAO branch retinal artery occlusion; RT retinal tear; SB scleral buckle; PPV pars plana vitrectomy; VH Vitreous hemorrhage; PRP panretinal laser photocoagulation; IVK intravitreal kenalog; VMT vitreomacular traction; MH Macular hole;  NVD neovascularization of the disc; NVE neovascularization elsewhere; AREDS age related eye disease study; ARMD age  related macular degeneration; POAG primary open angle glaucoma; EBMD epithelial/anterior basement membrane dystrophy; ACIOL  anterior chamber intraocular lens; IOL intraocular lens; PCIOL posterior chamber intraocular lens; Phaco/IOL phacoemulsification with intraocular lens placement; [place] photorefractive keratectomy; LASIK laser assisted in situ keratomileusis; HTN hypertension; DM diabetes mellitus; COPD chronic obstructive pulmonary disease

## 2019-10-24 ENCOUNTER — Other Ambulatory Visit: Payer: Self-pay

## 2019-10-24 ENCOUNTER — Ambulatory Visit (INDEPENDENT_AMBULATORY_CARE_PROVIDER_SITE_OTHER): Payer: Medicare Other | Admitting: Family Medicine

## 2019-10-24 DIAGNOSIS — E538 Deficiency of other specified B group vitamins: Secondary | ICD-10-CM

## 2019-10-31 ENCOUNTER — Other Ambulatory Visit: Payer: Self-pay

## 2019-10-31 ENCOUNTER — Encounter (INDEPENDENT_AMBULATORY_CARE_PROVIDER_SITE_OTHER): Payer: Self-pay | Admitting: Ophthalmology

## 2019-10-31 ENCOUNTER — Ambulatory Visit (INDEPENDENT_AMBULATORY_CARE_PROVIDER_SITE_OTHER): Payer: Medicare Other | Admitting: Ophthalmology

## 2019-10-31 DIAGNOSIS — H353114 Nonexudative age-related macular degeneration, right eye, advanced atrophic with subfoveal involvement: Secondary | ICD-10-CM | POA: Diagnosis not present

## 2019-10-31 DIAGNOSIS — H353212 Exudative age-related macular degeneration, right eye, with inactive choroidal neovascularization: Secondary | ICD-10-CM | POA: Diagnosis not present

## 2019-10-31 NOTE — Assessment & Plan Note (Signed)
Today is for 82-month follow-up of the right eye status post intravitreal Avastin for prior wet ARMD, now stable and involutional with extensive dry atrophic change and outer retinal scarring accounting for acuity.  There are no active neovascular edges to the large regional choriocapillaris dropout of dry ARMD

## 2019-10-31 NOTE — Assessment & Plan Note (Signed)
No signs of wet ARMD today, follow dry ARMD as scheduled

## 2019-10-31 NOTE — Progress Notes (Addendum)
10/31/2019     CHIEF COMPLAINT Patient presents for Retina Follow Up   HISTORY OF PRESENT ILLNESS: Ashley Peters is a 86 y.o. female who presents to the clinic today for:   HPI    Retina Follow Up    Patient presents with  Wet AMD.  In right eye.  This started 3 months ago.  Severity is moderate.  Duration of 3 months.  Since onset it is stable.          Comments    3 Month AMD F/U OD  Pt denies noticeable changes to [place] OU since last visit. Pt denies ocular pain, flashes of light, or floaters OU.           Last edited by [name], [place] on 10/31/2019  8:05 AM. (History)      Referring physician: Dettinger, [name], MD [place],  [place] [address]  HISTORICAL INFORMATION:   Selected notes from the MEDICAL RECORD NUMBER    Lab Results  Component Value Date   HGBA1C 5.7 02/04/2016     CURRENT MEDICATIONS: No current outpatient medications on file. (Ophthalmic Drugs)   No current facility-administered medications for this visit. (Ophthalmic Drugs)   Current Outpatient Medications (Other)  Medication Sig  . acetaminophen (TYLENOL) 500 MG tablet Take 2 tablets (1,000 mg total) by mouth every 6 (six) hours as needed for moderate pain.  Marland Kitchen alendronate (FOSAMAX) 70 MG tablet TAKE 1 TABLET EVERY 7 DAYS WITH A FULL GLASS OF WATER ON AN EMPTY STOMACH  . cholecalciferol (VITAMIN D) 1000 units tablet Take 1,000 Units by mouth daily.  . diphenoxylate-atropine (LOMOTIL) 2.5-0.025 MG tablet Take 1 tablet 4 (four) times daily as needed by mouth for diarrhea or loose stools.  . Multiple Vitamins-Minerals (PRESERVISION AREDS 2 PO) Take 1 capsule by mouth 2 (two) times daily.   Current Facility-Administered Medications (Other)  Medication Route  . cyanocobalamin ((VITAMIN B-12)) injection 1,000 mcg Intramuscular      REVIEW OF SYSTEMS:    ALLERGIES Allergies  Allergen Reactions  . Benicar [Olmesartan Medoxomil]     Elevated Potassium  . Amoxicillin  Rash    Has patient had a PCN reaction causing immediate rash, facial/tongue/throat swelling, SOB or lightheadedness with hypotension: Yes Has patient had a PCN reaction causing severe rash involving mucus membranes or skin necrosis: No Has patient had a PCN reaction that required hospitalization: No Has patient had a PCN reaction occurring within the last 10 years: No If all of the above answers are "NO", then may proceed with Cephalosporin use.    PAST MEDICAL HISTORY Past Medical History:  Diagnosis Date  . Anemia   . Blood transfusion without reported diagnosis   . Cancer ([place])   . Colon cancer ([place]) dx'd 10/2016  . Diverticulosis   . Family history of breast cancer   . Family history of colon cancer   . Family history of uterine cancer   . GERD (gastroesophageal reflux disease)   . History of kidney stones   . Hyperlipidemia   . Hypertension   . Macular degeneration   . Osteopenia    Past Surgical History:  Procedure Laterality Date  . ABDOMINAL HYSTERECTOMY    . ablation tr Great saphenous vein  10/17/2009  . BREAST EXCISIONAL BIOPSY Bilateral 1971  . brest biopsy  1971  . CHOLECYSTECTOMY    . EPIGASTRIC HERNIA REPAIR N/A 10/14/2016   Procedure: REPAIR OF EPIGASTRIC VENTRAL HERNIA;  Surgeon: Donnie Mesa, MD;  Location: [facility];  Service: General;  Laterality: N/A;  . lt. distal ureteral stone extraction  09/14/1991  . PARTIAL COLECTOMY Right 10/14/2016   Procedure: OPEN RIGHT HEMICOLECTOMY;  Surgeon: Donnie Mesa, MD;  Location: [facility];  Service: General;  Laterality: Right;    FAMILY HISTORY Family History  Problem Relation Age of Onset  . Stroke Father 30  . Breast cancer Sister        dx 90's, died at 52  . Seizures Son   . Diabetes Brother   . Kidney disease Brother   . Heart disease Brother   . Epilepsy Son   . Colon cancer Other 20  . Uterine cancer Other 54  . Hodgkin's lymphoma Other     SOCIAL HISTORY Social History   Tobacco Use  . Smoking  status: Never Smoker  . Smokeless tobacco: Never Used  Vaping Use  . Vaping Use: Never used  Substance Use Topics  . Alcohol use: No  . Drug use: No         OPHTHALMIC EXAM:  Base Eye Exam    Visual Acuity (ETDRS)      Right Left   Dist cc 20/100 -3 20/100 -3   Dist ph cc NI 20/100 +1   Correction: Glasses       Tonometry (Tonopen, 8:07 AM)      Right Left   Pressure 14 14       Pupils      Pupils Dark Light Shape React APD   Right PERRL 4 3 Round Brisk None   Left PERRL 4 3 Round Brisk None       Visual Fields (Counting fingers)      Left Right    Full Full       Extraocular Movement      Right Left    Full Full       Neuro/Psych    Oriented x3: Yes   Mood/Affect: Normal       Dilation    Right eye: 1.0% Mydriacyl, 2.5% Phenylephrine @ 8:09 AM        Slit Lamp and Fundus Exam    External Exam      Right Left   External Normal Normal       Slit Lamp Exam      Right Left   Lids/Lashes Normal Normal   Conjunctiva/Sclera White and quiet White and quiet   Cornea Clear Clear   Anterior Chamber Deep and quiet Deep and quiet   Iris Round and reactive Round and reactive   Lens Posterior chamber intraocular lens Posterior chamber intraocular lens   Anterior Vitreous Normal Normal       Fundus Exam      Right Left   Posterior Vitreous Posterior vitreous detachment    Disc Normal    C/D Ratio 0.4    Macula Retinal pigment epithelial mottling, Hard drusen, Geographic atrophy into the FAZ, Advanced age related macular degeneration    Vessels Normal    Periphery Normal           IMAGING AND PROCEDURES  Imaging and Procedures for 10/31/19  OCT, Retina - OU - Both Eyes       Right Eye Quality was borderline. Scan locations included subfoveal. Central Foveal Thickness: 303. Progression has been stable. Findings include no SRF, no IRF, outer retinal atrophy, central retinal atrophy.   Left Eye Quality was good. Scan locations included  subfoveal. Central Foveal Thickness: 243. Progression has been stable.  Findings include outer retinal atrophy, central retinal atrophy.   Notes OD, dry ARMD, extensive, no active CNVM,   OS today 3 weeks status post intravitreal Avastin, much less active CNVM, follow-up as scheduled                ASSESSMENT/PLAN:  Exudative age-related macular degeneration of right eye with inactive choroidal neovascularization ([place]) Today is for 80-month follow-up of the right eye status post intravitreal Avastin for prior wet ARMD, now stable and involutional with extensive dry atrophic change and outer retinal scarring accounting for acuity.  There are no active neovascular edges to the large regional choriocapillaris dropout of dry ARMD  Advanced nonexudative age-related macular degeneration of right eye with subfoveal involvement No signs of wet ARMD today, follow dry ARMD as scheduled      ICD-10-CM   1. Exudative age-related macular degeneration of right eye with inactive choroidal neovascularization ([facility])  H35.3212 OCT, Retina - OU - Both Eyes  2. Advanced nonexudative age-related macular degeneration of right eye with subfoveal involvement  H35.3114     1.  OD, much less active CNVM, today at 13-month interval.  No active lesion.  Extensive dry atrophic RPE-choriocapillaris dropout in the FAZ accounts with acuity  2.  OS today 3 weeks status post Avastin, repeat examination OS as scheduled next  3.  Will coordinate dilation of each eye in approximately 3 months to coincide likely with possible injection left eye  Ophthalmic Meds Ordered this visit:  No orders of the defined types were placed in this encounter.      Return in about 3 months (around 01/31/2020) for DILATE OU, OCT.  There are no Patient Instructions on file for this visit.   Explained the diagnoses, plan, and follow up with the patient and they expressed understanding.  Patient expressed understanding of the  importance of proper follow up care.   Clent Demark Yezenia Fredrick M.D. Diseases & Surgery of the Retina and Vitreous Retina & Diabetic [place] 10/31/19     Abbreviations: M myopia (nearsighted); A astigmatism; H hyperopia (farsighted); P presbyopia; Mrx spectacle prescription;  CTL contact lenses; OD right eye; OS left eye; OU both eyes  XT exotropia; ET esotropia; PEK punctate epithelial keratitis; PEE punctate epithelial erosions; DES dry eye syndrome; MGD meibomian gland dysfunction; ATs artificial tears; PFAT's preservative free artificial tears; [place] nuclear sclerotic cataract; PSC posterior subcapsular cataract; ERM epi-retinal membrane; PVD posterior vitreous detachment; RD retinal detachment; DM diabetes mellitus; DR diabetic retinopathy; NPDR non-proliferative diabetic retinopathy; PDR proliferative diabetic retinopathy; CSME clinically significant macular edema; DME diabetic macular edema; dbh dot blot hemorrhages; CWS cotton wool spot; POAG primary open angle glaucoma; C/D cup-to-disc ratio; HVF humphrey visual field; GVF goldmann visual field; OCT optical coherence tomography; IOP intraocular pressure; BRVO Branch retinal vein occlusion; CRVO central retinal vein occlusion; CRAO central retinal artery occlusion; BRAO branch retinal artery occlusion; RT retinal tear; SB scleral buckle; PPV pars plana vitrectomy; VH Vitreous hemorrhage; PRP panretinal laser photocoagulation; IVK intravitreal kenalog; VMT vitreomacular traction; MH Macular hole;  NVD neovascularization of the disc; NVE neovascularization elsewhere; AREDS age related eye disease study; ARMD age related macular degeneration; POAG primary open angle glaucoma; EBMD epithelial/anterior basement membrane dystrophy; ACIOL anterior chamber intraocular lens; IOL intraocular lens; PCIOL posterior chamber intraocular lens; Phaco/IOL phacoemulsification with intraocular lens placement; [place] photorefractive keratectomy; LASIK laser assisted in situ  keratomileusis; HTN hypertension; DM diabetes mellitus; COPD chronic obstructive pulmonary disease

## 2019-11-21 ENCOUNTER — Ambulatory Visit (INDEPENDENT_AMBULATORY_CARE_PROVIDER_SITE_OTHER): Payer: Medicare Other | Admitting: *Deleted

## 2019-11-21 DIAGNOSIS — Z0000 Encounter for general adult medical examination without abnormal findings: Secondary | ICD-10-CM

## 2019-11-21 NOTE — Progress Notes (Signed)
MEDICARE ANNUAL WELLNESS VISIT  11/21/2019  Telephone Visit Disclaimer This Medicare AWV was conducted by telephone due to national recommendations for restrictions regarding the COVID-19 Pandemic (e.g. social distancing).  I verified, using two identifiers, that I am speaking with Ashley Peters or their authorized healthcare agent. I discussed the limitations, risks, security, and privacy concerns of performing an evaluation and management service by telephone and the potential availability of an in-person appointment in the future. The patient expressed understanding and agreed to proceed.  Location of Patient: home Location of Provider (nurse): in office  Subjective:    Ashley Peters is a 86 y.o. female patient of Dettinger, [name], MD who had a Medicare Annual Wellness Visit today via telephone. Ashley Peters is Retired and lives alone. she has  3 children. she reports that she is socially active and does interact with friends/family regularly. she is minimally physically active and enjoys quilting.  Patient Care Team: Dettinger, [name], MD as PCP - General (Family Medicine) Zadie Rhine Clent Demark, MD as Consulting Physician (Ophthalmology) Donnie Mesa, MD as Consulting Physician (General Surgery) Truitt Merle, MD as Consulting Physician (Hematology)  Advanced Directives 11/21/2019 08/26/2019 02/23/2019 11/17/2018 08/06/2017 04/05/2017 02/23/2017  Does Patient Have a Medical Advance Directive? No Yes Yes Yes Yes Yes Yes  Type of Advance Directive - - Living will [place];Living will [place];Living will [place];Living will [place];Living will  Does patient want to make changes to medical advance directive? - - - No - Patient declined No - Patient declined - -  Copy of [place] in Chart? - - - No - copy requested No - copy requested - No - copy requested  Would patient like information on creating  a medical advance directive? No - Patient declined - - - - - [facility] Utilization Over the Past 12 Months: # of hospitalizations or ER visits: 0 # of surgeries: 0  Review of Systems    Patient reports that her overall health is unchanged compared to last year.  History obtained from chart review and the patient  Patient Reported Readings (BP, Pulse, CBG, Weight, etc) none  Pain Assessment Pain : No/denies pain     Current Medications & Allergies (verified) Allergies as of 11/21/2019      Reactions   Benicar [olmesartan Medoxomil]    Elevated Potassium   Amoxicillin Rash   Has patient had a PCN reaction causing immediate rash, facial/tongue/throat swelling, SOB or lightheadedness with hypotension: Yes Has patient had a PCN reaction causing severe rash involving mucus membranes or skin necrosis: No Has patient had a PCN reaction that required hospitalization: No Has patient had a PCN reaction occurring within the last 10 years: No If all of the above answers are "NO", then may proceed with Cephalosporin use.      Medication List       Accurate as of November 21, 2019 10:10 AM. If you have any questions, ask your nurse or doctor.        acetaminophen 500 MG tablet Commonly known as: TYLENOL Take 2 tablets (1,000 mg total) by mouth every 6 (six) hours as needed for moderate pain.   alendronate 70 MG tablet Commonly known as: FOSAMAX TAKE 1 TABLET EVERY 7 DAYS WITH A FULL GLASS OF WATER ON AN EMPTY STOMACH   cholecalciferol 1000 units tablet Commonly known as: VITAMIN D Take 1,000 Units by mouth daily.  diphenoxylate-atropine 2.5-0.025 MG tablet Commonly known as: LOMOTIL Take 1 tablet 4 (four) times daily as needed by mouth for diarrhea or loose stools.   PRESERVISION AREDS 2 PO Take 1 capsule by mouth 2 (two) times daily.       History (reviewed): Past Medical History:  Diagnosis Date  . Anemia   . Blood transfusion without reported diagnosis   .  Cancer ([place])   . Colon cancer ([place]) dx'd 10/2016  . Diverticulosis   . Family history of breast cancer   . Family history of colon cancer   . Family history of uterine cancer   . GERD (gastroesophageal reflux disease)   . History of kidney stones   . Hyperlipidemia   . Hypertension   . Macular degeneration   . Osteopenia    Past Surgical History:  Procedure Laterality Date  . ABDOMINAL HYSTERECTOMY    . ablation tr Great saphenous vein  10/17/2009  . BREAST EXCISIONAL BIOPSY Bilateral 1971  . brest biopsy  1971  . CHOLECYSTECTOMY    . EPIGASTRIC HERNIA REPAIR N/A 10/14/2016   Procedure: REPAIR OF EPIGASTRIC VENTRAL HERNIA;  Surgeon: Donnie Mesa, MD;  Location: [facility];  Service: General;  Laterality: N/A;  . lt. distal ureteral stone extraction  09/14/1991  . PARTIAL COLECTOMY Right 10/14/2016   Procedure: OPEN RIGHT HEMICOLECTOMY;  Surgeon: Donnie Mesa, MD;  Location: [facility];  Service: General;  Laterality: Right;   Family History  Problem Relation Age of Onset  . Stroke Father 52  . Breast cancer Sister        dx 19's, died at 9  . Seizures Son   . Diabetes Brother   . Kidney disease Brother   . Heart disease Brother   . Epilepsy Son   . Colon cancer Other 61  . Uterine cancer Other 37  . Hodgkin's lymphoma Other    Social History   Socioeconomic History  . Marital status: Widowed    Spouse name: Not on file  . Number of children: 3  . Years of education: 39  . Highest education level: Some college, no degree  Occupational History  . Occupation: Retired    Comment: Network engineer  Tobacco Use  . Smoking status: Never Smoker  . Smokeless tobacco: Never Used  Vaping Use  . Vaping Use: Never used  Substance and Sexual Activity  . Alcohol use: No  . Drug use: No  . Sexual activity: Not Currently    Birth control/protection: None  Other Topics Concern  . Not on file  Social History Narrative  . Not on file   Social Determinants of Health   Financial Resource  Strain: Low Risk   . Difficulty of Paying Living Expenses: Not hard at all  Food Insecurity: No Food Insecurity  . Worried About Charity fundraiser in the Last Year: Never true  . Ran Out of Food in the Last Year: Never true  Transportation Needs: No Transportation Needs  . Lack of Transportation (Medical): No  . Lack of Transportation (Non-Medical): No  Physical Activity: Insufficiently Active  . Days of Exercise per Week: 7 days  . Minutes of Exercise per Session: 10 min  Stress: No Stress Concern Present  . Feeling of Stress : Only a little  Social Connections: Moderately Integrated  . Frequency of Communication with Friends and Family: More than three times a week  . Frequency of Social Gatherings with Friends and Family: More than three times a week  . Attends  Religious Services: More than 4 times per year  . Active Member of Clubs or Organizations: Yes  . Attends Archivist Meetings: 1 to 4 times per year  . Marital Status: Widowed    Activities of Daily Living In your present state of health, do you have any difficulty performing the following activities: 11/21/2019  Hearing? N  Vision? Y  Comment macular degeneration  Difficulty concentrating or making decisions? N  Walking or climbing stairs? N  Dressing or bathing? N  Doing errands, shopping? N  Preparing Food and eating ? N  Using the Toilet? N  In the past six months, have you accidently leaked urine? N  Do you have problems with loss of bowel control? N  Managing your Medications? N  Managing your Finances? N  Housekeeping or managing your Housekeeping? N  Some recent data might be hidden    Patient Education/ Literacy How often do you need to have someone help you when you read instructions, pamphlets, or other written materials from your doctor or pharmacy?: 1 - Never What is the last grade level you completed in school?: 12  Exercise Current Exercise Habits: The patient does not participate in  regular exercise at present, Exercise limited by: None identified  Diet Patient reports consuming 3 meals a day and 1 snack(s) a day Patient reports that her primary diet is: Regular Patient reports that she does have regular access to food.   Depression Screen PHQ 2/9 Scores 11/21/2019 08/22/2019 11/17/2018 08/25/2018 09/07/2017 08/06/2017 03/28/2017  PHQ - 2 Score 0 0 0 0 0 0 0     Fall Risk Fall Risk  11/21/2019 08/22/2019 11/17/2018 08/25/2018 09/07/2017  Falls in the past year? 0 0 0 0 No  Number falls in past yr: - - 0 - -  Injury with Fall? - - 0 - -  Follow up - - Falls prevention discussed - -  Comment - - get rid of all throw rugs in the house, adequate lighting in the walkways and grab bars in the bathroom - -     Objective:  Ashley Peters seemed alert and oriented and she participated appropriately during our telephone visit.  Blood Pressure Weight BMI  BP Readings from Last 3 Encounters:  08/26/19 (!) 166/77  08/22/19 130/80  02/23/19 (!) 156/79   Wt Readings from Last 3 Encounters:  08/26/19 134 lb 3.2 oz (60.9 kg)  08/22/19 133 lb (60.3 kg)  02/23/19 138 lb 11.2 oz (62.9 kg)   BMI Readings from Last 1 Encounters:  08/26/19 29.04 kg/m    *Unable to obtain current vital signs, weight, and BMI due to telephone visit type  Hearing/Vision  . [place] did not seem to have difficulty with hearing/understanding during the telephone conversation . Reports that she has had a formal eye exam by an eye care professional within the past year . Reports that she has not had a formal hearing evaluation within the past year *Unable to fully assess hearing and vision during telephone visit type  Cognitive Function: 6CIT Screen 11/21/2019 11/17/2018  What Year? 0 points 0 points  What month? 0 points 0 points  What time? 0 points 0 points  Count back from 20 0 points 0 points  Months in reverse 2 points 4 points  Repeat phrase 0 points 0 points  Total Score 2 4   (Normal:0-7,  Significant for Dysfunction: >8)  Normal Cognitive Function Screening: Yes   Immunization & Health Maintenance Record Immunization History  Administered Date(s) Administered  . DTaP 11/09/2003  . Fluad Quad(high Dose 65+) 12/07/2018  . Influenza Whole 12/10/2009  . Influenza, High Dose Seasonal PF 12/15/2016, 12/07/2017  . Influenza,inj,Quad PF,6+ Mos 12/15/2012, 12/22/2013, 12/13/2014, 12/25/2015  . PFIZER SARS-COV-2 Vaccination 04/16/2019, 05/10/2019  . Pneumococcal Conjugate-13 06/23/2016  . Pneumococcal Polysaccharide-23 03/16/2007  . Tdap 08/06/2017  . Zoster 12/17/2011    Health Maintenance  Topic Date Due  . INFLUENZA VACCINE  10/09/2019  . DEXA SCAN  02/21/2020 (Originally 08/08/2019)  . TETANUS/TDAP  08/07/2027  . COVID-19 Vaccine  Completed  . PNA vac Low Risk Adult  Completed       Assessment  This is a routine wellness examination for Ashley Peters.  Health Maintenance: Due or Overdue Health Maintenance Due  Topic Date Due  . INFLUENZA VACCINE  10/09/2019    Ashley Peters does not need a referral for Community Assistance: Care Management:   no Social Work:    no Prescription Assistance:  no Nutrition/Diabetes Education:  no   Plan:  Personalized Goals Goals Addressed            This Visit's Progress   . DIET - INCREASE WATER INTAKE       Try to drink 6-8 glasses of water daily, try harder    . Exercise 3x per week (30 min per time)   Not on track     Personalized Health Maintenance & Screening Recommendations  Influenza vaccine shingles vaccine  Lung Cancer Screening Recommended: no (Low Dose CT Chest recommended if Age 92-80 years, 30 pack-year currently smoking OR have quit w/in past 15 years) Hepatitis C Screening recommended: no HIV Screening recommended: no  Advanced Directives: Written information was not prepared per patient's request.  Referrals & Orders No orders of the defined types were placed in this  encounter.   Follow-up Plan . Follow-up with Dettinger, [name], MD as planned on 02/22/20. Marland Kitchen Pt is due for flu vaccine and shingles vaccine, she will get flu shot in October and discuss shingles vaccine at next OV. Marland Kitchen Pt remains independent of all ADL's, including driving. . Pt declined advanced directive information, states her family knows her wishes. . No difficulty with hearing. . Pt has macular degeneration and sees Dr. Zadie Rhine monthly. . Pt voices no healthcare concerns today. . AVS printed and mailed to pt.   I have personally reviewed and noted the following in the patient's chart:   . Medical and social history . Use of alcohol, tobacco or illicit drugs  . Current medications and supplements . Functional ability and status . Nutritional status . Physical activity . Advanced directives . List of other physicians . Hospitalizations, surgeries, and ER visits in previous 12 months . Vitals . Screenings to include cognitive, depression, and falls . Referrals and appointments  In addition, I have reviewed and discussed with Ashley Peters certain preventive protocols, quality metrics, and best practice recommendations. A written personalized care plan for preventive services as well as general preventive health recommendations is available and can be mailed to the patient at her request.      Rana Snare, LPN  06/21/2393

## 2019-11-24 ENCOUNTER — Ambulatory Visit (INDEPENDENT_AMBULATORY_CARE_PROVIDER_SITE_OTHER): Payer: Medicare Other

## 2019-11-24 ENCOUNTER — Other Ambulatory Visit: Payer: Self-pay

## 2019-11-24 DIAGNOSIS — E538 Deficiency of other specified B group vitamins: Secondary | ICD-10-CM | POA: Diagnosis not present

## 2019-11-29 ENCOUNTER — Other Ambulatory Visit: Payer: Self-pay | Admitting: Family Medicine

## 2019-12-01 ENCOUNTER — Encounter (INDEPENDENT_AMBULATORY_CARE_PROVIDER_SITE_OTHER): Payer: Medicare Other | Admitting: Ophthalmology

## 2019-12-07 ENCOUNTER — Encounter (INDEPENDENT_AMBULATORY_CARE_PROVIDER_SITE_OTHER): Payer: Self-pay | Admitting: Ophthalmology

## 2019-12-07 ENCOUNTER — Ambulatory Visit (INDEPENDENT_AMBULATORY_CARE_PROVIDER_SITE_OTHER): Payer: Medicare Other | Admitting: Ophthalmology

## 2019-12-07 ENCOUNTER — Other Ambulatory Visit: Payer: Self-pay

## 2019-12-07 DIAGNOSIS — H353114 Nonexudative age-related macular degeneration, right eye, advanced atrophic with subfoveal involvement: Secondary | ICD-10-CM | POA: Diagnosis not present

## 2019-12-07 DIAGNOSIS — H353212 Exudative age-related macular degeneration, right eye, with inactive choroidal neovascularization: Secondary | ICD-10-CM

## 2019-12-07 DIAGNOSIS — H353221 Exudative age-related macular degeneration, left eye, with active choroidal neovascularization: Secondary | ICD-10-CM | POA: Diagnosis not present

## 2019-12-07 MED ORDER — BEVACIZUMAB CHEMO INJECTION 1.25MG/0.05ML SYRINGE FOR KALEIDOSCOPE
1.2500 mg | INTRAVITREAL | Status: AC | PRN
  Administered 2019-12-07: 1.25 mg via INTRAVITREAL

## 2019-12-07 NOTE — Patient Instructions (Signed)
Patient asked to report any new onset visual acuity decline or distortion

## 2019-12-07 NOTE — Assessment & Plan Note (Signed)
OD no active CNVM today

## 2019-12-07 NOTE — Progress Notes (Signed)
12/07/2019     CHIEF COMPLAINT Patient presents for Retina Follow Up   HISTORY OF PRESENT ILLNESS: Ashley Peters is a 86 y.o. female who presents to the clinic today for:   HPI    Retina Follow Up    Patient presents with  Wet AMD.  In left eye.  This started 8 weeks ago.  Severity is mild.  Duration of 8 weeks.  Since onset it is stable.          Comments    8 Week AMD F/U OU, poss Avastin OS  Pt denies noticeable changes to [place] OU since last visit. Pt denies ocular pain, flashes of light, or floaters OU.         Last edited by [name], [place] on 12/07/2019  8:48 AM. (History)      Referring physician: Dettinger, [name], MD [place],  [place] [address]  HISTORICAL INFORMATION:   Selected notes from the MEDICAL RECORD NUMBER    Lab Results  Component Value Date   HGBA1C 5.7 02/04/2016     CURRENT MEDICATIONS: No current outpatient medications on file. (Ophthalmic Drugs)   No current facility-administered medications for this visit. (Ophthalmic Drugs)   Current Outpatient Medications (Other)  Medication Sig  . acetaminophen (TYLENOL) 500 MG tablet Take 2 tablets (1,000 mg total) by mouth every 6 (six) hours as needed for moderate pain.  Marland Kitchen alendronate (FOSAMAX) 70 MG tablet TAKE 1 TABLET EVERY 7 DAYS WITH A FULL GLASS OF WATER ON AN EMPTY STOMACH  . cholecalciferol (VITAMIN D) 1000 units tablet Take 1,000 Units by mouth daily.  . diphenoxylate-atropine (LOMOTIL) 2.5-0.025 MG tablet Take 1 tablet 4 (four) times daily as needed by mouth for diarrhea or loose stools.  . Multiple Vitamins-Minerals (PRESERVISION AREDS 2 PO) Take 1 capsule by mouth 2 (two) times daily.   Current Facility-Administered Medications (Other)  Medication Route  . cyanocobalamin ((VITAMIN B-12)) injection 1,000 mcg Intramuscular      REVIEW OF SYSTEMS:    ALLERGIES Allergies  Allergen Reactions  . Benicar [Olmesartan Medoxomil]     Elevated Potassium  .  Amoxicillin Rash    Has patient had a PCN reaction causing immediate rash, facial/tongue/throat swelling, SOB or lightheadedness with hypotension: Yes Has patient had a PCN reaction causing severe rash involving mucus membranes or skin necrosis: No Has patient had a PCN reaction that required hospitalization: No Has patient had a PCN reaction occurring within the last 10 years: No If all of the above answers are "NO", then may proceed with Cephalosporin use.    PAST MEDICAL HISTORY Past Medical History:  Diagnosis Date  . Anemia   . Blood transfusion without reported diagnosis   . Cancer ([place])   . Colon cancer ([place] [facility]) dx'd 10/2016  . Diverticulosis   . Family history of breast cancer   . Family history of colon cancer   . Family history of uterine cancer   . GERD (gastroesophageal reflux disease)   . History of kidney stones   . Hyperlipidemia   . Hypertension   . Macular degeneration   . Osteopenia    Past Surgical History:  Procedure Laterality Date  . ABDOMINAL HYSTERECTOMY    . ablation tr Great saphenous vein  10/17/2009  . BREAST EXCISIONAL BIOPSY Bilateral 1971  . brest biopsy  1971  . CHOLECYSTECTOMY    . EPIGASTRIC HERNIA REPAIR N/A 10/14/2016   Procedure: REPAIR OF EPIGASTRIC VENTRAL HERNIA;  Surgeon: Donnie Mesa,  MD;  Location: [facility];  Service: General;  Laterality: N/A;  . lt. distal ureteral stone extraction  09/14/1991  . PARTIAL COLECTOMY Right 10/14/2016   Procedure: OPEN RIGHT HEMICOLECTOMY;  Surgeon: Donnie Mesa, MD;  Location: [facility];  Service: General;  Laterality: Right;    FAMILY HISTORY Family History  Problem Relation Age of Onset  . Stroke Father 37  . Breast cancer Sister        dx 33's, died at 4  . Seizures Son   . Diabetes Brother   . Kidney disease Brother   . Heart disease Brother   . Epilepsy Son   . Colon cancer Other 84  . Uterine cancer Other 70  . Hodgkin's lymphoma Other     SOCIAL HISTORY Social History   Tobacco Use  .  Smoking status: Never Smoker  . Smokeless tobacco: Never Used  Vaping Use  . Vaping Use: Never used  Substance Use Topics  . Alcohol use: No  . Drug use: No         OPHTHALMIC EXAM:  Base Eye Exam    Visual Acuity (ETDRS)      Right Left   Dist cc 20/80 -3 20/100 -1   Dist ph cc NI 20/100 +1   Correction: Glasses       Tonometry (Tonopen, 8:48 AM)      Right Left   Pressure 12 13       Pupils      Pupils Dark Light Shape React APD   Right PERRL 4 3 Round Brisk None   Left PERRL 4 3 Round Brisk None       Visual Fields (Counting fingers)      Left Right    Full Full       Extraocular Movement      Right Left    Full Full       Neuro/Psych    Oriented x3: Yes   Mood/Affect: Normal       Dilation    Both eyes: 1.0% Mydriacyl, 2.5% Phenylephrine @ 8:52 AM        Slit Lamp and Fundus Exam    External Exam      Right Left   External Normal Normal       Slit Lamp Exam      Right Left   Lids/Lashes Normal Normal   Conjunctiva/Sclera White and quiet White and quiet   Cornea Clear Clear   Anterior Chamber Deep and quiet Deep and quiet   Iris Round and reactive Round and reactive   Lens Posterior chamber intraocular lens Posterior chamber intraocular lens   Anterior Vitreous Normal Normal       Fundus Exam      Right Left   Posterior Vitreous  Normal   Disc  Normal   C/D Ratio  0.25   Macula  Retinal pigment epithelial mottling, Hard drusen, Pigmented atrophy   Vessels  Normal   Periphery  Normal          IMAGING AND PROCEDURES  Imaging and Procedures for 12/07/19  OCT, Retina - OU - Both Eyes       Right Eye Quality was good. Scan locations included subfoveal. Central Foveal Thickness: 272. Progression has improved. Findings include abnormal foveal contour, outer retinal atrophy, central retinal atrophy, subretinal scarring.   Left Eye Quality was good. Scan locations included subfoveal. Central Foveal Thickness: 238. Progression has  improved. Findings include abnormal foveal contour, disciform scar, central retinal  atrophy, outer retinal atrophy, subretinal scarring.   Notes Much less less active CNVM OD and OS.  OS currently at 7-week follow-up interval.       Intravitreal Injection, Pharmacologic Agent - OS - Left Eye       Time Out 12/07/2019. 9:48 AM. Confirmed correct patient, procedure, site, and patient consented.   Anesthesia Topical anesthesia was used. Anesthetic medications included Akten 3.5%.   Procedure Preparation included Ofloxacin , 10% betadine to eyelids, 5% betadine to ocular surface. A supplied needle was used.   Injection:  1.25 mg Bevacizumab (AVASTIN) SOLN   NDC: [address]-9, Lot: [id number]   Route: Intravitreal, Site: Left Eye, Waste: 0 mg  Post-op Post injection exam found visual acuity of at least counting fingers. The patient tolerated the procedure well. There were no complications. The patient received written and verbal post procedure care education. Post injection medications were not given.                 ASSESSMENT/PLAN:  Advanced nonexudative age-related macular degeneration of right eye with subfoveal involvement OD no active CNVM today  Exudative age-related macular degeneration of right eye with inactive choroidal neovascularization (HCC) Stable, no therapy for over 4 months  Exudative age-related macular degeneration of left eye with active choroidal neovascularization (HCC) Stable currently at 7-week interval.  We will repeat injection today and extend interval to 8-week examination OU      ICD-10-CM   1. Exudative age-related macular degeneration of left eye with active choroidal neovascularization (HCC)  H35.3221 OCT, Retina - OU - Both Eyes    Intravitreal Injection, Pharmacologic Agent - OS - Left Eye    Bevacizumab (AVASTIN) SOLN 1.25 mg  2. Exudative age-related macular degeneration of right eye with inactive choroidal neovascularization (HCC)   H35.3212 OCT, Retina - OU - Both Eyes  3. Advanced nonexudative age-related macular degeneration of right eye with subfoveal involvement  H35.3114     1.  Repeat intravitreal Avastin OS today  2.  Examination OU in 8 weeks, likely treat intravitreal Avastin left eye at that time  3.  Ophthalmic Meds Ordered this visit:  Meds ordered this encounter  Medications  . Bevacizumab (AVASTIN) SOLN 1.25 mg       Return for As scheduled, DILATE OU, OCT, AVASTIN OCT, OS.  Patient Instructions  Patient asked to report any new onset visual acuity decline or distortion    Explained the diagnoses, plan, and follow up with the patient and they expressed understanding.  Patient expressed understanding of the importance of proper follow up care.   Clent Demark Hildagarde Holleran M.D. Diseases & Surgery of the Retina and Vitreous Retina & Diabetic [place] 12/07/19     Abbreviations: M myopia (nearsighted); A astigmatism; H hyperopia (farsighted); P presbyopia; Mrx spectacle prescription;  CTL contact lenses; OD right eye; OS left eye; OU both eyes  XT exotropia; ET esotropia; PEK punctate epithelial keratitis; PEE punctate epithelial erosions; DES dry eye syndrome; MGD meibomian gland dysfunction; ATs artificial tears; PFAT's preservative free artificial tears; [place] nuclear sclerotic cataract; PSC posterior subcapsular cataract; ERM epi-retinal membrane; PVD posterior vitreous detachment; RD retinal detachment; DM diabetes mellitus; DR diabetic retinopathy; NPDR non-proliferative diabetic retinopathy; PDR proliferative diabetic retinopathy; CSME clinically significant macular edema; DME diabetic macular edema; dbh dot blot hemorrhages; CWS cotton wool spot; POAG primary open angle glaucoma; C/D cup-to-disc ratio; HVF humphrey visual field; GVF goldmann visual field; OCT optical coherence tomography; IOP intraocular pressure; BRVO Branch retinal vein occlusion; CRVO central retinal  vein occlusion; CRAO central  retinal artery occlusion; BRAO branch retinal artery occlusion; RT retinal tear; SB scleral buckle; PPV pars plana vitrectomy; VH Vitreous hemorrhage; PRP panretinal laser photocoagulation; IVK intravitreal kenalog; VMT vitreomacular traction; MH Macular hole;  NVD neovascularization of the disc; NVE neovascularization elsewhere; AREDS age related eye disease study; ARMD age related macular degeneration; POAG primary open angle glaucoma; EBMD epithelial/anterior basement membrane dystrophy; ACIOL anterior chamber intraocular lens; IOL intraocular lens; PCIOL posterior chamber intraocular lens; Phaco/IOL phacoemulsification with intraocular lens placement; [place] photorefractive keratectomy; LASIK laser assisted in situ keratomileusis; HTN hypertension; DM diabetes mellitus; COPD chronic obstructive pulmonary disease

## 2019-12-07 NOTE — Assessment & Plan Note (Signed)
Stable currently at 7-week interval.  We will repeat injection today and extend interval to 8-week examination OU

## 2019-12-07 NOTE — Assessment & Plan Note (Signed)
Stable, no therapy for over 4 months

## 2019-12-08 ENCOUNTER — Other Ambulatory Visit: Payer: Self-pay | Admitting: Family Medicine

## 2019-12-08 DIAGNOSIS — Z1231 Encounter for screening mammogram for malignant neoplasm of breast: Secondary | ICD-10-CM

## 2019-12-13 DIAGNOSIS — Z23 Encounter for immunization: Secondary | ICD-10-CM | POA: Diagnosis not present

## 2019-12-27 ENCOUNTER — Ambulatory Visit (INDEPENDENT_AMBULATORY_CARE_PROVIDER_SITE_OTHER): Payer: Medicare Other | Admitting: Family Medicine

## 2019-12-27 ENCOUNTER — Other Ambulatory Visit: Payer: Self-pay

## 2019-12-27 DIAGNOSIS — Z23 Encounter for immunization: Secondary | ICD-10-CM | POA: Diagnosis not present

## 2019-12-27 DIAGNOSIS — E538 Deficiency of other specified B group vitamins: Secondary | ICD-10-CM

## 2020-01-10 ENCOUNTER — Ambulatory Visit: Payer: Medicare Other

## 2020-02-01 ENCOUNTER — Encounter (INDEPENDENT_AMBULATORY_CARE_PROVIDER_SITE_OTHER): Payer: Medicare Other | Admitting: Ophthalmology

## 2020-02-06 ENCOUNTER — Encounter (INDEPENDENT_AMBULATORY_CARE_PROVIDER_SITE_OTHER): Payer: Medicare Other | Admitting: Ophthalmology

## 2020-02-07 ENCOUNTER — Other Ambulatory Visit: Payer: Self-pay

## 2020-02-07 ENCOUNTER — Ambulatory Visit (INDEPENDENT_AMBULATORY_CARE_PROVIDER_SITE_OTHER): Payer: Medicare Other | Admitting: Ophthalmology

## 2020-02-07 ENCOUNTER — Encounter (INDEPENDENT_AMBULATORY_CARE_PROVIDER_SITE_OTHER): Payer: Self-pay | Admitting: Ophthalmology

## 2020-02-07 DIAGNOSIS — H353114 Nonexudative age-related macular degeneration, right eye, advanced atrophic with subfoveal involvement: Secondary | ICD-10-CM | POA: Diagnosis not present

## 2020-02-07 DIAGNOSIS — H353221 Exudative age-related macular degeneration, left eye, with active choroidal neovascularization: Secondary | ICD-10-CM

## 2020-02-07 MED ORDER — BEVACIZUMAB CHEMO INJECTION 1.25MG/0.05ML SYRINGE FOR KALEIDOSCOPE
1.2500 mg | INTRAVITREAL | Status: AC | PRN
  Administered 2020-02-07: 1.25 mg via INTRAVITREAL

## 2020-02-07 NOTE — Progress Notes (Signed)
02/07/2020     CHIEF COMPLAINT Patient presents for Retina Follow Up   HISTORY OF PRESENT ILLNESS: Ashley Peters is a 86 y.o. female who presents to the clinic today for:   HPI    Retina Follow Up    Patient presents with  Wet AMD.  In both eyes.  This started 2 months ago.  Duration of 2 months.  Since onset it is stable.          Comments    2 MO F/U OU, POSS AVASTIN OS.    Pt reports stable vision, no f/f, no pain or pressure.        Last edited by Ashley Peters D on 02/07/2020  8:18 AM. (History)      Referring physician: Dettinger, Ashley Kaufmann, MD [place],  [place] [address]  HISTORICAL INFORMATION:   Selected notes from the MEDICAL RECORD NUMBER    Lab Results  Component Value Date   HGBA1C 5.7 02/04/2016     CURRENT MEDICATIONS: No current outpatient medications on file. (Ophthalmic Drugs)   No current facility-administered medications for this visit. (Ophthalmic Drugs)   Current Outpatient Medications (Other)  Medication Sig  . acetaminophen (TYLENOL) 500 MG tablet Take 2 tablets (1,000 mg total) by mouth every 6 (six) hours as needed for moderate pain.  Marland Kitchen alendronate (FOSAMAX) 70 MG tablet TAKE 1 TABLET EVERY 7 DAYS WITH A FULL GLASS OF WATER ON AN EMPTY STOMACH  . cholecalciferol (VITAMIN D) 1000 units tablet Take 1,000 Units by mouth daily.  . diphenoxylate-atropine (LOMOTIL) 2.5-0.025 MG tablet Take 1 tablet 4 (four) times daily as needed by mouth for diarrhea or loose stools.  . Multiple Vitamins-Minerals (PRESERVISION AREDS 2 PO) Take 1 capsule by mouth 2 (two) times daily.   Current Facility-Administered Medications (Other)  Medication Route  . cyanocobalamin ((VITAMIN B-12)) injection 1,000 mcg Intramuscular      REVIEW OF SYSTEMS:    ALLERGIES Allergies  Allergen Reactions  . Benicar [Olmesartan Medoxomil]     Elevated Potassium  . Amoxicillin Rash    Has patient had a PCN reaction causing immediate rash,  facial/tongue/throat swelling, SOB or lightheadedness with hypotension: Yes Has patient had a PCN reaction causing severe rash involving mucus membranes or skin necrosis: No Has patient had a PCN reaction that required hospitalization: No Has patient had a PCN reaction occurring within the last 10 years: No If all of the above answers are "NO", then may proceed with Cephalosporin use.    PAST MEDICAL HISTORY Past Medical History:  Diagnosis Date  . Anemia   . Blood transfusion without reported diagnosis   . Cancer ([facility])   . Colon cancer ([facility]) dx'd 10/2016  . Diverticulosis   . Family history of breast cancer   . Family history of colon cancer   . Family history of uterine cancer   . GERD (gastroesophageal reflux disease)   . History of kidney stones   . Hyperlipidemia   . Hypertension   . Macular degeneration   . Osteopenia    Past Surgical History:  Procedure Laterality Date  . ABDOMINAL HYSTERECTOMY    . ablation tr Great saphenous vein  10/17/2009  . BREAST EXCISIONAL BIOPSY Bilateral 1971  . brest biopsy  1971  . CHOLECYSTECTOMY    . EPIGASTRIC HERNIA REPAIR N/A 10/14/2016   Procedure: REPAIR OF EPIGASTRIC VENTRAL HERNIA;  Surgeon: Ashley Mesa, MD;  Location: [facility];  Service: General;  Laterality: N/A;  . lt.  distal ureteral stone extraction  09/14/1991  . PARTIAL COLECTOMY Right 10/14/2016   Procedure: OPEN RIGHT HEMICOLECTOMY;  Surgeon: Ashley Mesa, MD;  Location: [facility];  Service: General;  Laterality: Right;    FAMILY HISTORY Family History  Problem Relation Age of Onset  . Stroke Father 98  . Breast cancer Sister        dx 47's, died at 58  . Seizures Son   . Diabetes Brother   . Kidney disease Brother   . Heart disease Brother   . Epilepsy Son   . Colon cancer Other 1  . Uterine cancer Other 69  . Hodgkin's lymphoma Other     SOCIAL HISTORY Social History   Tobacco Use  . Smoking status: Never Smoker  . Smokeless tobacco: Never Used  Vaping Use    . Vaping Use: Never used  Substance Use Topics  . Alcohol use: No  . Drug use: No         OPHTHALMIC EXAM:  Base Eye Exam    Visual Acuity (ETDRS)      Right Left   Dist cc 20/200 +1 20/200 +1   Dist ph cc NI 20/200 +2   Correction: Glasses       Tonometry (Tonopen, 8:16 AM)      Right Left   Pressure 14 13       Pupils      Pupils Dark Light Shape React APD   Right PERRL 4 3 Round Brisk None   Left PERRL 4 3 Round Brisk None       Visual Fields (Counting fingers)      Left Right    Full Full       Extraocular Movement      Right Left    Full Full       Neuro/Psych    Oriented x3: Yes   Mood/Affect: Normal       Dilation    Both eyes: 1.0% Mydriacyl, 2.5% Phenylephrine @ 8:18 AM        Slit Lamp and Fundus Exam    External Exam      Right Left   External Normal Normal       Slit Lamp Exam      Right Left   Lids/Lashes Normal Normal   Conjunctiva/Sclera White and quiet White and quiet   Cornea Clear Clear   Anterior Chamber Deep and quiet Deep and quiet   Iris Round and reactive Round and reactive   Lens Posterior chamber intraocular lens Posterior chamber intraocular lens   Anterior Vitreous Normal Normal       Fundus Exam      Right Left   Posterior Vitreous Posterior vitreous detachment Normal   Disc Normal Normal   C/D Ratio 0.45 0.25   Macula Retinal pigment epithelial mottling, Hard drusen, Geographic atrophy into the FAZ, Advanced age related macular degeneration, [place] atrophy, no macular thickening, no hemorrhage, no exudates Retinal pigment epithelial mottling, Hard drusen, Pigmented atrophy   Vessels Normal Normal   Periphery Normal Normal          IMAGING AND PROCEDURES  Imaging and Procedures for 02/07/20  OCT, Retina - OU - Both Eyes       Right Eye Quality was good. Scan locations included subfoveal. Progression has been stable. Findings include abnormal foveal contour, central retinal atrophy, outer retinal  atrophy, subretinal hyper-reflective material.   Left Eye Quality was good. Scan locations included subfoveal. Central Foveal Thickness: 286. Progression  has been stable. Findings include abnormal foveal contour, subretinal hyper-reflective material, subretinal scarring, outer retinal tubulation, outer retinal atrophy, central retinal atrophy.        Intravitreal Injection, Pharmacologic Agent - OS - Left Eye       Time Out 02/07/2020. 9:04 AM. Confirmed correct patient, procedure, site, and patient consented.   Anesthesia Topical anesthesia was used. Anesthetic medications included Akten 3.5%.   Procedure Preparation included Ofloxacin , 10% betadine to eyelids, 5% betadine to ocular surface. A 30 gauge needle was used.   Injection:  1.25 mg Bevacizumab (AVASTIN) SOLN   NDC: 70360-001-02, Lot: [id number]   Route: Intravitreal, Site: Left Eye, Waste: 0 mg  Post-op Post injection exam found visual acuity of at least counting fingers. The patient tolerated the procedure well. There were no complications. The patient received written and verbal post procedure care education. Post injection medications were not given.                 ASSESSMENT/PLAN:  Advanced nonexudative age-related macular degeneration of right eye with subfoveal involvement Accounts for acuity as the wet ARMD OS is under control      ICD-10-CM   1. Exudative age-related macular degeneration of left eye with active choroidal neovascularization (HCC)  H35.3221 OCT, Retina - OU - Both Eyes    Intravitreal Injection, Pharmacologic Agent - OS - Left Eye    Bevacizumab (AVASTIN) SOLN 1.25 mg  2. Advanced nonexudative age-related macular degeneration of right eye with subfoveal involvement  H35.3114     1.  2.  3.  Ophthalmic Meds Ordered this visit:  Meds ordered this encounter  Medications  . Bevacizumab (AVASTIN) SOLN 1.25 mg       Return in about 10 weeks (around 04/17/2020) for DILATE OU,  AVASTIN OCT, OS.  Patient Instructions  Patient instructed to contact the office promptly for new onset visual acuity decline or distortion    Explained the diagnoses, plan, and follow up with the patient and they expressed understanding.  Patient expressed understanding of the importance of proper follow up care.   Clent Demark Pinkney Venard M.D. Diseases & Surgery of the Retina and Vitreous Retina & Diabetic [place] 02/07/20     Abbreviations: M myopia (nearsighted); A astigmatism; H hyperopia (farsighted); P presbyopia; Mrx spectacle prescription;  CTL contact lenses; OD right eye; OS left eye; OU both eyes  XT exotropia; ET esotropia; PEK punctate epithelial keratitis; PEE punctate epithelial erosions; DES dry eye syndrome; MGD meibomian gland dysfunction; ATs artificial tears; PFAT's preservative free artificial tears; [place] nuclear sclerotic cataract; PSC posterior subcapsular cataract; ERM epi-retinal membrane; PVD posterior vitreous detachment; RD retinal detachment; DM diabetes mellitus; DR diabetic retinopathy; NPDR non-proliferative diabetic retinopathy; PDR proliferative diabetic retinopathy; CSME clinically significant macular edema; DME diabetic macular edema; dbh dot blot hemorrhages; CWS cotton wool spot; POAG primary open angle glaucoma; C/D cup-to-disc ratio; HVF humphrey visual field; GVF goldmann visual field; OCT optical coherence tomography; IOP intraocular pressure; BRVO Branch retinal vein occlusion; CRVO central retinal vein occlusion; CRAO central retinal artery occlusion; BRAO branch retinal artery occlusion; RT retinal tear; SB scleral buckle; PPV pars plana vitrectomy; VH Vitreous hemorrhage; PRP panretinal laser photocoagulation; IVK intravitreal kenalog; VMT vitreomacular traction; MH Macular hole;  NVD neovascularization of the disc; NVE neovascularization elsewhere; AREDS age related eye disease study; ARMD age related macular degeneration; POAG primary open angle glaucoma;  EBMD epithelial/anterior basement membrane dystrophy; ACIOL anterior chamber intraocular lens; IOL intraocular lens; PCIOL posterior chamber intraocular lens; Phaco/IOL  phacoemulsification with intraocular lens placement; [place] photorefractive keratectomy; LASIK laser assisted in situ keratomileusis; HTN hypertension; DM diabetes mellitus; COPD chronic obstructive pulmonary disease

## 2020-02-07 NOTE — Assessment & Plan Note (Signed)
Accounts for acuity as the wet ARMD OS is under control

## 2020-02-07 NOTE — Patient Instructions (Signed)
Patient instructed to contact the office promptly for new onset visual acuity decline or distortion 

## 2020-02-10 ENCOUNTER — Other Ambulatory Visit: Payer: Self-pay | Admitting: Family Medicine

## 2020-02-15 ENCOUNTER — Other Ambulatory Visit: Payer: Self-pay

## 2020-02-15 ENCOUNTER — Ambulatory Visit
Admission: RE | Admit: 2020-02-15 | Discharge: 2020-02-15 | Disposition: A | Discharge status: Home / Self Care | Payer: Medicare Other | Source: Ambulatory Visit | Attending: Family Medicine | Admitting: Family Medicine

## 2020-02-15 DIAGNOSIS — Z1231 Encounter for screening mammogram for malignant neoplasm of breast: Secondary | ICD-10-CM

## 2020-02-22 ENCOUNTER — Encounter: Payer: Self-pay | Admitting: Family Medicine

## 2020-02-22 ENCOUNTER — Other Ambulatory Visit: Payer: Self-pay

## 2020-02-22 ENCOUNTER — Ambulatory Visit (INDEPENDENT_AMBULATORY_CARE_PROVIDER_SITE_OTHER): Payer: Medicare Other | Admitting: Family Medicine

## 2020-02-22 ENCOUNTER — Ambulatory Visit (INDEPENDENT_AMBULATORY_CARE_PROVIDER_SITE_OTHER): Payer: Medicare Other

## 2020-02-22 VITALS — BP 144/77 | HR 78 | Ht <= 58 in | Wt 133.0 lb

## 2020-02-22 DIAGNOSIS — E559 Vitamin D deficiency, unspecified: Secondary | ICD-10-CM | POA: Diagnosis not present

## 2020-02-22 DIAGNOSIS — K219 Gastro-esophageal reflux disease without esophagitis: Secondary | ICD-10-CM | POA: Diagnosis not present

## 2020-02-22 DIAGNOSIS — I10 Essential (primary) hypertension: Secondary | ICD-10-CM | POA: Diagnosis not present

## 2020-02-22 DIAGNOSIS — E538 Deficiency of other specified B group vitamins: Secondary | ICD-10-CM | POA: Diagnosis not present

## 2020-02-22 DIAGNOSIS — M810 Age-related osteoporosis without current pathological fracture: Secondary | ICD-10-CM

## 2020-02-22 DIAGNOSIS — E782 Mixed hyperlipidemia: Secondary | ICD-10-CM

## 2020-02-22 MED ORDER — ALENDRONATE SODIUM 70 MG PO TABS: ORAL_TABLET | ORAL | 3 refills | Status: DC

## 2020-02-22 NOTE — Progress Notes (Signed)
[place]   [phone] [phone]   Clinic Follow up Note   Patient Care Team: Dettinger, [name], MD as PCP - General (Family Medicine) Zadie Rhine Clent Demark, MD as Consulting Physician (Ophthalmology) Donnie Mesa, MD as Consulting Physician (General Surgery) Truitt Merle, MD as Consulting Physician (Hematology)  Date of Service:  02/24/2020  CHIEF COMPLAINT: F/u on right colon cancer  SUMMARY OF ONCOLOGIC HISTORY: Oncology History Overview Note  Cancer Staging Cancer of right colon [facility]) Staging form: Colon and Rectum, AJCC 8th Edition - Pathologic stage from 10/14/2016: Stage IIC (pT4b, pN0, cM0) - Signed by Truitt Merle, MD on 11/07/2016     Cancer of right colon ([place])  09/19/2016 Imaging   CT A/P 09/19/16 IMPRESSION: Large 9.5 cm intraluminal mass in the cecum and ascending colon, consistent with colon carcinoma. Tumor extension into adjacent pericolonic fat seen along the lateral wall.  Mild right pericolonic and mesenteric lymphadenopathy, suspicious for metastatic disease. No other sites of metastatic disease identified.  Colonic diverticulosis, without radiographic evidence of diverticulitis. Tiny hiatal hernia, and small epigastric ventral hernia containing only fat.  Aortic atherosclerosis.   10/14/2016 Initial Diagnosis   Cancer of right colon ([place])   10/14/2016 Surgery   OPEN RIGHT HEMICOLECTOMY and REPAIR OF EPIGASTRIC VENTRAL HERNIA by Dr. Georgette Dover and Dr. Dalbert Batman    10/14/2016 Pathology Results   10/14/16 Diagnosis Colon, segmental resection for tumor, Right ADENOCARCINOMA WITH EXTENSIVE EXTRA CELLULAR MUCIN, GRADE 1, (11.0 CM) THE TUMOR INVADES THROUGH THE CECUM WALL INTO ADJACENT TERMINAL ILEUM (PT4B) NINETEEN BENIGN LYMPH NODES (0/19) SUPPURATIVE INFLAMMATION WITH FIBROSIS AND ADHESION TO ABDOMINAL WALL NO ADENOCARCINOMA IDENTIFIED   11/19/2016 Imaging   CT Chest WO Contrast 11/19/16 IMPRESSION: 1. No evidence of  metastatic disease. 2. Aortic atherosclerosis (ICD10-170.0). Coronary artery calcification.   11/25/2016 - 03/24/2017 Chemotherapy   Adjuvant Xeloda two weeks on, one week off starting 11/25/16 for 3 months     12/21/2016 Genetic Testing   Patient had genetic testing due to a personal history of colon cancer and a family history of breast, uterine, and colon cancer. The Common Hereditary Cancers Panel was ordered.  The Hereditary Gene Panel offered by [facility] includes sequencing and/or deletion duplication testing of the following 47 genes: APC, ATM, AXIN2, BARD1, BMPR1A, BRCA1, BRCA2, BRIP1, CDH1, CDKN2A (p14ARF), CDKN2A (p16INK4a), CHEK2, CDK4, CTNNA1, DICER1, EPCAM (Deletion/duplication testing only), GREM1 (promoter region deletion/duplication testing only), KIT, MEN1, MLH1, MSH2, MSH3, MSH6, MUTYH, NBN, NF1, NHTL1, PALB2, PDGFRA, PMS2, POLD1, POLE, PTEN, RAD50, RAD51C, RAD51D, SDHB, SDHC, SDHD, SMAD4, SMARCA4. STK11, TP53, TSC1, TSC2, and VHL.  The following genes were evaluated for sequence changes only: SDHA and HOXB13 c.251G>A variant only.  Results: Negative- no pathogenic variants identified.  The date of this test report is 12/21/2016.    04/27/2017 Imaging   CT AP W Contrast 04/27/17 IMPRESSION: Status post right hemicolectomy. No evidence of recurrent or metastatic disease. Additional stable ancillary findings as above.   08/07/2017 Imaging   08/07/2017 DEXA ASSESSMENT: The BMD measured at Forearm Radius 33% is 0.543 g/cm2 with a T-score of -3.9. This patient is considered osteoporotic according to [facility]) criteria.   11/25/2017 Imaging    11/25/2017 CT CAP IMPRESSION: 1. Abnormal appearance of the right lobe of the liver, with development of progressive peripheral intrahepatic duct dilatation with ill definition of the more central right hepatic duct. This appearance is indeterminate. Considerations include an incidental primary liver lesion such as  cholangiocarcinoma, an otherwise occult liver  metastasis with secondary biliary duct dilatation, or infectious/inflammatory cholangitis. If this 86 year old can undergo MRI/MRCP, this should be considered. Multiphase CT (with delayed phase imaging to evaluate for possible cholangiocarcinoma) and/or focused ultrasound would be alternate imaging strategies. 2. Otherwise, no evidence of metastatic disease in the chest, abdomen, or pelvis. 3. Coronary artery atherosclerosis. Aortic Atherosclerosis (ICD10-I70.0).   12/10/2017 Imaging   12/10/2017 MRI Abdomen IMPRESSION: 1. Stricturing of the central bile ducts of segment V RIGHT hepatic lobe without clear lesion identified. Differential would include benign and malignant stricturing including cholangiocarcinoma. Evaluation is hampered by clip artifact from cholecystectomy clips and duodenum gas. FDG PET scan may or may not be helpful in identify lesion. ERCP would be a second option to evaluate this central obstruction. 2. No enhancing lesion the in the liver parenchyma parenchyma. Altered perfusion related to the biliary obstruction.   12/23/2017 Procedure   12/23/2017 Colonoscopy Impressions: - Decreased sphincter tone found on digital rectal exam. - Two 3 mm polyps in the rectum and in the descending colon, removed with a cold snare. Resected and retrieved. - One 8 mm polyp in the rectum, removed with a hot snare. Resected and retrieved. - Diverticulosis in the entire examined colon. - The examination was otherwise normal on direct and retroflexion views.   08/24/2018 Imaging   CT AP  IMPRESSION: 1. Over the last 9 months there has been a similar not significantly progressive appearance of biliary dilatation and complexity in segment 5 of the liver along with mild intrahepatic biliary dilatation elsewhere, and moderate extrahepatic biliary dilatation. Some of this is likely postinflammatory; the most complex and dilated bile duct  did not have definite enhancement on the recent MRI to definitively indicate cholangiocarcinoma or metastatic lesion but the appearance of the right hepatic lobe is progressive from 09/19/2016 and from 04/27/2017. In the context of the patient's colon cancer, nuclear medicine PET-CT may provide some helpful adjunct information given this unusual appearance. 2. Other imaging findings of potential clinical significance: Mitral calcification. Aortoiliac atherosclerotic vascular disease. Renal cysts. Trace amount of gas in the urinary bladder, query recent catheterization. Descending and sigmoid colon diverticulosis. Two ventral hernias are enlarged compared to the prior exam but lax with wide ostium. No overt pathologic adenopathy currently.   08/24/2019 Imaging   CT CAP w contrast  IMPRESSION: 1. Right hemicolectomy. No evidence of recurrent or metastatic disease. 2. Intrahepatic/extrahepatic biliary ductal dilatation, unchanged from 11/25/2017. 3. Multiple midline supraumbilical ventral hernias contain unobstructed bowel or fat. 4. Aortic atherosclerosis (ICD10-I70.0). Coronary artery calcification.      CURRENT THERAPY:  Surveillance  INTERVAL HISTORY:  Ashley Peters is here for a follow up of colon cancer. She was last seen by me 6 months ago. She presents to the clinic with her daughter.  She is clinically doing very well, denies any issues with her bowel movement, except occasional diarrhea.  No hematochezia or melena.  She has good appetite, energy level is decent.  She lives at home, functions very well controlled still drives.  She did have macular degeneration, but her distance vision is stable good.   All other systems were reviewed with the patient and are negative.  MEDICAL HISTORY:  Past Medical History:  Diagnosis Date  . Anemia   . Blood transfusion without reported diagnosis   . Cancer ([place])   . Colon cancer ([place]) dx'd 10/2016  . Diverticulosis   . Family  history of breast cancer   . Family history of colon cancer   .  Family history of uterine cancer   . GERD (gastroesophageal reflux disease)   . History of kidney stones   . Hyperlipidemia   . Hypertension   . Macular degeneration   . Osteopenia     SURGICAL HISTORY: Past Surgical History:  Procedure Laterality Date  . ABDOMINAL HYSTERECTOMY    . ablation tr Great saphenous vein  10/17/2009  . BREAST EXCISIONAL BIOPSY Bilateral 1971  . brest biopsy  1971  . CHOLECYSTECTOMY    . EPIGASTRIC HERNIA REPAIR N/A 10/14/2016   Procedure: REPAIR OF EPIGASTRIC VENTRAL HERNIA;  Surgeon: Donnie Mesa, MD;  Location: [place];  Service: General;  Laterality: N/A;  . lt. distal ureteral stone extraction  09/14/1991  . PARTIAL COLECTOMY Right 10/14/2016   Procedure: OPEN RIGHT HEMICOLECTOMY;  Surgeon: Donnie Mesa, MD;  Location: [place];  Service: General;  Laterality: Right;    I have reviewed the social history and family history with the patient and they are unchanged from previous note.  ALLERGIES:  is allergic to benicar [olmesartan medoxomil] and amoxicillin.  MEDICATIONS:  Current Outpatient Medications  Medication Sig Dispense Refill  . acetaminophen (TYLENOL) 500 MG tablet Take 2 tablets (1,000 mg total) by mouth every 6 (six) hours as needed for moderate pain. 30 tablet 0  . alendronate (FOSAMAX) 70 MG tablet Take with a full glass of water on an empty stomach. 12 tablet 3  . cholecalciferol (VITAMIN D) 1000 units tablet Take 1,000 Units by mouth daily.    . Multiple Vitamins-Minerals (PRESERVISION AREDS 2 PO) Take 1 capsule by mouth 2 (two) times daily.     Current Facility-Administered Medications  Medication Dose Route Frequency Provider Last Rate Last Admin  . cyanocobalamin ((VITAMIN B-12)) injection 1,000 mcg  1,000 mcg Intramuscular Q30 days Dettinger, [name], MD   1,000 mcg at 12/27/19 2229    PHYSICAL EXAMINATION: ECOG PERFORMANCE STATUS: 0 - Asymptomatic  Vitals:    02/24/20 0758  BP: (!) 179/73  Pulse: 84  Resp: 20  Temp: 97.8 F (36.6 C)  SpO2: 98%   Filed Weights   02/24/20 0758  Weight: 133 lb 11.2 oz (60.6 kg)    GENERAL:alert, no distress and comfortable SKIN: skin color, texture, turgor are normal, no rashes or significant lesions EYES: normal, Conjunctiva are pink and non-injected, sclera clear NECK: supple, thyroid normal size, non-tender, without nodularity LYMPH:  no palpable lymphadenopathy in the cervical, axillary  LUNGS: clear to auscultation and percussion with normal breathing effort HEART: regular rate & rhythm and no murmurs and no lower extremity edema ABDOMEN:abdomen soft, non-tender and normal bowel sounds Musculoskeletal:no cyanosis of digits and no clubbing  NEURO: alert & oriented x 3 with fluent speech, no focal motor/sensory deficits  LABORATORY DATA:  I have reviewed the data as listed CBC Latest Ref Rng & Units 02/24/2020 02/22/2020 08/22/2019  WBC 4.0 - 10.5 K/uL 7.4 5.5 5.6  Hemoglobin 12.0 - 15.0 g/dL 13.9 14.6 15.1  Hematocrit 36.0 - 46.0 % 42.5 43.8 45.1  Platelets 150 - 400 K/uL 184 209 218     CMP Latest Ref Rng & Units 02/24/2020 02/22/2020 08/22/2019  Glucose 70 - 99 mg/dL 106(H) 99 83  BUN 8 - 23 mg/dL _0 Creatinine 0.44 - 1.00 mg/dL 0.76 0.78 0.70  Sodium 135 - 145 mmol/L 141 143 144  Potassium 3.5 - 5.1 mmol/L 3.9 4.1 4.0  Chloride 98 - 111 mmol/L 109 107(H) 105  CO2 22 - 32 mmol/L _1 Calcium  8.9 - 10.3 mg/dL 9.2 9.1 9.7  Total Protein 6.5 - 8.1 g/dL 6.9 6.5 6.1  Total Bilirubin 0.3 - 1.2 mg/dL 0.9 0.5 0.7  Alkaline Phos 38 - 126 U/L 64 75 55  AST 15 - 41 U/L _0 ALT 0 - 44 U/L _1 RADIOGRAPHIC STUDIES: I have personally reviewed the radiological images as listed and agreed with the findings in the report. No results found.   ASSESSMENT & PLAN:  Ashley Peters is a 86 y.o. female with    1. Cancer of the right colon, invasive adenocarcinoma, G1,  pT4bN0Mx stage IIA, MSI-stable -Diagnosed in 09/2016. Genetic testing was negative. Treated with right hemicolectomy andadjuvantXeloda. Currently on surveillance.  -She had a colonoscopy on 10/16/2019with a fewpolyps removed. Due to her advanced age, no further surveillance colonoscopy was recommended.  -I previously reviewed and discussed her CT CAP from 08/24/19 which showed no evidence of recurrent or metastatic disease. She does have dilation of bile duct which is stable and likely not relater to her cancer. She does have Multiple midline supraumbilical ventral hernias from surgery. I do not think hernia repair surgery is indicated at this time, will monitor.  -Clinically doing very well, asymptomatic, weight has been stable, exam was unremarkable, no clinical concern for recurrence -Lab reviewed, CBC and CMP are unremarkable, CEA still pending -We discussed that if she is over 3 years from her initial diagnosis, risk of recurrence has significantly decreased -Lab and follow-up in 8 months, then again in a year for her last visit. .  2. Anemiaand B12 deficiency -She currently receives monthlyB12 injectionswith PCP. -Anemia resolved.   3. Borderline DM and HTN -f/u with PCP and continue recommended medications -BP was elevated today. She will f/u with PCP  4. Osteoporosis -Currently on Fosamax, vitamin D and calcium. Continue. -Last DEXA was in 07/2017 and showed T score of -3.9 at radius forearm -f/u with PCP   Plan -She is clinically doing well, no concern for recurrence -Lab and F/u in 8 months    No problem-specific Assessment & Plan notes found for this encounter.   Orders Placed This Encounter  Procedures  . CEA (IN HOUSE-[facility])   All questions were answered. The patient knows to call the clinic with any problems, questions or concerns. No barriers to learning was detected. The total time spent in the appointment was 25 minutes.     Truitt Merle,  MD 02/24/2020   I, Joslyn Devon, am acting as scribe for Truitt Merle, MD.   I have reviewed the above documentation for accuracy and completeness, and I agree with the above.

## 2020-02-22 NOTE — Progress Notes (Signed)
BP (!) 144/77   Pulse 78   Ht _0  (1.448 m)   Wt 133 lb (60.3 kg)   SpO2 98%   BMI 28.78 kg/m    Subjective:   Patient ID: Ashley Peters, female    DOB: 1928-07-09, 86 y.o.   MRN: [id number]  HPI: Ashley Peters is a 86 y.o. female presenting on 02/22/2020 for Medical Management of Chronic Issues, Hypertension, and Hyperlipidemia (34mf/u)   HPI Hypertension Patient is currently on no medication currently, and their blood pressure today is 144/77. Patient denies any lightheadedness or dizziness. Patient denies headaches, blurred vision, chest pains, shortness of breath, or weakness. Denies any side effects from medication and is content with current medication.   Hyperlipidemia Patient is coming in for recheck of his hyperlipidemia. The patient is currently taking no medication currently diet controlled. They deny any issues with myalgias or history of liver damage from it. They deny any focal numbness or weakness or chest pain.   GERD Patient is currently on not on anything currently and has been doing well.  She denies any major symptoms or abdominal pain or belching or burping. She denies any blood in her stool or lightheadedness or dizziness.   Osteoporosis Patient is coming in for recheck for osteoporosis, currently taking Fosamax, due for bone density, will see if we can get that today.  Relevant past medical, surgical, family and social history reviewed and updated as indicated. Interim medical history since our last visit reviewed. Allergies and medications reviewed and updated.  Review of Systems  Constitutional: Negative for chills and fever.  Eyes: Negative for visual disturbance.  Respiratory: Negative for chest tightness and shortness of breath.   Cardiovascular: Negative for chest pain and leg swelling.  Musculoskeletal: Negative for back pain and gait problem.  Skin: Negative for rash.  Neurological: Negative for light-headedness and headaches.   Psychiatric/Behavioral: Negative for agitation and behavioral problems.  All other systems reviewed and are negative.   Per HPI unless specifically indicated above   Allergies as of 02/22/2020      Reactions   Benicar [olmesartan Medoxomil]    Elevated Potassium   Amoxicillin Rash   Has patient had a PCN reaction causing immediate rash, facial/tongue/throat swelling, SOB or lightheadedness with hypotension: Yes Has patient had a PCN reaction causing severe rash involving mucus membranes or skin necrosis: No Has patient had a PCN reaction that required hospitalization: No Has patient had a PCN reaction occurring within the last 10 years: No If all of the above answers are "NO", then may proceed with Cephalosporin use.      Medication List       Accurate as of February 22, 2020  8:13 AM. If you have any questions, ask your nurse or doctor.        acetaminophen 500 MG tablet Commonly known as: TYLENOL Take 2 tablets (1,000 mg total) by mouth every 6 (six) hours as needed for moderate pain.   alendronate 70 MG tablet Commonly known as: FOSAMAX TAKE 1 TABLET EVERY 7 DAYS WITH A FULL GLASS OF WATER ON AN EMPTY STOMACH   cholecalciferol 1000 units tablet Commonly known as: VITAMIN D Take 1,000 Units by mouth daily.   diphenoxylate-atropine 2.5-0.025 MG tablet Commonly known as: LOMOTIL Take 1 tablet 4 (four) times daily as needed by mouth for diarrhea or loose stools.   PRESERVISION AREDS 2 PO Take 1 capsule by mouth 2 (two) times daily.  Objective:   BP (!) 144/77   Pulse 78   Ht _0  (1.448 m)   Wt 133 lb (60.3 kg)   SpO2 98%   BMI 28.78 kg/m   Wt Readings from Last 3 Encounters:  02/22/20 133 lb (60.3 kg)  08/26/19 134 lb 3.2 oz (60.9 kg)  08/22/19 133 lb (60.3 kg)    Physical Exam Vitals and nursing note reviewed.  Constitutional:      General: She is not in acute distress.    Appearance: She is well-developed and well-nourished. She is not  diaphoretic.  Eyes:     Extraocular Movements: EOM normal.     Conjunctiva/sclera: Conjunctivae normal.  Cardiovascular:     Rate and Rhythm: Normal rate and regular rhythm.     Pulses: Intact distal pulses.     Heart sounds: Normal heart sounds. No murmur heard.   Pulmonary:     Effort: Pulmonary effort is normal. No respiratory distress.     Breath sounds: Normal breath sounds. No wheezing.  Musculoskeletal:        General: No tenderness or edema. Normal range of motion.  Skin:    General: Skin is warm and dry.     Findings: No rash.  Neurological:     Mental Status: She is alert and oriented to person, place, and time.     Coordination: Coordination normal.  Psychiatric:        Mood and Affect: Mood and affect normal.        Behavior: Behavior normal.       Assessment & Plan:   Problem List Items Addressed This Visit      Cardiovascular and Mediastinum   HTN (hypertension)   Relevant Orders   CMP14+EGFR     Digestive   GERD (gastroesophageal reflux disease)   Relevant Orders   CBC with Differential/Platelet     Musculoskeletal and Integument   Age-related osteoporosis without current pathological fracture   Relevant Medications   alendronate (FOSAMAX) 70 MG tablet   Other Relevant Orders   DG [facility] DEXA     Other   Hyperlipidemia - Primary   Relevant Orders   Lipid panel   B12 deficiency   Relevant Orders   Vitamin B12   Vitamin D deficiency   Relevant Orders   VITAMIN D 25 Hydroxy (Vit-D Deficiency, Fractures)      Recheck bone density and recheck labs Follow up plan: Return in about 6 months (around 08/22/2020), or if symptoms worsen or fail to improve, for Hypertension-.  Counseling provided for all of the vaccine components No orders of the defined types were placed in this encounter.   Caryl Pina, MD [facility] 02/22/2020, 8:13 AM

## 2020-02-23 LAB — CBC WITH DIFFERENTIAL/PLATELET
Basophils Absolute: 0.1 10*3/uL (ref 0.0–0.2)
Basos: 1 %
EOS (ABSOLUTE): 0.1 10*3/uL (ref 0.0–0.4)
Eos: 2 %
Hematocrit: 43.8 % (ref 34.0–46.6)
Hemoglobin: 14.6 g/dL (ref 11.1–15.9)
Immature Grans (Abs): 0 10*3/uL (ref 0.0–0.1)
Immature Granulocytes: 0 %
Lymphocytes Absolute: 0.6 10*3/uL — ABNORMAL LOW (ref 0.7–3.1)
Lymphs: 10 %
MCH: 29.8 pg (ref 26.6–33.0)
MCHC: 33.3 g/dL (ref 31.5–35.7)
MCV: 89 fL (ref 79–97)
Monocytes Absolute: 0.4 10*3/uL (ref 0.1–0.9)
Monocytes: 7 %
Neutrophils Absolute: 4.4 10*3/uL (ref 1.4–7.0)
Neutrophils: 80 %
Platelets: 209 10*3/uL (ref 150–450)
RBC: 4.9 x10E6/uL (ref 3.77–5.28)
RDW: 12.5 % (ref 11.7–15.4)
WBC: 5.5 10*3/uL (ref 3.4–10.8)

## 2020-02-23 LAB — CMP14+EGFR
ALT: 20 IU/L (ref 0–32)
AST: 21 IU/L (ref 0–40)
Albumin/Globulin Ratio: 2.6 — ABNORMAL HIGH (ref 1.2–2.2)
Albumin: 4.7 g/dL — ABNORMAL HIGH (ref 3.6–4.6)
Alkaline Phosphatase: 75 IU/L (ref 44–121)
BUN/Creatinine Ratio: 22 (ref 12–28)
BUN: 17 mg/dL (ref 8–27)
Bilirubin Total: 0.5 mg/dL (ref 0.0–1.2)
CO2: 22 mmol/L (ref 20–29)
Calcium: 9.1 mg/dL (ref 8.7–10.3)
Chloride: 107 mmol/L — ABNORMAL HIGH (ref 96–106)
Creatinine, Ser: 0.78 mg/dL (ref 0.57–1.00)
GFR calc Af Amer: 78 mL/min/{1.73_m2} (ref >59)
GFR calc non Af Amer: 68 mL/min/{1.73_m2} (ref >59)
Globulin, Total: 1.8 g/dL (ref 1.5–4.5)
Glucose: 99 mg/dL (ref 65–99)
Potassium: 4.1 mmol/L (ref 3.5–5.2)
Sodium: 143 mmol/L (ref 134–144)
Total Protein: 6.5 g/dL (ref 6.0–8.5)

## 2020-02-23 LAB — LIPID PANEL
Chol/HDL Ratio: 2.7 ratio (ref 0.0–4.4)
Cholesterol, Total: 181 mg/dL (ref 100–199)
HDL: 66 mg/dL (ref >39)
LDL Chol Calc (NIH): 94 mg/dL (ref 0–99)
Triglycerides: 122 mg/dL (ref 0–149)
VLDL Cholesterol Cal: 21 mg/dL (ref 5–40)

## 2020-02-23 LAB — VITAMIN D 25 HYDROXY (VIT D DEFICIENCY, FRACTURES): Vit D, 25-Hydroxy: 12.5 ng/mL — ABNORMAL LOW (ref 30.0–100.0)

## 2020-02-23 LAB — VITAMIN B12: Vitamin B-12: 388 pg/mL (ref 232–1245)

## 2020-02-24 ENCOUNTER — Inpatient Hospital Stay: Payer: Medicare Other | Attending: Hematology | Admitting: Hematology

## 2020-02-24 ENCOUNTER — Other Ambulatory Visit: Payer: Self-pay

## 2020-02-24 ENCOUNTER — Encounter: Payer: Self-pay | Admitting: Hematology

## 2020-02-24 ENCOUNTER — Inpatient Hospital Stay: Payer: Medicare Other

## 2020-02-24 VITALS — BP 179/73 | HR 84 | Temp 97.8°F | Resp 20 | Ht <= 58 in | Wt 133.7 lb

## 2020-02-24 DIAGNOSIS — I10 Essential (primary) hypertension: Secondary | ICD-10-CM | POA: Insufficient documentation

## 2020-02-24 DIAGNOSIS — N281 Cyst of kidney, acquired: Secondary | ICD-10-CM | POA: Insufficient documentation

## 2020-02-24 DIAGNOSIS — Z79899 Other long term (current) drug therapy: Secondary | ICD-10-CM | POA: Insufficient documentation

## 2020-02-24 DIAGNOSIS — C787 Secondary malignant neoplasm of liver and intrahepatic bile duct: Secondary | ICD-10-CM | POA: Insufficient documentation

## 2020-02-24 DIAGNOSIS — D500 Iron deficiency anemia secondary to blood loss (chronic): Secondary | ICD-10-CM

## 2020-02-24 DIAGNOSIS — I2510 Atherosclerotic heart disease of native coronary artery without angina pectoris: Secondary | ICD-10-CM | POA: Insufficient documentation

## 2020-02-24 DIAGNOSIS — H3530 Unspecified macular degeneration: Secondary | ICD-10-CM | POA: Insufficient documentation

## 2020-02-24 DIAGNOSIS — M810 Age-related osteoporosis without current pathological fracture: Secondary | ICD-10-CM | POA: Diagnosis not present

## 2020-02-24 DIAGNOSIS — I700 Atherosclerosis of aorta: Secondary | ICD-10-CM | POA: Insufficient documentation

## 2020-02-24 DIAGNOSIS — E785 Hyperlipidemia, unspecified: Secondary | ICD-10-CM | POA: Diagnosis not present

## 2020-02-24 DIAGNOSIS — Z9049 Acquired absence of other specified parts of digestive tract: Secondary | ICD-10-CM | POA: Diagnosis not present

## 2020-02-24 DIAGNOSIS — Z880 Allergy status to penicillin: Secondary | ICD-10-CM | POA: Insufficient documentation

## 2020-02-24 DIAGNOSIS — E538 Deficiency of other specified B group vitamins: Secondary | ICD-10-CM | POA: Diagnosis not present

## 2020-02-24 DIAGNOSIS — D649 Anemia, unspecified: Secondary | ICD-10-CM | POA: Diagnosis not present

## 2020-02-24 DIAGNOSIS — K219 Gastro-esophageal reflux disease without esophagitis: Secondary | ICD-10-CM | POA: Diagnosis not present

## 2020-02-24 DIAGNOSIS — C182 Malignant neoplasm of ascending colon: Secondary | ICD-10-CM | POA: Diagnosis not present

## 2020-02-24 DIAGNOSIS — K439 Ventral hernia without obstruction or gangrene: Secondary | ICD-10-CM | POA: Insufficient documentation

## 2020-02-24 DIAGNOSIS — Z8049 Family history of malignant neoplasm of other genital organs: Secondary | ICD-10-CM | POA: Diagnosis not present

## 2020-02-24 DIAGNOSIS — K5730 Diverticulosis of large intestine without perforation or abscess without bleeding: Secondary | ICD-10-CM | POA: Diagnosis not present

## 2020-02-24 DIAGNOSIS — Z800 Family history of malignant neoplasm of digestive organs: Secondary | ICD-10-CM | POA: Diagnosis not present

## 2020-02-24 DIAGNOSIS — Z803 Family history of malignant neoplasm of breast: Secondary | ICD-10-CM | POA: Insufficient documentation

## 2020-02-24 LAB — RETICULOCYTES
Immature Retic Fract: 3.8 % (ref 2.3–15.9)
RBC.: 4.65 MIL/uL (ref 3.87–5.11)
Retic Count, Absolute: 54.9 10*3/uL (ref 19.0–186.0)
Retic Ct Pct: 1.2 % (ref 0.4–3.1)

## 2020-02-24 LAB — CBC WITH DIFFERENTIAL (CANCER CENTER ONLY)
Abs Immature Granulocytes: 0.04 10*3/uL (ref 0.00–0.07)
Basophils Absolute: 0.1 10*3/uL (ref 0.0–0.1)
Basophils Relative: 1 %
Eosinophils Absolute: 0.1 10*3/uL (ref 0.0–0.5)
Eosinophils Relative: 1 %
HCT: 42.5 % (ref 36.0–46.0)
Hemoglobin: 13.9 g/dL (ref 12.0–15.0)
Immature Granulocytes: 1 %
Lymphocytes Relative: 7 %
Lymphs Abs: 0.5 10*3/uL — ABNORMAL LOW (ref 0.7–4.0)
MCH: 29.7 pg (ref 26.0–34.0)
MCHC: 32.7 g/dL (ref 30.0–36.0)
MCV: 90.8 fL (ref 80.0–100.0)
Monocytes Absolute: 0.6 10*3/uL (ref 0.1–1.0)
Monocytes Relative: 8 %
Neutro Abs: 6.2 10*3/uL (ref 1.7–7.7)
Neutrophils Relative %: 82 %
Platelet Count: 184 10*3/uL (ref 150–400)
RBC: 4.68 MIL/uL (ref 3.87–5.11)
RDW: 12.8 % (ref 11.5–15.5)
WBC Count: 7.4 10*3/uL (ref 4.0–10.5)
nRBC: 0 % (ref 0.0–0.2)

## 2020-02-24 LAB — FERRITIN: Ferritin: 122 ng/mL (ref 11–307)

## 2020-02-24 LAB — COMPREHENSIVE METABOLIC PANEL
ALT: 21 U/L (ref 0–44)
AST: 21 U/L (ref 15–41)
Albumin: 3.8 g/dL (ref 3.5–5.0)
Alkaline Phosphatase: 64 U/L (ref 38–126)
Anion gap: 7 (ref 5–15)
BUN: 15 mg/dL (ref 8–23)
CO2: 25 mmol/L (ref 22–32)
Calcium: 9.2 mg/dL (ref 8.9–10.3)
Chloride: 109 mmol/L (ref 98–111)
Creatinine, Ser: 0.76 mg/dL (ref 0.44–1.00)
GFR, Estimated: >60 mL/min (ref >60)
Glucose, Bld: 106 mg/dL — ABNORMAL HIGH (ref 70–99)
Potassium: 3.9 mmol/L (ref 3.5–5.1)
Sodium: 141 mmol/L (ref 135–145)
Total Bilirubin: 0.9 mg/dL (ref 0.3–1.2)
Total Protein: 6.9 g/dL (ref 6.5–8.1)

## 2020-02-24 LAB — IRON AND TIBC
Iron: 35 ug/dL — ABNORMAL LOW (ref 41–142)
Saturation Ratios: 12 % — ABNORMAL LOW (ref 21–57)
TIBC: 281 ug/dL (ref 236–444)
UIBC: 246 ug/dL (ref 120–384)

## 2020-02-24 LAB — VITAMIN B12: Vitamin B-12: 258 pg/mL (ref 180–914)

## 2020-02-24 LAB — CEA (IN HOUSE-CHCC): CEA (CHCC-In House): 3.26 ng/mL (ref 0.00–5.00)

## 2020-02-27 ENCOUNTER — Telehealth: Payer: Self-pay

## 2020-02-27 ENCOUNTER — Telehealth: Payer: Self-pay | Admitting: Hematology

## 2020-02-27 NOTE — Telephone Encounter (Signed)
I spoke with Ashley Peters and relayed Dr Ernestina Penna comments and recommendations.  Ashley Peters states she missed her B12 shot last month.  She verbalized understanding.

## 2020-02-27 NOTE — Telephone Encounter (Signed)
Scheduled appts per 12/17 los. Pt confirmed appt date and time.

## 2020-02-27 NOTE — Telephone Encounter (Signed)
Pt returned missed call regarding lab results. Reviewed results with pt per Dr Neldon Mc note. Pt voiced understanding.

## 2020-02-27 NOTE — Telephone Encounter (Signed)
-----   Message from Truitt Merle, MD sent at 02/25/2020  9:40 AM EST ----- Please let pt know her iron was normal, B12 on low end normal, not sure if she is due for B12 injection at PCP office, she can also add oral B12 1047mcg if she is willing.  Truitt Merle

## 2020-02-28 ENCOUNTER — Ambulatory Visit (INDEPENDENT_AMBULATORY_CARE_PROVIDER_SITE_OTHER): Payer: Medicare Other | Admitting: *Deleted

## 2020-02-28 ENCOUNTER — Other Ambulatory Visit: Payer: Self-pay

## 2020-02-28 DIAGNOSIS — E538 Deficiency of other specified B group vitamins: Secondary | ICD-10-CM

## 2020-02-28 NOTE — Progress Notes (Signed)
B12 Injection given and tolerated well °

## 2020-03-30 ENCOUNTER — Ambulatory Visit: Payer: Medicare Other

## 2020-04-04 ENCOUNTER — Other Ambulatory Visit: Payer: Self-pay

## 2020-04-04 ENCOUNTER — Ambulatory Visit (INDEPENDENT_AMBULATORY_CARE_PROVIDER_SITE_OTHER): Payer: Medicare Other | Admitting: *Deleted

## 2020-04-04 DIAGNOSIS — E538 Deficiency of other specified B group vitamins: Secondary | ICD-10-CM

## 2020-04-04 NOTE — Progress Notes (Signed)
Pt given B12 injection IM right deltoid and tolerated well. 

## 2020-04-17 ENCOUNTER — Encounter (INDEPENDENT_AMBULATORY_CARE_PROVIDER_SITE_OTHER): Payer: Medicare Other | Admitting: Ophthalmology

## 2020-04-17 ENCOUNTER — Encounter (INDEPENDENT_AMBULATORY_CARE_PROVIDER_SITE_OTHER): Payer: Self-pay | Admitting: Ophthalmology

## 2020-04-17 ENCOUNTER — Ambulatory Visit (INDEPENDENT_AMBULATORY_CARE_PROVIDER_SITE_OTHER): Payer: Medicare Other | Admitting: Ophthalmology

## 2020-04-17 ENCOUNTER — Other Ambulatory Visit: Payer: Self-pay

## 2020-04-17 DIAGNOSIS — H43822 Vitreomacular adhesion, left eye: Secondary | ICD-10-CM

## 2020-04-17 DIAGNOSIS — H353221 Exudative age-related macular degeneration, left eye, with active choroidal neovascularization: Secondary | ICD-10-CM | POA: Diagnosis not present

## 2020-04-17 DIAGNOSIS — H353114 Nonexudative age-related macular degeneration, right eye, advanced atrophic with subfoveal involvement: Secondary | ICD-10-CM

## 2020-04-17 DIAGNOSIS — H353123 Nonexudative age-related macular degeneration, left eye, advanced atrophic without subfoveal involvement: Secondary | ICD-10-CM

## 2020-04-17 DIAGNOSIS — H353212 Exudative age-related macular degeneration, right eye, with inactive choroidal neovascularization: Secondary | ICD-10-CM | POA: Diagnosis not present

## 2020-04-17 DIAGNOSIS — H353124 Nonexudative age-related macular degeneration, left eye, advanced atrophic with subfoveal involvement: Secondary | ICD-10-CM | POA: Insufficient documentation

## 2020-04-17 MED ORDER — BEVACIZUMAB 2.5 MG/0.1ML IZ SOSY
2.5000 mg | PREFILLED_SYRINGE | INTRAVITREAL | Status: AC | PRN
  Administered 2020-04-17: 2.5 mg via INTRAVITREAL

## 2020-04-17 NOTE — Assessment & Plan Note (Signed)
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

## 2020-04-17 NOTE — Assessment & Plan Note (Signed)
Stable over time, RPE dropout in the foveal avascular zone

## 2020-04-17 NOTE — Assessment & Plan Note (Signed)
Much of the macular region under geographic atrophy change, small island of perifoveal FAZ remains

## 2020-04-17 NOTE — Assessment & Plan Note (Signed)
Minor with no impact on acuity

## 2020-04-17 NOTE — Progress Notes (Signed)
04/17/2020     CHIEF COMPLAINT Patient presents for Retina Follow Up (10 Week Wet AMD f\u. Possible Avastin OS. OCT/Pt states vision is stable. Denies new floaters and FOL)   HISTORY OF PRESENT ILLNESS: Ashley Peters is a 87 y.o. female who presents to the clinic today for:   HPI    Retina Follow Up    Patient presents with  Wet AMD.  In left eye.  Severity is moderate.  Duration of 10 weeks.  Since onset it is stable. Additional comments: 10 Week Wet AMD f\u. Possible Avastin OS. OCT Pt states vision is stable. Denies new floaters and FOL       Last edited by Tilda Franco on 04/17/2020  8:03 AM. (History)      Referring physician: Dettinger, [name], MD [place],  [place] [address]  HISTORICAL INFORMATION:   Selected notes from the MEDICAL RECORD NUMBER    Lab Results  Component Value Date   HGBA1C 5.7 02/04/2016     CURRENT MEDICATIONS: No current outpatient medications on file. (Ophthalmic Drugs)   No current facility-administered medications for this visit. (Ophthalmic Drugs)   Current Outpatient Medications (Other)  Medication Sig  . acetaminophen (TYLENOL) 500 MG tablet Take 2 tablets (1,000 mg total) by mouth every 6 (six) hours as needed for moderate pain.  Marland Kitchen alendronate (FOSAMAX) 70 MG tablet Take with a full glass of water on an empty stomach.  . cholecalciferol (VITAMIN D) 1000 units tablet Take 1,000 Units by mouth daily.  . Multiple Vitamins-Minerals (PRESERVISION AREDS 2 PO) Take 1 capsule by mouth 2 (two) times daily.   Current Facility-Administered Medications (Other)  Medication Route  . cyanocobalamin ((VITAMIN B-12)) injection 1,000 mcg Intramuscular      REVIEW OF SYSTEMS:    ALLERGIES Allergies  Allergen Reactions  . Benicar [Olmesartan Medoxomil]     Elevated Potassium  . Amoxicillin Rash    Has patient had a PCN reaction causing immediate rash, facial/tongue/throat swelling, SOB or lightheadedness with hypotension:  Yes Has patient had a PCN reaction causing severe rash involving mucus membranes or skin necrosis: No Has patient had a PCN reaction that required hospitalization: No Has patient had a PCN reaction occurring within the last 10 years: No If all of the above answers are "NO", then may proceed with Cephalosporin use.    PAST MEDICAL HISTORY Past Medical History:  Diagnosis Date  . Anemia   . Blood transfusion without reported diagnosis   . Cancer ([place])   . Colon cancer ([place]) dx'd 10/2016  . Diverticulosis   . Family history of breast cancer   . Family history of colon cancer   . Family history of uterine cancer   . GERD (gastroesophageal reflux disease)   . History of kidney stones   . Hyperlipidemia   . Hypertension   . Macular degeneration   . Osteopenia    Past Surgical History:  Procedure Laterality Date  . ABDOMINAL HYSTERECTOMY    . ablation tr Great saphenous vein  10/17/2009  . BREAST EXCISIONAL BIOPSY Bilateral 1971  . brest biopsy  1971  . CHOLECYSTECTOMY    . EPIGASTRIC HERNIA REPAIR N/A 10/14/2016   Procedure: REPAIR OF EPIGASTRIC VENTRAL HERNIA;  Surgeon: Donnie Mesa, MD;  Location: [facility];  Service: General;  Laterality: N/A;  . lt. distal ureteral stone extraction  09/14/1991  . PARTIAL COLECTOMY Right 10/14/2016   Procedure: OPEN RIGHT HEMICOLECTOMY;  Surgeon: Donnie Mesa, MD;  [facility];  Service: General;  Laterality: Right;    FAMILY HISTORY Family History  Problem Relation Age of Onset  . Stroke Father 18  . Breast cancer Sister        dx 54's, died at 56  . Seizures Son   . Diabetes Brother   . Kidney disease Brother   . Heart disease Brother   . Epilepsy Son   . Colon cancer Other 19  . Uterine cancer Other 73  . Hodgkin's lymphoma Other   . Breast cancer Other     SOCIAL HISTORY Social History   Tobacco Use  . Smoking status: Never Smoker  . Smokeless tobacco: Never Used  Vaping Use  . Vaping Use: Never used  Substance Use  Topics  . Alcohol use: No  . Drug use: No         OPHTHALMIC EXAM: Base Eye Exam    Visual Acuity (Snellen - Linear)      Right Left   Dist cc 20/200 -1 20/200 -1   Dist ph cc NI NI   Correction: Glasses       Tonometry (Tonopen, 8:07 AM)      Right Left   Pressure 17 14       Pupils      Pupils Dark Light Shape React APD   Right PERRL 5 4 Round Brisk None   Left PERRL 5 4 Round Brisk None       Visual Fields (Counting fingers)      Left Right    Full Full       Neuro/Psych    Oriented x3: Yes   Mood/Affect: Normal       Dilation    Both eyes: 1.0% Mydriacyl, 2.5% Phenylephrine @ 8:07 AM        Slit Lamp and Fundus Exam    External Exam      Right Left   External Normal Normal       Slit Lamp Exam      Right Left   Lids/Lashes Normal Normal   Conjunctiva/Sclera White and quiet White and quiet   Cornea Clear Clear   Anterior Chamber Deep and quiet Deep and quiet   Iris Round and reactive Round and reactive   Lens Posterior chamber intraocular lens Posterior chamber intraocular lens   Anterior Vitreous Normal Normal       Fundus Exam      Right Left   Posterior Vitreous Posterior vitreous detachment Normal   Disc Normal Normal   C/D Ratio 0.45 0.5   Macula Retinal pigment epithelial mottling, Hard drusen, Geographic atrophy into the FAZ, Advanced age related macular degeneration, Geographic atrophy, no macular thickening, no hemorrhage, no exudates, Disciform scar Retinal pigment epithelial mottling, Hard drusen, Pigmented atrophy, Geographic atrophy, small island of FAZ RPE   Vessels Normal Normal   Periphery Normal Normal          IMAGING AND PROCEDURES  Imaging and Procedures for 04/17/20  OCT, Retina - OU - Both Eyes       Right Eye Quality was good. Scan locations included subfoveal. Central Foveal Thickness: 297. Progression has been stable. Findings include abnormal foveal contour, no SRF, no IRF, outer retinal atrophy, central  retinal atrophy, subretinal hyper-reflective material.   Left Eye Quality was good. Scan locations included subfoveal. Central Foveal Thickness: 242. Progression has been stable. Findings include abnormal foveal contour, outer retinal atrophy, central retinal atrophy, subretinal scarring, subretinal hyper-reflective material.   Notes Extensive  outer retinal atrophy, with loss of photoreceptor layer       Intravitreal Injection, Pharmacologic Agent - OS - Left Eye       Time Out 04/17/2020. 8:50 AM. Confirmed correct patient, procedure, site, and patient consented.   Anesthesia Topical anesthesia was used. Anesthetic medications included [place] 3.5%.   Procedure Preparation included 10% betadine to eyelids, 5% betadine to ocular surface, Tobramycin 0.3%. A 30 gauge needle was used.   Injection:  2.5 mg Bevacizumab (AVASTIN) 2.5mg /0.31mL SOSY   NDC: 62831-517-61, Lot: [id number]   Route: Intravitreal, Site: Left Eye  Post-op Post injection exam found visual acuity of at least counting fingers. The patient tolerated the procedure well. There were no complications. The patient received written and verbal post procedure care education. Post injection medications were not given.                 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 Exudative age-related macular  degeneration of left eye with active choroidal neovascularization (HCC)  H35.3221 OCT, Retina - OU - Both Eyes    Intravitreal Injection, Pharmacologic Agent - OS - Left Eye    bevacizumab (AVASTIN) SOSY 2.5 mg  2. Vitreomacular adhesion of left eye  H43.822   3. Exudative age-related macular degeneration of right eye with inactive choroidal neovascularization ([place])  H35.3212   4. Advanced nonexudative age-related macular degeneration of right eye with subfoveal involvement  H35.3114   5. Advanced nonexudative age-related macular degeneration of left eye without subfoveal involvement  H35.3123     1.  2.  3.  Ophthalmic Meds Ordered this visit:  Meds ordered this encounter  Medications  . bevacizumab (AVASTIN) SOSY 2.5 mg       Return in about 3 months (around 07/15/2020) for DILATE OU, AVASTIN OCT, OS, COLOR FP.  There are no Patient Instructions on file for this visit.   Explained the diagnoses, plan, and follow up with the patient and they expressed understanding.  Patient expressed understanding of the importance of proper follow up care.   Clent Demark Asaph Serena M.D. Diseases & Surgery of the Retina and Vitreous Retina & Diabetic [place] 04/17/20     Abbreviations: M myopia (nearsighted); A astigmatism; H hyperopia (farsighted); P presbyopia; Mrx spectacle prescription;  CTL contact lenses; OD right eye; OS left eye; OU both eyes  XT exotropia; ET esotropia; PEK punctate epithelial keratitis; PEE punctate epithelial erosions; DES dry eye syndrome; MGD meibomian gland dysfunction; ATs artificial tears; PFAT's preservative free artificial tears; [place] nuclear sclerotic cataract; PSC posterior subcapsular cataract; ERM epi-retinal membrane; PVD posterior vitreous detachment; RD retinal detachment; DM diabetes mellitus; DR diabetic retinopathy; NPDR non-proliferative diabetic retinopathy; PDR proliferative diabetic retinopathy; CSME clinically significant macular edema; DME diabetic  macular edema; dbh dot blot hemorrhages; CWS cotton wool spot; POAG primary open angle glaucoma; C/D cup-to-disc ratio; HVF humphrey visual field; GVF goldmann visual field; OCT optical coherence tomography; IOP intraocular pressure; BRVO Branch retinal vein occlusion;  CRVO central retinal vein occlusion; CRAO central retinal artery occlusion; BRAO branch retinal artery occlusion; RT retinal tear; SB scleral buckle; PPV pars plana vitrectomy; VH Vitreous hemorrhage; PRP panretinal laser photocoagulation; IVK intravitreal kenalog; VMT vitreomacular traction; MH Macular hole;  NVD neovascularization of the disc; NVE neovascularization elsewhere; AREDS age related eye disease study; ARMD age related macular degeneration; POAG primary open angle glaucoma; EBMD epithelial/anterior basement membrane dystrophy; ACIOL anterior chamber intraocular lens; IOL intraocular lens; PCIOL posterior chamber intraocular lens; Phaco/IOL phacoemulsification with intraocular lens placement; PRK photorefractive keratectomy; LASIK laser assisted in situ keratomileusis; HTN hypertension; DM diabetes mellitus; COPD chronic obstructive pulmonary disease 

## 2020-05-07 ENCOUNTER — Other Ambulatory Visit: Payer: Self-pay

## 2020-05-07 ENCOUNTER — Ambulatory Visit (INDEPENDENT_AMBULATORY_CARE_PROVIDER_SITE_OTHER): Payer: Medicare Other | Admitting: *Deleted

## 2020-05-07 DIAGNOSIS — E538 Deficiency of other specified B group vitamins: Secondary | ICD-10-CM | POA: Diagnosis not present

## 2020-06-05 ENCOUNTER — Ambulatory Visit (INDEPENDENT_AMBULATORY_CARE_PROVIDER_SITE_OTHER): Payer: Medicare Other | Admitting: *Deleted

## 2020-06-05 ENCOUNTER — Other Ambulatory Visit: Payer: Self-pay

## 2020-06-05 DIAGNOSIS — E538 Deficiency of other specified B group vitamins: Secondary | ICD-10-CM | POA: Diagnosis not present

## 2020-06-05 NOTE — Progress Notes (Signed)
Pt given B12 injection IM right deltoid and tolerated well. 

## 2020-07-03 DIAGNOSIS — H353231 Exudative age-related macular degeneration, bilateral, with active choroidal neovascularization: Secondary | ICD-10-CM | POA: Diagnosis not present

## 2020-07-03 DIAGNOSIS — H04123 Dry eye syndrome of bilateral lacrimal glands: Secondary | ICD-10-CM | POA: Diagnosis not present

## 2020-07-03 DIAGNOSIS — Z961 Presence of intraocular lens: Secondary | ICD-10-CM | POA: Diagnosis not present

## 2020-07-09 ENCOUNTER — Ambulatory Visit (INDEPENDENT_AMBULATORY_CARE_PROVIDER_SITE_OTHER): Payer: Medicare Other | Admitting: Family Medicine

## 2020-07-09 ENCOUNTER — Other Ambulatory Visit: Payer: Self-pay

## 2020-07-09 DIAGNOSIS — E538 Deficiency of other specified B group vitamins: Secondary | ICD-10-CM

## 2020-07-17 ENCOUNTER — Other Ambulatory Visit: Payer: Self-pay

## 2020-07-17 ENCOUNTER — Encounter (INDEPENDENT_AMBULATORY_CARE_PROVIDER_SITE_OTHER): Payer: Self-pay | Admitting: Ophthalmology

## 2020-07-17 ENCOUNTER — Ambulatory Visit (INDEPENDENT_AMBULATORY_CARE_PROVIDER_SITE_OTHER): Payer: Medicare Other | Admitting: Ophthalmology

## 2020-07-17 DIAGNOSIS — H353124 Nonexudative age-related macular degeneration, left eye, advanced atrophic with subfoveal involvement: Secondary | ICD-10-CM

## 2020-07-17 DIAGNOSIS — H353114 Nonexudative age-related macular degeneration, right eye, advanced atrophic with subfoveal involvement: Secondary | ICD-10-CM | POA: Diagnosis not present

## 2020-07-17 DIAGNOSIS — H353222 Exudative age-related macular degeneration, left eye, with inactive choroidal neovascularization: Secondary | ICD-10-CM

## 2020-07-17 DIAGNOSIS — H353221 Exudative age-related macular degeneration, left eye, with active choroidal neovascularization: Secondary | ICD-10-CM

## 2020-07-17 NOTE — Assessment & Plan Note (Signed)
Subfoveal atrophy accounts for acuity OD and OS 

## 2020-07-17 NOTE — Progress Notes (Signed)
07/17/2020     CHIEF COMPLAINT Patient presents for Retina Follow Up (3 month fu OU/ OCT/ FP/ Avastin OS//Pt states VA OU stable since last visit. Pt denies FOL, floaters, or ocular pain OU. /)   HISTORY OF PRESENT ILLNESS: Ashley Peters is a 87 y.o. female who presents to the clinic today for:   HPI    Retina Follow Up    Diagnosis: Wet AMD   Laterality: left eye   Onset: 3 months ago   Duration: 3 months   Comments: 3 month fu OU/ OCT/ FP/ Avastin OS  Pt states VA OU stable since last visit. Pt denies FOL, floaters, or ocular pain OU.         Last edited by Kendra Opitz, [place] on 07/17/2020  8:17 AM. (History)      Referring physician: Dettinger, [name], MD [place],  [place] [id number]  HISTORICAL INFORMATION:   Selected notes from the MEDICAL RECORD NUMBER    Lab Results  Component Value Date   HGBA1C 5.7 02/04/2016     CURRENT MEDICATIONS: No current outpatient medications on file. (Ophthalmic Drugs)   No current facility-administered medications for this visit. (Ophthalmic Drugs)   Current Outpatient Medications (Other)  Medication Sig  . acetaminophen (TYLENOL) 500 MG tablet Take 2 tablets (1,000 mg total) by mouth every 6 (six) hours as needed for moderate pain.  Marland Kitchen alendronate (FOSAMAX) 70 MG tablet Take with a full glass of water on an empty stomach.  . cholecalciferol (VITAMIN D) 1000 units tablet Take 1,000 Units by mouth daily.  . Multiple Vitamins-Minerals (PRESERVISION AREDS 2 PO) Take 1 capsule by mouth 2 (two) times daily.   Current Facility-Administered Medications (Other)  Medication Route  . cyanocobalamin ((VITAMIN B-12)) injection 1,000 mcg Intramuscular      REVIEW OF SYSTEMS:    ALLERGIES Allergies  Allergen Reactions  . Benicar [Olmesartan Medoxomil]     Elevated Potassium  . Amoxicillin Rash    Has patient had a PCN reaction causing immediate rash, facial/tongue/throat swelling, SOB or lightheadedness with  hypotension: Yes Has patient had a PCN reaction causing severe rash involving mucus membranes or skin necrosis: No Has patient had a PCN reaction that required hospitalization: No Has patient had a PCN reaction occurring within the last 10 years: No If all of the above answers are "NO", then may proceed with Cephalosporin use.    PAST MEDICAL HISTORY Past Medical History:  Diagnosis Date  . Anemia   . Blood transfusion without reported diagnosis   . Cancer ([place])   . Colon cancer ([place]) dx'd 10/2016  . Diverticulosis   . Family history of breast cancer   . Family history of colon cancer   . Family history of uterine cancer   . GERD (gastroesophageal reflux disease)   . History of kidney stones   . Hyperlipidemia   . Hypertension   . Macular degeneration   . Osteopenia    Past Surgical History:  Procedure Laterality Date  . ABDOMINAL HYSTERECTOMY    . ablation tr Great saphenous vein  10/17/2009  . BREAST EXCISIONAL BIOPSY Bilateral 1971  . brest biopsy  1971  . CHOLECYSTECTOMY    . EPIGASTRIC HERNIA REPAIR N/A 10/14/2016   Procedure: REPAIR OF EPIGASTRIC VENTRAL HERNIA;  Surgeon: Donnie Mesa, MD;  Location: [facility];  Service: General;  Laterality: N/A;  . lt. distal ureteral stone extraction  09/14/1991  . PARTIAL COLECTOMY Right 10/14/2016   Procedure:  OPEN RIGHT HEMICOLECTOMY;  Surgeon: Donnie Mesa, MD;  Location: [facility];  Service: General;  Laterality: Right;    FAMILY HISTORY Family History  Problem Relation Age of Onset  . Stroke Father 45  . Breast cancer Sister        dx 38's, died at 46  . Seizures Son   . Diabetes Brother   . Kidney disease Brother   . Heart disease Brother   . Epilepsy Son   . Colon cancer Other 24  . Uterine cancer Other 42  . Hodgkin's lymphoma Other   . Breast cancer Other     SOCIAL HISTORY Social History   Tobacco Use  . Smoking status: Never Smoker  . Smokeless tobacco: Never Used  Vaping Use  . Vaping Use: Never used   Substance Use Topics  . Alcohol use: No  . Drug use: No         OPHTHALMIC EXAM:  Base Eye Exam    Visual Acuity ([employer])      Right Left   Dist cc 20/400 20/400 ecc   Correction: Glasses       Tonometry (Tonopen, 8:24 AM)      Right Left   Pressure 17 14       Pupils      Pupils Dark Light Shape React APD   Right PERRL 5 4 Round Brisk None   Left PERRL 5 4 Round Brisk None       Visual Fields (Counting fingers)      Left Right    Full Full       Neuro/Psych    Oriented x3: Yes   Mood/Affect: Normal       Dilation    Both eyes: 1.0% Mydriacyl, 2.5% Phenylephrine @ 8:24 AM        Slit Lamp and Fundus Exam    External Exam      Right Left   External Normal Normal       Slit Lamp Exam      Right Left   Lids/Lashes Normal Normal   Conjunctiva/Sclera White and quiet White and quiet   Cornea Clear Clear   Anterior Chamber Deep and quiet Deep and quiet   Iris Round and reactive Round and reactive   Lens Posterior chamber intraocular lens Posterior chamber intraocular lens   Anterior Vitreous Normal Normal       Fundus Exam      Right Left   Posterior Vitreous Posterior vitreous detachment Normal   Disc Normal Normal   C/D Ratio 0.45 0.5   Macula Retinal pigment epithelial mottling, Hard drusen, Geographic atrophy into the FAZ, Advanced age related macular degeneration, Geographic atrophy encompasses the FAZ approximately 5 DA size, no macular thickening, no hemorrhage, no exudates, Disciform scar Retinal pigment epithelial mottling, Hard drusen, Pigmented atrophy, Geographic atrophy, now with subfoveal atrophy approximately 5.5 DA size   Vessels Normal Normal   Periphery Normal Normal          IMAGING AND PROCEDURES  Imaging and Procedures for 07/17/20  OCT, Retina - OU - Both Eyes       Right Eye Quality was good. Scan locations included subfoveal. Central Foveal Thickness: 259. Progression has been stable. Findings  include abnormal foveal contour, no SRF, no IRF, outer retinal atrophy, central retinal atrophy, subretinal hyper-reflective material.   Left Eye Quality was good. Scan locations included subfoveal. Central Foveal Thickness: 241. Progression has been stable. Findings include abnormal foveal contour, outer retinal  atrophy, central retinal atrophy, subretinal scarring, subretinal hyper-reflective material.   Notes Extensive outer retinal atrophy, with loss of photoreceptor layer centrally in each eye with no signs of intraretinal fluid or perimacular extension of CNVM in either eye.       Color Fundus Photography Optos - OU - Both Eyes       Right Eye Progression has been stable. Disc findings include normal observations. Macula : geographic atrophy. Vessels : normal observations. Periphery : normal observations.   Left Eye Progression has been stable. Macula : geographic atrophy. Vessels : normal observations. Periphery : normal observations.   Notes OD with extensive geographic atrophy as well as no signs of CNVM.  OS also with geographic atrophy approximately 5-6 disc areas in extent .  No signs of CNVM OS                ASSESSMENT/PLAN:  Advanced nonexudative age-related macular degeneration of left eye with subfoveal involvement Subfoveal atrophy accounts for acuity OD and OS  Advanced nonexudative age-related macular degeneration of right eye with subfoveal involvement Subfoveal atrophy accounts for acuity OD and OS  Exudative age-related macular degeneration of left eye with inactive choroidal neovascularization ([place]) No signs of active CNVM by examination or OCT      ICD-10-CM   1. Exudative age-related macular degeneration of left eye with active choroidal neovascularization (HCC)  H35.3221 OCT, Retina - OU - Both Eyes    Color Fundus Photography Optos - OU - Both Eyes  2. Advanced nonexudative age-related macular degeneration of left eye with subfoveal  involvement  H35.3124   3. 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 Exudative age-related macular degeneration of left eye with inactive choroidal neovascularization ([place])  H35.3222     1.  OU, history of active CNVM now quiescent post treatment and observation now left eye for 3 months.  We will follow-up in monitor in 3 months again with OCT and color fundus photography next OU  2.  No need for interventional therapy today  3.  Ophthalmic Meds Ordered this visit:  No orders of the defined types were placed in this encounter.      Return in about 3 months (around 10/17/2020) for DILATE OU, OCT, COLOR FP.  There are no Patient Instructions on file for this visit.   Explained the diagnoses, plan, and follow up with the patient and they expressed understanding.  Patient expressed understanding of the importance of proper follow up care.   Clent Demark Alakai Macbride M.D. Diseases & Surgery of the Retina and Vitreous Retina & Diabetic [place] 07/17/20     Abbreviations: M myopia (nearsighted); A astigmatism; H hyperopia (farsighted); P presbyopia; Mrx spectacle prescription;  CTL contact lenses; OD right eye; OS left eye; OU both eyes  XT exotropia; ET esotropia; PEK punctate epithelial keratitis; PEE punctate epithelial erosions; DES dry eye syndrome; MGD meibomian gland dysfunction; ATs artificial tears; PFAT's preservative free artificial tears; [place] nuclear sclerotic cataract; PSC posterior subcapsular cataract; ERM epi-retinal membrane; PVD posterior vitreous detachment; RD retinal detachment; DM diabetes mellitus; DR diabetic retinopathy; NPDR non-proliferative diabetic retinopathy; PDR proliferative diabetic retinopathy; CSME clinically significant macular edema; DME diabetic macular edema; dbh dot blot hemorrhages; CWS cotton wool spot; POAG primary open angle glaucoma; C/D cup-to-disc ratio; HVF humphrey visual field; GVF  goldmann visual field; OCT optical coherence tomography; IOP intraocular pressure; BRVO Branch retinal vein occlusion; CRVO central retinal vein occlusion; CRAO central retinal artery  occlusion; BRAO branch retinal artery occlusion; RT retinal tear; SB scleral buckle; PPV pars plana vitrectomy; VH Vitreous hemorrhage; PRP panretinal laser photocoagulation; IVK intravitreal kenalog; VMT vitreomacular traction; MH Macular hole;  NVD neovascularization of the disc; NVE neovascularization elsewhere; AREDS age related eye disease study; ARMD age related macular degeneration; POAG primary open angle glaucoma; EBMD epithelial/anterior basement membrane dystrophy; ACIOL anterior chamber intraocular lens; IOL intraocular lens; PCIOL posterior chamber intraocular lens; Phaco/IOL phacoemulsification with intraocular lens placement; [place] photorefractive keratectomy; LASIK laser assisted in situ keratomileusis; HTN hypertension; DM diabetes mellitus; COPD chronic obstructive pulmonary disease

## 2020-07-17 NOTE — Assessment & Plan Note (Signed)
No signs of active CNVM by examination or OCT

## 2020-07-17 NOTE — Assessment & Plan Note (Signed)
Subfoveal atrophy accounts for acuity OD and OS

## 2020-08-09 ENCOUNTER — Ambulatory Visit (INDEPENDENT_AMBULATORY_CARE_PROVIDER_SITE_OTHER): Payer: Medicare Other

## 2020-08-09 ENCOUNTER — Other Ambulatory Visit: Payer: Self-pay

## 2020-08-09 DIAGNOSIS — E538 Deficiency of other specified B group vitamins: Secondary | ICD-10-CM | POA: Diagnosis not present

## 2020-08-09 NOTE — Progress Notes (Signed)
Cyanocobalamin injection given to right deltoid.  Patient tolerated well. 

## 2020-08-23 ENCOUNTER — Other Ambulatory Visit: Payer: Self-pay

## 2020-08-23 ENCOUNTER — Encounter: Payer: Self-pay | Admitting: Family Medicine

## 2020-08-23 ENCOUNTER — Ambulatory Visit (INDEPENDENT_AMBULATORY_CARE_PROVIDER_SITE_OTHER): Payer: Medicare Other | Admitting: Family Medicine

## 2020-08-23 VITALS — BP 134/74 | HR 97 | Ht <= 58 in | Wt 126.0 lb

## 2020-08-23 DIAGNOSIS — K219 Gastro-esophageal reflux disease without esophagitis: Secondary | ICD-10-CM

## 2020-08-23 DIAGNOSIS — I10 Essential (primary) hypertension: Secondary | ICD-10-CM

## 2020-08-23 DIAGNOSIS — E782 Mixed hyperlipidemia: Secondary | ICD-10-CM

## 2020-08-23 LAB — CBC WITH DIFFERENTIAL/PLATELET
Basophils Absolute: 0.1 10*3/uL (ref 0.0–0.2)
Basos: 1 %
EOS (ABSOLUTE): 0.1 10*3/uL (ref 0.0–0.4)
Eos: 1 %
Hematocrit: 42.5 % (ref 34.0–46.6)
Hemoglobin: 14.3 g/dL (ref 11.1–15.9)
Immature Grans (Abs): 0 10*3/uL (ref 0.0–0.1)
Immature Granulocytes: 0 %
Lymphocytes Absolute: 0.5 10*3/uL — ABNORMAL LOW (ref 0.7–3.1)
Lymphs: 10 %
MCH: 29.7 pg (ref 26.6–33.0)
MCHC: 33.6 g/dL (ref 31.5–35.7)
MCV: 88 fL (ref 79–97)
Monocytes Absolute: 0.3 10*3/uL (ref 0.1–0.9)
Monocytes: 6 %
Neutrophils Absolute: 4.3 10*3/uL (ref 1.4–7.0)
Neutrophils: 82 %
Platelets: 228 10*3/uL (ref 150–450)
RBC: 4.81 x10E6/uL (ref 3.77–5.28)
RDW: 12.6 % (ref 11.7–15.4)
WBC: 5.3 10*3/uL (ref 3.4–10.8)

## 2020-08-23 LAB — CMP14+EGFR
ALT: 47 IU/L — ABNORMAL HIGH (ref 0–32)
AST: 51 IU/L — ABNORMAL HIGH (ref 0–40)
Albumin/Globulin Ratio: 1.8 (ref 1.2–2.2)
Albumin: 4.4 g/dL (ref 3.6–4.6)
Alkaline Phosphatase: 129 IU/L — ABNORMAL HIGH (ref 44–121)
BUN/Creatinine Ratio: 25 (ref 12–28)
BUN: 20 mg/dL (ref 8–27)
Bilirubin Total: 0.6 mg/dL (ref 0.0–1.2)
CO2: 23 mmol/L (ref 20–29)
Calcium: 9.6 mg/dL (ref 8.7–10.3)
Chloride: 105 mmol/L (ref 96–106)
Creatinine, Ser: 0.81 mg/dL (ref 0.57–1.00)
Globulin, Total: 2.5 g/dL (ref 1.5–4.5)
Glucose: 108 mg/dL — ABNORMAL HIGH (ref 65–99)
Potassium: 4.7 mmol/L (ref 3.5–5.2)
Sodium: 143 mmol/L (ref 134–144)
Total Protein: 6.9 g/dL (ref 6.0–8.5)
eGFR: 69 mL/min/{1.73_m2} (ref >59)

## 2020-08-23 LAB — LIPID PANEL
Chol/HDL Ratio: 2.7 ratio (ref 0.0–4.4)
Cholesterol, Total: 171 mg/dL (ref 100–199)
HDL: 63 mg/dL (ref >39)
LDL Chol Calc (NIH): 86 mg/dL (ref 0–99)
Triglycerides: 128 mg/dL (ref 0–149)
VLDL Cholesterol Cal: 22 mg/dL (ref 5–40)

## 2020-08-23 NOTE — Progress Notes (Signed)
BP 134/74   Pulse 97   Ht 4\' 9"  (1.448 m)   Wt 126 lb (57.2 kg)   SpO2 97%   BMI 27.27 kg/m    Subjective:   Patient ID: Ashley Peters, female    DOB: 1928/05/20, 87 y.o.   MRN: [id number]  HPI: Ashley Peters is a 87 y.o. female presenting on 08/23/2020 for Medical Management of Chronic Issues and Hypertension   HPI Hypertension Patient is currently on no medication currently and has been diet controlled, and their blood pressure today is 134/74. Patient denies any lightheadedness or dizziness. Patient denies headaches, blurred vision, chest pains, shortness of breath, or weakness. Denies any side effects from medication and is content with current medication.   GERD Patient is currently on no medication currently and has been doing well.  She denies any major symptoms or abdominal pain or belching or burping. She denies any blood in her stool or lightheadedness or dizziness.   Hyperlipidemia Patient is coming in for recheck of his hyperlipidemia. The patient is currently taking diet controlled but monitoring closely. They deny any issues with myalgias or history of liver damage from it. They deny any focal numbness or weakness or chest pain.   Relevant past medical, surgical, family and social history reviewed and updated as indicated. Interim medical history since our last visit reviewed. Allergies and medications reviewed and updated.  Review of Systems  Constitutional:  Negative for chills and fever.  HENT:  Negative for congestion, ear discharge and ear pain.   Eyes:  Negative for redness and visual disturbance.  Respiratory:  Negative for chest tightness and shortness of breath.   Cardiovascular:  Negative for chest pain and leg swelling.  Genitourinary:  Negative for difficulty urinating and dysuria.  Musculoskeletal:  Negative for back pain and gait problem.  Skin:  Negative for rash.  Neurological:  Negative for light-headedness and headaches.   Psychiatric/Behavioral:  Negative for agitation and behavioral problems.   All other systems reviewed and are negative.  Per HPI unless specifically indicated above   Allergies as of 08/23/2020       Reactions   Benicar [olmesartan Medoxomil]    Elevated Potassium   Amoxicillin Rash   Has patient had a PCN reaction causing immediate rash, facial/tongue/throat swelling, SOB or lightheadedness with hypotension: Yes Has patient had a PCN reaction causing severe rash involving mucus membranes or skin necrosis: No Has patient had a PCN reaction that required hospitalization: No Has patient had a PCN reaction occurring within the last 10 years: No If all of the above answers are "NO", then may proceed with Cephalosporin use.        Medication List        Accurate as of August 23, 2020  8:12 AM. If you have any questions, ask your nurse or doctor.          acetaminophen 500 MG tablet Commonly known as: TYLENOL Take 2 tablets (1,000 mg total) by mouth every 6 (six) hours as needed for moderate pain.   alendronate 70 MG tablet Commonly known as: FOSAMAX Take with a full glass of water on an empty stomach.   cholecalciferol 1000 units tablet Commonly known as: VITAMIN D Take 1,000 Units by mouth daily.   PRESERVISION AREDS 2 PO Take 1 capsule by mouth 2 (two) times daily.         Objective:   BP 134/74   Pulse 97   Ht 4\' 9"  (1.448 m)  Wt 126 lb (57.2 kg)   SpO2 97%   BMI 27.27 kg/m   Wt Readings from Last 3 Encounters:  08/23/20 126 lb (57.2 kg)  02/24/20 133 lb 11.2 oz (60.6 kg)  02/22/20 133 lb (60.3 kg)    Physical Exam Vitals and nursing note reviewed.  Constitutional:      General: She is not in acute distress.    Appearance: She is well-developed. She is not diaphoretic.  Eyes:     Conjunctiva/sclera: Conjunctivae normal.  Cardiovascular:     Rate and Rhythm: Normal rate and regular rhythm.     Heart sounds: Normal heart sounds. No murmur  heard. Pulmonary:     Effort: Pulmonary effort is normal. No respiratory distress.     Breath sounds: Normal breath sounds. No wheezing.  Musculoskeletal:        General: No tenderness. Normal range of motion.  Skin:    General: Skin is warm and dry.     Findings: No rash.  Neurological:     Mental Status: She is alert and oriented to person, place, and time.     Coordination: Coordination normal.  Psychiatric:        Behavior: Behavior normal.      Assessment & Plan:   Problem List Items Addressed This Visit       Cardiovascular and Mediastinum   HTN (hypertension) - Primary   Relevant Orders   CMP14+EGFR     Digestive   GERD (gastroesophageal reflux disease)   Relevant Orders   CBC with Differential/Platelet     Other   Hyperlipidemia   Relevant Orders   Lipid panel    Continue current treatment, she is not due for bone density yet, she continues to follow with oncology for her 5-year visit later this month.  She would then be colon cancer free for 5 years.   Follow up plan: No follow-ups on file.  Counseling provided for all of the vaccine components No orders of the defined types were placed in this encounter.   Caryl Pina, MD [facility] 08/23/2020, 8:12 AM

## 2020-08-30 ENCOUNTER — Other Ambulatory Visit: Payer: Self-pay

## 2020-08-30 DIAGNOSIS — R7989 Other specified abnormal findings of blood chemistry: Secondary | ICD-10-CM

## 2020-09-07 DIAGNOSIS — S39012A Strain of muscle, fascia and tendon of lower back, initial encounter: Secondary | ICD-10-CM | POA: Diagnosis not present

## 2020-09-11 ENCOUNTER — Ambulatory Visit: Payer: Medicare Other

## 2020-09-12 ENCOUNTER — Other Ambulatory Visit: Payer: Self-pay

## 2020-09-12 ENCOUNTER — Ambulatory Visit (INDEPENDENT_AMBULATORY_CARE_PROVIDER_SITE_OTHER): Payer: Medicare Other | Admitting: Family Medicine

## 2020-09-12 ENCOUNTER — Encounter: Payer: Self-pay | Admitting: Family Medicine

## 2020-09-12 ENCOUNTER — Ambulatory Visit (INDEPENDENT_AMBULATORY_CARE_PROVIDER_SITE_OTHER): Payer: Medicare Other

## 2020-09-12 VITALS — BP 135/78 | HR 85 | Ht <= 58 in | Wt 124.0 lb

## 2020-09-12 DIAGNOSIS — M545 Low back pain, unspecified: Secondary | ICD-10-CM | POA: Diagnosis not present

## 2020-09-12 DIAGNOSIS — E538 Deficiency of other specified B group vitamins: Secondary | ICD-10-CM | POA: Diagnosis not present

## 2020-09-12 NOTE — Progress Notes (Signed)
BP 135/78   Pulse 85   Ht 4\' 9"  (1.448 m)   Wt 124 lb (56.2 kg)   SpO2 96%   BMI 26.83 kg/m    Subjective:   Patient ID: Ashley Peters, female    DOB: [birth year], 87 y.o.   MRN: [id number]  HPI: Ashley Peters is a 87 y.o. female presenting on 09/12/2020 for Back Pain (Right lower back)   HPI Right lower back pain Patient is coming in for right lower back pain.  This is an urgent care follow-up.  She said the pain started about a week ago but cannot recall any specific incident or trauma or fall that caused it.  She says it is improving since they gave her some prednisone.  She did take a little bit of a muscle relaxer as well but did not like the way it made her feel so she has not taken it much and she has been taking Tylenol some as well.  She says the pain hurts more when she stands up or twists.  Relevant past medical, surgical, family and social history reviewed and updated as indicated. Interim medical history since our last visit reviewed. Allergies and medications reviewed and updated.  Review of Systems  Constitutional:  Negative for chills and fever.  Eyes:  Negative for visual disturbance.  Respiratory:  Negative for chest tightness and shortness of breath.   Cardiovascular:  Negative for chest pain and leg swelling.  Genitourinary:  Negative for difficulty urinating, dysuria, frequency, hematuria and urgency.  Musculoskeletal:  Positive for back pain.  Skin:  Negative for rash.  Neurological:  Negative for weakness and numbness.  Psychiatric/Behavioral:  Negative for agitation and behavioral problems.   All other systems reviewed and are negative.  Per HPI unless specifically indicated above   Allergies as of 09/12/2020       Reactions   Benicar [olmesartan Medoxomil]    Elevated Potassium   Amoxicillin Rash   Has patient had a PCN reaction causing immediate rash, facial/tongue/throat swelling, SOB or lightheadedness with hypotension: Yes Has patient had a PCN  reaction causing severe rash involving mucus membranes or skin necrosis: No Has patient had a PCN reaction that required hospitalization: No Has patient had a PCN reaction occurring within the last 10 years: No If all of the above answers are "NO", then may proceed with Cephalosporin use.        Medication List        Accurate as of September 12, 2020 11:58 AM. If you have any questions, ask your nurse or doctor.          acetaminophen 500 MG tablet Commonly known as: TYLENOL Take 2 tablets (1,000 mg total) by mouth every 6 (six) hours as needed for moderate pain.   alendronate 70 MG tablet Commonly known as: FOSAMAX Take with a full glass of water on an empty stomach.   cholecalciferol 1000 units tablet Commonly known as: VITAMIN D Take 1,000 Units by mouth daily.   PRESERVISION AREDS 2 PO Take 1 capsule by mouth 2 (two) times daily.         Objective:   BP 135/78   Pulse 85   Ht 4\' 9"  (1.448 m)   Wt 124 lb (56.2 kg)   SpO2 96%   BMI 26.83 kg/m   Wt Readings from Last 3 Encounters:  09/12/20 124 lb (56.2 kg)  08/23/20 126 lb (57.2 kg)  02/24/20 133 lb 11.2 oz (60.6 kg)  Physical Exam Vitals and nursing note reviewed.  Constitutional:      General: She is not in acute distress.    Appearance: She is well-developed. She is not diaphoretic.  Eyes:     Conjunctiva/sclera: Conjunctivae normal.  Musculoskeletal:        General: Normal range of motion.     Lumbar back: Tenderness (Right lumbar tenderness, with rotation.) present. No bony tenderness. Negative right straight leg raise test and negative left straight leg raise test.  Skin:    General: Skin is warm and dry.     Findings: No rash.  Neurological:     Mental Status: She is alert and oriented to person, place, and time.     Coordination: Coordination normal.  Psychiatric:        Behavior: Behavior normal.    Lumbar x-ray: Pending  Assessment & Plan:   Problem List Items Addressed This Visit    None Visit Diagnoses     Right lumbar pain    -  Primary   Relevant Orders   DG Lumbar Spine 2-3 Views       Recommended to finish the prednisone for which she has a couple days left and then from there to start taking Advil or Aleve in the morning with food once a day and can still use some Tylenol in the evening as well. Follow up plan: Return if symptoms worsen or fail to improve.  Counseling provided for all of the vaccine components Orders Placed This Encounter  Procedures   DG Lumbar Spine 2-3 Views    Caryl Pina, MD [facility] 09/12/2020, 11:58 AM

## 2020-09-14 ENCOUNTER — Telehealth: Payer: Self-pay | Admitting: Family Medicine

## 2020-09-14 NOTE — Telephone Encounter (Signed)
Please review

## 2020-09-14 NOTE — Telephone Encounter (Signed)
Patient wants someone to call her with xray results.

## 2020-09-17 NOTE — Telephone Encounter (Signed)
Patient aware of results.

## 2020-09-17 NOTE — Telephone Encounter (Signed)
X-ray shows some arthritis but not much even for her age.  She has a little bit of scoliosis but not much.  Think this is more muscular in pain

## 2020-09-19 ENCOUNTER — Other Ambulatory Visit: Payer: Self-pay

## 2020-09-19 ENCOUNTER — Encounter (HOSPITAL_BASED_OUTPATIENT_CLINIC_OR_DEPARTMENT_OTHER): Payer: Self-pay | Admitting: Emergency Medicine

## 2020-09-19 ENCOUNTER — Emergency Department (HOSPITAL_BASED_OUTPATIENT_CLINIC_OR_DEPARTMENT_OTHER): Payer: Medicare Other | Admitting: Radiology

## 2020-09-19 ENCOUNTER — Inpatient Hospital Stay (HOSPITAL_BASED_OUTPATIENT_CLINIC_OR_DEPARTMENT_OTHER)
Admission: EM | Admit: 2020-09-19 | Discharge: 2020-09-22 | DRG: 445 | Disposition: A | Discharge status: Home / Self Care | Payer: Medicare Other | Attending: Family Medicine | Admitting: Family Medicine

## 2020-09-19 ENCOUNTER — Emergency Department (HOSPITAL_BASED_OUTPATIENT_CLINIC_OR_DEPARTMENT_OTHER): Payer: Medicare Other

## 2020-09-19 DIAGNOSIS — R7989 Other specified abnormal findings of blood chemistry: Secondary | ICD-10-CM

## 2020-09-19 DIAGNOSIS — K838 Other specified diseases of biliary tract: Secondary | ICD-10-CM | POA: Diagnosis not present

## 2020-09-19 DIAGNOSIS — K219 Gastro-esophageal reflux disease without esophagitis: Secondary | ICD-10-CM | POA: Diagnosis present

## 2020-09-19 DIAGNOSIS — K8020 Calculus of gallbladder without cholecystitis without obstruction: Secondary | ICD-10-CM

## 2020-09-19 DIAGNOSIS — Z800 Family history of malignant neoplasm of digestive organs: Secondary | ICD-10-CM

## 2020-09-19 DIAGNOSIS — K759 Inflammatory liver disease, unspecified: Secondary | ICD-10-CM | POA: Diagnosis present

## 2020-09-19 DIAGNOSIS — R160 Hepatomegaly, not elsewhere classified: Secondary | ICD-10-CM

## 2020-09-19 DIAGNOSIS — K831 Obstruction of bile duct: Secondary | ICD-10-CM

## 2020-09-19 DIAGNOSIS — Z79899 Other long term (current) drug therapy: Secondary | ICD-10-CM

## 2020-09-19 DIAGNOSIS — R945 Abnormal results of liver function studies: Secondary | ICD-10-CM | POA: Diagnosis not present

## 2020-09-19 DIAGNOSIS — I10 Essential (primary) hypertension: Secondary | ICD-10-CM | POA: Diagnosis present

## 2020-09-19 DIAGNOSIS — Z9049 Acquired absence of other specified parts of digestive tract: Secondary | ICD-10-CM

## 2020-09-19 DIAGNOSIS — J984 Other disorders of lung: Secondary | ICD-10-CM | POA: Diagnosis not present

## 2020-09-19 DIAGNOSIS — Z807 Family history of other malignant neoplasms of lymphoid, hematopoietic and related tissues: Secondary | ICD-10-CM

## 2020-09-19 DIAGNOSIS — Z7983 Long term (current) use of bisphosphonates: Secondary | ICD-10-CM

## 2020-09-19 DIAGNOSIS — E860 Dehydration: Secondary | ICD-10-CM | POA: Diagnosis present

## 2020-09-19 DIAGNOSIS — Z820 Family history of epilepsy and other diseases of the nervous system: Secondary | ICD-10-CM

## 2020-09-19 DIAGNOSIS — R17 Unspecified jaundice: Secondary | ICD-10-CM

## 2020-09-19 DIAGNOSIS — N179 Acute kidney failure, unspecified: Secondary | ICD-10-CM | POA: Diagnosis not present

## 2020-09-19 DIAGNOSIS — Z20822 Contact with and (suspected) exposure to covid-19: Secondary | ICD-10-CM | POA: Diagnosis not present

## 2020-09-19 DIAGNOSIS — Z880 Allergy status to penicillin: Secondary | ICD-10-CM

## 2020-09-19 DIAGNOSIS — Z90710 Acquired absence of both cervix and uterus: Secondary | ICD-10-CM

## 2020-09-19 DIAGNOSIS — E785 Hyperlipidemia, unspecified: Secondary | ICD-10-CM | POA: Diagnosis present

## 2020-09-19 DIAGNOSIS — R079 Chest pain, unspecified: Secondary | ICD-10-CM | POA: Diagnosis not present

## 2020-09-19 DIAGNOSIS — Z8049 Family history of malignant neoplasm of other genital organs: Secondary | ICD-10-CM

## 2020-09-19 DIAGNOSIS — Z803 Family history of malignant neoplasm of breast: Secondary | ICD-10-CM

## 2020-09-19 DIAGNOSIS — K5750 Diverticulosis of both small and large intestine without perforation or abscess without bleeding: Secondary | ICD-10-CM | POA: Diagnosis present

## 2020-09-19 DIAGNOSIS — Z85038 Personal history of other malignant neoplasm of large intestine: Secondary | ICD-10-CM

## 2020-09-19 DIAGNOSIS — R0789 Other chest pain: Secondary | ICD-10-CM | POA: Diagnosis not present

## 2020-09-19 DIAGNOSIS — R1013 Epigastric pain: Secondary | ICD-10-CM | POA: Diagnosis present

## 2020-09-19 DIAGNOSIS — K5730 Diverticulosis of large intestine without perforation or abscess without bleeding: Secondary | ICD-10-CM | POA: Diagnosis not present

## 2020-09-19 DIAGNOSIS — E538 Deficiency of other specified B group vitamins: Secondary | ICD-10-CM | POA: Diagnosis present

## 2020-09-19 DIAGNOSIS — Z823 Family history of stroke: Secondary | ICD-10-CM

## 2020-09-19 DIAGNOSIS — K8031 Calculus of bile duct with cholangitis, unspecified, with obstruction: Principal | ICD-10-CM | POA: Diagnosis present

## 2020-09-19 DIAGNOSIS — Z833 Family history of diabetes mellitus: Secondary | ICD-10-CM

## 2020-09-19 DIAGNOSIS — Z8249 Family history of ischemic heart disease and other diseases of the circulatory system: Secondary | ICD-10-CM

## 2020-09-19 DIAGNOSIS — K449 Diaphragmatic hernia without obstruction or gangrene: Secondary | ICD-10-CM | POA: Diagnosis not present

## 2020-09-19 LAB — BASIC METABOLIC PANEL
Anion gap: 11 (ref 5–15)
BUN: 19 mg/dL (ref 8–23)
CO2: 25 mmol/L (ref 22–32)
Calcium: 9.7 mg/dL (ref 8.9–10.3)
Chloride: 104 mmol/L (ref 98–111)
Creatinine, Ser: 1.06 mg/dL — ABNORMAL HIGH (ref 0.44–1.00)
GFR, Estimated: 50 mL/min — ABNORMAL LOW (ref >60)
Glucose, Bld: 168 mg/dL — ABNORMAL HIGH (ref 70–99)
Potassium: 4.1 mmol/L (ref 3.5–5.1)
Sodium: 140 mmol/L (ref 135–145)

## 2020-09-19 LAB — HEPATIC FUNCTION PANEL
ALT: 427 U/L — ABNORMAL HIGH (ref 0–44)
AST: 502 U/L — ABNORMAL HIGH (ref 15–41)
Albumin: 3.8 g/dL (ref 3.5–5.0)
Alkaline Phosphatase: 370 U/L — ABNORMAL HIGH (ref 38–126)
Bilirubin, Direct: 2.6 mg/dL — ABNORMAL HIGH (ref 0.0–0.2)
Indirect Bilirubin: 1.9 mg/dL — ABNORMAL HIGH (ref 0.3–0.9)
Total Bilirubin: 4.5 mg/dL — ABNORMAL HIGH (ref 0.3–1.2)
Total Protein: 6.4 g/dL — ABNORMAL LOW (ref 6.5–8.1)

## 2020-09-19 LAB — LIPASE, BLOOD: Lipase: 23 U/L (ref 11–51)

## 2020-09-19 LAB — CBC
HCT: 43.4 % (ref 36.0–46.0)
Hemoglobin: 14 g/dL (ref 12.0–15.0)
MCH: 29.3 pg (ref 26.0–34.0)
MCHC: 32.3 g/dL (ref 30.0–36.0)
MCV: 90.8 fL (ref 80.0–100.0)
Platelets: 205 10*3/uL (ref 150–400)
RBC: 4.78 MIL/uL (ref 3.87–5.11)
RDW: 13 % (ref 11.5–15.5)
WBC: 17.9 10*3/uL — ABNORMAL HIGH (ref 4.0–10.5)
nRBC: 0 % (ref 0.0–0.2)

## 2020-09-19 LAB — RESP PANEL BY RT-PCR (FLU A&B, COVID) ARPGX2
Influenza A by PCR: NEGATIVE
Influenza B by PCR: NEGATIVE
SARS Coronavirus 2 by RT PCR: NEGATIVE

## 2020-09-19 LAB — TROPONIN I (HIGH SENSITIVITY)
Troponin I (High Sensitivity): 9 ng/L (ref <18)
Troponin I (High Sensitivity): 6 ng/L (ref <18)

## 2020-09-19 MED ORDER — ACETAMINOPHEN 500 MG PO TABS
1000.0000 mg | ORAL_TABLET | Freq: Once | ORAL | Status: AC
  Administered 2020-09-19: 1000 mg via ORAL
  Filled 2020-09-19: qty 2

## 2020-09-19 MED ORDER — OXYCODONE HCL 5 MG PO TABS
2.5000 mg | ORAL_TABLET | Freq: Once | ORAL | Status: AC
  Administered 2020-09-19: 2.5 mg via ORAL
  Filled 2020-09-19: qty 1

## 2020-09-19 MED ORDER — IOHEXOL 350 MG/ML SOLN
100.0000 mL | Freq: Once | INTRAVENOUS | Status: AC | PRN
  Administered 2020-09-19: 100 mL via INTRAVENOUS

## 2020-09-19 NOTE — ED Notes (Signed)
Patient transported to CT 

## 2020-09-19 NOTE — ED Triage Notes (Signed)
Pt states she has had chest pain for the past 2 days. Pt stated the pain was originally a 10 but is currently rating her pain now at a 4. Pt describes pain as a dull ache that iin the middle of her chest.

## 2020-09-19 NOTE — ED Notes (Signed)
Called [facility] 

## 2020-09-19 NOTE — ED Provider Notes (Signed)
[facility] EMERGENCY DEPT Provider Note   CSN: [id number] Arrival date & time: 09/19/20  1829     History Chief Complaint  Patient presents with   Chest Pain    Ashley Peters is a 87 y.o. female.  87 yo F with a chief complaints of chest pain that radiates all the way down her abdomen to her pelvis.  This is been going on for couple days.  Preceded by low back pain that is new for her.  Had seen her family doctor this week and was told it was likely musculoskeletal started on medicine without significant improvement.  Worse with movement twisting palpation.  She denies trauma to the area denies falls.  The history is provided by the patient.  Chest Pain Pain location:  Substernal area Pain quality: aching   Radiates to: abdomen. Pain severity:  Moderate Onset quality:  Gradual Duration:  2 days Timing:  Constant Progression:  Worsening Chronicity:  New Relieved by:  Nothing Worsened by:  Certain positions Ineffective treatments:  None tried Associated symptoms: abdominal pain   Associated symptoms: no dizziness, no fever, no headache, no nausea, no palpitations, no shortness of breath and no vomiting       Past Medical History:  Diagnosis Date   Anemia    Blood transfusion without reported diagnosis    Cancer ([place])    Colon cancer ([place]) dx'd 10/2016   Diverticulosis    Family history of breast cancer    Family history of colon cancer    Family history of uterine cancer    GERD (gastroesophageal reflux disease)    History of kidney stones    Hyperlipidemia    Hypertension    Macular degeneration    Osteopenia     Patient Active Problem List   Diagnosis Date Noted   Hepatitis 09/19/2020   Advanced nonexudative age-related macular degeneration of left eye with subfoveal involvement 04/17/2020   Age-related osteoporosis without current pathological fracture 02/22/2020   Vitamin D deficiency 02/22/2020   Advanced nonexudative age-related macular  degeneration of right eye with subfoveal involvement 08/01/2019   Exudative age-related macular degeneration of left eye with inactive choroidal neovascularization ([place]) 07/05/2019   Exudative age-related macular degeneration of right eye with inactive choroidal neovascularization ([place]) 07/05/2019   Vitreomacular adhesion of left eye 07/05/2019   Cystoid macular edema of left eye 07/05/2019   B12 deficiency 03/24/2017   Genetic testing 12/23/2016   History of colon cancer    Family history of uterine cancer    Family history of breast cancer    Iron deficiency anemia due to chronic blood loss 11/07/2016   Cancer of right colon ([place]) 10/14/2016   Hyperlipidemia 11/01/2014   HTN (hypertension) 07/15/2010   GERD (gastroesophageal reflux disease) 07/15/2010   Diverticulosis 07/15/2010    Past Surgical History:  Procedure Laterality Date   ABDOMINAL HYSTERECTOMY     ablation tr Great saphenous vein  10/17/2009   BREAST EXCISIONAL BIOPSY Bilateral 1971   brest biopsy  1971   CHOLECYSTECTOMY     EPIGASTRIC HERNIA REPAIR N/A 10/14/2016   Procedure: REPAIR OF EPIGASTRIC VENTRAL HERNIA;  Surgeon: Donnie Mesa, MD;  Location: [facility];  Service: General;  Laterality: N/A;   lt. distal ureteral stone extraction  09/14/1991   PARTIAL COLECTOMY Right 10/14/2016   Procedure: OPEN RIGHT HEMICOLECTOMY;  Surgeon: Donnie Mesa, MD;  Location: [facility] OR;  Service: General;  Laterality: Right;     OB History   No obstetric history on  file.     Family History  Problem Relation Age of Onset   Stroke Father 82   Breast cancer Sister        dx 89's, died at 31   Seizures Son    Diabetes Brother    Kidney disease Brother    Heart disease Brother    Epilepsy Son    Colon cancer Other 69   Uterine cancer Other 58   Hodgkin's lymphoma Other    Breast cancer Other     Social History   Tobacco Use   Smoking status: Never   Smokeless tobacco: Never  Vaping Use   Vaping Use: Never used  Substance  Use Topics   Alcohol use: No   Drug use: No    Home Medications Prior to Admission medications   Medication Sig Start Date End Date Taking? Authorizing Provider  acetaminophen (TYLENOL) 500 MG tablet Take 2 tablets (1,000 mg total) by mouth every 6 (six) hours as needed for moderate pain. 10/17/16   Donnie Mesa, MD  alendronate (FOSAMAX) 70 MG tablet Take with a full glass of water on an empty stomach. 02/22/20   Dettinger, [name], MD  cholecalciferol (VITAMIN D) 1000 units tablet Take 1,000 Units by mouth daily.    [provider]  Multiple Vitamins-Minerals (PRESERVISION AREDS 2 PO) Take 1 capsule by mouth 2 (two) times daily.    [provider]    Allergies    Benicar [olmesartan medoxomil] and Amoxicillin  Review of Systems   Review of Systems  Constitutional:  Negative for chills and fever.  HENT:  Negative for congestion and rhinorrhea.   Eyes:  Negative for redness and visual disturbance.  Respiratory:  Negative for shortness of breath and wheezing.   Cardiovascular:  Positive for chest pain. Negative for palpitations.  Gastrointestinal:  Positive for abdominal pain. Negative for nausea and vomiting.  Genitourinary:  Negative for dysuria and urgency.  Musculoskeletal:  Negative for arthralgias and myalgias.  Skin:  Negative for pallor and wound.  Neurological:  Negative for dizziness and headaches.   Physical Exam Updated Vital Signs BP 118/63   Pulse 86   Temp 97.8 F (36.6 C) (Oral)   Resp 18   Ht 5' (1.524 m)   Wt 54.4 kg   SpO2 92%   BMI 23.44 kg/m   Physical Exam Vitals and nursing note reviewed.  Constitutional:      General: She is not in acute distress.    Appearance: She is well-developed. She is not diaphoretic.  HENT:     Head: Normocephalic and atraumatic.  Eyes:     Pupils: Pupils are equal, round, and reactive to light.  Cardiovascular:     Rate and Rhythm: Normal rate and regular rhythm.     Heart sounds: No murmur  heard.   No friction rub. No gallop.  Pulmonary:     Effort: Pulmonary effort is normal.     Breath sounds: No wheezing or rales.  Chest:     Chest wall: Tenderness present.     Comments: Pain with palpation of the sternum. Abdominal:     General: There is no distension.     Palpations: Abdomen is soft.     Tenderness: There is no abdominal tenderness.  Musculoskeletal:        General: No tenderness.     Cervical back: Normal range of motion and neck supple.  Skin:    General: Skin is warm and dry.  Neurological:  Mental Status: She is alert and oriented to person, place, and time.  Psychiatric:        Behavior: Behavior normal.    ED Results / Procedures / Treatments   Labs (all labs ordered are listed, but only abnormal results are displayed) Labs Reviewed  BASIC METABOLIC PANEL - Abnormal; Notable for the following components:      Result Value   Glucose, Bld 168 (*)    Creatinine, Ser 1.06 (*)    GFR, Estimated 50 (*)    All other components within normal limits  CBC - Abnormal; Notable for the following components:   WBC 17.9 (*)    All other components within normal limits  HEPATIC FUNCTION PANEL - Abnormal; Notable for the following components:   Total Protein 6.4 (*)    AST 502 (*)    ALT 427 (*)    Alkaline Phosphatase 370 (*)    Total Bilirubin 4.5 (*)    Bilirubin, Direct 2.6 (*)    Indirect Bilirubin 1.9 (*)    All other components within normal limits  RESP PANEL BY RT-PCR (FLU A&B, COVID) ARPGX2  LIPASE, BLOOD  HEPATITIS PANEL, ACUTE  TROPONIN I (HIGH SENSITIVITY)  TROPONIN I (HIGH SENSITIVITY)    EKG EKG Interpretation  Date/Time:  Wednesday September 19 2020 18:35:50 EDT Ventricular Rate:  73 PR Interval:  161 QRS Duration: 71 QT Interval:  399 QTC Calculation: 440 R Axis:   -19 Text Interpretation: Sinus rhythm Borderline left axis deviation No significant change since last tracing Confirmed by Deno Etienne [phone]) on 09/19/2020 6:41:00  PM  Radiology DG Chest 2 View  Result Date: 09/19/2020 CLINICAL DATA:  Chest pain for 2 days EXAM: CHEST - 2 VIEW COMPARISON:  Chest CT 08/25/2019 FINDINGS: The cardiac silhouette, mediastinal and hilar contours are normal. Streaky left basilar scarring changes. No infiltrates, edema or effusions. No pulmonary lesions. The bony thorax is intact. IMPRESSION: Streaky left basilar scarring changes.  No acute pulmonary findings. Electronically Signed   By: Marijo Sanes M.D.   On: 09/19/2020 19:29   CT Angio Chest/Abd/Pel for Dissection W and/or Wo Contrast  Result Date: 09/19/2020 CLINICAL DATA:  Chest and abdominal pain. EXAM: CT ANGIOGRAPHY CHEST, ABDOMEN AND PELVIS TECHNIQUE: Multidetector CT imaging through the chest, abdomen and pelvis was performed using the standard protocol during bolus administration of intravenous contrast. Multiplanar reconstructed images and MIPs were obtained and reviewed to evaluate the vascular anatomy. CONTRAST:  125mL OMNIPAQUE IOHEXOL 350 MG/ML SOLN COMPARISON:  CT scan 08/24/2018 FINDINGS: CTA CHEST FINDINGS Cardiovascular: The heart is normal in size. No pericardial effusion. The aorta is normal in caliber. There is mild tortuosity but no dissection. Remarkably little atherosclerotic calcifications for the patient's age. Minimal coronary artery calcifications. The pulmonary arteries are normal. No filling defects to suggest pulmonary embolism. Mediastinum/Nodes: No mediastinal or hilar mass or adenopathy. The esophagus is grossly normal. There is a small hiatal hernia noted. Lungs/Pleura: No acute pulmonary findings. No worrisome pulmonary lesions. No pleural effusions or pleural nodules. Musculoskeletal: No significant bony findings. No breast masses, supraclavicular or axillary adenopathy. Review of the MIP images confirms the above findings. CTA ABDOMEN AND PELVIS FINDINGS VASCULAR Aorta: Normal caliber. Minimal scattered atherosclerotic calcifications for age. Celiac:  Patent SMA: Patent Renals: Patent IMA: Patent Inflow: Patent Veins: Unremarkable Review of the MIP images confirms the above findings. NON-VASCULAR Hepatobiliary: Stable intra and extrahepatic biliary dilatation. No hepatic lesions are identified. Pancreas: No mass, inflammation or ductal dilatation. Spleen: Normal size.  No focal lesions. Adrenals/Urinary Tract: The adrenal glands are unremarkable and stable. Stable renal cysts. No worrisome renal lesions. The bladder is unremarkable. Stomach/Bowel: The stomach, duodenum, small and colon are grossly normal. No acute inflammatory process, mass lesion or obstructive findings. There is significant diffuse colonic diverticulosis and remote surgical changes involving the right colon but no findings to suggest acute diverticulitis. Lymphatic: No mesenteric or retroperitoneal or pelvic adenopathy. Reproductive: The uterus is surgically absent. Both ovaries are still present and appear normal. Other: No pelvic mass or adenopathy. No free pelvic fluid collections. No inguinal mass or adenopathy. Anterior abdominal wall hernia containing part of the transverse colon, unchanged since prior study. Musculoskeletal: No significant bony findings. Review of the MIP images confirms the above findings. IMPRESSION: 1. Normal thoracic and abdominal aorta. No aneurysm or dissection. 2. No CT findings for pulmonary embolism. 3. No acute pulmonary findings. 4. Stable intra and extrahepatic biliary dilatation. 5. Diffuse colonic diverticulosis without findings for acute diverticulitis. 6. Stable anterior abdominal wall hernia containing part of the transverse colon. Electronically Signed   By: Marijo Sanes M.D.   On: 09/19/2020 20:22    Procedures Procedures   Medications Ordered in ED Medications  acetaminophen (TYLENOL) tablet 1,000 mg (1,000 mg Oral Given 09/19/20 1924)  oxyCODONE (Oxy IR/ROXICODONE) immediate release tablet 2.5 mg (2.5 mg Oral Given 09/19/20 1924)  iohexol  (OMNIPAQUE) 350 MG/ML injection 100 mL (100 mLs Intravenous Contrast Given 09/19/20 1957)    ED Course  I have reviewed the triage vital signs and the nursing notes.  Pertinent labs & imaging results that were available during my care of the patient were reviewed by me and considered in my medical decision making (see chart for details).    MDM Rules/Calculators/A&P                          87 yo F with a chief complaints of chest pain that radiates down to her pelvis.  Is been going on for couple days.  Likely musculoskeletal based on history and exam though I am concerned because the patient has no history of musculoskeletal back pain or chest pain for that matter and now has sharp pain that radiates down her abdomen.  Will obtain a CT angiogram.  CT angiogram without obvious pathology.  Patient's troponins are negative.  Her LFTs however are significantly elevated.  I discussed this with Dr. Fuller Plan, [facility] gastroenterology.  Recommends admission for MRCP and they will evaluate her in the morning.  The patients results and plan were reviewed and discussed.   Any x-rays performed were independently reviewed by myself.   Differential diagnosis were considered with the presenting HPI.  Medications  acetaminophen (TYLENOL) tablet 1,000 mg (1,000 mg Oral Given 09/19/20 1924)  oxyCODONE (Oxy IR/ROXICODONE) immediate release tablet 2.5 mg (2.5 mg Oral Given 09/19/20 1924)  iohexol (OMNIPAQUE) 350 MG/ML injection 100 mL (100 mLs Intravenous Contrast Given 09/19/20 1957)    Vitals:   09/19/20 1845 09/19/20 1949 09/19/20 2030 09/19/20 2131  BP: 104/63 (!) 118/55 (!) 126/50 118/63  Pulse: 81 76 86 86  Resp: (!) 27 (!) 23 (!) 21 18  Temp:      TempSrc:      SpO2: 99% 98% 92% 92%  Weight:      Height:        Final diagnoses:  Hyperbilirubinemia  LFT elevation     Final Clinical Impression(s) / ED Diagnoses Final diagnoses:  Hyperbilirubinemia  LFT elevation    Rx / DC Orders ED  Discharge Orders     None        Deno Etienne, DO 09/19/20 2303

## 2020-09-19 NOTE — ED Notes (Signed)
Pain also reports pain when she breathes

## 2020-09-20 ENCOUNTER — Inpatient Hospital Stay (HOSPITAL_COMMUNITY): Payer: Medicare Other

## 2020-09-20 ENCOUNTER — Encounter (HOSPITAL_COMMUNITY): Payer: Self-pay | Admitting: Family Medicine

## 2020-09-20 DIAGNOSIS — D500 Iron deficiency anemia secondary to blood loss (chronic): Secondary | ICD-10-CM | POA: Diagnosis not present

## 2020-09-20 DIAGNOSIS — N179 Acute kidney failure, unspecified: Secondary | ICD-10-CM | POA: Diagnosis present

## 2020-09-20 DIAGNOSIS — R1013 Epigastric pain: Secondary | ICD-10-CM | POA: Diagnosis not present

## 2020-09-20 DIAGNOSIS — Z8249 Family history of ischemic heart disease and other diseases of the circulatory system: Secondary | ICD-10-CM | POA: Diagnosis not present

## 2020-09-20 DIAGNOSIS — Z833 Family history of diabetes mellitus: Secondary | ICD-10-CM | POA: Diagnosis not present

## 2020-09-20 DIAGNOSIS — K831 Obstruction of bile duct: Secondary | ICD-10-CM

## 2020-09-20 DIAGNOSIS — Z8049 Family history of malignant neoplasm of other genital organs: Secondary | ICD-10-CM | POA: Diagnosis not present

## 2020-09-20 DIAGNOSIS — E860 Dehydration: Secondary | ICD-10-CM | POA: Diagnosis present

## 2020-09-20 DIAGNOSIS — K8051 Calculus of bile duct without cholangitis or cholecystitis with obstruction: Secondary | ICD-10-CM | POA: Diagnosis not present

## 2020-09-20 DIAGNOSIS — Z20822 Contact with and (suspected) exposure to covid-19: Secondary | ICD-10-CM | POA: Diagnosis present

## 2020-09-20 DIAGNOSIS — Z85038 Personal history of other malignant neoplasm of large intestine: Secondary | ICD-10-CM

## 2020-09-20 DIAGNOSIS — Z820 Family history of epilepsy and other diseases of the nervous system: Secondary | ICD-10-CM | POA: Diagnosis not present

## 2020-09-20 DIAGNOSIS — E538 Deficiency of other specified B group vitamins: Secondary | ICD-10-CM | POA: Diagnosis present

## 2020-09-20 DIAGNOSIS — R945 Abnormal results of liver function studies: Secondary | ICD-10-CM

## 2020-09-20 DIAGNOSIS — E785 Hyperlipidemia, unspecified: Secondary | ICD-10-CM | POA: Diagnosis present

## 2020-09-20 DIAGNOSIS — Z9049 Acquired absence of other specified parts of digestive tract: Secondary | ICD-10-CM | POA: Diagnosis not present

## 2020-09-20 DIAGNOSIS — R17 Unspecified jaundice: Secondary | ICD-10-CM

## 2020-09-20 DIAGNOSIS — K839 Disease of biliary tract, unspecified: Secondary | ICD-10-CM | POA: Diagnosis not present

## 2020-09-20 DIAGNOSIS — R7989 Other specified abnormal findings of blood chemistry: Secondary | ICD-10-CM

## 2020-09-20 DIAGNOSIS — Z807 Family history of other malignant neoplasms of lymphoid, hematopoietic and related tissues: Secondary | ICD-10-CM | POA: Diagnosis not present

## 2020-09-20 DIAGNOSIS — K759 Inflammatory liver disease, unspecified: Secondary | ICD-10-CM | POA: Diagnosis present

## 2020-09-20 DIAGNOSIS — Z803 Family history of malignant neoplasm of breast: Secondary | ICD-10-CM | POA: Diagnosis not present

## 2020-09-20 DIAGNOSIS — R160 Hepatomegaly, not elsewhere classified: Secondary | ICD-10-CM

## 2020-09-20 DIAGNOSIS — K5750 Diverticulosis of both small and large intestine without perforation or abscess without bleeding: Secondary | ICD-10-CM | POA: Diagnosis present

## 2020-09-20 DIAGNOSIS — Z823 Family history of stroke: Secondary | ICD-10-CM | POA: Diagnosis not present

## 2020-09-20 DIAGNOSIS — Z7983 Long term (current) use of bisphosphonates: Secondary | ICD-10-CM | POA: Diagnosis not present

## 2020-09-20 DIAGNOSIS — I10 Essential (primary) hypertension: Secondary | ICD-10-CM | POA: Diagnosis present

## 2020-09-20 DIAGNOSIS — Z90710 Acquired absence of both cervix and uterus: Secondary | ICD-10-CM | POA: Diagnosis not present

## 2020-09-20 DIAGNOSIS — K219 Gastro-esophageal reflux disease without esophagitis: Secondary | ICD-10-CM | POA: Diagnosis present

## 2020-09-20 DIAGNOSIS — Z79899 Other long term (current) drug therapy: Secondary | ICD-10-CM | POA: Diagnosis not present

## 2020-09-20 DIAGNOSIS — Z800 Family history of malignant neoplasm of digestive organs: Secondary | ICD-10-CM | POA: Diagnosis not present

## 2020-09-20 DIAGNOSIS — K8031 Calculus of bile duct with cholangitis, unspecified, with obstruction: Secondary | ICD-10-CM | POA: Diagnosis present

## 2020-09-20 DIAGNOSIS — R590 Localized enlarged lymph nodes: Secondary | ICD-10-CM | POA: Diagnosis not present

## 2020-09-20 DIAGNOSIS — K820 Obstruction of gallbladder: Secondary | ICD-10-CM | POA: Diagnosis not present

## 2020-09-20 DIAGNOSIS — Z880 Allergy status to penicillin: Secondary | ICD-10-CM | POA: Diagnosis not present

## 2020-09-20 LAB — HEPATITIS PANEL, ACUTE
HCV Ab: NONREACTIVE
Hep A IgM: NONREACTIVE
Hep B C IgM: NONREACTIVE
Hepatitis B Surface Ag: NONREACTIVE

## 2020-09-20 LAB — HEPATIC FUNCTION PANEL
ALT: 356 U/L — ABNORMAL HIGH (ref 0–44)
AST: 271 U/L — ABNORMAL HIGH (ref 15–41)
Albumin: 3.3 g/dL — ABNORMAL LOW (ref 3.5–5.0)
Alkaline Phosphatase: 323 U/L — ABNORMAL HIGH (ref 38–126)
Bilirubin, Direct: 4.6 mg/dL — ABNORMAL HIGH (ref 0.0–0.2)
Indirect Bilirubin: 1.7 mg/dL — ABNORMAL HIGH (ref 0.3–0.9)
Total Bilirubin: 6.3 mg/dL — ABNORMAL HIGH (ref 0.3–1.2)
Total Protein: 6.4 g/dL — ABNORMAL LOW (ref 6.5–8.1)

## 2020-09-20 LAB — PROTIME-INR
INR: 1.2 (ref 0.8–1.2)
Prothrombin Time: 15.3 seconds — ABNORMAL HIGH (ref 11.4–15.2)

## 2020-09-20 LAB — CBC WITH DIFFERENTIAL/PLATELET
Abs Immature Granulocytes: 0.11 10*3/uL — ABNORMAL HIGH (ref 0.00–0.07)
Basophils Absolute: 0.1 10*3/uL (ref 0.0–0.1)
Basophils Relative: 0 %
Eosinophils Absolute: 0 10*3/uL (ref 0.0–0.5)
Eosinophils Relative: 0 %
HCT: 42.3 % (ref 36.0–46.0)
Hemoglobin: 13.8 g/dL (ref 12.0–15.0)
Immature Granulocytes: 1 %
Lymphocytes Relative: 1 %
Lymphs Abs: 0.2 10*3/uL — ABNORMAL LOW (ref 0.7–4.0)
MCH: 29.8 pg (ref 26.0–34.0)
MCHC: 32.6 g/dL (ref 30.0–36.0)
MCV: 91.4 fL (ref 80.0–100.0)
Monocytes Absolute: 0.5 10*3/uL (ref 0.1–1.0)
Monocytes Relative: 2 %
Neutro Abs: 19 10*3/uL — ABNORMAL HIGH (ref 1.7–7.7)
Neutrophils Relative %: 96 %
Platelets: 204 10*3/uL (ref 150–400)
RBC: 4.63 MIL/uL (ref 3.87–5.11)
RDW: 13.4 % (ref 11.5–15.5)
WBC: 19.8 10*3/uL — ABNORMAL HIGH (ref 4.0–10.5)
nRBC: 0 % (ref 0.0–0.2)

## 2020-09-20 LAB — BASIC METABOLIC PANEL
Anion gap: 10 (ref 5–15)
BUN: 31 mg/dL — ABNORMAL HIGH (ref 8–23)
CO2: 23 mmol/L (ref 22–32)
Calcium: 9.3 mg/dL (ref 8.9–10.3)
Chloride: 108 mmol/L (ref 98–111)
Creatinine, Ser: 1.34 mg/dL — ABNORMAL HIGH (ref 0.44–1.00)
GFR, Estimated: 38 mL/min — ABNORMAL LOW (ref >60)
Glucose, Bld: 104 mg/dL — ABNORMAL HIGH (ref 70–99)
Potassium: 5.3 mmol/L — ABNORMAL HIGH (ref 3.5–5.1)
Sodium: 141 mmol/L (ref 135–145)

## 2020-09-20 LAB — ACETAMINOPHEN LEVEL: Acetaminophen (Tylenol), Serum: 22 ug/mL (ref 10–30)

## 2020-09-20 MED ORDER — ONDANSETRON HCL 4 MG/2ML IJ SOLN
4.0000 mg | Freq: Four times a day (QID) | INTRAMUSCULAR | Status: DC | PRN
  Administered 2020-09-20: 4 mg via INTRAVENOUS
  Filled 2020-09-20: qty 2

## 2020-09-20 MED ORDER — MORPHINE SULFATE (PF) 2 MG/ML IV SOLN
2.0000 mg | INTRAVENOUS | Status: DC | PRN
  Administered 2020-09-21 (×2): 2 mg via INTRAVENOUS
  Filled 2020-09-20 (×2): qty 1

## 2020-09-20 MED ORDER — METRONIDAZOLE 500 MG/100ML IV SOLN
500.0000 mg | Freq: Three times a day (TID) | INTRAVENOUS | Status: DC
  Administered 2020-09-20 – 2020-09-22 (×5): 500 mg via INTRAVENOUS
  Filled 2020-09-20 (×5): qty 100

## 2020-09-20 MED ORDER — OXYCODONE HCL 5 MG PO TABS
2.5000 mg | ORAL_TABLET | Freq: Once | ORAL | Status: AC
  Administered 2020-09-20: 2.5 mg via ORAL
  Filled 2020-09-20: qty 1

## 2020-09-20 MED ORDER — MORPHINE SULFATE (PF) 2 MG/ML IV SOLN: 1.0000 mg | INTRAVENOUS | Status: DC | PRN

## 2020-09-20 MED ORDER — MORPHINE SULFATE (PF) 2 MG/ML IV SOLN
0.5000 mg | INTRAVENOUS | Status: DC | PRN
  Administered 2020-09-20: 0.5 mg via INTRAVENOUS
  Filled 2020-09-20: qty 1

## 2020-09-20 MED ORDER — CIPROFLOXACIN IN D5W 400 MG/200ML IV SOLN
400.0000 mg | Freq: Two times a day (BID) | INTRAVENOUS | Status: DC
  Administered 2020-09-20 (×2): 400 mg via INTRAVENOUS
  Filled 2020-09-20 (×3): qty 200

## 2020-09-20 MED ORDER — DEXTROSE IN LACTATED RINGERS 5 % IV SOLN: INTRAVENOUS | Status: AC

## 2020-09-20 MED ORDER — ACETAMINOPHEN 500 MG PO TABS
1000.0000 mg | ORAL_TABLET | Freq: Once | ORAL | Status: AC
  Administered 2020-09-20: 1000 mg via ORAL
  Filled 2020-09-20: qty 2

## 2020-09-20 MED ORDER — GADOBUTROL 1 MMOL/ML IV SOLN
5.0000 mL | Freq: Once | INTRAVENOUS | Status: AC | PRN
  Administered 2020-09-20: 5 mL via INTRAVENOUS

## 2020-09-20 NOTE — H&P (View-Only) (Signed)
Consultation  Referring Provider:  [facility]/ Hal Hope  Primary Care Physician:  Dettinger, [name], MD Primary Gastroenterologist:  Dr.Danis  Reason for Consultation: Chest pain, abdominal pain, elevated LFTs  HPI: Ashley Peters is a 87 y.o. female, who was admitted last night through the emergency room /[facility] after she had presented with complaints of chest pain radiating all the way down into her abdomen and pelvis which has been present over the previous 2 to 3 days and persisting. CT angiography negative for cardiopulmonary findings, troponins negative. CT did show stable intra and extrahepatic ductal dilation, she is status post cholecystectomy normal-appearing liver On admit- Labs with WBC 17.9/hemoglobin 14/hematocrit 43.4 T bili 4.5/alk phos 370/AST 502/ALT 427, lipase within normal limits  Reviewing prior labs on 08/24/2018 2T bili 0.6/AST 51/ALT 47/alk phos 129  She has had evidence of intra and extra hepatic ductal dilation only since 2019.  Patient does have history of adenocarcinoma of the colon stage IIc diagnosed July 2018 at which time she underwent a right hemicolectomy, and was treated with Xeloda postoperatively.  She has been followed by Dr. Burr Medico She had follow-up colonoscopy done October 2019 per Dr. Rachael Darby with removal of 3 polyps, the largest was 8 mm.  Path consistent with 2 tubular adenomas and 1 hyperplastic polyp, also noted to have diffuse diverticulosis.  She is awaiting MRCP/MRI this a.m.  Patient says that she feels about the same today, she has not had any fever or chills, she has been nauseated and did have some vomiting yesterday, she is continuing to complain of pain from the lower chest down into the lower abdomen and is having pain in her low back.  Pain medication is helping quite a bit but the pain is still present when the pain medication wears off.  She has not started any recent new medications, has not had any similar previous episodes of  pain   Past Medical History:  Diagnosis Date   Anemia    Blood transfusion without reported diagnosis    Cancer ([place])    Colon cancer ([place]) dx'd 10/2016   Diverticulosis    Family history of breast cancer    Family history of colon cancer    Family history of uterine cancer    GERD (gastroesophageal reflux disease)    History of kidney stones    Hyperlipidemia    Hypertension    Macular degeneration    Osteopenia     Past Surgical History:  Procedure Laterality Date   ABDOMINAL HYSTERECTOMY     ablation tr Great saphenous vein  10/17/2009   BREAST EXCISIONAL BIOPSY Bilateral 1971   brest biopsy  1971   CHOLECYSTECTOMY     EPIGASTRIC HERNIA REPAIR N/A 10/14/2016   Procedure: REPAIR OF EPIGASTRIC VENTRAL HERNIA;  Surgeon: Donnie Mesa, MD;  Location: [facility];  Service: General;  Laterality: N/A;   lt. distal ureteral stone extraction  09/14/1991   PARTIAL COLECTOMY Right 10/14/2016   Procedure: OPEN RIGHT HEMICOLECTOMY;  Surgeon: Donnie Mesa, MD;  Location: [facility];  Service: General;  Laterality: Right;    Prior to Admission medications   Medication Sig Start Date End Date Taking? Authorizing Provider  acetaminophen (TYLENOL) 500 MG tablet Take 2 tablets (1,000 mg total) by mouth every 6 (six) hours as needed for moderate pain. 10/17/16   Donnie Mesa, MD  alendronate (FOSAMAX) 70 MG tablet Take with a full glass of water on an empty stomach. 02/22/20   Dettinger, [name], MD  cholecalciferol (  VITAMIN D) 1000 units tablet Take 1,000 Units by mouth daily.    [provider]  Multiple Vitamins-Minerals (PRESERVISION AREDS 2 PO) Take 1 capsule by mouth 2 (two) times daily.    [provider]  predniSONE (DELTASONE) 5 MG tablet Take by mouth. Take 6-5-4-3-2-1 by mouth every day 09/07/20   [provider]  tiZANidine (ZANAFLEX) 4 MG tablet Take 4 mg by mouth every 8 (eight) hours as needed for muscle spasms or muscle pain. 09/07/20   [provider]     Current Facility-Administered Medications  Medication Dose Route Frequency Provider Last Rate Last Admin   dextrose 5 % in lactated ringers infusion   Intravenous Continuous Rise Patience, MD       morphine 2 MG/ML injection 0.5 mg  0.5 mg Intravenous Q3H PRN Rise Patience, MD        Allergies as of 09/19/2020 - Review Complete 09/19/2020  Allergen Reaction Noted   Benicar [olmesartan medoxomil]  07/15/2010   Amoxicillin Rash 07/15/2010    Family History  Problem Relation Age of Onset   Stroke Father 9   Breast cancer Sister        dx 30's, died at 35   Seizures Son    Diabetes Brother    Kidney disease Brother    Heart disease Brother    Epilepsy Son    Colon cancer Other 57   Uterine cancer Other 7   Hodgkin's lymphoma Other    Breast cancer Other     Social History   Socioeconomic History   Marital status: Widowed    Spouse name: Not on file   Number of children: 3   Years of education: 43   Highest education level: Some college, no degree  Occupational History   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  Tobacco Use   Smoking status: Never   Smokeless tobacco: Never  Vaping Use   Vaping Use: Never used  Substance and Sexual Activity   Alcohol use: No   Drug use: No   Sexual activity: Not Currently    Birth control/protection: None  Other Topics Concern   Not on file  Social History Narrative   Not on file   Social Determinants of Health   Financial Resource Strain: Low Risk    Difficulty of Paying Living Expenses: Not hard at all  Food Insecurity: No Food Insecurity   Worried About Charity fundraiser in the Last Year: Never true   [place] in the Last Year: Never true  Transportation Needs: No Transportation Needs   Lack of Transportation (Medical): No   Lack of Transportation (Non-Medical): No  Physical Activity: Insufficiently Active   Days of Exercise per Week: 7 days   Minutes of Exercise per Session: 10 min  Stress:  No Stress Concern Present   Feeling of Stress : Only a little  Social Connections: Moderately Integrated   Frequency of Communication with Friends and Family: More than three times a week   Frequency of Social Gatherings with Friends and Family: More than three times a week   Attends Religious Services: More than 4 times per year   Active Member of [employer] or Organizations: Yes   Attends Archivist Meetings: 1 to 4 times per year   Marital Status: Widowed  Intimate Partner Violence: Not on file    Review of Systems: Pertinent positive and negative review of systems were noted in the above HPI section.  All other review of systems was otherwise negative.   Physical Exam: Vital signs in last 24 hours: Temp:  [97.8 F (36.6 C)-98.6 F (37 C)] 98.6 F (37 C) (07/14 0900) Pulse Rate:  [76-105] 86 (07/14 0900) Resp:  [15-27] 16 (07/14 0900) BP: (99-127)/(50-70) 101/58 (07/14 0900) SpO2:  [92 %-99 %] 96 % (07/14 0900) Weight:  [54.4 kg] 54.4 kg (07/13 1841) Last BM Date: 09/19/20 General:   Alert,  Well-developed, well-nourished, elderly white female pleasant and cooperative in NAD-daughter at bedside Head:  Normocephalic and atraumatic. Eyes:  Sclera with Early icterus conjunctiva pink. Ears:  Normal auditory acuity. Nose:  No deformity, discharge,  or lesions. Mouth:  No deformity or lesions.   Neck:  Supple; no masses or thyromegaly. Lungs:  Clear throughout to auscultation.   No wheezes, crackles, or rhonchi. Heart:  Regular rate and rhythm; no murmurs, clicks, rubs,  or gallops. Abdomen:  Soft, mild rather generalized abdominal tenderness, there is some tenderness in the epigastrium and hypogastrium ,BS active,nonpalp mass or hsm.  Midline incisional scar and cholecystectomy scar Rectal:  Deferred  Msk:  Symmetrical without gross deformities. . Pulses:  Normal pulses noted. Extremities:  Without clubbing or edema. Neurologic:  Alert and  oriented x4;  grossly normal  neurologically. Skin:  Intact without significant lesions or rashes.. Psych:  Alert and cooperative. Normal mood and affect.  Intake/Output from previous day: No intake/output data recorded. Intake/Output this shift: No intake/output data recorded.  Lab Results: Recent Labs    09/19/20 1845  WBC 17.9*  HGB 14.0  HCT 43.4  PLT 205   BMET Recent Labs    09/19/20 1845  NA 140  K 4.1  CL 104  CO2 25  GLUCOSE 168*  BUN 19  CREATININE 1.06*  CALCIUM 9.7   LFT Recent Labs    09/19/20 1911  PROT 6.4*  ALBUMIN 3.8  AST 502*  ALT 427*  ALKPHOS 370*  BILITOT 4.5*  BILIDIR 2.6*  IBILI 1.9*   PT/INR No results for input(s): LABPROT, INR in the last 72 hours. Hepatitis Panel No results for input(s): HEPBSAG, HCVAB, HEPAIGM, HEPBIGM in the last 72 hours.   IMPRESSION:  87 year old white female with history of stage II adenocarcinoma of the colon diagnosed 2018 status post right hemicolectomy, presenting with acute lower chest and rather diffuse abdominal pain over the past 2 days associated with nausea, decrease in appetite. CT angiography/initial cardiac work-up unremarkable She does have a chronic intra and extrahepatic ductal dilation which was seen on CT angio last night, this dates back at least through 2019 she is status post very remote cholecystectomy  Patient has leukocytosis and elevated LFTs, rule out cholangitis, choledocholithiasis, malignancy/stricture.  #2 history of adenomatous colon polyps #3 diverticulosis #4 ventral hernias  Plan; clear liquid diet MRI/MRCP Cover with IV antibiotics, patient lists amoxicillin allergy -Cipro/ Flagyl for now. Further recommendations pending results of MRI/MRCP, will follow-up this afternoon.    Arisbel Maione  PA-C7/14/2022, 10:45 AM

## 2020-09-20 NOTE — Consult Note (Signed)
Consultation  Referring Provider:  [facility]/ Hal Hope  Primary Care Physician:  Dettinger, [name], MD Primary Gastroenterologist:  Dr.Danis  Reason for Consultation: Chest pain, abdominal pain, elevated LFTs  HPI: Ashley Peters is a 87 y.o. female, who was admitted last night through the emergency room /[facility] after she had presented with complaints of chest pain radiating all the way down into her abdomen and pelvis which has been present over the previous 2 to 3 days and persisting. CT angiography negative for cardiopulmonary findings, troponins negative. CT did show stable intra and extrahepatic ductal dilation, she is status post cholecystectomy normal-appearing liver On admit- Labs with WBC 17.9/hemoglobin 14/hematocrit 43.4 T bili 4.5/alk phos 370/AST 502/ALT 427, lipase within normal limits  Reviewing prior labs on 08/24/2018 2T bili 0.6/AST 51/ALT 47/alk phos 129  She has had evidence of intra and extra hepatic ductal dilation only since 2019.  Patient does have history of adenocarcinoma of the colon stage IIc diagnosed July 2018 at which time she underwent a right hemicolectomy, and was treated with Xeloda postoperatively.  She has been followed by Dr. Burr Medico She had follow-up colonoscopy done October 2019 per Dr. Rachael Darby with removal of 3 polyps, the largest was 8 mm.  Path consistent with 2 tubular adenomas and 1 hyperplastic polyp, also noted to have diffuse diverticulosis.  She is awaiting MRCP/MRI this a.m.  Patient says that she feels about the same today, she has not had any fever or chills, she has been nauseated and did have some vomiting yesterday, she is continuing to complain of pain from the lower chest down into the lower abdomen and is having pain in her low back.  Pain medication is helping quite a bit but the pain is still present when the pain medication wears off.  She has not started any recent new medications, has not had any similar previous episodes of  pain   Past Medical History:  Diagnosis Date   Anemia    Blood transfusion without reported diagnosis    Cancer ([place])    Colon cancer ([place]) dx'd 10/2016   Diverticulosis    Family history of breast cancer    Family history of colon cancer    Family history of uterine cancer    GERD (gastroesophageal reflux disease)    History of kidney stones    Hyperlipidemia    Hypertension    Macular degeneration    Osteopenia     Past Surgical History:  Procedure Laterality Date   ABDOMINAL HYSTERECTOMY     ablation tr Great saphenous vein  10/17/2009   BREAST EXCISIONAL BIOPSY Bilateral 1971   brest biopsy  1971   CHOLECYSTECTOMY     EPIGASTRIC HERNIA REPAIR N/A 10/14/2016   Procedure: REPAIR OF EPIGASTRIC VENTRAL HERNIA;  Surgeon: Donnie Mesa, MD;  Location: [facility];  Service: General;  Laterality: N/A;   lt. distal ureteral stone extraction  09/14/1991   PARTIAL COLECTOMY Right 10/14/2016   Procedure: OPEN RIGHT HEMICOLECTOMY;  Surgeon: Donnie Mesa, MD;  Location: [facility];  Service: General;  Laterality: Right;    Prior to Admission medications   Medication Sig Start Date End Date Taking? Authorizing Provider  acetaminophen (TYLENOL) 500 MG tablet Take 2 tablets (1,000 mg total) by mouth every 6 (six) hours as needed for moderate pain. 10/17/16   Donnie Mesa, MD  alendronate (FOSAMAX) 70 MG tablet Take with a full glass of water on an empty stomach. 02/22/20   Dettinger, [name], MD  cholecalciferol (  VITAMIN D) 1000 units tablet Take 1,000 Units by mouth daily.    [provider]  Multiple Vitamins-Minerals (PRESERVISION AREDS 2 PO) Take 1 capsule by mouth 2 (two) times daily.    [provider]  predniSONE (DELTASONE) 5 MG tablet Take by mouth. Take 6-5-4-3-2-1 by mouth every day 09/07/20   [provider]  tiZANidine (ZANAFLEX) 4 MG tablet Take 4 mg by mouth every 8 (eight) hours as needed for muscle spasms or muscle pain. 09/07/20   [provider]     Current Facility-Administered Medications  Medication Dose Route Frequency Provider Last Rate Last Admin   dextrose 5 % in lactated ringers infusion   Intravenous Continuous Rise Patience, MD       morphine 2 MG/ML injection 0.5 mg  0.5 mg Intravenous Q3H PRN Rise Patience, MD        Allergies as of 09/19/2020 - Review Complete 09/19/2020  Allergen Reaction Noted   Benicar [olmesartan medoxomil]  07/15/2010   Amoxicillin Rash 07/15/2010    Family History  Problem Relation Age of Onset   Stroke Father 17   Breast cancer Sister        dx 72's, died at 3   Seizures Son    Diabetes Brother    Kidney disease Brother    Heart disease Brother    Epilepsy Son    Colon cancer Other 57   Uterine cancer Other 66   Hodgkin's lymphoma Other    Breast cancer Other     Social History   Socioeconomic History   Marital status: Widowed    Spouse name: Not on file   Number of children: 3   Years of education: 84   Highest education level: Some college, no degree  Occupational History   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  Tobacco Use   Smoking status: Never   Smokeless tobacco: Never  Vaping Use   Vaping Use: Never used  Substance and Sexual Activity   Alcohol use: No   Drug use: No   Sexual activity: Not Currently    Birth control/protection: None  Other Topics Concern   Not on file  Social History Narrative   Not on file   Social Determinants of Health   Financial Resource Strain: Low Risk    Difficulty of Paying Living Expenses: Not hard at all  Food Insecurity: No Food Insecurity   Worried About Charity fundraiser in the Last Year: Never true   [place] in the Last Year: Never true  Transportation Needs: No Transportation Needs   Lack of Transportation (Medical): No   Lack of Transportation (Non-Medical): No  Physical Activity: Insufficiently Active   Days of Exercise per Week: 7 days   Minutes of Exercise per Session: 10 min  Stress:  No Stress Concern Present   Feeling of Stress : Only a little  Social Connections: Moderately Integrated   Frequency of Communication with Friends and Family: More than three times a week   Frequency of Social Gatherings with Friends and Family: More than three times a week   Attends Religious Services: More than 4 times per year   Active Member of [employer] or Organizations: Yes   Attends Archivist Meetings: 1 to 4 times per year   Marital Status: Widowed  Intimate Partner Violence: Not on file    Review of Systems: Pertinent positive and negative review of systems were noted in the above HPI section.  All other review of systems was otherwise negative.   Physical Exam: Vital signs in last 24 hours: Temp:  [97.8 F (36.6 C)-98.6 F (37 C)] 98.6 F (37 C) (07/14 0900) Pulse Rate:  [76-105] 86 (07/14 0900) Resp:  [15-27] 16 (07/14 0900) BP: (99-127)/(50-70) 101/58 (07/14 0900) SpO2:  [92 %-99 %] 96 % (07/14 0900) Weight:  [54.4 kg] 54.4 kg (07/13 1841) Last BM Date: 09/19/20 General:   Alert,  Well-developed, well-nourished, elderly white female pleasant and cooperative in NAD-daughter at bedside Head:  Normocephalic and atraumatic. Eyes:  Sclera with Early icterus conjunctiva pink. Ears:  Normal auditory acuity. Nose:  No deformity, discharge,  or lesions. Mouth:  No deformity or lesions.   Neck:  Supple; no masses or thyromegaly. Lungs:  Clear throughout to auscultation.   No wheezes, crackles, or rhonchi. Heart:  Regular rate and rhythm; no murmurs, clicks, rubs,  or gallops. Abdomen:  Soft, mild rather generalized abdominal tenderness, there is some tenderness in the epigastrium and hypogastrium ,BS active,nonpalp mass or hsm.  Midline incisional scar and cholecystectomy scar Rectal:  Deferred  Msk:  Symmetrical without gross deformities. . Pulses:  Normal pulses noted. Extremities:  Without clubbing or edema. Neurologic:  Alert and  oriented x4;  grossly normal  neurologically. Skin:  Intact without significant lesions or rashes.. Psych:  Alert and cooperative. Normal mood and affect.  Intake/Output from previous day: No intake/output data recorded. Intake/Output this shift: No intake/output data recorded.  Lab Results: Recent Labs    09/19/20 1845  WBC 17.9*  HGB 14.0  HCT 43.4  PLT 205   BMET Recent Labs    09/19/20 1845  NA 140  K 4.1  CL 104  CO2 25  GLUCOSE 168*  BUN 19  CREATININE 1.06*  CALCIUM 9.7   LFT Recent Labs    09/19/20 1911  PROT 6.4*  ALBUMIN 3.8  AST 502*  ALT 427*  ALKPHOS 370*  BILITOT 4.5*  BILIDIR 2.6*  IBILI 1.9*   PT/INR No results for input(s): LABPROT, INR in the last 72 hours. Hepatitis Panel No results for input(s): HEPBSAG, HCVAB, HEPAIGM, HEPBIGM in the last 72 hours.   IMPRESSION:  87 year old white female with history of stage II adenocarcinoma of the colon diagnosed 2018 status post right hemicolectomy, presenting with acute lower chest and rather diffuse abdominal pain over the past 2 days associated with nausea, decrease in appetite. CT angiography/initial cardiac work-up unremarkable She does have a chronic intra and extrahepatic ductal dilation which was seen on CT angio last night, this dates back at least through 2019 she is status post very remote cholecystectomy  Patient has leukocytosis and elevated LFTs, rule out cholangitis, choledocholithiasis, malignancy/stricture.  #2 history of adenomatous colon polyps #3 diverticulosis #4 ventral hernias  Plan; clear liquid diet MRI/MRCP Cover with IV antibiotics, patient lists amoxicillin allergy -Cipro/ Flagyl for now. Further recommendations pending results of MRI/MRCP, will follow-up this afternoon.    Zyaire Mccleod  PA-C7/14/2022, 10:45 AM

## 2020-09-20 NOTE — Plan of Care (Signed)
?  Problem: Activity: ?Goal: Risk for activity intolerance will decrease ?Outcome: Progressing ?  ?Problem: Safety: ?Goal: Ability to remain free from injury will improve ?Outcome: Progressing ?  ?Problem: Pain Managment: ?Goal: General experience of comfort will improve ?Outcome: Progressing ?  ?

## 2020-09-20 NOTE — H&P (Signed)
History and Physical    MERCADES BAJAJ Peters:[id number] DOB: [birth year] DOA: 09/19/2020  PCP: Dettinger, [name], MD  Patient coming from: Home.  Chief Complaint: Epigastric pain.  HPI: Ashley Peters is a 87 y.o. female with history of colon cancer status post colectomy in remission presents to the ER with complaints of epigastric pain.  Patient has been having epigastric pain for the last 3 days burning in sensation nonradiating has not been eating well because of the fear of pain.  Denies any associated diarrhea fever chills or vomiting.  About 2 weeks ago patient had low back pain had gone to the urgent care where patient was given prednisone dose for 6 days along with muscle relaxant.  Patient completed a course of prednisone but did not take the muscle relaxant as it not made feel good.  Patient also was taking some Tylenol for low back pain.  Low back pain has improved considerably but still persist.  Had followed up with her PCP in the meantime last week.  ED Course: In the ER patient had CT angiogram of the chest abdomen pelvis which shows stable intra and extrahepatic biliary ductal dilatation.  Otherwise unremarkable.  Labs show normal troponins and unremarkable EKG.  COVID test was negative.  Patient's LFTs show alkaline phosphatase of 370 with total bilirubin of 4.5 direct being 2.6.  AST was 402 ALT 427.  WBC count is 17.9 patient is afebrile.  ER physician discussed with on-call [facility] gastroenterologist Dr. Fuller Plan will advise getting MRCP.  Patient admitted for further work-up.  Review of Systems: As per HPI, rest all negative.   Past Medical History:  Diagnosis Date   Anemia    Blood transfusion without reported diagnosis    Cancer ([place])    Colon cancer ([place]) dx'd 10/2016   Diverticulosis    Family history of breast cancer    Family history of colon cancer    Family history of uterine cancer    GERD (gastroesophageal reflux disease)    History of kidney stones     Hyperlipidemia    Hypertension    Macular degeneration    Osteopenia     Past Surgical History:  Procedure Laterality Date   ABDOMINAL HYSTERECTOMY     ablation tr Great saphenous vein  10/17/2009   BREAST EXCISIONAL BIOPSY Bilateral 1971   brest biopsy  1971   CHOLECYSTECTOMY     EPIGASTRIC HERNIA REPAIR N/A 10/14/2016   Procedure: REPAIR OF EPIGASTRIC VENTRAL HERNIA;  Surgeon: Donnie Mesa, MD;  Location: [facility];  Service: General;  Laterality: N/A;   lt. distal ureteral stone extraction  09/14/1991   PARTIAL COLECTOMY Right 10/14/2016   Procedure: OPEN RIGHT HEMICOLECTOMY;  Surgeon: Donnie Mesa, MD;  Location: [facility];  Service: General;  Laterality: Right;     reports that she has never smoked. She has never used smokeless tobacco. She reports that she does not drink alcohol and does not use drugs.  Allergies  Allergen Reactions   Benicar [Olmesartan Medoxomil]     Elevated Potassium   Amoxicillin Rash    Has patient had a PCN reaction causing immediate rash, facial/tongue/throat swelling, SOB or lightheadedness with hypotension: Yes Has patient had a PCN reaction causing severe rash involving mucus membranes or skin necrosis: No Has patient had a PCN reaction that required hospitalization: No Has patient had a PCN reaction occurring within the last 10 years: No If all of the above answers are "NO", then may proceed with Cephalosporin  use.    Family History  Problem Relation Age of Onset   Stroke Father 81   Breast cancer Sister        dx 54's, died at 85   Seizures Son    Diabetes Brother    Kidney disease Brother    Heart disease Brother    Epilepsy Son    Colon cancer Other 47   Uterine cancer Other 26   Hodgkin's lymphoma Other    Breast cancer Other     Prior to Admission medications   Medication Sig Start Date End Date Taking? Authorizing Provider  acetaminophen (TYLENOL) 500 MG tablet Take 2 tablets (1,000 mg total) by mouth every 6 (six) hours as needed  for moderate pain. 10/17/16   Donnie Mesa, MD  alendronate (FOSAMAX) 70 MG tablet Take with a full glass of water on an empty stomach. 02/22/20   Dettinger, [name], MD  cholecalciferol (VITAMIN D) 1000 units tablet Take 1,000 Units by mouth daily.    [provider]  Multiple Vitamins-Minerals (PRESERVISION AREDS 2 PO) Take 1 capsule by mouth 2 (two) times daily.    [provider]  predniSONE (DELTASONE) 5 MG tablet Take by mouth. Take 6-5-4-3-2-1 by mouth every day 09/07/20   [provider]  tiZANidine (ZANAFLEX) 4 MG tablet Take 4 mg by mouth every 8 (eight) hours as needed for muscle spasms or muscle pain. 09/07/20   [provider]    Physical Exam: Constitutional: Moderately built and nourished. Vitals:   09/20/20 0658 09/20/20 0700 09/20/20 0754 09/20/20 0900  BP: (!) 108/59 99/70 107/62 (!) 101/58  Pulse: 93 92 (!) 105 86  Resp: 18 20 17 16   Temp:    98.6 F (37 C)  TempSrc:    Oral  SpO2: 97% 98% 93% 96%  Weight:      Height:       Eyes: Anicteric no pallor. ENMT: No discharge from the ears eyes nose and mouth. Neck: No mass felt.  No neck rigidity. Respiratory: No rhonchi or crepitations. Cardiovascular: S1-S2 heard. Abdomen: Soft nontender bowel sound present. Musculoskeletal: No edema. Skin: No rash. Neurologic: Alert awake oriented to time place and person.  Moves all extremities. Psychiatric: Appears normal.  Normal affect.   Labs on Admission: I have personally reviewed following labs and imaging studies  CBC: Recent Labs  Lab 09/19/20 1845  WBC 17.9*  HGB 14.0  HCT 43.4  MCV 90.8  PLT 861   Basic Metabolic Panel: Recent Labs  Lab 09/19/20 1845  NA 140  K 4.1  CL 104  CO2 25  GLUCOSE 168*  BUN 19  CREATININE 1.06*  CALCIUM 9.7   GFR: Estimated Creatinine Clearance: 25.3 mL/min (A) (by C-G formula based on SCr of 1.06 mg/dL (H)). Liver Function Tests: Recent Labs  Lab 09/19/20 1911  AST 502*  ALT 427*   ALKPHOS 370*  BILITOT 4.5*  PROT 6.4*  ALBUMIN 3.8   Recent Labs  Lab 09/19/20 1911  LIPASE 23   No results for input(s): AMMONIA in the last 168 hours. Coagulation Profile: No results for input(s): INR, PROTIME in the last 168 hours. Cardiac Enzymes: No results for input(s): CKTOTAL, CKMB, CKMBINDEX, TROPONINI in the last 168 hours. BNP (last 3 results) No results for input(s): PROBNP in the last 8760 hours. HbA1C: No results for input(s): HGBA1C in the last 72 hours. CBG: No results for input(s): GLUCAP in the last 168 hours. Lipid Profile: No results for input(s): CHOL, HDL,  LDLCALC, TRIG, CHOLHDL, LDLDIRECT in the last 72 hours. Thyroid Function Tests: No results for input(s): TSH, T4TOTAL, FREET4, T3FREE, THYROIDAB in the last 72 hours. Anemia Panel: No results for input(s): VITAMINB12, FOLATE, FERRITIN, TIBC, IRON, RETICCTPCT in the last 72 hours. Urine analysis:    Component Value Date/Time   BILIRUBINUR neg 01/03/2013 1108   PROTEINUR 100 01/03/2013 1108   UROBILINOGEN negative 01/03/2013 1108   NITRITE neg 01/03/2013 1108   LEUKOCYTESUR moderate (2+) 01/03/2013 1108   Sepsis Labs: @LABRCNTIP (procalcitonin:4,lacticidven:4) ) Recent Results (from the past 240 hour(s))  Resp Panel by RT-PCR (Flu A&B, Covid) Nasopharyngeal Swab     Status: None   Collection Time: 09/19/20 10:10 PM   Specimen: Nasopharyngeal Swab; Nasopharyngeal(NP) swabs in vial transport medium  Result Value Ref Range Status   SARS Coronavirus 2 by RT PCR NEGATIVE NEGATIVE Final    Comment: (NOTE) SARS-CoV-2 target nucleic acids are NOT DETECTED.  The SARS-CoV-2 RNA is generally detectable in upper respiratory specimens during the acute phase of infection. The lowest concentration of SARS-CoV-2 viral copies this assay can detect is 138 copies/mL. A negative result does not preclude SARS-Cov-2 infection and should not be used as the sole basis for treatment or other patient management  decisions. A negative result may occur with  improper specimen collection/handling, submission of specimen other than nasopharyngeal swab, presence of viral mutation(s) within the areas targeted by this assay, and inadequate number of viral copies(<138 copies/mL). A negative result must be combined with clinical observations, patient history, and epidemiological information. The expected result is Negative.  Fact Sheet for Patients:  EntrepreneurPulse.com.au  Fact Sheet for Healthcare Providers:  IncredibleEmployment.be  This test is no t yet approved or cleared by the [place] FDA and  has been authorized for detection and/or diagnosis of SARS-CoV-2 by FDA under an Emergency Use Authorization (EUA). This EUA will remain  in effect (meaning this test can be used) for the duration of the COVID-19 declaration under Section 564(b)(1) of the Act, 21 U.S.C.section 360bbb-3(b)(1), unless the authorization is terminated  or revoked sooner.       Influenza A by PCR NEGATIVE NEGATIVE Final   Influenza B by PCR NEGATIVE NEGATIVE Final    Comment: (NOTE) The Xpert Xpress SARS-CoV-2/FLU/RSV plus assay is intended as an aid in the diagnosis of influenza from Nasopharyngeal swab specimens and should not be used as a sole basis for treatment. Nasal washings and aspirates are unacceptable for Xpert Xpress SARS-CoV-2/FLU/RSV testing.  Fact Sheet for Patients: EntrepreneurPulse.com.au  Fact Sheet for Healthcare Providers: IncredibleEmployment.be  This test is not yet approved or cleared by the [place] FDA and has been authorized for detection and/or diagnosis of SARS-CoV-2 by FDA under an Emergency Use Authorization (EUA). This EUA will remain in effect (meaning this test can be used) for the duration of the COVID-19 declaration under Section 564(b)(1) of the Act, 21 U.S.C. section 360bbb-3(b)(1), unless the  authorization is terminated or revoked.  Performed at [facility], [address] [place], [place] [address]      Radiological Exams on Admission: DG Chest 2 View  Result Date: 09/19/2020 CLINICAL DATA:  Chest pain for 2 days EXAM: CHEST - 2 VIEW COMPARISON:  Chest CT 08/25/2019 FINDINGS: The cardiac silhouette, mediastinal and hilar contours are normal. Streaky left basilar scarring changes. No infiltrates, edema or effusions. No pulmonary lesions. The bony thorax is intact. IMPRESSION: Streaky left basilar scarring changes.  No acute pulmonary findings. Electronically Signed   By: Mamie Nick.  Gallerani M.D.   On: 09/19/2020 19:29   CT Angio Chest/Abd/Pel for Dissection W and/or Wo Contrast  Result Date: 09/19/2020 CLINICAL DATA:  Chest and abdominal pain. EXAM: CT ANGIOGRAPHY CHEST, ABDOMEN AND PELVIS TECHNIQUE: Multidetector CT imaging through the chest, abdomen and pelvis was performed using the standard protocol during bolus administration of intravenous contrast. Multiplanar reconstructed images and MIPs were obtained and reviewed to evaluate the vascular anatomy. CONTRAST:  19mL OMNIPAQUE IOHEXOL 350 MG/ML SOLN COMPARISON:  CT scan 08/24/2018 FINDINGS: CTA CHEST FINDINGS Cardiovascular: The heart is normal in size. No pericardial effusion. The aorta is normal in caliber. There is mild tortuosity but no dissection. Remarkably little atherosclerotic calcifications for the patient's age. Minimal coronary artery calcifications. The pulmonary arteries are normal. No filling defects to suggest pulmonary embolism. Mediastinum/Nodes: No mediastinal or hilar mass or adenopathy. The esophagus is grossly normal. There is a small hiatal hernia noted. Lungs/Pleura: No acute pulmonary findings. No worrisome pulmonary lesions. No pleural effusions or pleural nodules. Musculoskeletal: No significant bony findings. No breast masses, supraclavicular or axillary adenopathy. Review of the MIP images  confirms the above findings. CTA ABDOMEN AND PELVIS FINDINGS VASCULAR Aorta: Normal caliber. Minimal scattered atherosclerotic calcifications for age. Celiac: Patent SMA: Patent Renals: Patent IMA: Patent Inflow: Patent Veins: Unremarkable Review of the MIP images confirms the above findings. NON-VASCULAR Hepatobiliary: Stable intra and extrahepatic biliary dilatation. No hepatic lesions are identified. Pancreas: No mass, inflammation or ductal dilatation. Spleen: Normal size.  No focal lesions. Adrenals/Urinary Tract: The adrenal glands are unremarkable and stable. Stable renal cysts. No worrisome renal lesions. The bladder is unremarkable. Stomach/Bowel: The stomach, duodenum, small and colon are grossly normal. No acute inflammatory process, mass lesion or obstructive findings. There is significant diffuse colonic diverticulosis and remote surgical changes involving the right colon but no findings to suggest acute diverticulitis. Lymphatic: No mesenteric or retroperitoneal or pelvic adenopathy. Reproductive: The uterus is surgically absent. Both ovaries are still present and appear normal. Other: No pelvic mass or adenopathy. No free pelvic fluid collections. No inguinal mass or adenopathy. Anterior abdominal wall hernia containing part of the transverse colon, unchanged since prior study. Musculoskeletal: No significant bony findings. Review of the MIP images confirms the above findings. IMPRESSION: 1. Normal thoracic and abdominal aorta. No aneurysm or dissection. 2. No CT findings for pulmonary embolism. 3. No acute pulmonary findings. 4. Stable intra and extrahepatic biliary dilatation. 5. Diffuse colonic diverticulosis without findings for acute diverticulitis. 6. Stable anterior abdominal wall hernia containing part of the transverse colon. Electronically Signed   By: Marijo Sanes M.D.   On: 09/19/2020 20:22    EKG: Independently reviewed.  Normal sinus rhythm.  Assessment/Plan Principal Problem:    Epigastric pain Active Problems:   History of colon cancer   B12 deficiency   Hepatitis   LFT elevation   Abnormal LFTs    Epigastric pain with abnormal LFTs -given that patient has elevated conjugated bilirubin with elevated alkaline phosphatase with elevated AST ALT primary concerning to rule out any obstructive masses for which MRCP has been ordered.  [facility] has been consulted.  In addition we will also repeat LFTs check acute hepatitis panel PT/INR Tylenol level for now keep patient on clear liquid.  Patient's lipase was normal. Dehydration -creatinine has slightly increased from 0.81 last month in August 24, 2030 and it is around 1.06.  We will gently hydrate and follow metabolic panel. History of colon cancer in remission.  Being followed by Dr. Burr Medico, oncologist. History  of B12 deficiency on monthly vitamin B12 injections.  Since patient has persistent epigastric pain with markedly elevated LFTs more than 10 times the normal with ongoing pain on eating will need further work-up and inpatient status.   DVT prophylaxis: SCDs.  Holding anticoagulation until we repeat LFTs and also pending MRI. Code Status: Full code. Family Communication: Patient's daughter. Disposition Plan: Home. Consults called: [facility]. Admission status: Inpatient.   Rise Patience MD [facility] Pager [phone].  If 7PM-7AM, please contact night-coverage [email or web address] Password [email or web address]  09/20/2020, 10:14 AM

## 2020-09-20 NOTE — Plan of Care (Signed)
  Problem: Clinical Measurements: Goal: Respiratory complications will improve Outcome: Progressing   Problem: Clinical Measurements: Goal: Cardiovascular complication will be avoided Outcome: Progressing   Problem: Coping: Goal: Level of anxiety will decrease Outcome: Progressing   Problem: Elimination: Goal: Will not experience complications related to bowel motility Outcome: Progressing   Problem: Elimination: Goal: Will not experience complications related to urinary retention Outcome: Progressing   Problem: Skin Integrity: Goal: Risk for impaired skin integrity will decrease Outcome: Progressing

## 2020-09-20 NOTE — ED Notes (Signed)
Report called to care link 

## 2020-09-20 NOTE — ED Notes (Signed)
Pt went to the bathroom ambulatory with assistance

## 2020-09-21 ENCOUNTER — Inpatient Hospital Stay (HOSPITAL_COMMUNITY): Payer: Medicare Other | Admitting: Certified Registered Nurse Anesthetist

## 2020-09-21 ENCOUNTER — Inpatient Hospital Stay (HOSPITAL_COMMUNITY): Payer: Medicare Other

## 2020-09-21 ENCOUNTER — Encounter (HOSPITAL_COMMUNITY): Payer: Self-pay | Admitting: Internal Medicine

## 2020-09-21 ENCOUNTER — Encounter (HOSPITAL_COMMUNITY)
Admission: EM | Disposition: A | Discharge status: Home / Self Care | Payer: Self-pay | Source: Home / Self Care | Attending: Family Medicine

## 2020-09-21 ENCOUNTER — Other Ambulatory Visit: Payer: Self-pay | Admitting: Internal Medicine

## 2020-09-21 DIAGNOSIS — K8051 Calculus of bile duct without cholangitis or cholecystitis with obstruction: Secondary | ICD-10-CM | POA: Diagnosis not present

## 2020-09-21 DIAGNOSIS — K8031 Calculus of bile duct with cholangitis, unspecified, with obstruction: Secondary | ICD-10-CM

## 2020-09-21 HISTORY — PX: STONE EXTRACTION WITH BASKET: SHX5318

## 2020-09-21 HISTORY — PX: SPHINCTEROTOMY: SHX5279

## 2020-09-21 HISTORY — PX: REMOVAL OF STONES: SHX5545

## 2020-09-21 HISTORY — PX: ERCP: SHX5425

## 2020-09-21 LAB — COMPREHENSIVE METABOLIC PANEL
ALT: 239 U/L — ABNORMAL HIGH (ref 0–44)
AST: 130 U/L — ABNORMAL HIGH (ref 15–41)
Albumin: 2.8 g/dL — ABNORMAL LOW (ref 3.5–5.0)
Alkaline Phosphatase: 265 U/L — ABNORMAL HIGH (ref 38–126)
Anion gap: 8 (ref 5–15)
BUN: 42 mg/dL — ABNORMAL HIGH (ref 8–23)
CO2: 26 mmol/L (ref 22–32)
Calcium: 8.3 mg/dL — ABNORMAL LOW (ref 8.9–10.3)
Chloride: 102 mmol/L (ref 98–111)
Creatinine, Ser: 1.63 mg/dL — ABNORMAL HIGH (ref 0.44–1.00)
GFR, Estimated: 30 mL/min — ABNORMAL LOW (ref >60)
Glucose, Bld: 115 mg/dL — ABNORMAL HIGH (ref 70–99)
Potassium: 4.7 mmol/L (ref 3.5–5.1)
Sodium: 136 mmol/L (ref 135–145)
Total Bilirubin: 4.3 mg/dL — ABNORMAL HIGH (ref 0.3–1.2)
Total Protein: 5.6 g/dL — ABNORMAL LOW (ref 6.5–8.1)

## 2020-09-21 LAB — CBC WITH DIFFERENTIAL/PLATELET
Abs Immature Granulocytes: 0.15 10*3/uL — ABNORMAL HIGH (ref 0.00–0.07)
Basophils Absolute: 0 10*3/uL (ref 0.0–0.1)
Basophils Relative: 0 %
Eosinophils Absolute: 0 10*3/uL (ref 0.0–0.5)
Eosinophils Relative: 0 %
HCT: 37.9 % (ref 36.0–46.0)
Hemoglobin: 12 g/dL (ref 12.0–15.0)
Immature Granulocytes: 1 %
Lymphocytes Relative: 2 %
Lymphs Abs: 0.2 10*3/uL — ABNORMAL LOW (ref 0.7–4.0)
MCH: 29.5 pg (ref 26.0–34.0)
MCHC: 31.7 g/dL (ref 30.0–36.0)
MCV: 93.1 fL (ref 80.0–100.0)
Monocytes Absolute: 0.4 10*3/uL (ref 0.1–1.0)
Monocytes Relative: 3 %
Neutro Abs: 13.7 10*3/uL — ABNORMAL HIGH (ref 1.7–7.7)
Neutrophils Relative %: 94 %
Platelets: 161 10*3/uL (ref 150–400)
RBC: 4.07 MIL/uL (ref 3.87–5.11)
RDW: 13.7 % (ref 11.5–15.5)
WBC: 14.5 10*3/uL — ABNORMAL HIGH (ref 4.0–10.5)
nRBC: 0 % (ref 0.0–0.2)

## 2020-09-21 LAB — CEA: CEA: 3.2 ng/mL (ref 0.0–4.7)

## 2020-09-21 LAB — AFP TUMOR MARKER: AFP, Serum, Tumor Marker: 1 ng/mL (ref 0.0–8.7)

## 2020-09-21 SURGERY — ERCP, WITH INTERVENTION IF INDICATED
Anesthesia: General

## 2020-09-21 MED ORDER — SODIUM CHLORIDE 0.9 % IV SOLN: INTRAVENOUS | Status: DC

## 2020-09-21 MED ORDER — FENTANYL CITRATE (PF) 100 MCG/2ML IJ SOLN
INTRAMUSCULAR | Status: AC
  Filled 2020-09-21: qty 2

## 2020-09-21 MED ORDER — EPINEPHRINE 1 MG/10ML IJ SOSY
PREFILLED_SYRINGE | INTRAMUSCULAR | Status: AC
  Filled 2020-09-21: qty 10

## 2020-09-21 MED ORDER — GLUCAGON HCL RDNA (DIAGNOSTIC) 1 MG IJ SOLR
INTRAMUSCULAR | Status: AC
  Filled 2020-09-21: qty 1

## 2020-09-21 MED ORDER — PROPOFOL 10 MG/ML IV BOLUS
INTRAVENOUS | Status: AC
  Filled 2020-09-21: qty 20

## 2020-09-21 MED ORDER — CIPROFLOXACIN IN D5W 400 MG/200ML IV SOLN
400.0000 mg | INTRAVENOUS | Status: DC
  Administered 2020-09-21: 400 mg via INTRAVENOUS
  Filled 2020-09-21: qty 200

## 2020-09-21 MED ORDER — PROPOFOL 10 MG/ML IV BOLUS
INTRAVENOUS | Status: DC | PRN
  Administered 2020-09-21: 100 mg via INTRAVENOUS

## 2020-09-21 MED ORDER — LIDOCAINE 2% (20 MG/ML) 5 ML SYRINGE
INTRAMUSCULAR | Status: DC | PRN
  Administered 2020-09-21: 60 mg via INTRAVENOUS

## 2020-09-21 MED ORDER — PHENYLEPHRINE HCL-NACL 10-0.9 MG/250ML-% IV SOLN
INTRAVENOUS | Status: DC | PRN
  Administered 2020-09-21: 100 ug/min via INTRAVENOUS

## 2020-09-21 MED ORDER — ROCURONIUM BROMIDE 10 MG/ML (PF) SYRINGE
PREFILLED_SYRINGE | INTRAVENOUS | Status: DC | PRN
  Administered 2020-09-21: 50 mg via INTRAVENOUS

## 2020-09-21 MED ORDER — SODIUM CHLORIDE 0.9 % IV SOLN
INTRAVENOUS | Status: DC | PRN
  Administered 2020-09-21: 45 mL

## 2020-09-21 MED ORDER — PHENYLEPHRINE HCL (PRESSORS) 10 MG/ML IV SOLN
INTRAVENOUS | Status: AC
  Filled 2020-09-21: qty 1

## 2020-09-21 MED ORDER — INDOMETHACIN 50 MG RE SUPP: 100.0000 mg | Freq: Once | RECTAL | Status: DC

## 2020-09-21 MED ORDER — INDOMETHACIN 50 MG RE SUPP
RECTAL | Status: DC | PRN
  Administered 2020-09-21: 100 mg via RECTAL

## 2020-09-21 MED ORDER — FENTANYL CITRATE (PF) 100 MCG/2ML IJ SOLN
INTRAMUSCULAR | Status: DC | PRN
  Administered 2020-09-21 (×2): 50 ug via INTRAVENOUS

## 2020-09-21 MED ORDER — ONDANSETRON HCL 4 MG/2ML IJ SOLN
INTRAMUSCULAR | Status: DC | PRN
  Administered 2020-09-21: 4 mg via INTRAVENOUS

## 2020-09-21 MED ORDER — CIPROFLOXACIN IN D5W 400 MG/200ML IV SOLN
INTRAVENOUS | Status: AC
  Filled 2020-09-21: qty 200

## 2020-09-21 MED ORDER — PHENYLEPHRINE 40 MCG/ML (10ML) SYRINGE FOR IV PUSH (FOR BLOOD PRESSURE SUPPORT)
PREFILLED_SYRINGE | INTRAVENOUS | Status: DC | PRN
  Administered 2020-09-21: 80 ug via INTRAVENOUS
  Administered 2020-09-21: 160 ug via INTRAVENOUS

## 2020-09-21 MED ORDER — LACTATED RINGERS IV SOLN: INTRAVENOUS | Status: DC | PRN

## 2020-09-21 MED ORDER — DEXAMETHASONE SODIUM PHOSPHATE 10 MG/ML IJ SOLN
INTRAMUSCULAR | Status: DC | PRN
  Administered 2020-09-21: 10 mg via INTRAVENOUS

## 2020-09-21 MED ORDER — INDOMETHACIN 50 MG RE SUPP
RECTAL | Status: AC
  Filled 2020-09-21: qty 2

## 2020-09-21 MED ORDER — SUGAMMADEX SODIUM 200 MG/2ML IV SOLN
INTRAVENOUS | Status: DC | PRN
  Administered 2020-09-21: 200 mg via INTRAVENOUS

## 2020-09-21 NOTE — Interval H&P Note (Signed)
History and Physical Interval Note:  09/21/2020 12:57 PM  Ashley Peters  has presented today for surgery, with the diagnosis of abdominal  pain, elevated LFT's, abnormal MRCP.  The various methods of treatment have been discussed with the patient and family. After consideration of risks, benefits and other options for treatment, the patient has consented to  Procedure(s): ENDOSCOPIC RETROGRADE CHOLANGIOPANCREATOGRAPHY (ERCP) (N/A) as a surgical intervention.  The patient's history has been reviewed, patient examined, no change in status, stable for surgery.  I have reviewed the patient's chart and labs.  Questions were answered to the patient's satisfaction.     Silvano Rusk

## 2020-09-21 NOTE — Anesthesia Preprocedure Evaluation (Addendum)
Anesthesia Evaluation  Patient identified by MRN, date of birth, ID band Patient awake    Reviewed: Allergy & Precautions, NPO status , Patient's Chart, lab work & pertinent test results  History of Anesthesia Complications Negative for: history of anesthetic complications  Airway Mallampati: III  TM Distance: <3 FB Neck ROM: Full    Dental no notable dental hx. (+) Edentulous Upper, Edentulous Lower   Pulmonary neg pulmonary ROS,    Pulmonary exam normal breath sounds clear to auscultation       Cardiovascular hypertension, Normal cardiovascular exam Rhythm:Regular Rate:Normal     Neuro/Psych negative neurological ROS  negative psych ROS   GI/Hepatic GERD  ,(+) Hepatitis - Hx colon cancer     Endo/Other  negative endocrine ROS  Renal/GU Renal InsufficiencyRenal disease     Musculoskeletal negative musculoskeletal ROS (+)   Abdominal   Peds  Hematology negative hematology ROS (+)   Anesthesia Other Findings   Reproductive/Obstetrics                            Anesthesia Physical Anesthesia Plan  ASA: 2  Anesthesia Plan: General   Post-op Pain Management:    Induction: Intravenous  PONV Risk Score and Plan: 3 and Treatment may vary due to age or medical condition and Ondansetron  Airway Management Planned: Oral ETT  Additional Equipment: None  Intra-op Plan:   Post-operative Plan: Extubation in OR  Informed Consent: I have reviewed the patients History and Physical, chart, labs and discussed the procedure including the risks, benefits and alternatives for the proposed anesthesia with the patient or authorized representative who has indicated his/her understanding and acceptance.     Dental advisory given  Plan Discussed with: CRNA and Anesthesiologist  Anesthesia Plan Comments:        Anesthesia Quick Evaluation

## 2020-09-21 NOTE — Anesthesia Postprocedure Evaluation (Signed)
Anesthesia Post Note  Patient: Ashley Peters  Procedure(s) Performed: ENDOSCOPIC RETROGRADE CHOLANGIOPANCREATOGRAPHY (ERCP) SPHINCTEROTOMY REMOVAL OF STONES     Patient location during evaluation: PACU Anesthesia Type: General Level of consciousness: awake and alert Pain management: pain level controlled Vital Signs Assessment: post-procedure vital signs reviewed and stable Respiratory status: spontaneous breathing, nonlabored ventilation and respiratory function stable Cardiovascular status: stable and blood pressure returned to baseline Anesthetic complications: no   No notable events documented.  Last Vitals:  Vitals:   09/21/20 1440 09/21/20 1450  BP:    Pulse: 98   Resp:    Temp:    SpO2:  92%    Last Pain:  Vitals:   09/21/20 1450  TempSrc:   PainSc: 0-No pain                 Audry Pili

## 2020-09-21 NOTE — Anesthesia Procedure Notes (Signed)
Procedure Name: Intubation Date/Time: 09/21/2020 1:09 PM Performed by: Gerald Leitz, CRNA Pre-anesthesia Checklist: Patient identified, Patient being monitored, Timeout performed, Emergency Drugs available and Suction available Patient Re-evaluated:Patient Re-evaluated prior to induction Oxygen Delivery Method: Circle system utilized Preoxygenation: Pre-oxygenation with 100% oxygen Induction Type: IV induction Ventilation: Mask ventilation without difficulty Laryngoscope Size: Mac and 3 Grade View: Grade I Tube type: Oral Tube size: 7.0 mm Number of attempts: 1 Airway Equipment and Method: Stylet Placement Confirmation: ETT inserted through vocal cords under direct vision, positive ETCO2 and breath sounds checked- equal and bilateral Secured at: 21 cm Tube secured with: Tape Dental Injury: Teeth and Oropharynx as per pre-operative assessment

## 2020-09-21 NOTE — Progress Notes (Signed)
PROGRESS NOTE    Ashley Peters  BOF:[id number] DOB: [birth year] DOA: 09/19/2020 PCP: Dettinger, [name], MD   Brief Narrative:  HPI: Ashley Peters is a 87 y.o. female with history of colon cancer status post colectomy in remission presents to the ER with complaints of epigastric pain.  Patient has been having epigastric pain for the last 3 days burning in sensation nonradiating has not been eating well because of the fear of pain.  Denies any associated diarrhea fever chills or vomiting.   About 2 weeks ago patient had low back pain had gone to the urgent care where patient was given prednisone dose for 6 days along with muscle relaxant.  Patient completed a course of prednisone but did not take the muscle relaxant as it not made feel good.  Patient also was taking some Tylenol for low back pain.  Low back pain has improved considerably but still persist.  Had followed up with her PCP in the meantime last week.   ED Course: In the ER patient had CT angiogram of the chest abdomen pelvis which shows stable intra and extrahepatic biliary ductal dilatation.  Otherwise unremarkable.  Labs show normal troponins and unremarkable EKG.  COVID test was negative.  Patient's LFTs show alkaline phosphatase of 370 with total bilirubin of 4.5 direct being 2.6.  AST was 402 ALT 427.  WBC count is 17.9 patient is afebrile.  ER physician discussed with on-call [facility] gastroenterologist Dr. Fuller Plan will advise getting MRCP.  Patient admitted for further work-up.  Assessment & Plan:   Principal Problem:   Epigastric pain Active Problems:   History of colon cancer   B12 deficiency   Hepatitis   LFT elevation   Abnormal LFTs   Biliary obstruction   Jaundice   Liver mass  CBD stone with CBD dilatation/hyperbilirubinemia/elevated LFTs/possible cholangiocarcinoma/possible acute cholangitis: Patient feels better, abdomen pain improved.  LFTs and bilirubin improved as well.  GI on board.  She has been started on  antibiotics for presumed possible acute cholangitis, however I personally doubt it.  We will continue antibiotics and steroids started by GI.  She is scheduled to have ERCP today.  Appreciate GI help.  AFP and CEA normal  AKI: Creatinine slightly worse again to 1.63.  I do not see any fluids.  Will start on gentle hydration.  History of colon cancer in remission: Being followed by Dr. Burr Medico.  History of B12 deficiency on monthly vitamin B 2 injections.  DVT prophylaxis: SCDs Start: 09/20/20 1013   Code Status: Full Code  Family Communication: Daughter present at bedside.  Plan of care discussed with both patient and the daughter.  Status is: Inpatient  Remains inpatient appropriate because:Ongoing diagnostic testing needed not appropriate for outpatient work up  Dispo: The patient is from: Home              Anticipated d/c is to: Home              Patient currently is not medically stable to d/c.   Difficult to place patient No        Estimated body mass index is 23.44 kg/m as calculated from the following:   Height as of this encounter: 5' (1.524 m).   Weight as of this encounter: 54.4 kg.      Nutritional status:               Consultants:  GI  Procedures:  none  Antimicrobials:  Anti-infectives (From admission, onward)  Start     Dose/Rate Route Frequency Ordered Stop   09/21/20 2000  ciprofloxacin (CIPRO) IVPB 400 mg        400 mg 200 mL/hr over 60 Minutes Intravenous Every 24 hours 09/21/20 0813     09/20/20 1400  metroNIDAZOLE (FLAGYL) IVPB 500 mg        500 mg 100 mL/hr over 60 Minutes Intravenous Every 8 hours 09/20/20 1150     09/20/20 1200  ciprofloxacin (CIPRO) IVPB 400 mg  Status:  Discontinued        400 mg 200 mL/hr over 60 Minutes Intravenous Every 12 hours 09/20/20 1150 09/21/20 0813          Subjective: Patient seen and examined.  Daughter at the bedside.  Patient feels better.  Pain improved somewhat.  No other  complaint  Objective: Vitals:   09/20/20 0754 09/20/20 0900 09/20/20 2232 09/21/20 0507  BP: 107/62 (!) 101/58 (!) 119/53 (!) 107/57  Pulse: (!) 105 86 100 100  Resp: 17 16 16 16   Temp:  98.6 F (37 C) 99 F (37.2 C) 98.3 F (36.8 C)  TempSrc:  Oral Oral Oral  SpO2: 93% 96% 94% 95%  Weight:      Height:        Intake/Output Summary (Last 24 hours) at 09/21/2020 1126 Last data filed at 09/21/2020 0523 Gross per 24 hour  Intake 2102.14 ml  Output --  Net 2102.14 ml   Filed Weights   09/19/20 1841  Weight: 54.4 kg    Examination:  General exam: Appears calm and comfortable  Respiratory system: Clear to auscultation. Respiratory effort normal. Cardiovascular system: S1 & S2 heard, RRR. No JVD, murmurs, rubs, gallops or clicks. No pedal edema. Gastrointestinal system: Abdomen is nondistended, soft and generalized abdominal tenderness, more pronounced in epigastrium, right and left upper quadrant area. No organomegaly or masses felt. Normal bowel sounds heard. Central nervous system: Alert and oriented. No focal neurological deficits. Extremities: Symmetric 5 x 5 power. Skin: No rashes, lesions or ulcers Psychiatry: Judgement and insight appear normal. Mood & affect appropriate.    Data Reviewed: I have personally reviewed following labs and imaging studies  CBC: Recent Labs  Lab 09/19/20 1845 09/20/20 1042 09/21/20 0301  WBC 17.9* 19.8* 14.5*  NEUTROABS  --  19.0* 13.7*  HGB 14.0 13.8 12.0  HCT 43.4 42.3 37.9  MCV 90.8 91.4 93.1  PLT 205 204 400   Basic Metabolic Panel: Recent Labs  Lab 09/19/20 1845 09/20/20 1042 09/21/20 0301  NA 140 141 136  K 4.1 5.3* 4.7  CL 104 108 102  CO2 25 23 26   GLUCOSE 168* 104* 115*  BUN 19 31* 42*  CREATININE 1.06* 1.34* 1.63*  CALCIUM 9.7 9.3 8.3*   GFR: Estimated Creatinine Clearance: 16.5 mL/min (A) (by C-G formula based on SCr of 1.63 mg/dL (H)). Liver Function Tests: Recent Labs  Lab 09/19/20 1911 09/20/20 1042  09/21/20 0301  AST 502* 271* 130*  ALT 427* 356* 239*  ALKPHOS 370* 323* 265*  BILITOT 4.5* 6.3* 4.3*  PROT 6.4* 6.4* 5.6*  ALBUMIN 3.8 3.3* 2.8*   Recent Labs  Lab 09/19/20 1911  LIPASE 23   No results for input(s): AMMONIA in the last 168 hours. Coagulation Profile: Recent Labs  Lab 09/20/20 1042  INR 1.2   Cardiac Enzymes: No results for input(s): CKTOTAL, CKMB, CKMBINDEX, TROPONINI in the last 168 hours. BNP (last 3 results) No results for input(s): PROBNP in the last 8760 hours. HbA1C:  No results for input(s): HGBA1C in the last 72 hours. CBG: No results for input(s): GLUCAP in the last 168 hours. Lipid Profile: No results for input(s): CHOL, HDL, LDLCALC, TRIG, CHOLHDL, LDLDIRECT in the last 72 hours. Thyroid Function Tests: No results for input(s): TSH, T4TOTAL, FREET4, T3FREE, THYROIDAB in the last 72 hours. Anemia Panel: No results for input(s): VITAMINB12, FOLATE, FERRITIN, TIBC, IRON, RETICCTPCT in the last 72 hours. Sepsis Labs: No results for input(s): PROCALCITON, LATICACIDVEN in the last 168 hours.  Recent Results (from the past 240 hour(s))  Resp Panel by RT-PCR (Flu A&B, Covid) Nasopharyngeal Swab     Status: None   Collection Time: 09/19/20 10:10 PM   Specimen: Nasopharyngeal Swab; Nasopharyngeal(NP) swabs in vial transport medium  Result Value Ref Range Status   SARS Coronavirus 2 by RT PCR NEGATIVE NEGATIVE Final    Comment: (NOTE) SARS-CoV-2 target nucleic acids are NOT DETECTED.  The SARS-CoV-2 RNA is generally detectable in upper respiratory specimens during the acute phase of infection. The lowest concentration of SARS-CoV-2 viral copies this assay can detect is 138 copies/mL. A negative result does not preclude SARS-Cov-2 infection and should not be used as the sole basis for treatment or other patient management decisions. A negative result may occur with  improper specimen collection/handling, submission of specimen other than  nasopharyngeal swab, presence of viral mutation(s) within the areas targeted by this assay, and inadequate number of viral copies(<138 copies/mL). A negative result must be combined with clinical observations, patient history, and epidemiological information. The expected result is Negative.  Fact Sheet for Patients:  EntrepreneurPulse.com.au  Fact Sheet for Healthcare Providers:  IncredibleEmployment.be  This test is no t yet approved or cleared by the [place] FDA and  has been authorized for detection and/or diagnosis of SARS-CoV-2 by FDA under an Emergency Use Authorization (EUA). This EUA will remain  in effect (meaning this test can be used) for the duration of the COVID-19 declaration under Section 564(b)(1) of the Act, 21 U.S.C.section 360bbb-3(b)(1), unless the authorization is terminated  or revoked sooner.       Influenza A by PCR NEGATIVE NEGATIVE Final   Influenza B by PCR NEGATIVE NEGATIVE Final    Comment: (NOTE) The Xpert Xpress SARS-CoV-2/FLU/RSV plus assay is intended as an aid in the diagnosis of influenza from Nasopharyngeal swab specimens and should not be used as a sole basis for treatment. Nasal washings and aspirates are unacceptable for Xpert Xpress SARS-CoV-2/FLU/RSV testing.  Fact Sheet for Patients: EntrepreneurPulse.com.au  Fact Sheet for Healthcare Providers: IncredibleEmployment.be  This test is not yet approved or cleared by the [place] FDA and has been authorized for detection and/or diagnosis of SARS-CoV-2 by FDA under an Emergency Use Authorization (EUA). This EUA will remain in effect (meaning this test can be used) for the duration of the COVID-19 declaration under Section 564(b)(1) of the Act, 21 U.S.C. section 360bbb-3(b)(1), unless the authorization is terminated or revoked.  Performed at [facility], [address] [place], [place] [address]       Radiology Studies: DG Chest 2 View  Result Date: 09/19/2020 CLINICAL DATA:  Chest pain for 2 days EXAM: CHEST - 2 VIEW COMPARISON:  Chest CT 08/25/2019 FINDINGS: The cardiac silhouette, mediastinal and hilar contours are normal. Streaky left basilar scarring changes. No infiltrates, edema or effusions. No pulmonary lesions. The bony thorax is intact. IMPRESSION: Streaky left basilar scarring changes.  No acute pulmonary findings. Electronically Signed   By: Ricky Stabs.D.  On: 09/19/2020 19:29   MR 3D Recon At Scanner  Addendum Date: 09/20/2020   ADDENDUM REPORT: 09/20/2020 12:43 ADDENDUM: For clarification colonic neoplasm should metastatic disease from colon primary. Electronically Signed   By: Zetta Bills M.D.   On: 09/20/2020 12:43   Result Date: 09/20/2020 CLINICAL DATA:  Intrahepatic and extrahepatic biliary duct dilation post cholecystectomy in a 87 year old female. EXAM: MRI ABDOMEN WITHOUT AND WITH CONTRAST (INCLUDING MRCP) TECHNIQUE: Multiplanar multisequence MR imaging of the abdomen was performed both before and after the administration of intravenous contrast. Heavily T2-weighted images of the biliary and pancreatic ducts were obtained, and three-dimensional MRCP images were rendered by post processing. CONTRAST:  31mL GADAVIST GADOBUTROL 1 MMOL/ML IV SOLN COMPARISON:  None. FINDINGS: Lower chest: Limited assessment of lung bases on MRI. No effusion. No consolidation. Hepatobiliary: Diffuse intra and extrahepatic biliary duct dilation. Extensive filling defects with irregular material in the distal common bile duct and confluent intermediate signal in the proximal common bile duct. The common bile duct is essentially packed with material in the lumen. When assessed over time this shows progressive dilation though subtle from examinations at adjacent time points. When assessing comparison studies from 2019 the common bile duct was 12 mm now as  much as 2 cm. This area in the more central common bile duct measures 4.8 x 1.5 cm in coronal (image 18/9) diffuse nearly confluent filling defects are noted in the distal common bile duct. These are best seen on the MRCP sequences, series 14. Hypointense area on T1 in the central liver measuring up to 5 x 3.0 cm and in the peripheral liver measuring 3.5 x 2.6 cm (image 34/11) no significant abnormality on T2 or diffusion with minimal changes in this area on the sequences. Area in the periphery of the RIGHT hepatic lobe (image 38/22 shows T1 hypointensity as outlined above and shows peripheral enhancement, signs of washout and then progressive enhancement later. The more central area of hypointensity is difficult to assess due to susceptibility artifact. This is also true of the intraluminal material within the common bile duct. Vague area of enhancement may be present extending towards the central biliary tree on image 14 of series 22. This is in an area where there is been segmental biliary duct dilation over time and there is a distorted appearance of the biliary tree at this level. No additional lesions are visualized. No visible enhancement of distal biliary contents. Also no visible enhancement within the large area filling the proximal common bile duct adjacent to clip artifact. Pancreas: Pancreatic atrophy. No signs of inflammation or ductal dilation. Spleen:  Spleen normal size and contour. Adrenals/Urinary Tract: Adrenal glands are normal. Symmetric renal enhancement. Renal cysts bilaterally. Largest present on the LEFT in the lower pole similar to recent imaging. Stomach/Bowel: Colonic diverticulosis. Colon contained within a small ventral hernia. No ascites. Vascular/Lymphatic: Normal caliber of the abdominal aorta. Smooth contour of the IVC. Mildly enlarged periportal lymph nodes, slightly increased in size compared to previous imaging largest approximately 12 mm previously 9 mm (image 40/26). Other:   No ascites. Musculoskeletal: No suspicious bone lesions identified. IMPRESSION: 1. Irregular peripheral enhancement and progressive enhancement of the lesion along the hepatic margin suspicious for peribiliary spread of colonic neoplasm given findings on prior studies versus is cholangiocarcinoma. 2. Central biliary cast and stones filling the dilated common bile duct with diffuse biliary duct dilation. No visible enhancement to suggest tumor in these areas though assessment is limited due to clip artifact. Tumor extending into  the RIGHT hepatic duct is difficult to exclude given the presence of clip artifact but on image 26 of series 33 there is suggestion of enhancing material within the lumen. 3. Slight increase in size of celiac lymph nodes dating back to 2021, suspicious in light of other findings. No retroperitoneal adenopathy. These results will be called to the ordering clinician or representative by the Radiologist Assistant, and communication documented in the PACS or [employer]. Electronically Signed: By: Zetta Bills M.D. On: 09/20/2020 12:40   MR ABDOMEN MRCP W WO CONTAST  Addendum Date: 09/20/2020   ADDENDUM REPORT: 09/20/2020 12:43 ADDENDUM: For clarification colonic neoplasm should metastatic disease from colon primary. Electronically Signed   By: Zetta Bills M.D.   On: 09/20/2020 12:43   Result Date: 09/20/2020 CLINICAL DATA:  Intrahepatic and extrahepatic biliary duct dilation post cholecystectomy in a 87 year old female. EXAM: MRI ABDOMEN WITHOUT AND WITH CONTRAST (INCLUDING MRCP) TECHNIQUE: Multiplanar multisequence MR imaging of the abdomen was performed both before and after the administration of intravenous contrast. Heavily T2-weighted images of the biliary and pancreatic ducts were obtained, and three-dimensional MRCP images were rendered by post processing. CONTRAST:  84mL GADAVIST GADOBUTROL 1 MMOL/ML IV SOLN COMPARISON:  None. FINDINGS: Lower chest: Limited assessment of  lung bases on MRI. No effusion. No consolidation. Hepatobiliary: Diffuse intra and extrahepatic biliary duct dilation. Extensive filling defects with irregular material in the distal common bile duct and confluent intermediate signal in the proximal common bile duct. The common bile duct is essentially packed with material in the lumen. When assessed over time this shows progressive dilation though subtle from examinations at adjacent time points. When assessing comparison studies from 2019 the common bile duct was 12 mm now as much as 2 cm. This area in the more central common bile duct measures 4.8 x 1.5 cm in coronal (image 18/9) diffuse nearly confluent filling defects are noted in the distal common bile duct. These are best seen on the MRCP sequences, series 14. Hypointense area on T1 in the central liver measuring up to 5 x 3.0 cm and in the peripheral liver measuring 3.5 x 2.6 cm (image 34/11) no significant abnormality on T2 or diffusion with minimal changes in this area on the sequences. Area in the periphery of the RIGHT hepatic lobe (image 38/22 shows T1 hypointensity as outlined above and shows peripheral enhancement, signs of washout and then progressive enhancement later. The more central area of hypointensity is difficult to assess due to susceptibility artifact. This is also true of the intraluminal material within the common bile duct. Vague area of enhancement may be present extending towards the central biliary tree on image 14 of series 22. This is in an area where there is been segmental biliary duct dilation over time and there is a distorted appearance of the biliary tree at this level. No additional lesions are visualized. No visible enhancement of distal biliary contents. Also no visible enhancement within the large area filling the proximal common bile duct adjacent to clip artifact. Pancreas: Pancreatic atrophy. No signs of inflammation or ductal dilation. Spleen:  Spleen normal size and  contour. Adrenals/Urinary Tract: Adrenal glands are normal. Symmetric renal enhancement. Renal cysts bilaterally. Largest present on the LEFT in the lower pole similar to recent imaging. Stomach/Bowel: Colonic diverticulosis. Colon contained within a small ventral hernia. No ascites. Vascular/Lymphatic: Normal caliber of the abdominal aorta. Smooth contour of the IVC. Mildly enlarged periportal lymph nodes, slightly increased in size compared to previous imaging largest approximately  12 mm previously 9 mm (image 40/26). Other:  No ascites. Musculoskeletal: No suspicious bone lesions identified. IMPRESSION: 1. Irregular peripheral enhancement and progressive enhancement of the lesion along the hepatic margin suspicious for peribiliary spread of colonic neoplasm given findings on prior studies versus is cholangiocarcinoma. 2. Central biliary cast and stones filling the dilated common bile duct with diffuse biliary duct dilation. No visible enhancement to suggest tumor in these areas though assessment is limited due to clip artifact. Tumor extending into the RIGHT hepatic duct is difficult to exclude given the presence of clip artifact but on image 26 of series 33 there is suggestion of enhancing material within the lumen. 3. Slight increase in size of celiac lymph nodes dating back to 2021, suspicious in light of other findings. No retroperitoneal adenopathy. These results will be called to the ordering clinician or representative by the Radiologist Assistant, and communication documented in the PACS or [employer]. Electronically Signed: By: Zetta Bills M.D. On: 09/20/2020 12:40   CT Angio Chest/Abd/Pel for Dissection W and/or Wo Contrast  Result Date: 09/19/2020 CLINICAL DATA:  Chest and abdominal pain. EXAM: CT ANGIOGRAPHY CHEST, ABDOMEN AND PELVIS TECHNIQUE: Multidetector CT imaging through the chest, abdomen and pelvis was performed using the standard protocol during bolus administration of  intravenous contrast. Multiplanar reconstructed images and MIPs were obtained and reviewed to evaluate the vascular anatomy. CONTRAST:  153mL OMNIPAQUE IOHEXOL 350 MG/ML SOLN COMPARISON:  CT scan 08/24/2018 FINDINGS: CTA CHEST FINDINGS Cardiovascular: The heart is normal in size. No pericardial effusion. The aorta is normal in caliber. There is mild tortuosity but no dissection. Remarkably little atherosclerotic calcifications for the patient's age. Minimal coronary artery calcifications. The pulmonary arteries are normal. No filling defects to suggest pulmonary embolism. Mediastinum/Nodes: No mediastinal or hilar mass or adenopathy. The esophagus is grossly normal. There is a small hiatal hernia noted. Lungs/Pleura: No acute pulmonary findings. No worrisome pulmonary lesions. No pleural effusions or pleural nodules. Musculoskeletal: No significant bony findings. No breast masses, supraclavicular or axillary adenopathy. Review of the MIP images confirms the above findings. CTA ABDOMEN AND PELVIS FINDINGS VASCULAR Aorta: Normal caliber. Minimal scattered atherosclerotic calcifications for age. Celiac: Patent SMA: Patent Renals: Patent IMA: Patent Inflow: Patent Veins: Unremarkable Review of the MIP images confirms the above findings. NON-VASCULAR Hepatobiliary: Stable intra and extrahepatic biliary dilatation. No hepatic lesions are identified. Pancreas: No mass, inflammation or ductal dilatation. Spleen: Normal size.  No focal lesions. Adrenals/Urinary Tract: The adrenal glands are unremarkable and stable. Stable renal cysts. No worrisome renal lesions. The bladder is unremarkable. Stomach/Bowel: The stomach, duodenum, small and colon are grossly normal. No acute inflammatory process, mass lesion or obstructive findings. There is significant diffuse colonic diverticulosis and remote surgical changes involving the right colon but no findings to suggest acute diverticulitis. Lymphatic: No mesenteric or retroperitoneal  or pelvic adenopathy. Reproductive: The uterus is surgically absent. Both ovaries are still present and appear normal. Other: No pelvic mass or adenopathy. No free pelvic fluid collections. No inguinal mass or adenopathy. Anterior abdominal wall hernia containing part of the transverse colon, unchanged since prior study. Musculoskeletal: No significant bony findings. Review of the MIP images confirms the above findings. IMPRESSION: 1. Normal thoracic and abdominal aorta. No aneurysm or dissection. 2. No CT findings for pulmonary embolism. 3. No acute pulmonary findings. 4. Stable intra and extrahepatic biliary dilatation. 5. Diffuse colonic diverticulosis without findings for acute diverticulitis. 6. Stable anterior abdominal wall hernia containing part of the transverse colon. Electronically Signed  By: Marijo Sanes M.D.   On: 09/19/2020 20:22    Scheduled Meds: Continuous Infusions:  ciprofloxacin     metronidazole 100 mL/hr at 09/21/20 0523     LOS: 1 day   Time spent: 33-minute   Darliss Cheney, MD [facility]  09/21/2020, 11:26 AM   How to contact the [facility] Attending or Consulting provider [place] or covering provider during after hours [place], for this patient?  Check the care team in [facility] and look for a) attending/consulting [facility] provider listed and b) the [facility] team listed. Page or secure chat [phone]. Log into [email or web address] and use [facility]'s universal password to access. If you do not have the password, please contact the hospital operator. Locate the [facility] provider you are looking for under [facility] and page to a number that you can be directly reached. If you still have difficulty reaching the provider, please page the [facility] (Director on Call) for the Hospitalists listed on amion for assistance.

## 2020-09-21 NOTE — Plan of Care (Signed)
  Problem: Activity: Goal: Risk for activity intolerance will decrease Outcome: Progressing   Problem: Pain Managment: Goal: General experience of comfort will improve Outcome: Progressing   Problem: Safety: Goal: Ability to remain free from injury will improve Outcome: Progressing   

## 2020-09-21 NOTE — Op Note (Signed)
[facility] Patient Name: Ashley Peters Procedure Date: 09/21/2020 MRN: [id number] Attending MD: Gatha Mayer , MD Date of Birth: [birth year] CSN: [id number] Age: 87 Admit Type: Inpatient Procedure:                ERCP Indications:              Bile duct stone(s), For therapy of bile duct                            stone(s) Providers:                Gatha Mayer, MD, Kary Kos RN, RN, Terrall Laity, Laverda Sorenson, Technician, Eliberto Ivory,                            CRNA Referring MD:              Medicines:                General Anesthesia, On cipro and metronidazole IV,                            Indomethacin 790 mg PR Complications:            No immediate complications. Estimated Blood Loss:     Estimated blood loss: none. Procedure:                Pre-Anesthesia Assessment:                           - Prior to the procedure, a History and Physical                            was performed, and patient medications and                            allergies were reviewed. The patient's tolerance of                            previous anesthesia was also reviewed. The risks                            and benefits of the procedure and the sedation                            options and risks were discussed with the patient.                            All questions were answered, and informed consent                            was obtained. Prior Anticoagulants: The patient has                            taken no  previous anticoagulant or antiplatelet                            agents. ASA Grade Assessment: II - A patient with                            mild systemic disease. After reviewing the risks                            and benefits, the patient was deemed in                            satisfactory condition to undergo the procedure.                           After obtaining informed consent, the scope was                             passed under direct vision. Throughout the                            procedure, the patient's blood pressure, pulse, and                            oxygen saturations were monitored continuously. The                            TJF-Q180V ([id number]) Olympus Duodenoscope was                            introduced through the mouth, and used to inject                            contrast into and used to inject contrast into the                            bile duct. The ERCP was somewhat difficult. The                            patient tolerated the procedure well. Scope In: Scope Out: Findings:      A scout film of the abdomen was obtained. Surgical clips, consistent       with a previous cholecystectomy, were seen in the area of the right       upper quadrant of the abdomen. esophagus not seen well. Stomach grossly       normal except some patchy erythema. Duodenum with multiple divertcli,       food debris in them. Major papilla on edge of large diverticulum.       Cannulated deeply and freely then wire placed. Contrast injected       revealing very dilated and tortuous CBD and CHD filled with debris and       stones. 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 No clear strictures  or tumopr infiltration seen in intrahepatics as suspected by MR though       difficult to fill out intrahepatics so cannot exclude. S/p       cholecystectomy.      Large biliary sphincterotomy performed with wire in CBD. 15-18 mm       balloon used and stone fragments removed and then used 2 cm basket and       dragged out more debris, sludge and stones. Dark green bile with pus       balls drained also. Finished with repeat balloon sweeps and occluison       cholangiograms that were negative. Impression:               - Choledocholithiasis with an obstruction was                            found.Purulence indicating cholangits  seen.                            Complete removal was accomplished by biliary                            sphincterotomy and basket extraction.                           - Duodenal diverticulum. Multiple Moderate Sedation:      Not Applicable - Patient had care per Anesthesia. Recommendation:           - CLEAR LIQUIDS TODAY AND SOFT DIET TOMORROW IF OK                           HOME TOMORROW IF OK - DR. HUNG TO SEE IF NECESSARY                            - IF NO SIGNIFICANT PAIN OR OTHER ISSUES MAY BE DC                            PER GI                           LABS AT MY OFFICE 09/26/20                           ADDITIONAL FOLLOW-UP WILL BE ARRANGED BY ME WHEN I                            SEE THOSE                           I WILL NOTIFY DR. Burr Medico RE: ? OF LIVER METS - I DID                            NOT SEE DEFINIITIVE TUMOR INVOLVEMENT ON THESE                            FILMS BUT CANNOT EXCLUDE EITHER  IN CIPOR AND METRONIDAZOLE TIL DC THEN DC ON CIPRO                            250 MG BID AND METRONIDAZOLE 250 MG BID X 3 MORE                            DAYS                           FAMILY UPDATED Procedure Code(s):        --- Professional ---                           [phone], Endoscopic retrograde                            cholangiopancreatography (ERCP); with removal of                            calculi/debris from biliary/pancreatic duct(s)                           43262, Endoscopic retrograde                            cholangiopancreatography (ERCP); with                            sphincterotomy/papillotomy Diagnosis Code(s):        --- Professional ---                           K80.51, Calculus of bile duct without cholangitis                            or cholecystitis with obstruction                           K57.10, Diverticulosis of small intestine without                            perforation or abscess without bleeding CPT copyright 2019  [employer]. All rights reserved. The codes documented in this report are preliminary and upon coder review may  be revised to meet current compliance requirements. Gatha Mayer, MD 09/21/2020 2:46:55 PM This report has been signed electronically. Number of Addenda: 0

## 2020-09-21 NOTE — Progress Notes (Signed)
PHARMACY NOTE:  ANTIMICROBIAL RENAL DOSAGE ADJUSTMENT  Current antimicrobial regimen includes a mismatch between antimicrobial dosage and estimated renal function.  As per policy approved by the Pharmacy & Therapeutics and Medical Executive Committees, the antimicrobial dosage will be adjusted accordingly.  Current antimicrobial dosage:  cipro 400 mg IV q12  Indication: cholangitis  Renal Function:  Estimated Creatinine Clearance: 16.5 mL/min (A) (by C-G formula based on SCr of 1.63 mg/dL (H)). []      On intermittent HD, scheduled: []      On CRRT    Antimicrobial dosage has been changed to:  Cipro 400 mg IV q24   Thank you for allowing pharmacy to be a part of this patient's care.  Eudelia Bunch, Pharm.D 09/21/2020 8:15 AM

## 2020-09-21 NOTE — Transfer of Care (Signed)
Immediate Anesthesia Transfer of Care Note  Patient: Ashley Peters  Procedure(s) Performed: Procedure(s): ENDOSCOPIC RETROGRADE CHOLANGIOPANCREATOGRAPHY (ERCP) (N/A) SPHINCTEROTOMY REMOVAL OF STONES  Patient Location: PACU  Anesthesia Type:General  Level of Consciousness: Alert, Awake, Oriented  Airway & Oxygen Therapy: Patient Spontanous Breathing  Post-op Assessment: Report given to RN  Post vital signs: Reviewed and stable  Last Vitals:  Vitals:   09/21/20 0507 09/21/20 1244  BP: (!) 107/57 135/64  Pulse: 100   Resp: 16 14  Temp: 36.8 C 36.8 C  SpO2: 16% 10%    Complications: No apparent anesthesia complications

## 2020-09-22 LAB — COMPREHENSIVE METABOLIC PANEL
ALT: 155 U/L — ABNORMAL HIGH (ref 0–44)
AST: 47 U/L — ABNORMAL HIGH (ref 15–41)
Albumin: 2.6 g/dL — ABNORMAL LOW (ref 3.5–5.0)
Alkaline Phosphatase: 231 U/L — ABNORMAL HIGH (ref 38–126)
Anion gap: 7 (ref 5–15)
BUN: 50 mg/dL — ABNORMAL HIGH (ref 8–23)
CO2: 24 mmol/L (ref 22–32)
Calcium: 7.9 mg/dL — ABNORMAL LOW (ref 8.9–10.3)
Chloride: 105 mmol/L (ref 98–111)
Creatinine, Ser: 1.43 mg/dL — ABNORMAL HIGH (ref 0.44–1.00)
GFR, Estimated: 35 mL/min — ABNORMAL LOW (ref >60)
Glucose, Bld: 150 mg/dL — ABNORMAL HIGH (ref 70–99)
Potassium: 4.9 mmol/L (ref 3.5–5.1)
Sodium: 136 mmol/L (ref 135–145)
Total Bilirubin: 2 mg/dL — ABNORMAL HIGH (ref 0.3–1.2)
Total Protein: 5.7 g/dL — ABNORMAL LOW (ref 6.5–8.1)

## 2020-09-22 LAB — CBC WITH DIFFERENTIAL/PLATELET
Abs Immature Granulocytes: 0.06 10*3/uL (ref 0.00–0.07)
Basophils Absolute: 0 10*3/uL (ref 0.0–0.1)
Basophils Relative: 0 %
Eosinophils Absolute: 0 10*3/uL (ref 0.0–0.5)
Eosinophils Relative: 0 %
HCT: 35.3 % — ABNORMAL LOW (ref 36.0–46.0)
Hemoglobin: 11.3 g/dL — ABNORMAL LOW (ref 12.0–15.0)
Immature Granulocytes: 1 %
Lymphocytes Relative: 2 %
Lymphs Abs: 0.2 10*3/uL — ABNORMAL LOW (ref 0.7–4.0)
MCH: 29.8 pg (ref 26.0–34.0)
MCHC: 32 g/dL (ref 30.0–36.0)
MCV: 93.1 fL (ref 80.0–100.0)
Monocytes Absolute: 0.1 10*3/uL (ref 0.1–1.0)
Monocytes Relative: 2 %
Neutro Abs: 7.5 10*3/uL (ref 1.7–7.7)
Neutrophils Relative %: 95 %
Platelets: 126 10*3/uL — ABNORMAL LOW (ref 150–400)
RBC: 3.79 MIL/uL — ABNORMAL LOW (ref 3.87–5.11)
RDW: 13.6 % (ref 11.5–15.5)
WBC: 7.8 10*3/uL (ref 4.0–10.5)
nRBC: 0 % (ref 0.0–0.2)

## 2020-09-22 LAB — LIPASE, BLOOD: Lipase: 22 U/L (ref 11–51)

## 2020-09-22 MED ORDER — CIPROFLOXACIN HCL 500 MG PO TABS: 500.0000 mg | ORAL_TABLET | Freq: Two times a day (BID) | ORAL | 0 refills | Status: AC

## 2020-09-22 MED ORDER — METRONIDAZOLE 500 MG PO TABS: 500.0000 mg | ORAL_TABLET | Freq: Three times a day (TID) | ORAL | 0 refills | Status: AC

## 2020-09-22 NOTE — Discharge Summary (Signed)
Physician Discharge Summary  Ashley Peters TJQ:[id number] DOB: 1928-10-14 DOA: 09/19/2020  PCP: Dettinger, [name], MD  Admit date: 09/19/2020 Discharge date: 09/22/2020 30 Day Unplanned Readmission Risk Score    Flowsheet Row ED to Hosp-Admission (Current) from 09/19/2020 in [facility]  30 Day Unplanned Readmission Risk Score (%) 10.56 Filed at 09/22/2020 0800       This score is the patient's risk of an unplanned readmission within 30 days of being discharged (0 -100%). The score is based on dignosis, age, lab data, medications, orders, and past utilization.   Low:  0-14.9   Medium: 15-21.9   High: 22-29.9   Extreme: 30 and above          Admitted From: Home Disposition: Home  Recommendations for Outpatient Follow-up:  Follow up with PCP in 1-2 weeks Please obtain BMP/CBC in one week Please follow up with your PCP on the following pending results: Unresulted Labs (From admission, onward)     Start     Ordered   09/22/20 0500  Comprehensive metabolic panel  Daily,   R      09/21/20 1132   09/22/20 0500  CBC with Differential/Platelet  Daily,   R      09/21/20 1132   09/22/20 0500  CBC  Tomorrow morning,   R        09/21/20 1501              Home Health: None Equipment/Devices: None  Discharge Condition: Stable CODE STATUS: Full code Diet recommendation: Cardiac  Subjective: Seen and examined.  Daughter at the bedside.  She is feeling well without having any nausea or any abdominal pain and tolerating soft diet and wants to go home.  Following HPI and ED course is copied from my colleague hospitalist Dr. Moise Boring H&P. HPI: Ashley Peters is a 87 y.o. female with history of colon cancer status post colectomy in remission presents to the ER with complaints of epigastric pain.  Patient has been having epigastric pain for the last 3 days burning in sensation nonradiating has not been eating well because of the fear of pain.  Denies any  associated diarrhea fever chills or vomiting.   About 2 weeks ago patient had low back pain had gone to the urgent care where patient was given prednisone dose for 6 days along with muscle relaxant.  Patient completed a course of prednisone but did not take the muscle relaxant as it not made feel good.  Patient also was taking some Tylenol for low back pain.  Low back pain has improved considerably but still persist.  Had followed up with her PCP in the meantime last week.   ED Course: In the ER patient had CT angiogram of the chest abdomen pelvis which shows stable intra and extrahepatic biliary ductal dilatation.  Otherwise unremarkable.  Labs show normal troponins and unremarkable EKG.  COVID test was negative.  Patient's LFTs show alkaline phosphatase of 370 with total bilirubin of 4.5 direct being 2.6.  AST was 402 ALT 427.  WBC count is 17.9 patient is afebrile.  ER physician discussed with on-call [place] gastroenterologist Dr. Fuller Plan will advise getting MRCP.  Patient admitted for further work-up.  Brief/Interim Summary: Patient was admitted with CBD stone with CBD dilatation/hyperbilirubinemia/elevated LFTs/possible cholangiocarcinoma and acute cholangitis.  She was started on broad-spectrum antibiotics, ciprofloxacin and Flagyl IV.  GI was consulted.  Patient underwent ERCP and was found to have choledocholithiasis, stone was extracted.  Patient's bilirubin  and LFTs started to improve even before ERCP but improved further after ERCP.  Post ERCP, patient is doing fine without any abdominal pain, tolerating soft diet and lipase is within normal limits.  GI had recommended to discharge her if she is fine.  Per the recommendation, she is being discharged.  They had recommended 3 days of ciprofloxacin and Flagyl however I have prescribed her 5 days.  She will follow-up with PCP and GI.  Discharge Diagnoses:  Principal Problem:   Epigastric pain Active Problems:   History of colon cancer   B12  deficiency   Hepatitis   LFT elevation   Abnormal LFTs   Biliary obstruction   Jaundice   Liver mass   Calculus of bile duct with cholangitis and obstruction    Discharge Instructions   Allergies as of 09/22/2020       Reactions   Benicar [olmesartan Medoxomil] Other (See Comments)   Elevated Potassium   Amoxicillin Rash   Has patient had a PCN reaction causing immediate rash, facial/tongue/throat swelling, SOB or lightheadedness with hypotension: Yes Has patient had a PCN reaction causing severe rash involving mucus membranes or skin necrosis: No Has patient had a PCN reaction that required hospitalization: No Has patient had a PCN reaction occurring within the last 10 years: No If all of the above answers are "NO", then may proceed with Cephalosporin use.        Medication List     TAKE these medications    acetaminophen 500 MG tablet Commonly known as: TYLENOL Take 2 tablets (1,000 mg total) by mouth every 6 (six) hours as needed for moderate pain. What changed:  how much to take reasons to take this   alendronate 70 MG tablet Commonly known as: FOSAMAX Take with a full glass of water on an empty stomach. What changed:  how much to take how to take this when to take this   ciprofloxacin 500 MG tablet Commonly known as: CIPRO Take 1 tablet (500 mg total) by mouth 2 (two) times daily for 5 days.   ferrous sulfate 325 (65 FE) MG tablet Take 325 mg by mouth every Monday.   metroNIDAZOLE 500 MG tablet Commonly known as: Flagyl Take 1 tablet (500 mg total) by mouth 3 (three) times daily for 5 days.   PRESERVISION AREDS 2 PO Take 1 capsule by mouth 2 (two) times daily.        Follow-up Information     Dettinger, [name], MD Follow up in 1 week(s).   Specialties: Family Medicine, Cardiology Contact information: [place] [address] [phone]                Allergies  Allergen Reactions   Benicar [Olmesartan Medoxomil]  Other (See Comments)    Elevated Potassium   Amoxicillin Rash    Has patient had a PCN reaction causing immediate rash, facial/tongue/throat swelling, SOB or lightheadedness with hypotension: Yes Has patient had a PCN reaction causing severe rash involving mucus membranes or skin necrosis: No Has patient had a PCN reaction that required hospitalization: No Has patient had a PCN reaction occurring within the last 10 years: No If all of the above answers are "NO", then may proceed with Cephalosporin use.    Consultations: GI   Procedures/Studies: DG Chest 2 View  Result Date: 09/19/2020 CLINICAL DATA:  Chest pain for 2 days EXAM: CHEST - 2 VIEW COMPARISON:  Chest CT 08/25/2019 FINDINGS: The cardiac silhouette, mediastinal and hilar contours  are normal. Streaky left basilar scarring changes. No infiltrates, edema or effusions. No pulmonary lesions. The bony thorax is intact. IMPRESSION: Streaky left basilar scarring changes.  No acute pulmonary findings. Electronically Signed   By: Marijo Sanes M.D.   On: 09/19/2020 19:29   DG Lumbar Spine 2-3 Views  Result Date: 09/13/2020 CLINICAL DATA:  Right-sided lumbar pain for 1 week without trauma EXAM: LUMBAR SPINE - 2-3 VIEW COMPARISON:  08/24/2019 abdominopelvic CT FINDINGS: AP and lateral views. Convex left lumbar spine curvature is mild to moderate. Sacroiliac joints are symmetric. Surgical clips in the right upper abdomen. Multilevel degenerative disc disease and facet arthropathy, expected for age. Trace L4-5 anterolisthesis is unchanged. Maintenance of vertebral body height. IMPRESSION: Spinal curvature and advanced spondylosis. No vertebral body height loss or other acute finding. Electronically Signed   By: Abigail Miyamoto M.D.   On: 09/13/2020 14:48   MR 3D Recon At Scanner  Addendum Date: 09/20/2020   ADDENDUM REPORT: 09/20/2020 12:43 ADDENDUM: For clarification colonic neoplasm should metastatic disease from colon primary. Electronically Signed    By: Zetta Bills M.D.   On: 09/20/2020 12:43   Result Date: 09/20/2020 CLINICAL DATA:  Intrahepatic and extrahepatic biliary duct dilation post cholecystectomy in a 87 year old female. EXAM: MRI ABDOMEN WITHOUT AND WITH CONTRAST (INCLUDING MRCP) TECHNIQUE: Multiplanar multisequence MR imaging of the abdomen was performed both before and after the administration of intravenous contrast. Heavily T2-weighted images of the biliary and pancreatic ducts were obtained, and three-dimensional MRCP images were rendered by post processing. CONTRAST:  39mL GADAVIST GADOBUTROL 1 MMOL/ML IV SOLN COMPARISON:  None. FINDINGS: Lower chest: Limited assessment of lung bases on MRI. No effusion. No consolidation. Hepatobiliary: Diffuse intra and extrahepatic biliary duct dilation. Extensive filling defects with irregular material in the distal common bile duct and confluent intermediate signal in the proximal common bile duct. The common bile duct is essentially packed with material in the lumen. When assessed over time this shows progressive dilation though subtle from examinations at adjacent time points. When assessing comparison studies from 2019 the common bile duct was 12 mm now as much as 2 cm. This area in the more central common bile duct measures 4.8 x 1.5 cm in coronal (image 18/9) diffuse nearly confluent filling defects are noted in the distal common bile duct. These are best seen on the MRCP sequences, series 14. Hypointense area on T1 in the central liver measuring up to 5 x 3.0 cm and in the peripheral liver measuring 3.5 x 2.6 cm (image 34/11) no significant abnormality on T2 or diffusion with minimal changes in this area on the sequences. Area in the periphery of the RIGHT hepatic lobe (image 38/22 shows T1 hypointensity as outlined above and shows peripheral enhancement, signs of washout and then progressive enhancement later. The more central area of hypointensity is difficult to assess due to susceptibility  artifact. This is also true of the intraluminal material within the common bile duct. Vague area of enhancement may be present extending towards the central biliary tree on image 14 of series 22. This is in an area where there is been segmental biliary duct dilation over time and there is a distorted appearance of the biliary tree at this level. No additional lesions are visualized. No visible enhancement of distal biliary contents. Also no visible enhancement within the large area filling the proximal common bile duct adjacent to clip artifact. Pancreas: Pancreatic atrophy. No signs of inflammation or ductal dilation. Spleen:  Spleen normal size and  contour. Adrenals/Urinary Tract: Adrenal glands are normal. Symmetric renal enhancement. Renal cysts bilaterally. Largest present on the LEFT in the lower pole similar to recent imaging. Stomach/Bowel: Colonic diverticulosis. Colon contained within a small ventral hernia. No ascites. Vascular/Lymphatic: Normal caliber of the abdominal aorta. Smooth contour of the IVC. Mildly enlarged periportal lymph nodes, slightly increased in size compared to previous imaging largest approximately 12 mm previously 9 mm (image 40/26). Other:  No ascites. Musculoskeletal: No suspicious bone lesions identified. IMPRESSION: 1. Irregular peripheral enhancement and progressive enhancement of the lesion along the hepatic margin suspicious for peribiliary spread of colonic neoplasm given findings on prior studies versus is cholangiocarcinoma. 2. Central biliary cast and stones filling the dilated common bile duct with diffuse biliary duct dilation. No visible enhancement to suggest tumor in these areas though assessment is limited due to clip artifact. Tumor extending into the RIGHT hepatic duct is difficult to exclude given the presence of clip artifact but on image 26 of series 33 there is suggestion of enhancing material within the lumen. 3. Slight increase in size of celiac lymph nodes  dating back to 2021, suspicious in light of other findings. No retroperitoneal adenopathy. These results will be called to the ordering clinician or representative by the Radiologist Assistant, and communication documented in the PACS or [employer]. Electronically Signed: By: Zetta Bills M.D. On: 09/20/2020 12:40   DG ERCP BILIARY & PANCREATIC DUCTS  Result Date: 09/21/2020 CLINICAL DATA:  ERCP with sphincterotomy for CBD stones. EXAM: ERCP TECHNIQUE: Multiple spot images obtained with the fluoroscopic device and submitted for interpretation post-procedure. FLUOROSCOPY TIME:  3 minutes, 49 seconds COMPARISON:  MRCP-09/20/2020 FINDINGS: Thirteen spot fluoroscopic images the right upper abdominal quadrant during ERCP are provided for review Initial image demonstrates an ERCP probe overlying the right upper abdominal quadrant. Surgical clips overlie the expected location of the gallbladder fossa. There is selective cannulation and opacification of the CBD which appears markedly dilated. There are multiple ill-defined near occlusive filling defect seen throughout the CBD suggestive of choledocholithiasis. Subsequent images demonstrate insufflation of a balloon within the central aspect of the CBD with subsequent biliary sweeping and presumed sphincterotomy. There is minimal opacification of the central aspect of the intrahepatic biliary tree which appears mild to moderately dilated. Contrast is seen within the duodenum. IMPRESSION: ERCP with findings of extensive choledocholithiasis with subsequent biliary sweeping and presumed sphincterotomy. These images were submitted for radiologic interpretation only. Please see the procedural report for the amount of contrast and the fluoroscopy time utilized. Electronically Signed   By: Sandi Mariscal M.D.   On: 09/21/2020 16:21   MR ABDOMEN MRCP W WO CONTAST  Addendum Date: 09/20/2020   ADDENDUM REPORT: 09/20/2020 12:43 ADDENDUM: For clarification colonic neoplasm  should metastatic disease from colon primary. Electronically Signed   By: Zetta Bills M.D.   On: 09/20/2020 12:43   Result Date: 09/20/2020 CLINICAL DATA:  Intrahepatic and extrahepatic biliary duct dilation post cholecystectomy in a 87 year old female. EXAM: MRI ABDOMEN WITHOUT AND WITH CONTRAST (INCLUDING MRCP) TECHNIQUE: Multiplanar multisequence MR imaging of the abdomen was performed both before and after the administration of intravenous contrast. Heavily T2-weighted images of the biliary and pancreatic ducts were obtained, and three-dimensional MRCP images were rendered by post processing. CONTRAST:  20mL GADAVIST GADOBUTROL 1 MMOL/ML IV SOLN COMPARISON:  None. FINDINGS: Lower chest: Limited assessment of lung bases on MRI. No effusion. No consolidation. Hepatobiliary: Diffuse intra and extrahepatic biliary duct dilation. Extensive filling defects with irregular material in the  distal common bile duct and confluent intermediate signal in the proximal common bile duct. The common bile duct is essentially packed with material in the lumen. When assessed over time this shows progressive dilation though subtle from examinations at adjacent time points. When assessing comparison studies from 2019 the common bile duct was 12 mm now as much as 2 cm. This area in the more central common bile duct measures 4.8 x 1.5 cm in coronal (image 18/9) diffuse nearly confluent filling defects are noted in the distal common bile duct. These are best seen on the MRCP sequences, series 14. Hypointense area on T1 in the central liver measuring up to 5 x 3.0 cm and in the peripheral liver measuring 3.5 x 2.6 cm (image 34/11) no significant abnormality on T2 or diffusion with minimal changes in this area on the sequences. Area in the periphery of the RIGHT hepatic lobe (image 38/22 shows T1 hypointensity as outlined above and shows peripheral enhancement, signs of washout and then progressive enhancement later. The more central  area of hypointensity is difficult to assess due to susceptibility artifact. This is also true of the intraluminal material within the common bile duct. Vague area of enhancement may be present extending towards the central biliary tree on image 14 of series 22. This is in an area where there is been segmental biliary duct dilation over time and there is a distorted appearance of the biliary tree at this level. No additional lesions are visualized. No visible enhancement of distal biliary contents. Also no visible enhancement within the large area filling the proximal common bile duct adjacent to clip artifact. Pancreas: Pancreatic atrophy. No signs of inflammation or ductal dilation. Spleen:  Spleen normal size and contour. Adrenals/Urinary Tract: Adrenal glands are normal. Symmetric renal enhancement. Renal cysts bilaterally. Largest present on the LEFT in the lower pole similar to recent imaging. Stomach/Bowel: Colonic diverticulosis. Colon contained within a small ventral hernia. No ascites. Vascular/Lymphatic: Normal caliber of the abdominal aorta. Smooth contour of the IVC. Mildly enlarged periportal lymph nodes, slightly increased in size compared to previous imaging largest approximately 12 mm previously 9 mm (image 40/26). Other:  No ascites. Musculoskeletal: No suspicious bone lesions identified. IMPRESSION: 1. Irregular peripheral enhancement and progressive enhancement of the lesion along the hepatic margin suspicious for peribiliary spread of colonic neoplasm given findings on prior studies versus is cholangiocarcinoma. 2. Central biliary cast and stones filling the dilated common bile duct with diffuse biliary duct dilation. No visible enhancement to suggest tumor in these areas though assessment is limited due to clip artifact. Tumor extending into the RIGHT hepatic duct is difficult to exclude given the presence of clip artifact but on image 26 of series 33 there is suggestion of enhancing material  within the lumen. 3. Slight increase in size of celiac lymph nodes dating back to 2021, suspicious in light of other findings. No retroperitoneal adenopathy. These results will be called to the ordering clinician or representative by the Radiologist Assistant, and communication documented in the PACS or [employer]. Electronically Signed: By: Zetta Bills M.D. On: 09/20/2020 12:40   CT Angio Chest/Abd/Pel for Dissection W and/or Wo Contrast  Result Date: 09/19/2020 CLINICAL DATA:  Chest and abdominal pain. EXAM: CT ANGIOGRAPHY CHEST, ABDOMEN AND PELVIS TECHNIQUE: Multidetector CT imaging through the chest, abdomen and pelvis was performed using the standard protocol during bolus administration of intravenous contrast. Multiplanar reconstructed images and MIPs were obtained and reviewed to evaluate the vascular anatomy. CONTRAST:  111mL OMNIPAQUE  IOHEXOL 350 MG/ML SOLN COMPARISON:  CT scan 08/24/2018 FINDINGS: CTA CHEST FINDINGS Cardiovascular: The heart is normal in size. No pericardial effusion. The aorta is normal in caliber. There is mild tortuosity but no dissection. Remarkably little atherosclerotic calcifications for the patient's age. Minimal coronary artery calcifications. The pulmonary arteries are normal. No filling defects to suggest pulmonary embolism. Mediastinum/Nodes: No mediastinal or hilar mass or adenopathy. The esophagus is grossly normal. There is a small hiatal hernia noted. Lungs/Pleura: No acute pulmonary findings. No worrisome pulmonary lesions. No pleural effusions or pleural nodules. Musculoskeletal: No significant bony findings. No breast masses, supraclavicular or axillary adenopathy. Review of the MIP images confirms the above findings. CTA ABDOMEN AND PELVIS FINDINGS VASCULAR Aorta: Normal caliber. Minimal scattered atherosclerotic calcifications for age. Celiac: Patent SMA: Patent Renals: Patent IMA: Patent Inflow: Patent Veins: Unremarkable Review of the MIP images confirms  the above findings. NON-VASCULAR Hepatobiliary: Stable intra and extrahepatic biliary dilatation. No hepatic lesions are identified. Pancreas: No mass, inflammation or ductal dilatation. Spleen: Normal size.  No focal lesions. Adrenals/Urinary Tract: The adrenal glands are unremarkable and stable. Stable renal cysts. No worrisome renal lesions. The bladder is unremarkable. Stomach/Bowel: The stomach, duodenum, small and colon are grossly normal. No acute inflammatory process, mass lesion or obstructive findings. There is significant diffuse colonic diverticulosis and remote surgical changes involving the right colon but no findings to suggest acute diverticulitis. Lymphatic: No mesenteric or retroperitoneal or pelvic adenopathy. Reproductive: The uterus is surgically absent. Both ovaries are still present and appear normal. Other: No pelvic mass or adenopathy. No free pelvic fluid collections. No inguinal mass or adenopathy. Anterior abdominal wall hernia containing part of the transverse colon, unchanged since prior study. Musculoskeletal: No significant bony findings. Review of the MIP images confirms the above findings. IMPRESSION: 1. Normal thoracic and abdominal aorta. No aneurysm or dissection. 2. No CT findings for pulmonary embolism. 3. No acute pulmonary findings. 4. Stable intra and extrahepatic biliary dilatation. 5. Diffuse colonic diverticulosis without findings for acute diverticulitis. 6. Stable anterior abdominal wall hernia containing part of the transverse colon. Electronically Signed   By: Marijo Sanes M.D.   On: 09/19/2020 20:22     Discharge Exam: Vitals:   09/22/20 0201 09/22/20 0517  BP: 139/79 (!) 166/79  Pulse: 69 66  Resp: 16 16  Temp: 97.7 F (36.5 C) 97.9 F (36.6 C)  SpO2: 95% 94%   Vitals:   09/21/20 1823 09/21/20 2109 09/22/20 0201 09/22/20 0517  BP: (!) 145/66 (!) 149/75 139/79 (!) 166/79  Pulse: 75 71 69 66  Resp: 16 16 16 16   Temp: 98.1 F (36.7 C) 97.9 F (36.6  C) 97.7 F (36.5 C) 97.9 F (36.6 C)  TempSrc: Oral Oral Oral Oral  SpO2: 93% 94% 95% 94%  Weight:      Height:        General: Pt is alert, awake, not in acute distress Cardiovascular: RRR, S1/S2 +, no rubs, no gallops Respiratory: CTA bilaterally, no wheezing, no rhonchi Abdominal: Soft, NT, ND, bowel sounds + Extremities: no edema, no cyanosis    The results of significant diagnostics from this hospitalization (including imaging, microbiology, ancillary and laboratory) are listed below for reference.     Microbiology: Recent Results (from the past 240 hour(s))  Resp Panel by RT-PCR (Flu A&B, Covid) Nasopharyngeal Swab     Status: None   Collection Time: 09/19/20 10:10 PM   Specimen: Nasopharyngeal Swab; Nasopharyngeal(NP) swabs in vial transport medium  Result Value Ref Range  Status   SARS Coronavirus 2 by RT PCR NEGATIVE NEGATIVE Final    Comment: (NOTE) SARS-CoV-2 target nucleic acids are NOT DETECTED.  The SARS-CoV-2 RNA is generally detectable in upper respiratory specimens during the acute phase of infection. The lowest concentration of SARS-CoV-2 viral copies this assay can detect is 138 copies/mL. A negative result does not preclude SARS-Cov-2 infection and should not be used as the sole basis for treatment or other patient management decisions. A negative result may occur with  improper specimen collection/handling, submission of specimen other than nasopharyngeal swab, presence of viral mutation(s) within the areas targeted by this assay, and inadequate number of viral copies(<138 copies/mL). A negative result must be combined with clinical observations, patient history, and epidemiological information. The expected result is Negative.  Fact Sheet for Patients:  EntrepreneurPulse.com.au  Fact Sheet for Healthcare Providers:  IncredibleEmployment.be  This test is no t yet approved or cleared by the [place] FDA and   has been authorized for detection and/or diagnosis of SARS-CoV-2 by FDA under an Emergency Use Authorization (EUA). This EUA will remain  in effect (meaning this test can be used) for the duration of the COVID-19 declaration under Section 564(b)(1) of the Act, 21 U.S.C.section 360bbb-3(b)(1), unless the authorization is terminated  or revoked sooner.       Influenza A by PCR NEGATIVE NEGATIVE Final   Influenza B by PCR NEGATIVE NEGATIVE Final    Comment: (NOTE) The Xpert Xpress SARS-CoV-2/FLU/RSV plus assay is intended as an aid in the diagnosis of influenza from Nasopharyngeal swab specimens and should not be used as a sole basis for treatment. Nasal washings and aspirates are unacceptable for Xpert Xpress SARS-CoV-2/FLU/RSV testing.  Fact Sheet for Patients: EntrepreneurPulse.com.au  Fact Sheet for Healthcare Providers: IncredibleEmployment.be  This test is not yet approved or cleared by the [place] FDA and has been authorized for detection and/or diagnosis of SARS-CoV-2 by FDA under an Emergency Use Authorization (EUA). This EUA will remain in effect (meaning this test can be used) for the duration of the COVID-19 declaration under Section 564(b)(1) of the Act, 21 U.S.C. section 360bbb-3(b)(1), unless the authorization is terminated or revoked.  Performed at [facility], [address], [place],  [address]      Labs: BNP (last 3 results) No results for input(s): BNP in the last 8760 hours. Basic Metabolic Panel: Recent Labs  Lab 09/19/20 1845 09/20/20 1042 09/21/20 0301 09/22/20 0209  NA 140 141 136 136  K 4.1 5.3* 4.7 4.9  CL 104 108 102 105  CO2 25 23 26 24   GLUCOSE 168* 104* 115* 150*  BUN 19 31* 42* 50*  CREATININE 1.06* 1.34* 1.63* 1.43*  CALCIUM 9.7 9.3 8.3* 7.9*   Liver Function Tests: Recent Labs  Lab 09/19/20 1911 09/20/20 1042 09/21/20 0301 09/22/20 0209  AST 502* 271*  130* 47*  ALT 427* 356* 239* 155*  ALKPHOS 370* 323* 265* 231*  BILITOT 4.5* 6.3* 4.3* 2.0*  PROT 6.4* 6.4* 5.6* 5.7*  ALBUMIN 3.8 3.3* 2.8* 2.6*   Recent Labs  Lab 09/19/20 1911 09/22/20 0209  LIPASE 23 22   No results for input(s): AMMONIA in the last 168 hours. CBC: Recent Labs  Lab 09/19/20 1845 09/20/20 1042 09/21/20 0301 09/22/20 0209  WBC 17.9* 19.8* 14.5* 7.8  NEUTROABS  --  19.0* 13.7* 7.5  HGB 14.0 13.8 12.0 11.3*  HCT 43.4 42.3 37.9 35.3*  MCV 90.8 91.4 93.1 93.1  PLT 205 204 161 126*  Cardiac Enzymes: No results for input(s): CKTOTAL, CKMB, CKMBINDEX, TROPONINI in the last 168 hours. BNP: Invalid input(s): POCBNP CBG: No results for input(s): GLUCAP in the last 168 hours. D-Dimer No results for input(s): DDIMER in the last 72 hours. Hgb A1c No results for input(s): HGBA1C in the last 72 hours. Lipid Profile No results for input(s): CHOL, HDL, LDLCALC, TRIG, CHOLHDL, LDLDIRECT in the last 72 hours. Thyroid function studies No results for input(s): TSH, T4TOTAL, T3FREE, THYROIDAB in the last 72 hours.  Invalid input(s): FREET3 Anemia work up No results for input(s): VITAMINB12, FOLATE, FERRITIN, TIBC, IRON, RETICCTPCT in the last 72 hours. Urinalysis    Component Value Date/Time   BILIRUBINUR neg 01/03/2013 1108   PROTEINUR 100 01/03/2013 1108   UROBILINOGEN negative 01/03/2013 1108   NITRITE neg 01/03/2013 1108   LEUKOCYTESUR moderate (2+) 01/03/2013 1108   Sepsis Labs Invalid input(s): PROCALCITONIN,  WBC,  LACTICIDVEN Microbiology Recent Results (from the past 240 hour(s))  Resp Panel by RT-PCR (Flu A&B, Covid) Nasopharyngeal Swab     Status: None   Collection Time: 09/19/20 10:10 PM   Specimen: Nasopharyngeal Swab; Nasopharyngeal(NP) swabs in vial transport medium  Result Value Ref Range Status   SARS Coronavirus 2 by RT PCR NEGATIVE NEGATIVE Final    Comment: (NOTE) SARS-CoV-2 target nucleic acids are NOT DETECTED.  The SARS-CoV-2 RNA  is generally detectable in upper respiratory specimens during the acute phase of infection. The lowest concentration of SARS-CoV-2 viral copies this assay can detect is 138 copies/mL. A negative result does not preclude SARS-Cov-2 infection and should not be used as the sole basis for treatment or other patient management decisions. A negative result may occur with  improper specimen collection/handling, submission of specimen other than nasopharyngeal swab, presence of viral mutation(s) within the areas targeted by this assay, and inadequate number of viral copies(<138 copies/mL). A negative result must be combined with clinical observations, patient history, and epidemiological information. The expected result is Negative.  Fact Sheet for Patients:  EntrepreneurPulse.com.au  Fact Sheet for Healthcare Providers:  IncredibleEmployment.be  This test is no t yet approved or cleared by the [place] FDA and  has been authorized for detection and/or diagnosis of SARS-CoV-2 by FDA under an Emergency Use Authorization (EUA). This EUA will remain  in effect (meaning this test can be used) for the duration of the COVID-19 declaration under Section 564(b)(1) of the Act, 21 U.S.C.section 360bbb-3(b)(1), unless the authorization is terminated  or revoked sooner.       Influenza A by PCR NEGATIVE NEGATIVE Final   Influenza B by PCR NEGATIVE NEGATIVE Final    Comment: (NOTE) The Xpert Xpress SARS-CoV-2/FLU/RSV plus assay is intended as an aid in the diagnosis of influenza from Nasopharyngeal swab specimens and should not be used as a sole basis for treatment. Nasal washings and aspirates are unacceptable for Xpert Xpress SARS-CoV-2/FLU/RSV testing.  Fact Sheet for Patients: EntrepreneurPulse.com.au  Fact Sheet for Healthcare Providers: IncredibleEmployment.be  This test is not yet approved or cleared by the  [place] FDA and has been authorized for detection and/or diagnosis of SARS-CoV-2 by FDA under an Emergency Use Authorization (EUA). This EUA will remain in effect (meaning this test can be used) for the duration of the COVID-19 declaration under Section 564(b)(1) of the Act, 21 U.S.C. section 360bbb-3(b)(1), unless the authorization is terminated or revoked.  Performed at [facility], [address], [place], [place] [address]      Time coordinating discharge: Over 30 minutes  SIGNED:   Darliss Cheney, MD  [facility] 09/22/2020, 8:16 AM  If 7PM-7AM, please contact night-coverage [email or web address]

## 2020-09-24 ENCOUNTER — Telehealth: Payer: Self-pay | Admitting: Hematology

## 2020-09-24 ENCOUNTER — Telehealth: Payer: Self-pay

## 2020-09-24 NOTE — Telephone Encounter (Signed)
Transition Care Management Follow-up Telephone Call Date of discharge and from where: 09/22/20 [facility] Diagnosis: epigastric pain, ERCP How have you been since you were released from the hospital? Better, pretty tired, not much energy, intermittent nausea, eating okay, drinking plenty Any questions or concerns? No  Items Reviewed: Did the pt receive and understand the discharge instructions provided? Yes  Medications obtained and verified? Yes  Other? No  Any new allergies since your discharge? No  Dietary orders reviewed? Yes Do you have support at home? Yes   Home Care and Equipment/Supplies: Were home health services ordered? no If so, what is the name of the agency? N/a  Has the agency set up a time to come to the patient's home? not applicable Were any new equipment or medical supplies ordered?  No What is the name of the medical supply agency? N/a Were you able to get the supplies/equipment? not applicable Do you have any questions related to the use of the equipment or supplies? No  Functional Questionnaire: (I = Independent and D = Dependent) ADLs: I  Bathing/Dressing- I  Meal Prep- I  Eating- I  Maintaining continence- I  Transferring/Ambulation- I  Managing Meds- I  Follow up appointments reviewed:  PCP Hospital f/u appt confirmed? Yes  Scheduled to see On[place] on 09/28/20 @ 8:15. [facility] f/u appt confirmed? Yes  Scheduled to see Oncology on 7/20 @ 12. Are transportation arrangements needed? No  If their condition worsens, is the pt aware to call PCP or go to the Emergency Dept.? Yes Was the patient provided with contact information for the PCP's office or ED? Yes Was to pt encouraged to call back with questions or concerns? Yes

## 2020-09-24 NOTE — Telephone Encounter (Signed)
Scheduled appointment per 07/16 sch msg. Patient is aware.

## 2020-09-25 ENCOUNTER — Telehealth: Payer: Self-pay | Admitting: Family Medicine

## 2020-09-25 MED ORDER — ONDANSETRON 4 MG PO TBDP: 4.0000 mg | ORAL_TABLET | Freq: Three times a day (TID) | ORAL | 0 refills | Status: DC | PRN

## 2020-09-25 NOTE — Telephone Encounter (Signed)
Sent Zofran for the patient, also recommend probiotic for her.

## 2020-09-25 NOTE — Telephone Encounter (Signed)
Pt had ERCP in 7/15 by Dr. Carlean Purl.  She was prescribed Cipro and Flagyl. Pt is feeling nauseated and has vomited x1. She is able to eat and drink some.

## 2020-09-25 NOTE — Telephone Encounter (Signed)
Serita informed and understood.

## 2020-09-26 ENCOUNTER — Other Ambulatory Visit: Payer: Self-pay

## 2020-09-26 ENCOUNTER — Telehealth: Payer: Self-pay | Admitting: Gastroenterology

## 2020-09-26 ENCOUNTER — Encounter: Payer: Self-pay | Admitting: Hematology

## 2020-09-26 ENCOUNTER — Inpatient Hospital Stay (HOSPITAL_BASED_OUTPATIENT_CLINIC_OR_DEPARTMENT_OTHER): Payer: Medicare Other | Admitting: Hematology

## 2020-09-26 ENCOUNTER — Inpatient Hospital Stay: Payer: Medicare Other | Attending: Hematology

## 2020-09-26 VITALS — BP 119/76 | HR 88 | Temp 98.6°F | Resp 17 | Ht 60.0 in | Wt 122.7 lb

## 2020-09-26 DIAGNOSIS — E538 Deficiency of other specified B group vitamins: Secondary | ICD-10-CM | POA: Diagnosis not present

## 2020-09-26 DIAGNOSIS — D649 Anemia, unspecified: Secondary | ICD-10-CM | POA: Diagnosis not present

## 2020-09-26 DIAGNOSIS — D500 Iron deficiency anemia secondary to blood loss (chronic): Secondary | ICD-10-CM

## 2020-09-26 DIAGNOSIS — Z85038 Personal history of other malignant neoplasm of large intestine: Secondary | ICD-10-CM | POA: Insufficient documentation

## 2020-09-26 DIAGNOSIS — C182 Malignant neoplasm of ascending colon: Secondary | ICD-10-CM

## 2020-09-26 DIAGNOSIS — K8040 Calculus of bile duct with cholecystitis, unspecified, without obstruction: Secondary | ICD-10-CM | POA: Insufficient documentation

## 2020-09-26 LAB — COMPREHENSIVE METABOLIC PANEL
ALT: 38 U/L (ref 0–44)
AST: 17 U/L (ref 15–41)
Albumin: 3.2 g/dL — ABNORMAL LOW (ref 3.5–5.0)
Alkaline Phosphatase: 152 U/L — ABNORMAL HIGH (ref 38–126)
Anion gap: 17 — ABNORMAL HIGH (ref 5–15)
BUN: 15 mg/dL (ref 8–23)
CO2: 20 mmol/L — ABNORMAL LOW (ref 22–32)
Calcium: 9.2 mg/dL (ref 8.9–10.3)
Chloride: 101 mmol/L (ref 98–111)
Creatinine, Ser: 0.87 mg/dL (ref 0.44–1.00)
GFR, Estimated: >60 mL/min (ref >60)
Glucose, Bld: 114 mg/dL — ABNORMAL HIGH (ref 70–99)
Potassium: 3.4 mmol/L — ABNORMAL LOW (ref 3.5–5.1)
Sodium: 138 mmol/L (ref 135–145)
Total Bilirubin: 1.1 mg/dL (ref 0.3–1.2)
Total Protein: 6.4 g/dL — ABNORMAL LOW (ref 6.5–8.1)

## 2020-09-26 LAB — CEA (IN HOUSE-CHCC): CEA (CHCC-In House): 2.91 ng/mL (ref 0.00–5.00)

## 2020-09-26 LAB — VITAMIN B12: Vitamin B-12: 1107 pg/mL — ABNORMAL HIGH (ref 180–914)

## 2020-09-26 LAB — IRON AND TIBC
Iron: 37 ug/dL — ABNORMAL LOW (ref 41–142)
Saturation Ratios: 17 % — ABNORMAL LOW (ref 21–57)
TIBC: 215 ug/dL — ABNORMAL LOW (ref 236–444)
UIBC: 178 ug/dL (ref 120–384)

## 2020-09-26 LAB — FERRITIN: Ferritin: 382 ng/mL — ABNORMAL HIGH (ref 11–307)

## 2020-09-26 NOTE — Telephone Encounter (Signed)
Patients daughter called to follow up and stated the patient started having diarrhea along with vomiting yesterday.She would like to speak with a nurse to make sure she's ok, might just be from antibiotics. They also spoke with the patients PCP yesterday to get advise and they prescribed her Zofran for the nausea but has not started it.

## 2020-09-26 NOTE — Telephone Encounter (Signed)
Lm on vm for patient's daughter to return call.  

## 2020-09-26 NOTE — Telephone Encounter (Signed)
Called and spoke to patient's daughter and gave all Dr. Celesta Aver recommendations. She states her Mom did start the Zofran today and has not had any vomiting or diarrhea since. If diarrhea or vomiting starts back she knows to call us for stool test and if severe to go to the ED.

## 2020-09-26 NOTE — Telephone Encounter (Signed)
Spoke with patient's daughter, Donah Driver, in regards to her concerns. She states that patient was discharged on Saturday and was doing OK. She states that on Sunday and Monday patient had stomach pains and was just really tired. Donah Driver states that she was informed that this is expected at patient's age. She reports that patient started the Cipro and Flagyl on Monday. She states that yesterday patient ate some toast and eggs and was okay. She reports that patient has been having a lack of appetite but has been drinking enough. She is tolerating ginger ale. She states that she tried to give patient mashed potatoes for lunch yesterday and she immediately vomited, the same thing happened when she tried to give her applesauce for dinner. She talked with patient's PCP who sent in Zofran which she has not started yet. She also reports that patient had 3-4 episodes of watery diarrhea yesterday, small amounts. She states that this morning patient had a large amount of watery diarrhea. Denies any fever or blood. She states that patient has been moving around without assistance. Rosette Reveal that the antibiotics may be causing stomach upset if patient is not eating much, advised that she can try to supplement with Boost, Ensure, or protein shakes/smoothies. Advised that she go ahead and start Zofran as directed. She is not currently on any antidiarrheals. Advised that she can use Imodium as needed. Please advise in regards to further recommendations, thanks.

## 2020-09-26 NOTE — Telephone Encounter (Signed)
Patient returning missed call.. Plz advise  thanks

## 2020-09-26 NOTE — Progress Notes (Signed)
The proposed treatment discussed in conference is for discussion purpose only and is not a binding recommendation.  The patients have not been physically examined, or presented with their treatment options.  Therefore, final treatment plans cannot be decided.  

## 2020-09-26 NOTE — Telephone Encounter (Signed)
Note reviewed chart reviewed I am familiar with this patient I did her ERCP  #1 discontinue Cipro and metronidazole  #2 brat diet and force fluids  #3 it is okay to use some Imodium to help with diarrhea  #4 if the watery diarrhea does not resolve by tomorrow we need to do stool test for C. Difficile  #5 if she cannot maintain hydration and continues to be ill or worse then ER evaluation is the likely next step and they should not hesitate to go if concerned

## 2020-09-26 NOTE — Progress Notes (Signed)
Ashley Peters   [phone] [phone]   Clinic Follow up Note   Patient Care Team: Dettinger, [name], MD as PCP - General (Family Medicine) Zadie Rhine Clent Demark, MD as Consulting Physician (Ophthalmology) Donnie Mesa, MD as Consulting Physician (General Surgery) Truitt Merle, MD as Consulting Physician (Hematology)  Date of Service:  09/26/2020  CHIEF COMPLAINT: f/u of right colon cancer  SUMMARY OF ONCOLOGIC HISTORY: Oncology History Overview Note  Cancer Staging Cancer of right colon [facility]) Staging form: Colon and Rectum, AJCC 8th Edition - Pathologic stage from 10/14/2016: Stage IIC (pT4b, pN0, cM0) - Signed by Truitt Merle, MD on 11/07/2016      Cancer of right colon ([place])  09/19/2016 Imaging   CT A/P 09/19/16 IMPRESSION: Large 9.5 cm intraluminal mass in the cecum and ascending colon, consistent with colon carcinoma. Tumor extension into adjacent pericolonic fat seen along the lateral wall.   Mild right pericolonic and mesenteric lymphadenopathy, suspicious for metastatic disease. No other sites of metastatic disease identified.   Colonic diverticulosis, without radiographic evidence of diverticulitis. Tiny hiatal hernia, and small epigastric ventral hernia containing only fat.   Aortic atherosclerosis.    10/14/2016 Initial Diagnosis   Cancer of right colon ([place])    10/14/2016 Surgery   OPEN RIGHT HEMICOLECTOMY and REPAIR OF EPIGASTRIC VENTRAL HERNIA by Dr. Georgette Dover and Dr. Dalbert Batman     10/14/2016 Pathology Results   10/14/16 Diagnosis Colon, segmental resection for tumor, Right ADENOCARCINOMA WITH EXTENSIVE EXTRA CELLULAR MUCIN, GRADE 1, (11.0 CM) THE TUMOR INVADES THROUGH THE CECUM WALL INTO ADJACENT TERMINAL ILEUM (PT4B) NINETEEN BENIGN LYMPH NODES (0/19) SUPPURATIVE INFLAMMATION WITH FIBROSIS AND ADHESION TO ABDOMINAL WALL NO ADENOCARCINOMA IDENTIFIED    11/19/2016 Imaging   CT Chest WO Contrast 11/19/16 IMPRESSION: 1. No evidence  of metastatic disease. 2. Aortic atherosclerosis (ICD10-170.0). Coronary artery calcification.    11/25/2016 - 03/24/2017 Chemotherapy   Adjuvant Xeloda two weeks on, one week off starting 11/25/16 for 3 months     12/21/2016 Genetic Testing   Patient had genetic testing due to a personal history of colon cancer and a family history of breast, uterine, and colon cancer. The Common Hereditary Cancers Panel was ordered.  The Hereditary Gene Panel offered by [facility] includes sequencing and/or deletion duplication testing of the following 47 genes: APC, ATM, AXIN2, BARD1, BMPR1A, BRCA1, BRCA2, BRIP1, CDH1, CDKN2A (p14ARF), CDKN2A (p16INK4a), CHEK2, CDK4, CTNNA1, DICER1, EPCAM (Deletion/duplication testing only), GREM1 (promoter region deletion/duplication testing only), KIT, MEN1, MLH1, MSH2, MSH3, MSH6, MUTYH, NBN, NF1, NHTL1, PALB2, PDGFRA, PMS2, POLD1, POLE, PTEN, RAD50, RAD51C, RAD51D, SDHB, SDHC, SDHD, SMAD4, SMARCA4. STK11, TP53, TSC1, TSC2, and VHL.  The following genes were evaluated for sequence changes only: SDHA and HOXB13 c.251G>A variant only.  Results: Negative- no pathogenic variants identified.  The date of this test report is 12/21/2016.     04/27/2017 Imaging   CT AP W Contrast 04/27/17 IMPRESSION: Status post right hemicolectomy. No evidence of recurrent or metastatic disease. Additional stable ancillary findings as above.    08/07/2017 Imaging   08/07/2017 DEXA ASSESSMENT: The BMD measured at Forearm Radius 33% is 0.543 g/cm2 with a T-score of -3.9. This patient is considered osteoporotic according to [facility]) criteria.    11/25/2017 Imaging    11/25/2017 CT CAP IMPRESSION: 1. Abnormal appearance of the right lobe of the liver, with development of progressive peripheral intrahepatic duct dilatation with ill definition of the more central right hepatic duct. This appearance is indeterminate. Considerations include an  incidental primary liver lesion such  as cholangiocarcinoma, an otherwise occult liver metastasis with secondary biliary duct dilatation, or infectious/inflammatory cholangitis. If this 87 year old can undergo MRI/MRCP, this should be considered. Multiphase CT (with delayed phase imaging to evaluate for possible cholangiocarcinoma) and/or focused ultrasound would be alternate imaging strategies. 2. Otherwise, no evidence of metastatic disease in the chest, abdomen, or pelvis. 3. Coronary artery atherosclerosis. Aortic Atherosclerosis (ICD10-I70.0).   12/10/2017 Imaging   12/10/2017 MRI Abdomen IMPRESSION: 1. Stricturing of the central bile ducts of segment V RIGHT hepatic lobe without clear lesion identified. Differential would include benign and malignant stricturing including cholangiocarcinoma. Evaluation is hampered by clip artifact from cholecystectomy clips and duodenum gas. FDG PET scan may or may not be helpful in identify lesion. ERCP would be a second option to evaluate this central obstruction. 2. No enhancing lesion the in the liver parenchyma parenchyma. Altered perfusion related to the biliary obstruction.    12/23/2017 Procedure   12/23/2017 Colonoscopy Impressions: - Decreased sphincter tone found on digital rectal exam. - Two 3 mm polyps in the rectum and in the descending colon, removed with a cold snare. Resected and retrieved. - One 8 mm polyp in the rectum, removed with a hot snare. Resected and retrieved. - Diverticulosis in the entire examined colon. - The examination was otherwise normal on direct and retroflexion views.   08/24/2018 Imaging   CT AP  IMPRESSION: 1. Over the last 9 months there has been a similar not significantly progressive appearance of biliary dilatation and complexity in segment 5 of the liver along with mild intrahepatic biliary dilatation elsewhere, and moderate extrahepatic biliary dilatation. Some of this is likely postinflammatory; the most complex and dilated bile  duct did not have definite enhancement on the recent MRI to definitively indicate cholangiocarcinoma or metastatic lesion but the appearance of the right hepatic lobe is progressive from 09/19/2016 and from 04/27/2017. In the context of the patient's colon cancer, nuclear medicine PET-CT may provide some helpful adjunct information given this unusual appearance. 2. Other imaging findings of potential clinical significance: Mitral calcification. Aortoiliac atherosclerotic vascular disease. Renal cysts. Trace amount of gas in the urinary bladder, query recent catheterization. Descending and sigmoid colon diverticulosis. Two ventral hernias are enlarged compared to the prior exam but lax with wide ostium. No overt pathologic adenopathy currently.   08/24/2019 Imaging   CT CAP w contrast  IMPRESSION: 1. Right hemicolectomy. No evidence of recurrent or metastatic disease. 2. Intrahepatic/extrahepatic biliary ductal dilatation, unchanged from 11/25/2017. 3. Multiple midline supraumbilical ventral hernias contain unobstructed bowel or fat. 4. Aortic atherosclerosis (ICD10-I70.0). Coronary artery calcification.   09/20/2020 Imaging   MRI Abdomen  IMPRESSION: 1. Irregular peripheral enhancement and progressive enhancement of the lesion along the hepatic margin suspicious for peribiliary spread of colonic neoplasm (metastatic disease from colon primary) given findings on prior studies versus is cholangiocarcinoma. 2. Central biliary cast and stones filling the dilated common bile duct with diffuse biliary duct dilation. No visible enhancement to suggest tumor in these areas though assessment is limited due to clip artifact. Tumor extending into the RIGHT hepatic duct is difficult to exclude given the presence of clip artifact but on image 26 of series 33 there is suggestion of enhancing material within the lumen. 3. Slight increase in size of celiac lymph nodes dating back to 2021,  suspicious in light of other findings. No retroperitoneal adenopathy.      CURRENT THERAPY:  Surveillance  INTERVAL HISTORY:  Ashley Peters is here for a  follow up of her recent hospitalization.  She was last seen by me on 02/24/20. She presents to the clinic accompanied by her daughter.  She was admitted to hospital on September 19, 2020, for intermittent chest and abdominal pain, nausea, and hyperbilirubinemia.  MRCP showed multiple stones in common bile duct, and irregular peripheral Lehan cement and progressive enhancement of the lesion along the hepatic margin suspicious for malignancy.  She underwent ERCP and had a multiple CBD stone removed by Dr. Arelia Longest. She was discharged after that.  Her abdominal pain has resolved now, still feels weak overall. She developed more fatigue and stomach upset since she started the oral antibiotics. Her appetite is still low, eating small meals, but had a few episodes of nausea, vomiting and diarrhea in the past few days.     All other systems were reviewed with the patient and are negative.  MEDICAL HISTORY:  Past Medical History:  Diagnosis Date   Anemia    Blood transfusion without reported diagnosis    Cancer ([place])    Colon cancer ([place]) dx'd 10/2016   Diverticulosis    Family history of breast cancer    Family history of colon cancer    Family history of uterine cancer    GERD (gastroesophageal reflux disease)    History of kidney stones    Hyperlipidemia    Hypertension    Macular degeneration    Osteopenia     SURGICAL HISTORY: Past Surgical History:  Procedure Laterality Date   ABDOMINAL HYSTERECTOMY     ablation tr Great saphenous vein  10/17/2009   BREAST EXCISIONAL BIOPSY Bilateral 1971   brest biopsy  1971   CHOLECYSTECTOMY     EPIGASTRIC HERNIA REPAIR N/A 10/14/2016   Procedure: REPAIR OF EPIGASTRIC VENTRAL HERNIA;  Surgeon: Donnie Mesa, MD;  Location: [facility];  Service: General;  Laterality: N/A;   ERCP N/A 09/21/2020    Procedure: ENDOSCOPIC RETROGRADE CHOLANGIOPANCREATOGRAPHY (ERCP);  Surgeon: Gatha Mayer, MD;  Location: [facility];  Service: Endoscopy;  Laterality: N/A;   lt. distal ureteral stone extraction  09/14/1991   PARTIAL COLECTOMY Right 10/14/2016   Procedure: OPEN RIGHT HEMICOLECTOMY;  Surgeon: Donnie Mesa, MD;  Location: [facility];  Service: General;  Laterality: Right;   REMOVAL OF STONES  09/21/2020   Procedure: REMOVAL OF STONES;  Surgeon: Gatha Mayer, MD;  Location: [facility];  Service: Endoscopy;;   SPHINCTEROTOMY  09/21/2020   Procedure: [name] Mayans;  Surgeon: Gatha Mayer, MD;  Location: [facility];  Service: Endoscopy;;   STONE EXTRACTION WITH BASKET  09/21/2020   Procedure: STONE EXTRACTION WITH BASKET;  Surgeon: Gatha Mayer, MD;  Location: [facility];  Service: Endoscopy;;    I have reviewed the social history and family history with the patient and they are unchanged from previous note.  ALLERGIES:  is allergic to benicar [olmesartan medoxomil] and amoxicillin.  MEDICATIONS:  Current Outpatient Medications  Medication Sig Dispense Refill   acetaminophen (TYLENOL) 500 MG tablet Take 2 tablets (1,000 mg total) by mouth every 6 (six) hours as needed for moderate pain. 30 tablet 0   alendronate (FOSAMAX) 70 MG tablet Take with a full glass of water on an empty stomach. 12 tablet 3   ciprofloxacin (CIPRO) 500 MG tablet Take 1 tablet (500 mg total) by mouth 2 (two) times daily for 5 days. 10 tablet 0   ferrous sulfate 325 (65 FE) MG tablet Take 325 mg by mouth every Monday.     metroNIDAZOLE (FLAGYL) 500  MG tablet Take 1 tablet (500 mg total) by mouth 3 (three) times daily for 5 days. 15 tablet 0   Multiple Vitamins-Minerals (PRESERVISION AREDS 2 PO) Take 1 capsule by mouth 2 (two) times daily.     ondansetron (ZOFRAN-ODT) 4 MG disintegrating tablet Take 1 tablet (4 mg total) by mouth every 8 (eight) hours as needed for nausea or vomiting. 20 tablet 0   Current  Facility-Administered Medications  Medication Dose Route Frequency Provider Last Rate Last Admin   cyanocobalamin ((VITAMIN B-12)) injection 1,000 mcg  1,000 mcg Intramuscular Q30 days Dettinger, [name], MD   1,000 mcg at 09/12/20 1123    PHYSICAL EXAMINATION: ECOG PERFORMANCE STATUS: 2 - Symptomatic, <50% confined to bed  Vitals:   09/26/20 1228  BP: 119/76  Pulse: 88  Resp: 17  Temp: 98.6 F (37 C)  SpO2: 97%   Filed Weights   09/26/20 1228  Weight: 122 lb 11.2 oz (55.7 kg)    GENERAL:alert, no distress and comfortable SKIN: skin color, texture, turgor are normal, no rashes or significant lesions EYES: normal, Conjunctiva are pink and non-injected, sclera clear  NECK: supple, thyroid normal size, non-tender, without nodularity LYMPH:  no palpable lymphadenopathy in the cervical, axillary  LUNGS: clear to auscultation and percussion with normal breathing effort HEART: regular rate & rhythm and no murmurs and no lower extremity edema ABDOMEN:abdomen soft, non-tender and normal bowel sounds Musculoskeletal:no cyanosis of digits and no clubbing  NEURO: alert & oriented x 3 with fluent speech, no focal motor/sensory deficits  LABORATORY DATA:  I have reviewed the data as listed CBC Latest Ref Rng & Units 09/22/2020 09/21/2020 09/20/2020  WBC 4.0 - 10.5 K/uL 7.8 14.5(H) 19.8(H)  Hemoglobin 12.0 - 15.0 g/dL 11.3(L) 12.0 13.8  Hematocrit 36.0 - 46.0 % 35.3(L) 37.9 42.3  Platelets 150 - 400 K/uL 126(L) 161 204     CMP Latest Ref Rng & Units 09/26/2020 09/22/2020 09/21/2020  Glucose 70 - 99 mg/dL 114(H) 150(H) 115(H)  BUN 8 - 23 mg/dL 15 50(H) 42(H)  Creatinine 0.44 - 1.00 mg/dL 0.87 1.43(H) 1.63(H)  Sodium 135 - 145 mmol/L 138 136 136  Potassium 3.5 - 5.1 mmol/L 3.4(L) 4.9 4.7  Chloride 98 - 111 mmol/L 101 105 102  CO2 22 - 32 mmol/L 20(L) 24 26  Calcium 8.9 - 10.3 mg/dL 9.2 7.9(L) 8.3(L)  Total Protein 6.5 - 8.1 g/dL 6.4(L) 5.7(L) 5.6(L)  Total Bilirubin 0.3 - 1.2 mg/dL 1.1  2.0(H) 4.3(H)  Alkaline Phos 38 - 126 U/L 152(H) 231(H) 265(H)  AST 15 - 41 U/L 17 47(H) 130(H)  ALT 0 - 44 U/L 38 155(H) 239(H)      RADIOGRAPHIC STUDIES: I have personally reviewed the radiological images as listed and agreed with the findings in the report. No results found.   ASSESSMENT & PLAN:  Ashley Peters is a 87 y.o. female with   1. Recent CBD stone, cholecystitis, and a liver lesion on MRI. --She presented to the ED on 09/19/20 with chest pain that radiated down into her abdomen/pelvis. She was admitted and underwent abdomen MRI the following day. This showed: enhancement of lesion along hepatic margin; central biliary case and stones filling the dilated common bile duct with diffuse biliary duct dilation; slight increase in size of celiac lymph nodes, no retroperitonealadenopathy.  -Status post ERCP and multiple stone removal, she is still on oral antibiotics Cipro and Flagyl -Labs showed resolved hyperbilirubinemia, transaminitis also resolved -We discussed her recent CT and MRI images  in our GI tumor board this morning, the peripheral right lobe liver lesion with enhancement are suspicious for malignancy, but infection/abscess is also a possibility given her recent biliary stone and infection.  We discussed fine-needle biopsy versus close follow-up by repeat imaging in 2 months.  Given her advanced age, we feel it is reasonable to observe for now, and repeat MRI in 2 months.  Patient and her daughter are in agreement. -If she has recurrent symptoms, we will probably repeat imaging sooner.  2. Cancer of the right colon, invasive adenocarcinoma, G1, pT4bN0Mx stage IIA, MSI-stable  -Diagnosed in 09/2016. Genetic testing was negative. Treated with right hemicolectomy and adjuvant Xeloda. Currently on surveillance. -She had a colonoscopy on 12/23/2017 with a few polyps removed. Due to her advanced age, no further surveillance colonoscopy was recommended.  -CT CAP from 08/24/19 showed  no evidence of recurrent or metastatic disease. She does have dilation of bile duct which is stable and likely not relater to her cancer. She does have Multiple midline supraumbilical ventral hernias from surgery. I do not think hernia repair surgery is indicated at this time, will monitor.  -She is 4 years out from initial diagnosis, risk of recurrence is very low now. -I doubt her recent liver lesion on MRI is related to her previous colon cancer, will monitor closely.    3 Anemia and B12 deficiency  -She currently receives monthly B12 injections with PCP.  -Anemia previously resolved, mild anemia again likely related to recent infection     Plan  -Recent CT and MRI scan reviewed, we recommend repeating abdominal MRI in 2 months to follow-up her liver lesion -I will see her back in a month, will order scan next visit    No problem-specific Assessment & Plan notes found for this encounter.   No orders of the defined types were placed in this encounter.  All questions were answered. The patient knows to call the clinic with any problems, questions or concerns. No barriers to learning was detected. The total time spent in the appointment was 30 minutes.     Truitt Merle, MD 09/26/2020   I, Wilburn Mylar, am acting as scribe for Truitt Merle, MD.   I have reviewed the above documentation for accuracy and completeness, and I agree with the above.

## 2020-09-26 NOTE — Telephone Encounter (Signed)
Agree with those recommendations.  - HD

## 2020-09-28 ENCOUNTER — Inpatient Hospital Stay: Payer: Medicare Other | Admitting: Nurse Practitioner

## 2020-10-02 ENCOUNTER — Telehealth: Payer: Self-pay | Admitting: Family Medicine

## 2020-10-02 ENCOUNTER — Telehealth: Payer: Self-pay

## 2020-10-02 NOTE — Telephone Encounter (Signed)
This nurse spoke with patients daughter, request to know lab results and any recommendations from 7/20.  This nurse spoke with Cira Rue, NP who advised me to inform her that she reviewed patients labs no cause for alarm at this time. Patients liver enzymes continue to improve following recent hospitalization. CEA is normal. no iron deficiency. B12 is elevated but it is ok to continue injections. Advised to encourage her to eat potassium and protein-rich diet and hydrate.  Informed of next lab and f/up with Dr. Burr Medico 8/24, or sooner if needed.  Daughter acknowledged understanding.  No further questions or concerns at this time.

## 2020-10-03 ENCOUNTER — Telehealth: Payer: Self-pay | Admitting: Hematology

## 2020-10-03 NOTE — Telephone Encounter (Signed)
Spoke with patients daughter. Appointment made on 10/17/2020 at 2:10pm with Dr. Warrick Parisian for hospital follow up

## 2020-10-03 NOTE — Telephone Encounter (Signed)
Left message with rescheduled upcoming appointment due to provider's breast clinic. 

## 2020-10-05 ENCOUNTER — Other Ambulatory Visit: Payer: Self-pay

## 2020-10-05 ENCOUNTER — Ambulatory Visit (INDEPENDENT_AMBULATORY_CARE_PROVIDER_SITE_OTHER): Payer: Medicare Other | Admitting: Family Medicine

## 2020-10-05 ENCOUNTER — Encounter: Payer: Self-pay | Admitting: Family Medicine

## 2020-10-05 ENCOUNTER — Ambulatory Visit (INDEPENDENT_AMBULATORY_CARE_PROVIDER_SITE_OTHER): Payer: Medicare Other

## 2020-10-05 VITALS — BP 129/68 | HR 87 | Ht 60.0 in | Wt 124.0 lb

## 2020-10-05 DIAGNOSIS — M79672 Pain in left foot: Secondary | ICD-10-CM

## 2020-10-05 NOTE — Patient Instructions (Signed)

## 2020-10-05 NOTE — Progress Notes (Signed)
Acute Office Visit  Subjective:    Patient ID: Ashley Peters, female    DOB: [birth year], 87 y.o.   MRN: [id number]  Chief Complaint  Patient presents with   Foot Pain    Left    HPI Patient is in today for left foot pain x 3 days. The pain is located on the top of her foot. It is an achy pain. It is intermittent. The pain is 8-10 with standing, 0 with rest. The pain occurs with weight bearing and with movement. The pain is better with rest. Her ankle and foot is swollen. Denies injury, warmth, or erythema. She has tried tylenol once. She was on cipro a few weeks ago while she was in the hospital for an infection of her gallbladder. She denies ankle pain or feeling a pop in her ankle. Denies fever. She has been using a walker for ambulation at home.   Past Medical History:  Diagnosis Date   Anemia    Blood transfusion without reported diagnosis    Cancer ([place])    Colon cancer ([place]) dx'd 10/2016   Diverticulosis    Family history of breast cancer    Family history of colon cancer    Family history of uterine cancer    GERD (gastroesophageal reflux disease)    History of kidney stones    Hyperlipidemia    Hypertension    Macular degeneration    Osteopenia     Past Surgical History:  Procedure Laterality Date   ABDOMINAL HYSTERECTOMY     ablation tr Great saphenous vein  10/17/2009   BREAST EXCISIONAL BIOPSY Bilateral 1971   brest biopsy  1971   CHOLECYSTECTOMY     EPIGASTRIC HERNIA REPAIR N/A 10/14/2016   Procedure: REPAIR OF EPIGASTRIC VENTRAL HERNIA;  Surgeon: Donnie Mesa, MD;  Location: [facility];  Service: General;  Laterality: N/A;   ERCP N/A 09/21/2020   Procedure: ENDOSCOPIC RETROGRADE CHOLANGIOPANCREATOGRAPHY (ERCP);  Surgeon: Gatha Mayer, MD;  Location: [facility];  Service: Endoscopy;  Laterality: N/A;   lt. distal ureteral stone extraction  09/14/1991   PARTIAL COLECTOMY Right 10/14/2016   Procedure: OPEN RIGHT HEMICOLECTOMY;  Surgeon: Donnie Mesa, MD;   Location: [facility];  Service: General;  Laterality: Right;   REMOVAL OF STONES  09/21/2020   Procedure: REMOVAL OF STONES;  Surgeon: Gatha Mayer, MD;  Location: [facility];  Service: Endoscopy;;   SPHINCTEROTOMY  09/21/2020   Procedure: [name] Mayans;  Surgeon: Gatha Mayer, MD;  Location: [facility];  Service: Endoscopy;;   STONE EXTRACTION WITH BASKET  09/21/2020   Procedure: STONE EXTRACTION WITH BASKET;  Surgeon: Gatha Mayer, MD;  Location: [facility];  Service: Endoscopy;;    Family History  Problem Relation Age of Onset   Stroke Father 81   Breast cancer Sister        dx 59's, died at 75   Seizures Son    Diabetes Brother    Kidney disease Brother    Heart disease Brother    Epilepsy Son    Colon cancer Other 57   Uterine cancer Other 33   Hodgkin's lymphoma Other    Breast cancer Other     Social History   Socioeconomic History   Marital status: Widowed    Spouse name: Not on file   Number of children: 3   Years of education: 50   Highest education level: Some college, no degree  Occupational History   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  Tobacco Use   Smoking status: Never   Smokeless tobacco: Never  Vaping Use   Vaping Use: Never used  Substance and Sexual Activity   Alcohol use: No   Drug use: No   Sexual activity: Not Currently    Birth control/protection: None  Other Topics Concern   Not on file  Social History Narrative   Not on file   Social Determinants of Health   Financial Resource Strain: Low Risk    Difficulty of Paying Living Expenses: Not hard at all  Food Insecurity: No Food Insecurity   Worried About Charity fundraiser in the Last Year: Never true   [place] in the Last Year: Never true  Transportation Needs: No Transportation Needs   Lack of Transportation (Medical): No   Lack of Transportation (Non-Medical): No  Physical Activity: Insufficiently Active   Days of Exercise per Week: 7 days   Minutes of Exercise  per Session: 10 min  Stress: No Stress Concern Present   Feeling of Stress : Only a little  Social Connections: Moderately Integrated   Frequency of Communication with Friends and Family: More than three times a week   Frequency of Social Gatherings with Friends and Family: More than three times a week   Attends Religious Services: More than 4 times per year   Active Member of [employer] or Organizations: Yes   Attends Archivist Meetings: 1 to 4 times per year   Marital Status: Widowed  Intimate Partner Violence: Not on file    Outpatient Medications Prior to Visit  Medication Sig Dispense Refill   acetaminophen (TYLENOL) 500 MG tablet Take 2 tablets (1,000 mg total) by mouth every 6 (six) hours as needed for moderate pain. 30 tablet 0   alendronate (FOSAMAX) 70 MG tablet Take with a full glass of water on an empty stomach. 12 tablet 3   Multiple Vitamins-Minerals (PRESERVISION AREDS 2 PO) Take 1 capsule by mouth 2 (two) times daily.     ondansetron (ZOFRAN-ODT) 4 MG disintegrating tablet Take 1 tablet (4 mg total) by mouth every 8 (eight) hours as needed for nausea or vomiting. 20 tablet 0   ferrous sulfate 325 (65 FE) MG tablet Take 325 mg by mouth every Monday.     Facility-Administered Medications Prior to Visit  Medication Dose Route Frequency Provider Last Rate Last Admin   cyanocobalamin ((VITAMIN B-12)) injection 1,000 mcg  1,000 mcg Intramuscular Q30 days Dettinger, [name], MD   1,000 mcg at 09/12/20 1123    Allergies  Allergen Reactions   Benicar [Olmesartan Medoxomil] Other (See Comments)    Elevated Potassium   Amoxicillin Rash    Has patient had a PCN reaction causing immediate rash, facial/tongue/throat swelling, SOB or lightheadedness with hypotension: Yes Has patient had a PCN reaction causing severe rash involving mucus membranes or skin necrosis: No Has patient had a PCN reaction that required hospitalization: No Has patient had a PCN reaction occurring  within the last 10 years: No If all of the above answers are "NO", then may proceed with Cephalosporin use.    Review of Systems As per HPI.     Objective:    Physical Exam Vitals and nursing note reviewed.  Constitutional:      Appearance: Normal appearance.  Pulmonary:     Effort: Pulmonary effort is normal. No respiratory distress.     Breath sounds: Normal breath sounds.  Musculoskeletal:     Left ankle: Swelling present. No  deformity. No tenderness. Normal range of motion. Anterior drawer test negative.     Left Achilles Tendon: Normal.     Left foot: Normal range of motion and normal capillary refill. Swelling and tenderness (dorsal) present. No deformity, laceration or crepitus. Normal pulse.       Legs:     Comments: Tenderness to palpation  Skin:    General: Skin is warm and dry.  Neurological:     Mental Status: She is alert and oriented to person, place, and time.     Gait: Gait abnormal (in wheelchair).  Psychiatric:        Mood and Affect: Mood normal.        Behavior: Behavior normal.    BP 129/68   Pulse 87   Ht 5' (1.524 m)   Wt 124 lb (56.2 kg)   SpO2 97%   BMI 24.22 kg/m  Wt Readings from Last 3 Encounters:  10/05/20 124 lb (56.2 kg)  09/26/20 122 lb 11.2 oz (55.7 kg)  09/21/20 124 lb (56.2 kg)    There are no preventive care reminders to display for this patient.  There are no preventive care reminders to display for this patient.   Lab Results  Component Value Date   TSH 2.350 09/19/2016   Lab Results  Component Value Date   WBC 7.8 09/22/2020   HGB 11.3 (L) 09/22/2020   HCT 35.3 (L) 09/22/2020   MCV 93.1 09/22/2020   PLT 126 (L) 09/22/2020   Lab Results  Component Value Date   NA 138 09/26/2020   K 3.4 (L) 09/26/2020   CHLORIDE 108 03/09/2017   CO2 20 (L) 09/26/2020   GLUCOSE 114 (H) 09/26/2020   BUN 15 09/26/2020   CREATININE 0.87 09/26/2020   BILITOT 1.1 09/26/2020   ALKPHOS 152 (H) 09/26/2020   AST 17 09/26/2020   ALT  38 09/26/2020   PROT 6.4 (L) 09/26/2020   ALBUMIN 3.2 (L) 09/26/2020   CALCIUM 9.2 09/26/2020   ANIONGAP 17 (H) 09/26/2020   EGFR 69 08/23/2020   Lab Results  Component Value Date   CHOL 171 08/23/2020   Lab Results  Component Value Date   HDL 63 08/23/2020   Lab Results  Component Value Date   LDLCALC 86 08/23/2020   Lab Results  Component Value Date   TRIG 128 08/23/2020   Lab Results  Component Value Date   CHOLHDL 2.7 08/23/2020   Lab Results  Component Value Date   HGBA1C 5.7 02/04/2016       Assessment & Plan:   Liliyana was seen today for foot pain.  Diagnoses and all orders for this visit:  Acute foot pain, left No known injury. Swelling with dorsal tenderness today. Xray today in office, radiology reports pending. Tylenol for pain. Rest, ice, compression, elevated. Will notify patient of xray result. Return to office for new or worsening symptoms, or if symptoms persist.  -     DG Foot Complete Left; Future  The patient indicates understanding of these issues and agrees with the plan.  Gwenlyn Perking, FNP

## 2020-10-15 ENCOUNTER — Other Ambulatory Visit: Payer: Self-pay

## 2020-10-15 ENCOUNTER — Ambulatory Visit (INDEPENDENT_AMBULATORY_CARE_PROVIDER_SITE_OTHER): Payer: Medicare Other

## 2020-10-15 DIAGNOSIS — E538 Deficiency of other specified B group vitamins: Secondary | ICD-10-CM

## 2020-10-15 NOTE — Progress Notes (Signed)
Cyanocobalamin injection given to left deltoid.  Patient tolerated well. 

## 2020-10-17 ENCOUNTER — Ambulatory Visit (INDEPENDENT_AMBULATORY_CARE_PROVIDER_SITE_OTHER): Payer: Medicare Other | Admitting: Family Medicine

## 2020-10-17 ENCOUNTER — Other Ambulatory Visit: Payer: Self-pay

## 2020-10-17 ENCOUNTER — Encounter: Payer: Self-pay | Admitting: Family Medicine

## 2020-10-17 VITALS — BP 136/80 | HR 88 | Ht 60.0 in | Wt 116.0 lb

## 2020-10-17 DIAGNOSIS — K838 Other specified diseases of biliary tract: Secondary | ICD-10-CM

## 2020-10-17 NOTE — Progress Notes (Signed)
BP 136/80   Pulse 88   Ht 5' (1.524 m)   Wt 116 lb (52.6 kg)   SpO2 98%   BMI 22.65 kg/m    Subjective:   Patient ID: Ashley Peters, female    DOB: May 09, 1928, 87 y.o.   MRN: [id number]  HPI: Ashley Peters is a 87 y.o. female presenting on 10/17/2020 for Hospitalization Follow-up (ERCP) and Foot Swelling (Left. Has improved since last visit)   HPI Patient is coming in for hospital follow-up.  Patient was admitted to the hospital on 09/19/2020 and discharged on 09/22/2020 with back pain that ended up coming back as bad duct dilation due to gallstone.  She had already previously had her gallbladder removed 20 years ago. Patient had nausea and diarrhea on antibiotic.  She did not do well on antibiotic.  She stopped antibiotic.  She is staying hydrated and abdominal pain is gone.    She had swelling and foot pain but it is improved.  Foot is no longer painful.   Relevant past medical, surgical, family and social history reviewed and updated as indicated. Interim medical history since our last visit reviewed. Allergies and medications reviewed and updated.  Review of Systems  Constitutional:  Negative for chills and fever.  Eyes:  Negative for visual disturbance.  Respiratory:  Negative for chest tightness and shortness of breath.   Cardiovascular:  Negative for chest pain and leg swelling.  Genitourinary:  Negative for difficulty urinating and dysuria.  Musculoskeletal:  Negative for back pain and gait problem.  Skin:  Negative for rash.  Neurological:  Negative for light-headedness and headaches.  Psychiatric/Behavioral:  Negative for agitation and behavioral problems.   All other systems reviewed and are negative.  Per HPI unless specifically indicated above   Allergies as of 10/17/2020       Reactions   Benicar [olmesartan Medoxomil] Other (See Comments)   Elevated Potassium   Amoxicillin Rash   Has patient had a PCN reaction causing immediate rash, facial/tongue/throat  swelling, SOB or lightheadedness with hypotension: Yes Has patient had a PCN reaction causing severe rash involving mucus membranes or skin necrosis: No Has patient had a PCN reaction that required hospitalization: No Has patient had a PCN reaction occurring within the last 10 years: No If all of the above answers are "NO", then may proceed with Cephalosporin use.        Medication List        Accurate as of October 17, 2020  2:39 PM. If you have any questions, ask your nurse or doctor.          STOP taking these medications    ondansetron 4 MG disintegrating tablet Commonly known as: ZOFRAN-ODT Stopped by: [name] Georgie Eduardo, MD       TAKE these medications    acetaminophen 500 MG tablet Commonly known as: TYLENOL Take 2 tablets (1,000 mg total) by mouth every 6 (six) hours as needed for moderate pain.   alendronate 70 MG tablet Commonly known as: FOSAMAX Take with a full glass of water on an empty stomach.   PRESERVISION AREDS 2 PO Take 1 capsule by mouth 2 (two) times daily.         Objective:   BP 136/80   Pulse 88   Ht 5' (1.524 m)   Wt 116 lb (52.6 kg)   SpO2 98%   BMI 22.65 kg/m   Wt Readings from Last 3 Encounters:  10/17/20 116 lb (52.6 kg)  10/05/20  124 lb (56.2 kg)  09/26/20 122 lb 11.2 oz (55.7 kg)    Physical Exam Vitals and nursing note reviewed.  Constitutional:      General: She is not in acute distress.    Appearance: She is well-developed. She is not diaphoretic.  Eyes:     Conjunctiva/sclera: Conjunctivae normal.  Cardiovascular:     Rate and Rhythm: Normal rate and regular rhythm.     Heart sounds: Normal heart sounds. No murmur heard. Pulmonary:     Effort: Pulmonary effort is normal. No respiratory distress.     Breath sounds: Normal breath sounds. No wheezing.  Abdominal:     General: Abdomen is flat. Bowel sounds are normal. There is no distension.     Tenderness: There is no abdominal tenderness. There is no right CVA  tenderness, left CVA tenderness, guarding or rebound.  Musculoskeletal:        General: No tenderness. Normal range of motion.  Skin:    General: Skin is warm and dry.     Findings: No rash.  Neurological:     Mental Status: She is alert and oriented to person, place, and time.     Coordination: Coordination normal.  Psychiatric:        Behavior: Behavior normal.      Assessment & Plan:   Problem List Items Addressed This Visit   None Visit Diagnoses     Common bile duct dilatation    -  Primary   Relevant Orders   CBC with Differential/Platelet   CMP14+EGFR       Patient seems to be doing well, continue with routine follow-up, also has follow-up with her gastroenterology. Follow up plan: Return if symptoms worsen or fail to improve.  Counseling provided for all of the vaccine components Orders Placed This Encounter  Procedures   CBC with Differential/Platelet   [place] Nikoletta Varma, MD [facility] 10/17/2020, 2:39 PM

## 2020-10-18 LAB — CBC WITH DIFFERENTIAL/PLATELET
Basophils Absolute: 0.1 10*3/uL (ref 0.0–0.2)
Basos: 1 %
EOS (ABSOLUTE): 0.1 10*3/uL (ref 0.0–0.4)
Eos: 1 %
Hematocrit: 40.6 % (ref 34.0–46.6)
Hemoglobin: 13.1 g/dL (ref 11.1–15.9)
Immature Grans (Abs): 0.1 10*3/uL (ref 0.0–0.1)
Immature Granulocytes: 1 %
Lymphocytes Absolute: 0.9 10*3/uL (ref 0.7–3.1)
Lymphs: 16 %
MCH: 28.2 pg (ref 26.6–33.0)
MCHC: 32.3 g/dL (ref 31.5–35.7)
MCV: 88 fL (ref 79–97)
Monocytes Absolute: 0.4 10*3/uL (ref 0.1–0.9)
Monocytes: 8 %
Neutrophils Absolute: 4 10*3/uL (ref 1.4–7.0)
Neutrophils: 73 %
Platelets: 353 10*3/uL (ref 150–450)
RBC: 4.64 x10E6/uL (ref 3.77–5.28)
RDW: 12.9 % (ref 11.7–15.4)
WBC: 5.6 10*3/uL (ref 3.4–10.8)

## 2020-10-18 LAB — CMP14+EGFR
ALT: 35 IU/L — ABNORMAL HIGH (ref 0–32)
AST: 52 IU/L — ABNORMAL HIGH (ref 0–40)
Albumin/Globulin Ratio: 1.6 (ref 1.2–2.2)
Albumin: 4.1 g/dL (ref 3.5–4.6)
Alkaline Phosphatase: 256 IU/L — ABNORMAL HIGH (ref 44–121)
BUN/Creatinine Ratio: 21 (ref 12–28)
BUN: 18 mg/dL (ref 10–36)
Bilirubin Total: 0.5 mg/dL (ref 0.0–1.2)
CO2: 27 mmol/L (ref 20–29)
Calcium: 10 mg/dL (ref 8.7–10.3)
Chloride: 104 mmol/L (ref 96–106)
Creatinine, Ser: 0.86 mg/dL (ref 0.57–1.00)
Globulin, Total: 2.5 g/dL (ref 1.5–4.5)
Glucose: 97 mg/dL (ref 65–99)
Potassium: 5.4 mmol/L — ABNORMAL HIGH (ref 3.5–5.2)
Sodium: 142 mmol/L (ref 134–144)
Total Protein: 6.6 g/dL (ref 6.0–8.5)
eGFR: 64 mL/min/{1.73_m2} (ref >59)

## 2020-10-23 ENCOUNTER — Encounter (INDEPENDENT_AMBULATORY_CARE_PROVIDER_SITE_OTHER): Payer: Self-pay | Admitting: Ophthalmology

## 2020-10-23 ENCOUNTER — Other Ambulatory Visit: Payer: Self-pay

## 2020-10-23 ENCOUNTER — Ambulatory Visit (INDEPENDENT_AMBULATORY_CARE_PROVIDER_SITE_OTHER): Payer: Medicare Other | Admitting: Ophthalmology

## 2020-10-23 DIAGNOSIS — H353114 Nonexudative age-related macular degeneration, right eye, advanced atrophic with subfoveal involvement: Secondary | ICD-10-CM

## 2020-10-23 DIAGNOSIS — H353221 Exudative age-related macular degeneration, left eye, with active choroidal neovascularization: Secondary | ICD-10-CM | POA: Diagnosis not present

## 2020-10-23 DIAGNOSIS — H353124 Nonexudative age-related macular degeneration, left eye, advanced atrophic with subfoveal involvement: Secondary | ICD-10-CM | POA: Diagnosis not present

## 2020-10-23 DIAGNOSIS — H353222 Exudative age-related macular degeneration, left eye, with inactive choroidal neovascularization: Secondary | ICD-10-CM

## 2020-10-23 DIAGNOSIS — H353212 Exudative age-related macular degeneration, right eye, with inactive choroidal neovascularization: Secondary | ICD-10-CM | POA: Diagnosis not present

## 2020-10-23 NOTE — Assessment & Plan Note (Signed)
Geographic atrophy extensive, counts are acuity, also low risk of developing CNVM with the extent of atrophy present

## 2020-10-23 NOTE — Assessment & Plan Note (Signed)
No signs of recurrence OD

## 2020-10-23 NOTE — Progress Notes (Signed)
10/23/2020     CHIEF COMPLAINT Patient presents for Retina Follow Up   HISTORY OF PRESENT ILLNESS: Ashley Peters is a 87 y.o. female who presents to the clinic today for:   HPI     Retina Follow Up           Diagnosis: Wet AMD   Laterality: both eyes   Onset: 3 months ago   Severity: mild   Duration: 3 months   Course: stable         Comments   3 month fu oct and fp Pt states [facility] OU stable since last visit. Pt denies FOL, floaters, or ocular pain OU.        Last edited by Kendra Opitz, COA on 10/23/2020  8:25 AM.      Referring physician: Dettinger, [name], MD [place],  [place] [id number]  HISTORICAL INFORMATION:   Selected notes from the MEDICAL RECORD NUMBER    Lab Results  Component Value Date   HGBA1C 5.7 02/04/2016     CURRENT MEDICATIONS: No current outpatient medications on file. (Ophthalmic Drugs)   No current facility-administered medications for this visit. (Ophthalmic Drugs)   Current Outpatient Medications (Other)  Medication Sig   acetaminophen (TYLENOL) 500 MG tablet Take 2 tablets (1,000 mg total) by mouth every 6 (six) hours as needed for moderate pain.   alendronate (FOSAMAX) 70 MG tablet Take with a full glass of water on an empty stomach.   Multiple Vitamins-Minerals (PRESERVISION AREDS 2 PO) Take 1 capsule by mouth 2 (two) times daily.   Current Facility-Administered Medications (Other)  Medication Route   cyanocobalamin ((VITAMIN B-12)) injection 1,000 mcg Intramuscular      REVIEW OF SYSTEMS:    ALLERGIES Allergies  Allergen Reactions   Benicar [Olmesartan Medoxomil] Other (See Comments)    Elevated Potassium   Amoxicillin Rash    Has patient had a PCN reaction causing immediate rash, facial/tongue/throat swelling, SOB or lightheadedness with hypotension: Yes Has patient had a PCN reaction causing severe rash involving mucus membranes or skin necrosis: No Has patient had a PCN reaction that required  hospitalization: No Has patient had a PCN reaction occurring within the last 10 years: No If all of the above answers are "NO", then may proceed with Cephalosporin use.    PAST MEDICAL HISTORY Past Medical History:  Diagnosis Date   Anemia    Blood transfusion without reported diagnosis    Cancer ([place])    Colon cancer ([place]) dx'd 10/2016   Cystoid macular edema of left eye 07/05/2019   Diverticulosis    Family history of breast cancer    Family history of colon cancer    Family history of uterine cancer    GERD (gastroesophageal reflux disease)    History of kidney stones    Hyperlipidemia    Hypertension    Macular degeneration    Osteopenia    Past Surgical History:  Procedure Laterality Date   ABDOMINAL HYSTERECTOMY     ablation tr Great saphenous vein  10/17/2009   BREAST EXCISIONAL BIOPSY Bilateral 1971   brest biopsy  1971   CHOLECYSTECTOMY     EPIGASTRIC HERNIA REPAIR N/A 10/14/2016   Procedure: REPAIR OF EPIGASTRIC VENTRAL HERNIA;  Surgeon: Donnie Mesa, MD;  Location: [facility];  Service: General;  Laterality: N/A;   ERCP N/A 09/21/2020   Procedure: ENDOSCOPIC RETROGRADE CHOLANGIOPANCREATOGRAPHY (ERCP);  Surgeon: Gatha Mayer, MD;  Location: [facility];  Service: Endoscopy;  Laterality: N/A;   lt. distal ureteral stone extraction  09/14/1991   PARTIAL COLECTOMY Right 10/14/2016   Procedure: OPEN RIGHT HEMICOLECTOMY;  Surgeon: Donnie Mesa, MD;  Location: [facility];  Service: General;  Laterality: Right;   REMOVAL OF STONES  09/21/2020   Procedure: REMOVAL OF STONES;  Surgeon: Gatha Mayer, MD;  Location: [facility];  Service: Endoscopy;;   SPHINCTEROTOMY  09/21/2020   Procedure: SPHINCTEROTOMY;  Surgeon: Gatha Mayer, MD;  Location: [facility];  Service: Endoscopy;;   STONE EXTRACTION WITH BASKET  09/21/2020   Procedure: STONE EXTRACTION WITH BASKET;  Surgeon: Gatha Mayer, MD;  Location: [facility];  Service: Endoscopy;;    FAMILY HISTORY Family History   Problem Relation Age of Onset   Stroke Father 78   Breast cancer Sister        dx 43's, died at 14   Seizures Son    Diabetes Brother    Kidney disease Brother    Heart disease Brother    Epilepsy Son    Colon cancer Other 57   Uterine cancer Other 48   Hodgkin's lymphoma Other    Breast cancer Other     SOCIAL HISTORY Social History   Tobacco Use   Smoking status: Never   Smokeless tobacco: Never  Vaping Use   Vaping Use: Never used  Substance Use Topics   Alcohol use: No   Drug use: No         OPHTHALMIC EXAM:  Base Eye Exam     Visual Acuity (ETDRS)       Right Left   Dist cc CF at 3' 20/400 ecc   Dist ph cc NI NI         Tonometry (Tonopen, 8:29 AM)       Right Left   Pressure 15 12         Pupils       Pupils Dark Light Shape React APD   Right PERRL 5 4 Round Brisk None   Left PERRL 5 4 Round Brisk None         Visual Fields (Counting fingers)       Left Right    Full Full         Extraocular Movement       Right Left    Full Full         Neuro/Psych     Oriented x3: Yes   Mood/Affect: Normal         Dilation     Both eyes: 1.0% Mydriacyl, 2.5% Phenylephrine @ 8:29 AM           Slit Lamp and Fundus Exam     External Exam       Right Left   External Normal Normal         Slit Lamp Exam       Right Left   Lids/Lashes Normal Normal   Conjunctiva/Sclera White and quiet White and quiet   Cornea Clear Clear   Anterior Chamber Deep and quiet Deep and quiet   Iris Round and reactive Round and reactive   Lens Posterior chamber intraocular lens Posterior chamber intraocular lens   Anterior Vitreous Normal Normal         Fundus Exam       Right Left   Posterior Vitreous Posterior vitreous detachment Normal   Disc Normal Normal   C/D Ratio 0.45 0.5   Macula Retinal pigment epithelial mottling, Hard drusen, Geographic atrophy  into the FAZ, Advanced age related macular degeneration, Geographic atrophy  encompasses the FAZ approximately 5 DA size, no macular thickening, no hemorrhage, no exudates, Disciform scar Retinal pigment epithelial mottling, Hard drusen, Pigmented atrophy, Geographic atrophy, now with subfoveal atrophy approximately 5.5 DA size   Vessels Normal Normal   Periphery Normal Normal            IMAGING AND PROCEDURES  Imaging and Procedures for 10/23/20  OCT, Retina - OU - Both Eyes       Right Eye Quality was good. Scan locations included subfoveal. Central Foveal Thickness: 235. Progression has been stable. Findings include abnormal foveal contour, no SRF, no IRF, outer retinal atrophy, central retinal atrophy, subretinal hyper-reflective material.   Left Eye Quality was good. Scan locations included subfoveal. Central Foveal Thickness: 249. Progression has been stable. Findings include abnormal foveal contour, outer retinal atrophy, central retinal atrophy, subretinal scarring, subretinal hyper-reflective material.   Notes Extensive outer retinal atrophy, with loss of photoreceptor layer centrally in each eye with no signs of intraretinal fluid or perimacular extension of CNVM in either eye. No signs of CNVM in either eye     Color Fundus Photography Optos - OU - Both Eyes       Right Eye Progression has been stable. Disc findings include normal observations. Macula : geographic atrophy, retinal pigment epithelium abnormalities. Vessels : normal observations. Periphery : normal observations.   Left Eye Progression has been stable. Disc findings include normal observations. Macula : geographic atrophy, retinal pigment epithelium abnormalities. Vessels : normal observations. Periphery : normal observations.   Notes OD with extensive geographic atrophy as well as no signs of CNVM.  OS also with geographic atrophy approximately 5-6 disc areas in extent .  No signs of CNVM OS             ASSESSMENT/PLAN:  Advanced nonexudative age-related macular  degeneration of left eye with subfoveal involvement Geographic atrophy extensive, counts are acuity, also low risk of developing CNVM with the extent of atrophy present  Advanced nonexudative age-related macular degeneration of right eye with subfoveal involvement Geographic atrophy extensive, counts are acuity, also low risk of developing CNVM with the extent of atrophy present  Exudative age-related macular degeneration of left eye with inactive choroidal neovascularization (HCC) No signs of recurrence  Exudative age-related macular degeneration of right eye with inactive choroidal neovascularization (HCC) No signs of recurrence OD     ICD-10-CM   1. Exudative age-related macular degeneration of left eye with active choroidal neovascularization (HCC)  H35.3221 OCT, Retina - OU - Both Eyes    Color Fundus Photography Optos - OU - Both Eyes    2. Exudative age-related macular degeneration of right eye with inactive choroidal neovascularization (HCC)  H35.3212 OCT, Retina - OU - Both Eyes    Color Fundus Photography Optos - OU - Both Eyes    3. Advanced nonexudative age-related macular degeneration of left eye with subfoveal involvement  H35.3124     4. Advanced nonexudative age-related macular degeneration of right eye with subfoveal involvement  H35.3114     5. Exudative age-related macular degeneration of left eye with inactive choroidal neovascularization ([place])  H35.3222       1.  Geographic atrophy OU, no signs of recurrence of CNVM in either eye.  We will continue to observe  2.  3.  Ophthalmic Meds Ordered this visit:  No orders of the defined types were placed in this encounter.      Return  in about 6 months (around 04/25/2021) for DILATE OU, COLOR FP, OCT.  There are no Patient Instructions on file for this visit.   Explained the diagnoses, plan, and follow up with the patient and they expressed understanding.  Patient expressed understanding of the importance of  proper follow up care.   Clent Demark Jaydn Moscato M.D. Diseases & Surgery of the Retina and Vitreous [facility] [place] 10/23/20     Abbreviations: M myopia (nearsighted); A astigmatism; H hyperopia (farsighted); P presbyopia; Mrx spectacle prescription;  CTL contact lenses; OD right eye; OS left eye; OU both eyes  XT exotropia; ET esotropia; PEK punctate epithelial keratitis; PEE punctate epithelial erosions; DES dry eye syndrome; MGD meibomian gland dysfunction; ATs artificial tears; PFAT's preservative free artificial tears; [place] nuclear sclerotic cataract; PSC posterior subcapsular cataract; ERM epi-retinal membrane; PVD posterior vitreous detachment; RD retinal detachment; DM diabetes mellitus; DR diabetic retinopathy; NPDR non-proliferative diabetic retinopathy; PDR proliferative diabetic retinopathy; CSME clinically significant macular edema; DME diabetic macular edema; dbh dot blot hemorrhages; CWS cotton wool spot; POAG primary open angle glaucoma; C/D cup-to-disc ratio; HVF humphrey visual field; GVF goldmann visual field; OCT optical coherence tomography; IOP intraocular pressure; BRVO Branch retinal vein occlusion; CRVO central retinal vein occlusion; CRAO central retinal artery occlusion; BRAO branch retinal artery occlusion; RT retinal tear; SB scleral buckle; PPV pars plana vitrectomy; VH Vitreous hemorrhage; PRP panretinal laser photocoagulation; IVK intravitreal kenalog; VMT vitreomacular traction; MH Macular hole;  NVD neovascularization of the disc; NVE neovascularization elsewhere; AREDS age related eye disease study; ARMD age related macular degeneration; POAG primary open angle glaucoma; EBMD epithelial/anterior basement membrane dystrophy; ACIOL anterior chamber intraocular lens; IOL intraocular lens; PCIOL posterior chamber intraocular lens; Phaco/IOL phacoemulsification with intraocular lens placement; [place] photorefractive keratectomy; LASIK laser assisted in situ keratomileusis;  HTN hypertension; DM diabetes mellitus; COPD chronic obstructive pulmonary disease

## 2020-10-23 NOTE — Assessment & Plan Note (Signed)
No signs of recurrence 

## 2020-10-31 ENCOUNTER — Other Ambulatory Visit: Payer: Medicare Other

## 2020-10-31 ENCOUNTER — Ambulatory Visit: Payer: Medicare Other | Admitting: Hematology

## 2020-11-01 ENCOUNTER — Other Ambulatory Visit: Payer: Medicare Other

## 2020-11-01 ENCOUNTER — Ambulatory Visit: Payer: Medicare Other | Admitting: Hematology

## 2020-11-02 ENCOUNTER — Encounter: Payer: Self-pay | Admitting: Hematology

## 2020-11-02 ENCOUNTER — Inpatient Hospital Stay (HOSPITAL_BASED_OUTPATIENT_CLINIC_OR_DEPARTMENT_OTHER): Payer: Medicare Other | Admitting: Hematology

## 2020-11-02 ENCOUNTER — Other Ambulatory Visit: Payer: Self-pay

## 2020-11-02 ENCOUNTER — Inpatient Hospital Stay: Payer: Medicare Other | Attending: Hematology

## 2020-11-02 VITALS — BP 148/80 | HR 79 | Temp 97.9°F | Resp 17 | Ht 60.0 in | Wt 118.5 lb

## 2020-11-02 DIAGNOSIS — D649 Anemia, unspecified: Secondary | ICD-10-CM | POA: Diagnosis not present

## 2020-11-02 DIAGNOSIS — Z85038 Personal history of other malignant neoplasm of large intestine: Secondary | ICD-10-CM | POA: Insufficient documentation

## 2020-11-02 DIAGNOSIS — D500 Iron deficiency anemia secondary to blood loss (chronic): Secondary | ICD-10-CM

## 2020-11-02 DIAGNOSIS — C182 Malignant neoplasm of ascending colon: Secondary | ICD-10-CM | POA: Diagnosis not present

## 2020-11-02 DIAGNOSIS — E538 Deficiency of other specified B group vitamins: Secondary | ICD-10-CM | POA: Insufficient documentation

## 2020-11-02 LAB — COMPREHENSIVE METABOLIC PANEL
ALT: 10 U/L (ref 0–44)
AST: 15 U/L (ref 15–41)
Albumin: 3.7 g/dL (ref 3.5–5.0)
Alkaline Phosphatase: 73 U/L (ref 38–126)
Anion gap: 7 (ref 5–15)
BUN: 18 mg/dL (ref 8–23)
CO2: 26 mmol/L (ref 22–32)
Calcium: 9.5 mg/dL (ref 8.9–10.3)
Chloride: 109 mmol/L (ref 98–111)
Creatinine, Ser: 0.81 mg/dL (ref 0.44–1.00)
GFR, Estimated: >60 mL/min (ref >60)
Glucose, Bld: 107 mg/dL — ABNORMAL HIGH (ref 70–99)
Potassium: 4.9 mmol/L (ref 3.5–5.1)
Sodium: 142 mmol/L (ref 135–145)
Total Bilirubin: 0.6 mg/dL (ref 0.3–1.2)
Total Protein: 6.8 g/dL (ref 6.5–8.1)

## 2020-11-02 LAB — CBC WITH DIFFERENTIAL/PLATELET
Abs Immature Granulocytes: 0.03 10*3/uL (ref 0.00–0.07)
Basophils Absolute: 0.1 10*3/uL (ref 0.0–0.1)
Basophils Relative: 1 %
Eosinophils Absolute: 0.1 10*3/uL (ref 0.0–0.5)
Eosinophils Relative: 1 %
HCT: 41 % (ref 36.0–46.0)
Hemoglobin: 13.3 g/dL (ref 12.0–15.0)
Immature Granulocytes: 1 %
Lymphocytes Relative: 12 %
Lymphs Abs: 0.7 10*3/uL (ref 0.7–4.0)
MCH: 29.1 pg (ref 26.0–34.0)
MCHC: 32.4 g/dL (ref 30.0–36.0)
MCV: 89.7 fL (ref 80.0–100.0)
Monocytes Absolute: 0.3 10*3/uL (ref 0.1–1.0)
Monocytes Relative: 6 %
Neutro Abs: 4.6 10*3/uL (ref 1.7–7.7)
Neutrophils Relative %: 79 %
Platelets: 195 10*3/uL (ref 150–400)
RBC: 4.57 MIL/uL (ref 3.87–5.11)
RDW: 14.1 % (ref 11.5–15.5)
WBC: 5.8 10*3/uL (ref 4.0–10.5)
nRBC: 0 % (ref 0.0–0.2)

## 2020-11-02 LAB — CEA (IN HOUSE-CHCC): CEA (CHCC-In House): 3.86 ng/mL (ref 0.00–5.00)

## 2020-11-02 LAB — FERRITIN: Ferritin: 174 ng/mL (ref 11–307)

## 2020-11-02 NOTE — Progress Notes (Signed)
[place]   [phone]) [phone] Fax:([phone]) [phone]   Clinic Follow up Note   Patient Care Team: Dettinger, [name], MD as PCP - General (Family Medicine) Zadie Rhine Clent Demark, MD as Consulting Physician (Ophthalmology) Donnie Mesa, MD as Consulting Physician (General Surgery) Truitt Merle, MD as Consulting Physician (Hematology)  Date of Service:  11/02/2020  CHIEF COMPLAINT: f/u of right colon cancer  ASSESSMENT & PLAN:  Ashley Peters is a 87 y.o. female with   1. Recent CBD stone, cholecystitis, and a liver lesion on MRI. --She presented to the ED on 09/19/20 with chest pain that radiated down into her abdomen/pelvis. She was admitted and underwent abdomen MRI the following day. This showed: enhancement of lesion along hepatic margin; central biliary case and stones filling the dilated common bile duct with diffuse biliary duct dilation; slight increase in size of celiac lymph nodes, no retroperitonealadenopathy.  -Status post ERCP and multiple stone removal -Labs showed resolved hyperbilirubinemia, transaminitis also improved  -We will obtain repeat MRI in 3-4 weeks, with f/u a week later.   2. Cancer of the right colon, invasive adenocarcinoma, G1, pT4bN0Mx stage IIA, MSI-stable  -Diagnosed in 09/2016. Genetic testing was negative. Treated with right hemicolectomy and adjuvant Xeloda. Currently on surveillance. -She had a colonoscopy on 12/23/2017 with a few polyps removed. Due to her advanced age, no further surveillance colonoscopy was recommended.  -CT CAP from 08/24/19 showed no evidence of recurrent or metastatic disease. She does have dilation of bile duct which is stable and likely not relater to her cancer. She does have Multiple midline supraumbilical ventral hernias from surgery. I do not think hernia repair surgery is indicated at this time, will monitor.  -She is 4 years out from initial diagnosis, risk of recurrence is very low now.    3 Anemia and B12  deficiency  -She currently receives monthly B12 injections with PCP.  -Anemia resolved     Plan  -Repeat abdomen MRI in 3-4 weeks. -f/u a week after scan   No problem-specific Assessment & Plan notes found for this encounter.   SUMMARY OF ONCOLOGIC HISTORY: Oncology History Overview Note  Cancer Staging Cancer of right colon [facility]) Staging form: Colon and Rectum, AJCC 8th Edition - Pathologic stage from 10/14/2016: Stage IIC (pT4b, pN0, cM0) - Signed by Truitt Merle, MD on 11/07/2016     Cancer of right colon ([place])  09/19/2016 Imaging   CT A/P 09/19/16 IMPRESSION: Large 9.5 cm intraluminal mass in the cecum and ascending colon, consistent with colon carcinoma. Tumor extension into adjacent pericolonic fat seen along the lateral wall.   Mild right pericolonic and mesenteric lymphadenopathy, suspicious for metastatic disease. No other sites of metastatic disease identified.   Colonic diverticulosis, without radiographic evidence of diverticulitis. Tiny hiatal hernia, and small epigastric ventral hernia containing only fat.   Aortic atherosclerosis.   10/14/2016 Initial Diagnosis   Cancer of right colon ([place])   10/14/2016 Surgery   OPEN RIGHT HEMICOLECTOMY and REPAIR OF EPIGASTRIC VENTRAL HERNIA by Dr. Georgette Dover and Dr. Dalbert Batman    10/14/2016 Pathology Results   10/14/16 Diagnosis Colon, segmental resection for tumor, Right ADENOCARCINOMA WITH EXTENSIVE EXTRA CELLULAR MUCIN, GRADE 1, (11.0 CM) THE TUMOR INVADES THROUGH THE CECUM WALL INTO ADJACENT TERMINAL ILEUM (PT4B) NINETEEN BENIGN LYMPH NODES (0/19) SUPPURATIVE INFLAMMATION WITH FIBROSIS AND ADHESION TO ABDOMINAL WALL NO ADENOCARCINOMA IDENTIFIED   11/19/2016 Imaging   CT Chest WO Contrast 11/19/16 IMPRESSION: 1. No evidence of metastatic disease. 2. Aortic atherosclerosis (ICD10-170.0).  Coronary artery calcification.   11/25/2016 - 03/24/2017 Chemotherapy   Adjuvant Xeloda two weeks on, one week off starting 11/25/16 for 3  months     12/21/2016 Genetic Testing   Patient had genetic testing due to a personal history of colon cancer and a family history of breast, uterine, and colon cancer. The Common Hereditary Cancers Panel was ordered.  The Hereditary Gene Panel offered by [facility] includes sequencing and/or deletion duplication testing of the following 47 genes: APC, ATM, AXIN2, BARD1, BMPR1A, BRCA1, BRCA2, BRIP1, CDH1, CDKN2A (p14ARF), CDKN2A (p16INK4a), CHEK2, CDK4, CTNNA1, DICER1, EPCAM (Deletion/duplication testing only), GREM1 (promoter region deletion/duplication testing only), KIT, MEN1, MLH1, MSH2, MSH3, MSH6, MUTYH, NBN, NF1, NHTL1, PALB2, PDGFRA, PMS2, POLD1, POLE, PTEN, RAD50, RAD51C, RAD51D, SDHB, SDHC, SDHD, SMAD4, SMARCA4. STK11, TP53, TSC1, TSC2, and VHL.  The following genes were evaluated for sequence changes only: SDHA and HOXB13 c.251G>A variant only.  Results: Negative- no pathogenic variants identified.  The date of this test report is 12/21/2016.    04/27/2017 Imaging   CT AP W Contrast 04/27/17 IMPRESSION: Status post right hemicolectomy. No evidence of recurrent or metastatic disease. Additional stable ancillary findings as above.   08/07/2017 Imaging   08/07/2017 DEXA ASSESSMENT: The BMD measured at Forearm Radius 33% is 0.543 g/cm2 with a T-score of -3.9. This patient is considered osteoporotic according to [facility]) criteria.   11/25/2017 Imaging    11/25/2017 CT CAP IMPRESSION: 1. Abnormal appearance of the right lobe of the liver, with development of progressive peripheral intrahepatic duct dilatation with ill definition of the more central right hepatic duct. This appearance is indeterminate. Considerations include an incidental primary liver lesion such as cholangiocarcinoma, an otherwise occult liver metastasis with secondary biliary duct dilatation, or infectious/inflammatory cholangitis. If this 87 year old can undergo MRI/MRCP, this should be considered.  Multiphase CT (with delayed phase imaging to evaluate for possible cholangiocarcinoma) and/or focused ultrasound would be alternate imaging strategies. 2. Otherwise, no evidence of metastatic disease in the chest, abdomen, or pelvis. 3. Coronary artery atherosclerosis. Aortic Atherosclerosis (ICD10-I70.0).   12/10/2017 Imaging   12/10/2017 MRI Abdomen IMPRESSION: 1. Stricturing of the central bile ducts of segment V RIGHT hepatic lobe without clear lesion identified. Differential would include benign and malignant stricturing including cholangiocarcinoma. Evaluation is hampered by clip artifact from cholecystectomy clips and duodenum gas. FDG PET scan may or may not be helpful in identify lesion. ERCP would be a second option to evaluate this central obstruction. 2. No enhancing lesion the in the liver parenchyma parenchyma. Altered perfusion related to the biliary obstruction.   12/23/2017 Procedure   12/23/2017 Colonoscopy Impressions: - Decreased sphincter tone found on digital rectal exam. - Two 3 mm polyps in the rectum and in the descending colon, removed with a cold snare. Resected and retrieved. - One 8 mm polyp in the rectum, removed with a hot snare. Resected and retrieved. - Diverticulosis in the entire examined colon. - The examination was otherwise normal on direct and retroflexion views.   08/24/2018 Imaging   CT AP  IMPRESSION: 1. Over the last 9 months there has been a similar not significantly progressive appearance of biliary dilatation and complexity in segment 5 of the liver along with mild intrahepatic biliary dilatation elsewhere, and moderate extrahepatic biliary dilatation. Some of this is likely postinflammatory; the most complex and dilated bile duct did not have definite enhancement on the recent MRI to definitively indicate cholangiocarcinoma or metastatic lesion but the appearance of the right hepatic lobe is  progressive from 09/19/2016 and from  04/27/2017. In the context of the patient's colon cancer, nuclear medicine PET-CT may provide some helpful adjunct information given this unusual appearance. 2. Other imaging findings of potential clinical significance: Mitral calcification. Aortoiliac atherosclerotic vascular disease. Renal cysts. Trace amount of gas in the urinary bladder, query recent catheterization. Descending and sigmoid colon diverticulosis. Two ventral hernias are enlarged compared to the prior exam but lax with wide ostium. No overt pathologic adenopathy currently.   08/24/2019 Imaging   CT CAP w contrast  IMPRESSION: 1. Right hemicolectomy. No evidence of recurrent or metastatic disease. 2. Intrahepatic/extrahepatic biliary ductal dilatation, unchanged from 11/25/2017. 3. Multiple midline supraumbilical ventral hernias contain unobstructed bowel or fat. 4. Aortic atherosclerosis (ICD10-I70.0). Coronary artery calcification.   09/20/2020 Imaging   MRI Abdomen  IMPRESSION: 1. Irregular peripheral enhancement and progressive enhancement of the lesion along the hepatic margin suspicious for peribiliary spread of colonic neoplasm (metastatic disease from colon primary) given findings on prior studies versus is cholangiocarcinoma. 2. Central biliary cast and stones filling the dilated common bile duct with diffuse biliary duct dilation. No visible enhancement to suggest tumor in these areas though assessment is limited due to clip artifact. Tumor extending into the RIGHT hepatic duct is difficult to exclude given the presence of clip artifact but on image 26 of series 33 there is suggestion of enhancing material within the lumen. 3. Slight increase in size of celiac lymph nodes dating back to 2021, suspicious in light of other findings. No retroperitoneal adenopathy.      CURRENT THERAPY:  Surveillance  INTERVAL HISTORY:  Ashley Peters is here for a follow up of colon cancer. She was last seen by me on  09/26/20. She presents to the clinic accompanied by her daughter. Her daughter reports she is doing much better than her prior visit. Ashley Peters denies any stomach or GI issues and notes she is eating normally. Daughter notes Ashley Peters had right-sided LE swelling following discharge from the hospital. This is improved today.   All other systems were reviewed with the patient and are negative.  MEDICAL HISTORY:  Past Medical History:  Diagnosis Date   Anemia    Blood transfusion without reported diagnosis    Cancer ([place])    Colon cancer ([place]) dx'd 10/2016   Cystoid macular edema of left eye 07/05/2019   Diverticulosis    Family history of breast cancer    Family history of colon cancer    Family history of uterine cancer    GERD (gastroesophageal reflux disease)    History of kidney stones    Hyperlipidemia    Hypertension    Macular degeneration    Osteopenia     SURGICAL HISTORY: Past Surgical History:  Procedure Laterality Date   ABDOMINAL HYSTERECTOMY     ablation tr Great saphenous vein  10/17/2009   BREAST EXCISIONAL BIOPSY Bilateral 1971   brest biopsy  1971   CHOLECYSTECTOMY     EPIGASTRIC HERNIA REPAIR N/A 10/14/2016   Procedure: REPAIR OF EPIGASTRIC VENTRAL HERNIA;  Surgeon: Donnie Mesa, MD;  Location: [place];  Service: General;  Laterality: N/A;   ERCP N/A 09/21/2020   Procedure: ENDOSCOPIC RETROGRADE CHOLANGIOPANCREATOGRAPHY (ERCP);  Surgeon: Gatha Mayer, MD;  Location: [facility];  Service: Endoscopy;  Laterality: N/A;   lt. distal ureteral stone extraction  09/14/1991   PARTIAL COLECTOMY Right 10/14/2016   Procedure: OPEN RIGHT HEMICOLECTOMY;  Surgeon: Donnie Mesa, MD;  Location: [place];  Service: General;  Laterality: Right;  REMOVAL OF STONES  09/21/2020   Procedure: REMOVAL OF STONES;  Surgeon: Gatha Mayer, MD;  Location: [facility];  Service: Endoscopy;;   SPHINCTEROTOMY  09/21/2020   Procedure: [name] Mayans;  Surgeon: Gatha Mayer, MD;  Location: [facility];  Service: Endoscopy;;   STONE EXTRACTION WITH BASKET  09/21/2020   Procedure: STONE EXTRACTION WITH BASKET;  Surgeon: Gatha Mayer, MD;  Location: [facility];  Service: Endoscopy;;    I have reviewed the social history and family history with the patient and they are unchanged from previous note.  ALLERGIES:  is allergic to benicar [olmesartan medoxomil] and amoxicillin.  MEDICATIONS:  Current Outpatient Medications  Medication Sig Dispense Refill   acetaminophen (TYLENOL) 500 MG tablet Take 2 tablets (1,000 mg total) by mouth every 6 (six) hours as needed for moderate pain. 30 tablet 0   alendronate (FOSAMAX) 70 MG tablet Take with a full glass of water on an empty stomach. 12 tablet 3   Multiple Vitamins-Minerals (PRESERVISION AREDS 2 PO) Take 1 capsule by mouth 2 (two) times daily.     Current Facility-Administered Medications  Medication Dose Route Frequency Provider Last Rate Last Admin   cyanocobalamin ((VITAMIN B-12)) injection 1,000 mcg  1,000 mcg Intramuscular Q30 days Dettinger, [name], MD   1,000 mcg at 10/15/20 8768    PHYSICAL EXAMINATION: ECOG PERFORMANCE STATUS: 1 - Symptomatic but completely ambulatory  Vitals:   11/02/20 0832  BP: (!) 148/80  Pulse: 79  Resp: 17  Temp: 97.9 F (36.6 C)  SpO2: 98%   Wt Readings from Last 3 Encounters:  11/02/20 118 lb 8 oz (53.8 kg)  10/17/20 116 lb (52.6 kg)  10/05/20 124 lb (56.2 kg)     GENERAL:alert, no distress and comfortable SKIN: skin color, texture, turgor are normal, no rashes or significant lesions EYES: normal, Conjunctiva are pink and non-injected, sclera clear  NECK: supple, thyroid normal size, non-tender, without nodularity LYMPH:  no palpable lymphadenopathy in the cervical, axillary  LUNGS: clear to auscultation and percussion with normal breathing effort HEART: regular rate & rhythm and no murmurs and no lower extremity edema ABDOMEN:abdomen soft, non-tender and normal bowel  sounds Musculoskeletal:no cyanosis of digits and no clubbing  NEURO: alert & oriented x 3 with fluent speech, no focal motor/sensory deficits  LABORATORY DATA:  I have reviewed the data as listed CBC Latest Ref Rng & Units 11/02/2020 10/17/2020 09/22/2020  WBC 4.0 - 10.5 K/uL 5.8 5.6 7.8  Hemoglobin 12.0 - 15.0 g/dL 13.3 13.1 11.3(L)  Hematocrit 36.0 - 46.0 % 41.0 40.6 35.3(L)  Platelets 150 - 400 K/uL 195 353 126(L)     CMP Latest Ref Rng & Units 11/02/2020 10/17/2020 09/26/2020  Glucose 70 - 99 mg/dL 107(H) 97 114(H)  BUN 8 - 23 mg/dL _0 Creatinine 0.44 - 1.00 mg/dL 0.81 0.86 0.87  Sodium 135 - 145 mmol/L 142 142 138  Potassium 3.5 - 5.1 mmol/L 4.9 5.4(H) 3.4(L)  Chloride 98 - 111 mmol/L 109 104 101  CO2 22 - 32 mmol/L 26 27 20(L)  Calcium 8.9 - 10.3 mg/dL 9.5 10.0 9.2  Total Protein 6.5 - 8.1 g/dL 6.8 6.6 6.4(L)  Total Bilirubin 0.3 - 1.2 mg/dL 0.6 0.5 1.1  Alkaline Phos 38 - 126 U/L 73 256(H) 152(H)  AST 15 - 41 U/L 15 52(H) 17  ALT 0 - 44 U/L 10 35(H) 38      RADIOGRAPHIC STUDIES: I have personally reviewed the radiological images as listed and  agreed with the findings in the report. No results found.    Orders Placed This Encounter  Procedures   MR Abdomen W Wo Contrast    Standing Status:   Future    Standing Expiration Date:   11/02/2021    Order Specific Question:   If indicated for the ordered procedure, I authorize the administration of contrast media per Radiology protocol    Answer:   Yes    Order Specific Question:   What is the patient's sedation requirement?    Answer:   No Sedation    Order Specific Question:   Does the patient have a pacemaker or implanted devices?    Answer:   No    Order Specific Question:   Preferred imaging location?    Answer:   [facility] (table limit - 550 lbs)   All questions were answered. The patient knows to call the clinic with any problems, questions or concerns. No barriers to learning was detected. The  total time spent in the appointment was 30 minutes.     Truitt Merle, MD 11/02/2020   I, Wilburn Mylar, am acting as scribe for Truitt Merle, MD.   I have reviewed the above documentation for accuracy and completeness, and I agree with the above.

## 2020-11-05 ENCOUNTER — Telehealth: Payer: Self-pay | Admitting: Hematology

## 2020-11-05 NOTE — Telephone Encounter (Signed)
Scheduled follow-up appointments per 8/26 los. Patient and patient's daughter are aware.

## 2020-11-13 ENCOUNTER — Telehealth: Payer: Self-pay | Admitting: Family Medicine

## 2020-11-13 NOTE — Telephone Encounter (Signed)
Per daughter pts home covid test is positive. Symptoms are mild cough and runny nose. Pt feels pretty well and is up and about in her home. Quarantine protocol reviewed. Advised that if antiviral is needed that it needs to be given in the first few days of symptoms starting.  Daughter will call back tomorrow which would be within the first few days time frame if pt becomes worse. Pt has been fully vaccinated and has had one booster shot.  Will make Dr. Warrick Parisian aware of conversation when he returns tomorrow to make sure he does not have any other recommendations.

## 2020-11-14 NOTE — Telephone Encounter (Signed)
Yes if she would like to discuss the antiviral and discuss treatment then please make her a virtual visit and we can discuss that

## 2020-11-14 NOTE — Telephone Encounter (Signed)
Spoke with daughter. Pt's cough and runny nose is much better. Daughter does not think that she will need treatment. She will call and check on pt in a few hours and call back if needed.

## 2020-11-15 ENCOUNTER — Ambulatory Visit: Payer: Medicare Other

## 2020-11-21 ENCOUNTER — Ambulatory Visit (INDEPENDENT_AMBULATORY_CARE_PROVIDER_SITE_OTHER): Payer: Medicare Other

## 2020-11-21 VITALS — Ht 60.0 in | Wt 116.0 lb

## 2020-11-21 DIAGNOSIS — Z0000 Encounter for general adult medical examination without abnormal findings: Secondary | ICD-10-CM

## 2020-11-21 NOTE — Progress Notes (Signed)
Subjective:   Ashley Peters is a 87 y.o. female who presents for Medicare Annual (Subsequent) preventive examination.  Virtual Visit via Telephone Note  I connected with  [name] on 11/21/20 at  9:00 AM EDT by telephone and verified that I am speaking with the correct person using two identifiers.  Location: Patient: Home Provider: [facility] Persons participating in the virtual visit: patient/Nurse Health Advisor   I discussed the limitations, risks, security and privacy concerns of performing an evaluation and management service by telephone and the availability of in person appointments. The patient expressed understanding and agreed to proceed.  Interactive audio and video telecommunications were attempted between this nurse and patient, however failed, due to patient having technical difficulties OR patient did not have access to video capability.  We continued and completed visit with audio only.  Some vital signs may be absent or patient reported.   Zanasia Hickson E Marely Apgar, LPN   Review of Systems     Cardiac Risk Factors include: advanced age (>23mn, >>52women);dyslipidemia;hypertension     Objective:    Today's Vitals   11/21/20 0914  Weight: 116 lb (52.6 kg)  Height: 5' (1.524 m)   Body mass index is 22.65 kg/m.  Advanced Directives 11/21/2020 09/21/2020 09/20/2020 09/19/2020 02/24/2020 11/21/2019 08/26/2019  Does Patient Have a Medical Advance Directive? No Yes Yes Yes No No Yes  Type of Advance Directive - Healthcare Power of ADoyle Does patient want to make changes to medical advance directive? - - No - Patient declined - - - -  Copy of HFairviewin Chart? - - - - - - -  Would patient like information on creating a medical advance directive? No - Patient declined - - - - No - Patient declined -    Current Medications (verified) Outpatient Encounter Medications as of 11/21/2020  Medication Sig   acetaminophen  (TYLENOL) 500 MG tablet Take 2 tablets (1,000 mg total) by mouth every 6 (six) hours as needed for moderate pain.   alendronate (FOSAMAX) 70 MG tablet Take with a full glass of water on an empty stomach.   Multiple Vitamins-Minerals (PRESERVISION AREDS 2 PO) Take 1 capsule by mouth 2 (two) times daily.   Facility-Administered Encounter Medications as of 11/21/2020  Medication   cyanocobalamin ((VITAMIN B-12)) injection 1,000 mcg    Allergies (verified) Benicar [olmesartan medoxomil] and Amoxicillin   History: Past Medical History:  Diagnosis Date   Anemia    Blood transfusion without reported diagnosis    Cancer ([facility]    [place] [place] 10[place]   Cystoid macular edema of left eye 07/05/2019   Diverticulosis    Family history of breast cancer    Family history of colon cancer    Family history of uterine cancer    GERD (gastroesophageal reflux disease)    History of kidney stones    Hyperlipidemia    Hypertension    Macular degeneration    Osteopenia    Past Surgical History:  Procedure Laterality Date   ABDOMINAL HYSTERECTOMY     ablation tr Great saphenous vein  10/17/2009   BREAST EXCISIONAL BIOPSY Bilateral 1971   brest biopsy  1971   CHOLECYSTECTOMY     EPIGASTRIC HERNIA REPAIR N/A 10/14/2016   Procedure: REPAIR OF EPIGASTRIC VENTRAL HERNIA;  Surgeon: TDonnie Mesa MD;  [facility]  Service: General;  Laterality: N/A;   ERCP N/A 09/21/2020   Procedure:  ENDOSCOPIC RETROGRADE CHOLANGIOPANCREATOGRAPHY (ERCP);  Surgeon: Gatha Mayer, MD;  Location: [facility];  Service: Endoscopy;  Laterality: N/A;   lt. distal ureteral stone extraction  09/14/1991   PARTIAL COLECTOMY Right 10/14/2016   Procedure: OPEN RIGHT HEMICOLECTOMY;  Surgeon: Donnie Mesa, MD;  Location: [facility];  Service: General;  Laterality: Right;   REMOVAL OF STONES  09/21/2020   Procedure: REMOVAL OF STONES;  Surgeon: Gatha Mayer, MD;  Location: [facility];  Service: Endoscopy;;    SPHINCTEROTOMY  09/21/2020   Procedure: [name] Mayans;  Surgeon: Gatha Mayer, MD;  Location: [facility];  Service: Endoscopy;;   STONE EXTRACTION WITH BASKET  09/21/2020   Procedure: STONE EXTRACTION WITH BASKET;  Surgeon: Gatha Mayer, MD;  Location: [facility];  Service: Endoscopy;;   Family History  Problem Relation Age of Onset   Stroke Father 13   Breast cancer Sister        dx 52's, died at 67   Seizures Son    Diabetes Brother    Kidney disease Brother    Heart disease Brother    Epilepsy Son    Colon cancer Other 57   Uterine cancer Other 47   Hodgkin's lymphoma Other    Breast cancer Other    Social History   Socioeconomic History   Marital status: Widowed    Spouse name: Not on file   Number of children: 3   Years of education: 51   Highest education level: Some college, no degree  Occupational History   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  Tobacco Use   Smoking status: Never   Smokeless tobacco: Never  Vaping Use   Vaping Use: Never used  Substance and Sexual Activity   Alcohol use: No   Drug use: No   Sexual activity: Not Currently    Birth control/protection: None  Other Topics Concern   Not on file  Social History Narrative   Lives alone - daughter and son live 2 miles away   Social Determinants of Health   Financial Resource Strain: Low Risk    Difficulty of Paying Living Expenses: Not hard at all  Food Insecurity: No Food Insecurity   Worried About Charity fundraiser in the Last Year: Never true   Arboriculturist in the Last Year: Never true  Transportation Needs: No Transportation Needs   Lack of Transportation (Medical): No   Lack of Transportation (Non-Medical): No  Physical Activity: Insufficiently Active   Days of Exercise per Week: 7 days   Minutes of Exercise per Session: 20 min  Stress: No Stress Concern Present   Feeling of Stress : Not at all  Social Connections: Moderately Integrated   Frequency of Communication with  Friends and Family: More than three times a week   Frequency of Social Gatherings with Friends and Family: More than three times a week   Attends Religious Services: More than 4 times per year   Active Member of [employer] or Organizations: Yes   Attends Archivist Meetings: More than 4 times per year   Marital Status: Widowed    Tobacco Counseling Counseling given: Not Answered   Clinical Intake:  Pre-visit preparation completed: Yes  Pain : No/denies pain     BMI - recorded: 22.65 Nutritional Status: BMI of 19-24  Normal Nutritional Risks: None Diabetes: No  How often do you need to have someone help you when you read instructions, pamphlets, or other written materials from your doctor  or pharmacy?: 1 - Never  Diabetic? No  Interpreter Needed?: No  Information entered by :: Antwanette Wesche, LPN   Activities of Daily Living In your present state of health, do you have any difficulty performing the following activities: 11/21/2020 09/20/2020  Hearing? N Y  Vision? N Y  Difficulty concentrating or making decisions? N N  Walking or climbing stairs? N N  Dressing or bathing? N N  Doing errands, shopping? N N  Preparing Food and eating ? N -  Using the Toilet? N -  In the past six months, have you accidently leaked urine? Y -  Comment mild - wears pads for protection when she goes out -  Do you have problems with loss of bowel control? N -  Managing your Medications? N -  Managing your Finances? N -  Housekeeping or managing your Housekeeping? N -  Some recent data might be hidden    Patient Care Team: Dettinger, [name], MD as PCP - General (Family Medicine) Zadie Rhine Clent Demark, MD as Consulting Physician (Ophthalmology) Donnie Mesa, MD as Consulting Physician (General Surgery) Truitt Merle, MD as Consulting Physician (Hematology)  Indicate any recent Medical Services you may have received from other than [facility] providers in the past year (date may be  approximate).     Assessment:   This is a routine wellness examination for Ashley Peters.  Hearing/Vision screen Hearing Screening - Comments:: Denies hearing difficulties  Vision Screening - Comments:: Wears eyeglasses prn reading - has macular degeneration - up to date with annual eye exams with Dr Katy [facility]  Dietary issues and exercise activities discussed: Current Exercise Habits: Home exercise routine, Type of exercise: walking, Time (Minutes): 20, Frequency (Times/Week): 7, Weekly Exercise (Minutes/Week): 140, Intensity: Mild, Exercise limited by: cardiac condition(s);orthopedic condition(s)   Goals Addressed             This Visit's Progress    DIET - INCREASE WATER INTAKE   Not on track    Try to drink 6-8 glasses of water daily, try harder     Exercise 3x per week (30 min per time)   On track      Depression Screen PHQ 2/9 Scores 11/21/2020 10/17/2020 10/05/2020 09/12/2020 08/23/2020 02/22/2020 11/21/2019  PHQ - 2 Score 0 0 0 0 0 0 0  PHQ- 9 Score 0 - 0 - - - -    Fall Risk Fall Risk  11/21/2020 10/17/2020 10/05/2020 09/12/2020 08/23/2020  Falls in the past year? 0 0 0 0 0  Number falls in past yr: 0 - - - -  Injury with Fall? 0 - - - -  Risk for fall due to : Impaired vision;Orthopedic patient - - - -  Follow up Falls prevention discussed - - - -  Comment - - - - -    FALL RISK PREVENTION PERTAINING TO THE HOME:  Any stairs in or around the home? Yes  If so, are there any without handrails? No  Home free of loose throw rugs in walkways, pet beds, electrical cords, etc? Yes  Adequate lighting in your home to reduce risk of falls? Yes   ASSISTIVE DEVICES UTILIZED TO PREVENT FALLS:  Life alert? No  Use of a cane, walker or w/c? No  Grab bars in the bathroom? Yes  Shower chair or bench in shower? Yes  Elevated toilet seat or a handicapped toilet? No   TIMED UP AND GO:  Was the test performed? No . Telephonic visit  Cognitive Function:  MMSE - Mini Mental State  Exam 08/06/2017 06/23/2016 12/04/2014  Orientation to time '5 5 5  '$ Orientation to Place '5 5 5  '$ Registration '3 3 3  '$ Attention/ Calculation '5 5 5  '$ Recall '2 2 2  '$ Language- name 2 objects '2 2 2  '$ Language- repeat '1 1 1  '$ Language- follow 3 step command '3 3 3  '$ Language- read & follow direction '1 1 1  '$ Write a sentence '1 1 1  '$ Copy design '1 1 1  '$ Total score '29 29 29     '$ 6CIT Screen 11/21/2020 11/21/2019 11/17/2018  What Year? 0 points 0 points 0 points  What month? 0 points 0 points 0 points  What time? 0 points 0 points 0 points  Count back from 20 0 points 0 points 0 points  Months in reverse 2 points 2 points 4 points  Repeat phrase 0 points 0 points 0 points  Total Score '2 2 4    '$ Immunizations Immunization History  Administered Date(s) Administered   DTaP 11/09/2003   Fluad Quad(high Dose 65+) 12/07/2018, 12/27/2019   Influenza Whole 12/10/2009   Influenza, High Dose Seasonal PF 12/15/2016, 12/07/2017   Influenza,inj,Quad PF,6+ Mos 12/15/2012, 12/22/2013, 12/13/2014, 12/25/2015   PFIZER(Purple Top)SARS-COV-2 Vaccination 04/16/2019, 05/10/2019, 12/13/2019   Pneumococcal Conjugate-13 06/23/2016   Pneumococcal Polysaccharide-23 03/16/2007   Tdap 08/06/2017   Zoster, Live 12/17/2011    TDAP status: Up to date  Flu Vaccine status: Up to date  Pneumococcal vaccine status: Up to date  Covid-19 vaccine status: Completed vaccines  Qualifies for Shingles Vaccine? Yes   Zostavax completed Yes   Shingrix Completed?: No.    Education has been provided regarding the importance of this vaccine. Patient has been advised to call insurance company to determine out of pocket expense if they have not yet received this vaccine. Advised may also receive vaccine at local pharmacy or Health Dept. Verbalized acceptance and understanding.  Screening Tests Health Maintenance  Topic Date Due   INFLUENZA VACCINE  10/08/2020   Zoster Vaccines- Shingrix (1 of 2) 02/22/2021 (Originally 08/26/1947)    COVID-19 Vaccine (4 - Booster for Pfizer series) 08/23/2021 (Originally 03/06/2020)   DEXA SCAN  02/23/2022   TETANUS/TDAP  08/07/2027   PNA vac Low Risk Adult  Completed   HPV VACCINES  Aged Out    Health Maintenance  Health Maintenance Due  Topic Date Due   INFLUENZA VACCINE  10/08/2020    Colorectal cancer screening: No longer required.   Mammogram status: Completed 02/15/2020. Repeat every year  Bone Density status: Completed 02/24/2020. Results reflect: Bone density results: OSTEOPOROSIS. Repeat every 2 years.  Lung Cancer Screening: (Low Dose CT Chest recommended if Age 49-80 years, 30 pack-year currently smoking OR have quit w/in 15years.) does not qualify.   Additional Screening:  Hepatitis C Screening: does not qualify  Vision Screening: Recommended annual ophthalmology exams for early detection of glaucoma and other disorders of the eye. Is the patient up to date with their annual eye exam?  Yes  Who is the provider or what is the name of the office in which the patient attends annual eye exams? [facility] If pt is not established with a provider, would they like to be referred to a provider to establish care? No .   Dental Screening: Recommended annual dental exams for proper oral hygiene  Community Resource Referral / Chronic Care Management: CRR required this visit?  No   CCM required this visit?  No  Plan:     I have personally reviewed and noted the following in the patient's chart:   Medical and social history Use of alcohol, tobacco or illicit drugs  Current medications and supplements including opioid prescriptions.  Functional ability and status Nutritional status Physical activity Advanced directives List of other physicians Hospitalizations, surgeries, and ER visits in previous 12 months Vitals Screenings to include cognitive, depression, and falls Referrals and appointments  In addition, I have reviewed and discussed with  patient certain preventive protocols, quality metrics, and best practice recommendations. A written personalized care plan for preventive services as well as general preventive health recommendations were provided to patient.     Sandrea Hammond, LPN   [id number]   Nurse Notes: None

## 2020-11-21 NOTE — Patient Instructions (Signed)
Ashley Peters , Thank you for taking time to come for your Medicare Wellness Visit. I appreciate your ongoing commitment to your health goals. Please review the following plan we discussed and let me know if I can assist you in the future.   Screening recommendations/referrals: Colonoscopy: Done 12/23/2017 - repeat not required Mammogram: Done 02/15/2020 - Repeat annually  Bone Density: Done 02/24/2020 - Repeat every 2 years  Recommended yearly ophthalmology/optometry visit for glaucoma screening and checkup Recommended yearly dental visit for hygiene and checkup  Vaccinations: Influenza vaccine: Done 12/27/2019 - Repeat annually  Pneumococcal vaccine: Done 03/16/2007 & 06/23/2016 Tdap vaccine: done 08/06/2017 - Repeat in 10 years Shingles vaccine: Zostavax done 2013 - Shingrix discussed. Please contact your pharmacy for coverage information.     Covid-19: Done 04/16/19, 05/10/19, & 12/13/2019 - check with pharmacy for second booster  Advanced directives: Advance directive discussed with you today. Even though you declined this today, please call our office should you change your mind, and we can give you the proper paperwork for you to fill out.   Conditions/risks identified: Aim for 30 minutes of exercise or brisk walking each day, drink 6-8 glasses of water and eat lots of fruits and vegetables.   Next appointment: Follow up in one year for your annual wellness visit    Preventive Care 65 Years and Older, Female Preventive care refers to lifestyle choices and visits with your health care provider that can promote health and wellness. What does preventive care include? A yearly physical exam. This is also called an annual well check. Dental exams once or twice a year. Routine eye exams. Ask your health care provider how often you should have your eyes checked. Personal lifestyle choices, including: Daily care of your teeth and gums. Regular physical activity. Eating a healthy diet. Avoiding  tobacco and drug use. Limiting alcohol use. Practicing safe sex. Taking low-dose aspirin every day. Taking vitamin and mineral supplements as recommended by your health care provider. What happens during an annual well check? The services and screenings done by your health care provider during your annual well check will depend on your age, overall health, lifestyle risk factors, and family history of disease. Counseling  Your health care provider may ask you questions about your: Alcohol use. Tobacco use. Drug use. Emotional well-being. Home and relationship well-being. Sexual activity. Eating habits. History of falls. Memory and ability to understand (cognition). Work and work Statistician. Reproductive health. Screening  You may have the following tests or measurements: Height, weight, and BMI. Blood pressure. Lipid and cholesterol levels. These may be checked every 5 years, or more frequently if you are over 70 years old. Skin check. Lung cancer screening. You may have this screening every year starting at age 55 if you have a 30-pack-year history of smoking and currently smoke or have quit within the past 15 years. Fecal occult blood test (FOBT) of the stool. You may have this test every year starting at age 62. Flexible sigmoidoscopy or colonoscopy. You may have a sigmoidoscopy every 5 years or a colonoscopy every 10 years starting at age 57. Hepatitis C blood test. Hepatitis B blood test. Sexually transmitted disease (STD) testing. Diabetes screening. This is done by checking your blood sugar (glucose) after you have not eaten for a while (fasting). You may have this done every 1-3 years. Bone density scan. This is done to screen for osteoporosis. You may have this done starting at age 53. Mammogram. This may be done every 1-2 years.  Talk to your health care provider about how often you should have regular mammograms. Talk with your health care provider about your test  results, treatment options, and if necessary, the need for more tests. Vaccines  Your health care provider may recommend certain vaccines, such as: Influenza vaccine. This is recommended every year. Tetanus, diphtheria, and acellular pertussis (Tdap, Td) vaccine. You may need a Td booster every 10 years. Zoster vaccine. You may need this after age 75. Pneumococcal 13-valent conjugate (PCV13) vaccine. One dose is recommended after age 60. Pneumococcal polysaccharide (PPSV23) vaccine. One dose is recommended after age 93. Talk to your health care provider about which screenings and vaccines you need and how often you need them. This information is not intended to replace advice given to you by your health care provider. Make sure you discuss any questions you have with your health care provider. Document Released: 03/23/2015 Document Revised: 11/14/2015 Document Reviewed: 12/26/2014 Elsevier Interactive Patient Education  2017 [place] Prevention in the Home Falls can cause injuries. They can happen to people of all ages. There are many things you can do to make your home safe and to help prevent falls. What can I do on the outside of my home? Regularly fix the edges of walkways and driveways and fix any cracks. Remove anything that might make you trip as you walk through a door, such as a raised step or threshold. Trim any bushes or trees on the path to your home. Use bright outdoor lighting. Clear any walking paths of anything that might make someone trip, such as rocks or tools. Regularly check to see if handrails are loose or broken. Make sure that both sides of any steps have handrails. Any raised decks and porches should have guardrails on the edges. Have any leaves, snow, or ice cleared regularly. Use sand or salt on walking paths during winter. Clean up any spills in your garage right away. This includes oil or grease spills. What can I do in the bathroom? Use night  lights. Install grab bars by the toilet and in the tub and shower. Do not use towel bars as grab bars. Use non-skid mats or decals in the tub or shower. If you need to sit down in the shower, use a plastic, non-slip stool. Keep the floor dry. Clean up any water that spills on the floor as soon as it happens. Remove soap buildup in the tub or shower regularly. Attach bath mats securely with double-sided non-slip rug tape. Do not have throw rugs and other things on the floor that can make you trip. What can I do in the bedroom? Use night lights. Make sure that you have a light by your bed that is easy to reach. Do not use any sheets or blankets that are too big for your bed. They should not hang down onto the floor. Have a firm chair that has side arms. You can use this for support while you get dressed. Do not have throw rugs and other things on the floor that can make you trip. What can I do in the kitchen? Clean up any spills right away. Avoid walking on wet floors. Keep items that you use a lot in easy-to-reach places. If you need to reach something above you, use a strong step stool that has a grab bar. Keep electrical cords out of the way. Do not use floor polish or wax that makes floors slippery. If you must use wax, use non-skid floor wax.  Do not have throw rugs and other things on the floor that can make you trip. What can I do with my stairs? Do not leave any items on the stairs. Make sure that there are handrails on both sides of the stairs and use them. Fix handrails that are broken or loose. Make sure that handrails are as long as the stairways. Check any carpeting to make sure that it is firmly attached to the stairs. Fix any carpet that is loose or worn. Avoid having throw rugs at the top or bottom of the stairs. If you do have throw rugs, attach them to the floor with carpet tape. Make sure that you have a light switch at the top of the stairs and the bottom of the stairs. If  you do not have them, ask someone to add them for you. What else can I do to help prevent falls? Wear shoes that: Do not have high heels. Have rubber bottoms. Are comfortable and fit you well. Are closed at the toe. Do not wear sandals. If you use a stepladder: Make sure that it is fully opened. Do not climb a closed stepladder. Make sure that both sides of the stepladder are locked into place. Ask someone to hold it for you, if possible. Clearly mark and make sure that you can see: Any grab bars or handrails. First and last steps. Where the edge of each step is. Use tools that help you move around (mobility aids) if they are needed. These include: Canes. Walkers. Scooters. Crutches. Turn on the lights when you go into a dark area. Replace any light bulbs as soon as they burn out. Set up your furniture so you have a clear path. Avoid moving your furniture around. If any of your floors are uneven, fix them. If there are any pets around you, be aware of where they are. Review your medicines with your doctor. Some medicines can make you feel dizzy. This can increase your chance of falling. Ask your doctor what other things that you can do to help prevent falls. This information is not intended to replace advice given to you by your health care provider. Make sure you discuss any questions you have with your health care provider. Document Released: 12/21/2008 Document Revised: 08/02/2015 Document Reviewed: 03/31/2014 Elsevier Interactive Patient Education  2017 [employer].

## 2020-11-23 ENCOUNTER — Other Ambulatory Visit: Payer: Self-pay

## 2020-11-23 ENCOUNTER — Ambulatory Visit (INDEPENDENT_AMBULATORY_CARE_PROVIDER_SITE_OTHER): Payer: Medicare Other

## 2020-11-23 DIAGNOSIS — E538 Deficiency of other specified B group vitamins: Secondary | ICD-10-CM | POA: Diagnosis not present

## 2020-11-23 NOTE — Progress Notes (Signed)
Pt tolerated b12 ok.

## 2020-12-03 ENCOUNTER — Other Ambulatory Visit: Payer: TRICARE For Life (TFL)

## 2020-12-04 ENCOUNTER — Other Ambulatory Visit: Payer: Self-pay | Admitting: Hematology

## 2020-12-04 ENCOUNTER — Inpatient Hospital Stay: Payer: Medicare Other | Attending: Hematology

## 2020-12-04 ENCOUNTER — Ambulatory Visit (HOSPITAL_COMMUNITY)
Admission: RE | Admit: 2020-12-04 | Discharge: 2020-12-04 | Disposition: A | Discharge status: Home / Self Care | Payer: Medicare Other | Source: Ambulatory Visit | Attending: Hematology | Admitting: Hematology

## 2020-12-04 ENCOUNTER — Other Ambulatory Visit: Payer: Self-pay

## 2020-12-04 DIAGNOSIS — K8050 Calculus of bile duct without cholangitis or cholecystitis without obstruction: Secondary | ICD-10-CM | POA: Diagnosis not present

## 2020-12-04 DIAGNOSIS — K838 Other specified diseases of biliary tract: Secondary | ICD-10-CM | POA: Diagnosis not present

## 2020-12-04 DIAGNOSIS — C182 Malignant neoplasm of ascending colon: Secondary | ICD-10-CM

## 2020-12-04 DIAGNOSIS — C189 Malignant neoplasm of colon, unspecified: Secondary | ICD-10-CM | POA: Diagnosis not present

## 2020-12-04 DIAGNOSIS — N281 Cyst of kidney, acquired: Secondary | ICD-10-CM | POA: Diagnosis not present

## 2020-12-04 MED ORDER — GADOBUTROL 1 MMOL/ML IV SOLN
6.0000 mL | Freq: Once | INTRAVENOUS | Status: AC | PRN
  Administered 2020-12-04: 6 mL via INTRAVENOUS

## 2020-12-07 ENCOUNTER — Telehealth: Payer: Self-pay

## 2020-12-07 NOTE — Telephone Encounter (Signed)
This nurse returned call to patients daughter who wanted to know the results  of the MRI.  This nurse advised that MD does have the results and is planning to go over the results on Monday in the office visit. Daughter acknowledged understanding. Verified the appointment.  No further questions or concerns at this time.

## 2020-12-09 ENCOUNTER — Other Ambulatory Visit: Payer: Self-pay | Admitting: Hematology

## 2020-12-09 DIAGNOSIS — C182 Malignant neoplasm of ascending colon: Secondary | ICD-10-CM

## 2020-12-10 ENCOUNTER — Other Ambulatory Visit: Payer: Self-pay

## 2020-12-10 ENCOUNTER — Inpatient Hospital Stay: Payer: Medicare Other

## 2020-12-10 ENCOUNTER — Inpatient Hospital Stay: Payer: Medicare Other | Attending: Hematology | Admitting: Hematology

## 2020-12-10 ENCOUNTER — Telehealth: Payer: Self-pay

## 2020-12-10 ENCOUNTER — Encounter: Payer: Self-pay | Admitting: Hematology

## 2020-12-10 VITALS — BP 143/81 | HR 103 | Temp 98.1°F | Resp 17 | Ht 60.0 in | Wt 113.6 lb

## 2020-12-10 DIAGNOSIS — Z85038 Personal history of other malignant neoplasm of large intestine: Secondary | ICD-10-CM | POA: Insufficient documentation

## 2020-12-10 DIAGNOSIS — C182 Malignant neoplasm of ascending colon: Secondary | ICD-10-CM | POA: Diagnosis not present

## 2020-12-10 DIAGNOSIS — D649 Anemia, unspecified: Secondary | ICD-10-CM | POA: Insufficient documentation

## 2020-12-10 DIAGNOSIS — K769 Liver disease, unspecified: Secondary | ICD-10-CM | POA: Diagnosis not present

## 2020-12-10 DIAGNOSIS — K8040 Calculus of bile duct with cholecystitis, unspecified, without obstruction: Secondary | ICD-10-CM | POA: Diagnosis not present

## 2020-12-10 DIAGNOSIS — D500 Iron deficiency anemia secondary to blood loss (chronic): Secondary | ICD-10-CM

## 2020-12-10 DIAGNOSIS — E538 Deficiency of other specified B group vitamins: Secondary | ICD-10-CM | POA: Diagnosis not present

## 2020-12-10 LAB — COMPREHENSIVE METABOLIC PANEL
ALT: 188 U/L — ABNORMAL HIGH (ref 0–44)
AST: 216 U/L (ref 15–41)
Albumin: 3.3 g/dL — ABNORMAL LOW (ref 3.5–5.0)
Alkaline Phosphatase: 373 U/L — ABNORMAL HIGH (ref 38–126)
Anion gap: 11 (ref 5–15)
BUN: 14 mg/dL (ref 8–23)
CO2: 26 mmol/L (ref 22–32)
Calcium: 10.2 mg/dL (ref 8.9–10.3)
Chloride: 103 mmol/L (ref 98–111)
Creatinine, Ser: 0.9 mg/dL (ref 0.44–1.00)
GFR, Estimated: >60 mL/min (ref >60)
Glucose, Bld: 110 mg/dL — ABNORMAL HIGH (ref 70–99)
Potassium: 4.1 mmol/L (ref 3.5–5.1)
Sodium: 140 mmol/L (ref 135–145)
Total Bilirubin: 2 mg/dL — ABNORMAL HIGH (ref 0.3–1.2)
Total Protein: 7.4 g/dL (ref 6.5–8.1)

## 2020-12-10 LAB — CEA (IN HOUSE-CHCC): CEA (CHCC-In House): 1.93 ng/mL (ref 0.00–5.00)

## 2020-12-10 LAB — FERRITIN: Ferritin: 656 ng/mL — ABNORMAL HIGH (ref 11–307)

## 2020-12-10 LAB — IRON AND TIBC
Iron: 30 ug/dL — ABNORMAL LOW (ref 41–142)
Saturation Ratios: 13 % — ABNORMAL LOW (ref 21–57)
TIBC: 220 ug/dL — ABNORMAL LOW (ref 236–444)
UIBC: 190 ug/dL (ref 120–384)

## 2020-12-10 LAB — CBC WITH DIFFERENTIAL/PLATELET
Abs Immature Granulocytes: 0.05 10*3/uL (ref 0.00–0.07)
Basophils Absolute: 0 10*3/uL (ref 0.0–0.1)
Basophils Relative: 1 %
Eosinophils Absolute: 0 10*3/uL (ref 0.0–0.5)
Eosinophils Relative: 0 %
HCT: 41.4 % (ref 36.0–46.0)
Hemoglobin: 13.5 g/dL (ref 12.0–15.0)
Immature Granulocytes: 1 %
Lymphocytes Relative: 5 %
Lymphs Abs: 0.4 10*3/uL — ABNORMAL LOW (ref 0.7–4.0)
MCH: 28.2 pg (ref 26.0–34.0)
MCHC: 32.6 g/dL (ref 30.0–36.0)
MCV: 86.4 fL (ref 80.0–100.0)
Monocytes Absolute: 0.6 10*3/uL (ref 0.1–1.0)
Monocytes Relative: 7 %
Neutro Abs: 7.4 10*3/uL (ref 1.7–7.7)
Neutrophils Relative %: 86 %
Platelets: 327 10*3/uL (ref 150–400)
RBC: 4.79 MIL/uL (ref 3.87–5.11)
RDW: 13.7 % (ref 11.5–15.5)
WBC: 8.6 10*3/uL (ref 4.0–10.5)
nRBC: 0 % (ref 0.0–0.2)

## 2020-12-10 NOTE — Telephone Encounter (Signed)
CRITICAL VALUE STICKER  CRITICAL VALUE: AST = [phone]  RECEIVER (on-site recipient of call): Yetta Glassman, [place] NOTIFIED: 12/10/20 at 3:07pm  MESSENGER (representative from lab): Ulice Dash  MD NOTIFIED: Dr. Ky Barban  TIME OF NOTIFICATION: 12/10/20 at 3:10pm  RESPONSE: Notification given to Dr. Burr Medico for follow-up with pt.

## 2020-12-10 NOTE — Progress Notes (Signed)
[facility]   [phone] [phone]   Clinic Follow up Note   Patient Care Team: Dettinger, [name], MD as PCP - General (Family Medicine) Zadie Rhine Clent Demark, MD as Consulting Physician (Ophthalmology) Donnie Mesa, MD as Consulting Physician (General Surgery) Truitt Merle, MD as Consulting Physician (Hematology)  Date of Service:  12/10/2020  CHIEF COMPLAINT: f/u of right colon cancer  CURRENT THERAPY:  Surveillance  ASSESSMENT & PLAN:  Ashley Peters is a 87 y.o. female with   1. Recent CBD stone, cholecystitis, and a liver lesion on MRI. --She presented to the ED on 09/19/20 with chest pain that radiated down into her abdomen/pelvis. She was admitted and underwent abdomen MRI the following day. This showed: enhancement of lesion along hepatic margin; central biliary case and stones filling the dilated common bile duct with diffuse biliary duct dilation; slight increase in size of celiac lymph nodes, no retroperitonealadenopathy.  -Status post ERCP and multiple stone removal -MRI abdomen 12/04/20 showed: persistent intra- and extrahepatic biliary ductal dilatation, similar to prior on 09/20/20; 2.6 cm calculus in intrahepatic common bile duct, and stable anterior right lobe liver lesion, which still remains suspicious for malignancy. -I plan to discuss her case in our GI tumor board to see if we biopsy of her liver lesion. I checked Ca19.9 today  -She continues to have some back pain that I believe is related to the remaining, large bile duct stone. I will reach out to Dr. Carlean Purl to discuss options for the stone.   2. Cancer of the right colon, invasive adenocarcinoma, G1, pT4bN0Mx stage IIA, MSI-stable  -Diagnosed in 09/2016. Genetic testing was negative. Treated with right hemicolectomy and adjuvant Xeloda. Currently on surveillance. -She had a colonoscopy on 12/23/2017 with a few polyps removed. Due to her advanced age, no further surveillance  colonoscopy was recommended.  -MRI abdomen 12/04/20 showed: no significant change in heterogeneously enhancing lesions in anterior right liver lobe.    3 Anemia and B12 deficiency  -She currently receives monthly B12 injections with PCP.  -Anemia resolved     Plan  -lab and MRI scan reviewed  -I will present her case during our GI conference -lab and f/u in 1 month, or sooner if needed    No problem-specific Assessment & Plan notes found for this encounter.   SUMMARY OF ONCOLOGIC HISTORY: Oncology History Overview Note  Cancer Staging Cancer of right colon [facility]) Staging form: Colon and Rectum, AJCC 8th Edition - Pathologic stage from 10/14/2016: Stage IIC (pT4b, pN0, cM0) - Signed by Truitt Merle, MD on 11/07/2016     Cancer of right colon ([place])  09/19/2016 Imaging   CT A/P 09/19/16 IMPRESSION: Large 9.5 cm intraluminal mass in the cecum and ascending colon, consistent with colon carcinoma. Tumor extension into adjacent pericolonic fat seen along the lateral wall.   Mild right pericolonic and mesenteric lymphadenopathy, suspicious for metastatic disease. No other sites of metastatic disease identified.   Colonic diverticulosis, without radiographic evidence of diverticulitis. Tiny hiatal hernia, and small epigastric ventral hernia containing only fat.   Aortic atherosclerosis.   10/14/2016 Initial Diagnosis   Cancer of right colon ([place])   10/14/2016 Surgery   OPEN RIGHT HEMICOLECTOMY and REPAIR OF EPIGASTRIC VENTRAL HERNIA by Dr. Georgette Dover and Dr. Dalbert Batman    10/14/2016 Pathology Results   10/14/16 Diagnosis Colon, segmental resection for tumor, Right ADENOCARCINOMA WITH EXTENSIVE EXTRA CELLULAR MUCIN, GRADE 1, (11.0 CM) THE TUMOR INVADES THROUGH THE CECUM WALL INTO ADJACENT TERMINAL ILEUM (  PT4B) NINETEEN BENIGN LYMPH NODES (0/19) SUPPURATIVE INFLAMMATION WITH FIBROSIS AND ADHESION TO ABDOMINAL WALL NO ADENOCARCINOMA IDENTIFIED   11/19/2016 Imaging   CT Chest WO Contrast  11/19/16 IMPRESSION: 1. No evidence of metastatic disease. 2. Aortic atherosclerosis (ICD10-170.0). Coronary artery calcification.   11/25/2016 - 03/24/2017 Chemotherapy   Adjuvant Xeloda two weeks on, one week off starting 11/25/16 for 3 months     12/21/2016 Genetic Testing   Patient had genetic testing due to a personal history of colon cancer and a family history of breast, uterine, and colon cancer. The Common Hereditary Cancers Panel was ordered.  The Hereditary Gene Panel offered by [facility] includes sequencing and/or deletion duplication testing of the following 47 genes: APC, ATM, AXIN2, BARD1, BMPR1A, BRCA1, BRCA2, BRIP1, CDH1, CDKN2A (p14ARF), CDKN2A (p16INK4a), CHEK2, CDK4, CTNNA1, DICER1, EPCAM (Deletion/duplication testing only), GREM1 (promoter region deletion/duplication testing only), KIT, MEN1, MLH1, MSH2, MSH3, MSH6, MUTYH, NBN, NF1, NHTL1, PALB2, PDGFRA, PMS2, POLD1, POLE, PTEN, RAD50, RAD51C, RAD51D, SDHB, SDHC, SDHD, SMAD4, SMARCA4. STK11, TP53, TSC1, TSC2, and VHL.  The following genes were evaluated for sequence changes only: SDHA and HOXB13 c.251G>A variant only.  Results: Negative- no pathogenic variants identified.  The date of this test report is 12/21/2016.    04/27/2017 Imaging   CT AP W Contrast 04/27/17 IMPRESSION: Status post right hemicolectomy. No evidence of recurrent or metastatic disease. Additional stable ancillary findings as above.   08/07/2017 Imaging   08/07/2017 DEXA ASSESSMENT: The BMD measured at Forearm Radius 33% is 0.543 g/cm2 with a T-score of -3.9. This patient is considered osteoporotic according to [facility]) criteria.   11/25/2017 Imaging    11/25/2017 CT CAP IMPRESSION: 1. Abnormal appearance of the right lobe of the liver, with development of progressive peripheral intrahepatic duct dilatation with ill definition of the more central right hepatic duct. This appearance is indeterminate. Considerations include an  incidental primary liver lesion such as cholangiocarcinoma, an otherwise occult liver metastasis with secondary biliary duct dilatation, or infectious/inflammatory cholangitis. If this 87 year old can undergo MRI/MRCP, this should be considered. Multiphase CT (with delayed phase imaging to evaluate for possible cholangiocarcinoma) and/or focused ultrasound would be alternate imaging strategies. 2. Otherwise, no evidence of metastatic disease in the chest, abdomen, or pelvis. 3. Coronary artery atherosclerosis. Aortic Atherosclerosis (ICD10-I70.0).   12/10/2017 Imaging   12/10/2017 MRI Abdomen IMPRESSION: 1. Stricturing of the central bile ducts of segment V RIGHT hepatic lobe without clear lesion identified. Differential would include benign and malignant stricturing including cholangiocarcinoma. Evaluation is hampered by clip artifact from cholecystectomy clips and duodenum gas. FDG PET scan may or may not be helpful in identify lesion. ERCP would be a second option to evaluate this central obstruction. 2. No enhancing lesion the in the liver parenchyma parenchyma. Altered perfusion related to the biliary obstruction.   12/23/2017 Procedure   12/23/2017 Colonoscopy Impressions: - Decreased sphincter tone found on digital rectal exam. - Two 3 mm polyps in the rectum and in the descending colon, removed with a cold snare. Resected and retrieved. - One 8 mm polyp in the rectum, removed with a hot snare. Resected and retrieved. - Diverticulosis in the entire examined colon. - The examination was otherwise normal on direct and retroflexion views.   08/24/2018 Imaging   CT AP  IMPRESSION: 1. Over the last 9 months there has been a similar not significantly progressive appearance of biliary dilatation and complexity in segment 5 of the liver along with mild intrahepatic biliary dilatation elsewhere, and moderate extrahepatic  biliary dilatation. Some of this is likely postinflammatory;  the most complex and dilated bile duct did not have definite enhancement on the recent MRI to definitively indicate cholangiocarcinoma or metastatic lesion but the appearance of the right hepatic lobe is progressive from 09/19/2016 and from 04/27/2017. In the context of the patient's colon cancer, nuclear medicine PET-CT may provide some helpful adjunct information given this unusual appearance. 2. Other imaging findings of potential clinical significance: Mitral calcification. Aortoiliac atherosclerotic vascular disease. Renal cysts. Trace amount of gas in the urinary bladder, query recent catheterization. Descending and sigmoid colon diverticulosis. Two ventral hernias are enlarged compared to the prior exam but lax with wide ostium. No overt pathologic adenopathy currently.   08/24/2019 Imaging   CT CAP w contrast  IMPRESSION: 1. Right hemicolectomy. No evidence of recurrent or metastatic disease. 2. Intrahepatic/extrahepatic biliary ductal dilatation, unchanged from 11/25/2017. 3. Multiple midline supraumbilical ventral hernias contain unobstructed bowel or fat. 4. Aortic atherosclerosis (ICD10-I70.0). Coronary artery calcification.   09/20/2020 Imaging   MRI Abdomen  IMPRESSION: 1. Irregular peripheral enhancement and progressive enhancement of the lesion along the hepatic margin suspicious for peribiliary spread of colonic neoplasm (metastatic disease from colon primary) given findings on prior studies versus is cholangiocarcinoma. 2. Central biliary cast and stones filling the dilated common bile duct with diffuse biliary duct dilation. No visible enhancement to suggest tumor in these areas though assessment is limited due to clip artifact. Tumor extending into the RIGHT hepatic duct is difficult to exclude given the presence of clip artifact but on image 26 of series 33 there is suggestion of enhancing material within the lumen. 3. Slight increase in size of celiac lymph  nodes dating back to 2021, suspicious in light of other findings. No retroperitoneal adenopathy.   12/04/2020 Imaging   MRI Abdomen  IMPRESSION: 1. Interval extraction of multiple common bile duct calculi with persistent intra and extrahepatic biliary ductal dilatation, similar in caliber to prior examination.   2. A large calculus remains in the intrahepatic common bile duct measuring at least 2.6 cm.   3. No significant change in heterogeneously enhancing, somewhat ill-defined lesions of the anterior right lobe of the liver, hepatic segment V, or within the central liver. As on prior examination, these remain consistent with either metastases from patient's known colon malignancy or alternately cholangiocarcinoma.      INTERVAL HISTORY:  Ashley Peters is here for a follow up of colon cancer. She was last seen by me on 11/02/20. She presents to the clinic accompanied by her daughter. She notes nervousness today because of her scan. She reports she has been having back pain, including shooting pains, similar to before her gallstones were removed. She also notes some neck stiffness and a mild headache.   All other systems were reviewed with the patient and are negative.  MEDICAL HISTORY:  Past Medical History:  Diagnosis Date   Anemia    Blood transfusion without reported diagnosis    Cancer ([place])    Colon cancer ([place]) dx'd 10/2016   Cystoid macular edema of left eye 07/05/2019   Diverticulosis    Family history of breast cancer    Family history of colon cancer    Family history of uterine cancer    GERD (gastroesophageal reflux disease)    History of kidney stones    Hyperlipidemia    Hypertension    Macular degeneration    Osteopenia     SURGICAL HISTORY: Past Surgical History:  Procedure Laterality Date  ABDOMINAL HYSTERECTOMY     ablation tr Great saphenous vein  10/17/2009   BREAST EXCISIONAL BIOPSY Bilateral 1971   brest biopsy  1971   CHOLECYSTECTOMY      EPIGASTRIC HERNIA REPAIR N/A 10/14/2016   Procedure: REPAIR OF EPIGASTRIC VENTRAL HERNIA;  Surgeon: Donnie Mesa, MD;  Location: [facility];  Service: General;  Laterality: N/A;   ERCP N/A 09/21/2020   Procedure: ENDOSCOPIC RETROGRADE CHOLANGIOPANCREATOGRAPHY (ERCP);  Surgeon: Gatha Mayer, MD;  Location: [facility];  Service: Endoscopy;  Laterality: N/A;   lt. distal ureteral stone extraction  09/14/1991   PARTIAL COLECTOMY Right 10/14/2016   Procedure: OPEN RIGHT HEMICOLECTOMY;  Surgeon: Donnie Mesa, MD;  Location: [place];  Service: General;  Laterality: Right;   REMOVAL OF STONES  09/21/2020   Procedure: REMOVAL OF STONES;  Surgeon: Gatha Mayer, MD;  Location: [facility];  Service: Endoscopy;;   SPHINCTEROTOMY  09/21/2020   Procedure: [name] Mayans;  Surgeon: Gatha Mayer, MD;  Location: [facility];  Service: Endoscopy;;   STONE EXTRACTION WITH BASKET  09/21/2020   Procedure: STONE EXTRACTION WITH BASKET;  Surgeon: Gatha Mayer, MD;  Location: [facility];  Service: Endoscopy;;    I have reviewed the social history and family history with the patient and they are unchanged from previous note.  ALLERGIES:  is allergic to benicar [olmesartan medoxomil] and amoxicillin.  MEDICATIONS:  Current Outpatient Medications  Medication Sig Dispense Refill   acetaminophen (TYLENOL) 500 MG tablet Take 2 tablets (1,000 mg total) by mouth every 6 (six) hours as needed for moderate pain. 30 tablet 0   alendronate (FOSAMAX) 70 MG tablet Take with a full glass of water on an empty stomach. 12 tablet 3   Multiple Vitamins-Minerals (PRESERVISION AREDS 2 PO) Take 1 capsule by mouth 2 (two) times daily.     Current Facility-Administered Medications  Medication Dose Route Frequency Provider Last Rate Last Admin   cyanocobalamin ((VITAMIN B-12)) injection 1,000 mcg  1,000 mcg Intramuscular Q30 days Dettinger, [name], MD   1,000 mcg at 11/23/20 0847    PHYSICAL EXAMINATION: ECOG PERFORMANCE  STATUS: 1 - Symptomatic but completely ambulatory  Vitals:   12/10/20 1308  BP: (!) 143/81  Pulse: (!) 103  Resp: 17  Temp: 98.1 F (36.7 C)  SpO2: 98%   Wt Readings from Last 3 Encounters:  12/10/20 113 lb 9.6 oz (51.5 kg)  11/21/20 116 lb (52.6 kg)  11/02/20 118 lb 8 oz (53.8 kg)     GENERAL:alert, no distress and comfortable SKIN: skin color, texture, turgor are normal, no rashes or significant lesions EYES: normal, Conjunctiva are pink and non-injected, sclera clear  NECK: supple, thyroid normal size, non-tender, without nodularity LYMPH:  no palpable lymphadenopathy in the cervical, axillary  LUNGS: clear to auscultation and percussion with normal breathing effort HEART: regular rate & rhythm and no murmurs and no lower extremity edema ABDOMEN:abdomen soft, non-tender and normal bowel sounds Musculoskeletal:no cyanosis of digits and no clubbing  NEURO: alert & oriented x 3 with fluent speech, no focal motor/sensory deficits  LABORATORY DATA:  I have reviewed the data as listed CBC Latest Ref Rng & Units 12/10/2020 11/02/2020 10/17/2020  WBC 4.0 - 10.5 K/uL 8.6 5.8 5.6  Hemoglobin 12.0 - 15.0 g/dL 13.5 13.3 13.1  Hematocrit 36.0 - 46.0 % 41.4 41.0 40.6  Platelets 150 - 400 K/uL 327 195 353     CMP Latest Ref Rng & Units 12/10/2020 11/02/2020 10/17/2020  Glucose 70 - 99 mg/dL 110(H)  107(H) 97  BUN 8 - 23 mg/dL _0 Creatinine 0.44 - 1.00 mg/dL 0.90 0.81 0.86  Sodium 135 - 145 mmol/L 140 142 142  Potassium 3.5 - 5.1 mmol/L 4.1 4.9 5.4(H)  Chloride 98 - 111 mmol/L 103 109 104  CO2 22 - 32 mmol/L _1 Calcium 8.9 - 10.3 mg/dL 10.2 9.5 10.0  Total Protein 6.5 - 8.1 g/dL 7.4 6.8 6.6  Total Bilirubin 0.3 - 1.2 mg/dL 2.0(H) 0.6 0.5  Alkaline Phos 38 - 126 U/L 373(H) 73 256(H)  AST 15 - 41 U/L 216(HH) 15 52(H)  ALT 0 - 44 U/L 188(H) 10 35(H)      RADIOGRAPHIC STUDIES: I have personally reviewed the radiological images as listed and agreed with the findings in  the report. No results found.    No orders of the defined types were placed in this encounter.  All questions were answered. The patient knows to call the clinic with any problems, questions or concerns. No barriers to learning was detected. The total time spent in the appointment was 30 minutes.     Truitt Merle, MD 12/10/2020   I, Wilburn Mylar, am acting as scribe for Truitt Merle, MD.   I have reviewed the above documentation for accuracy and completeness, and I agree with the above.

## 2020-12-11 LAB — CANCER ANTIGEN 19-9: CA 19-9: 53 U/mL — ABNORMAL HIGH (ref 0–35)

## 2020-12-12 ENCOUNTER — Other Ambulatory Visit: Payer: Self-pay

## 2020-12-12 NOTE — Progress Notes (Signed)
The proposed treatment discussed in conference is for discussion purpose only and is not a binding recommendation.  The patients have not been physically examined, or presented with their treatment options.  Therefore, final treatment plans cannot be decided.  

## 2020-12-14 ENCOUNTER — Ambulatory Visit (INDEPENDENT_AMBULATORY_CARE_PROVIDER_SITE_OTHER): Payer: Medicare Other

## 2020-12-14 ENCOUNTER — Other Ambulatory Visit: Payer: Self-pay

## 2020-12-14 ENCOUNTER — Encounter: Payer: Self-pay | Admitting: Hematology

## 2020-12-14 DIAGNOSIS — Z23 Encounter for immunization: Secondary | ICD-10-CM | POA: Diagnosis not present

## 2020-12-14 DIAGNOSIS — E538 Deficiency of other specified B group vitamins: Secondary | ICD-10-CM

## 2020-12-14 NOTE — Progress Notes (Signed)
Cyanocobalamin injection given to left deltoid.  Patient tolerated well. 

## 2020-12-15 ENCOUNTER — Telehealth: Payer: Self-pay | Admitting: Hematology

## 2020-12-15 ENCOUNTER — Other Ambulatory Visit: Payer: Self-pay | Admitting: Hematology

## 2020-12-15 DIAGNOSIS — C787 Secondary malignant neoplasm of liver and intrahepatic bile duct: Secondary | ICD-10-CM

## 2020-12-15 NOTE — Telephone Encounter (Signed)
I called pt's daughter back and reviewed her lab results from last week, and our GI tumor board discussion.  Her liver lesion is not highly suspicious for malignancy, but cannot rule out, we recommend PET scan for further evaluation. Her worsening renal function is likely related to her gallbladder and CBD stone, I will send a message to Dr. Arelia Longest for recommendations.  Her tumor marker CA 19.9 was elevated, although is not specific in the setting of mild biliary obstruction.  I recommend her to watch jaundice, right upper quadrant pain and fever and call us if needed.  Her daughter voiced good understanding, and agrees with the plan.  Truitt Merle  12/15/2020

## 2020-12-17 ENCOUNTER — Other Ambulatory Visit: Payer: Self-pay

## 2020-12-17 NOTE — Telephone Encounter (Signed)
I looked at images and see what the radiologist thinks is a stone.  I think we need to see what PET scan shows [place] before any possible GI intervention. Please let me know those results when they are in

## 2020-12-18 ENCOUNTER — Telehealth: Payer: Self-pay

## 2020-12-18 NOTE — Telephone Encounter (Signed)
This nurse spoke with patients daughter and made aware of PET scheduled for 10/225/22 at 8 am.  Notified of 730 arrival time and must be NPO after midnight.  No further questions or concerns at this time.

## 2020-12-18 NOTE — Telephone Encounter (Signed)
This nurse moved Lab and Office visit appointments per MD request.  Spoke with patients daughter and made aware. She is in agreement with changes.  No further questions or concerns at this time.

## 2020-12-19 ENCOUNTER — Telehealth: Payer: Self-pay | Admitting: Internal Medicine

## 2020-12-19 NOTE — Telephone Encounter (Signed)
I called the patient to touch base with her.  She does not seem to have any signs of biliary obstruction though we know her labs are abnormal.  She does not have signs of cholangitis.  She was wondering if head and neck pains were related to suspected bile duct stone and I said no.  I do not think she is having any biliary colic either.  She was advised to contact my office again if things like this develop or any other questions arise.  Once I see PET scan results we will determine if and when another ERCP is appropriate.

## 2020-12-27 ENCOUNTER — Ambulatory Visit (HOSPITAL_COMMUNITY): Payer: TRICARE For Life (TFL)

## 2021-01-01 ENCOUNTER — Other Ambulatory Visit: Payer: Self-pay

## 2021-01-01 ENCOUNTER — Ambulatory Visit (HOSPITAL_COMMUNITY)
Admission: RE | Admit: 2021-01-01 | Discharge: 2021-01-01 | Disposition: A | Discharge status: Home / Self Care | Payer: Medicare Other | Source: Ambulatory Visit | Attending: Hematology | Admitting: Hematology

## 2021-01-01 ENCOUNTER — Inpatient Hospital Stay: Payer: Medicare Other

## 2021-01-01 DIAGNOSIS — K769 Liver disease, unspecified: Secondary | ICD-10-CM | POA: Diagnosis not present

## 2021-01-01 DIAGNOSIS — Z85038 Personal history of other malignant neoplasm of large intestine: Secondary | ICD-10-CM | POA: Diagnosis not present

## 2021-01-01 DIAGNOSIS — R911 Solitary pulmonary nodule: Secondary | ICD-10-CM | POA: Insufficient documentation

## 2021-01-01 DIAGNOSIS — I2510 Atherosclerotic heart disease of native coronary artery without angina pectoris: Secondary | ICD-10-CM | POA: Insufficient documentation

## 2021-01-01 DIAGNOSIS — D649 Anemia, unspecified: Secondary | ICD-10-CM | POA: Diagnosis not present

## 2021-01-01 DIAGNOSIS — C189 Malignant neoplasm of colon, unspecified: Secondary | ICD-10-CM | POA: Diagnosis not present

## 2021-01-01 DIAGNOSIS — K429 Umbilical hernia without obstruction or gangrene: Secondary | ICD-10-CM | POA: Diagnosis not present

## 2021-01-01 DIAGNOSIS — D500 Iron deficiency anemia secondary to blood loss (chronic): Secondary | ICD-10-CM

## 2021-01-01 DIAGNOSIS — K8040 Calculus of bile duct with cholecystitis, unspecified, without obstruction: Secondary | ICD-10-CM | POA: Diagnosis not present

## 2021-01-01 DIAGNOSIS — C787 Secondary malignant neoplasm of liver and intrahepatic bile duct: Secondary | ICD-10-CM | POA: Diagnosis not present

## 2021-01-01 DIAGNOSIS — C182 Malignant neoplasm of ascending colon: Secondary | ICD-10-CM

## 2021-01-01 DIAGNOSIS — E538 Deficiency of other specified B group vitamins: Secondary | ICD-10-CM | POA: Diagnosis not present

## 2021-01-01 DIAGNOSIS — I700 Atherosclerosis of aorta: Secondary | ICD-10-CM | POA: Diagnosis not present

## 2021-01-01 DIAGNOSIS — C799 Secondary malignant neoplasm of unspecified site: Secondary | ICD-10-CM | POA: Diagnosis not present

## 2021-01-01 LAB — CBC WITH DIFFERENTIAL/PLATELET
Abs Immature Granulocytes: 0.01 10*3/uL (ref 0.00–0.07)
Basophils Absolute: 0.1 10*3/uL (ref 0.0–0.1)
Basophils Relative: 1 %
Eosinophils Absolute: 0 10*3/uL (ref 0.0–0.5)
Eosinophils Relative: 1 %
HCT: 43 % (ref 36.0–46.0)
Hemoglobin: 13.7 g/dL (ref 12.0–15.0)
Immature Granulocytes: 0 %
Lymphocytes Relative: 11 %
Lymphs Abs: 0.7 10*3/uL (ref 0.7–4.0)
MCH: 27.9 pg (ref 26.0–34.0)
MCHC: 31.9 g/dL (ref 30.0–36.0)
MCV: 87.6 fL (ref 80.0–100.0)
Monocytes Absolute: 0.3 10*3/uL (ref 0.1–1.0)
Monocytes Relative: 5 %
Neutro Abs: 5.3 10*3/uL (ref 1.7–7.7)
Neutrophils Relative %: 82 %
Platelets: 190 10*3/uL (ref 150–400)
RBC: 4.91 MIL/uL (ref 3.87–5.11)
RDW: 14.8 % (ref 11.5–15.5)
WBC: 6.4 10*3/uL (ref 4.0–10.5)
nRBC: 0 % (ref 0.0–0.2)

## 2021-01-01 LAB — COMPREHENSIVE METABOLIC PANEL
ALT: 11 U/L (ref 0–44)
AST: 19 U/L (ref 15–41)
Albumin: 3.8 g/dL (ref 3.5–5.0)
Alkaline Phosphatase: 74 U/L (ref 38–126)
Anion gap: 12 (ref 5–15)
BUN: 16 mg/dL (ref 8–23)
CO2: 23 mmol/L (ref 22–32)
Calcium: 9.6 mg/dL (ref 8.9–10.3)
Chloride: 106 mmol/L (ref 98–111)
Creatinine, Ser: 0.76 mg/dL (ref 0.44–1.00)
GFR, Estimated: >60 mL/min (ref >60)
Glucose, Bld: 91 mg/dL (ref 70–99)
Potassium: 4.1 mmol/L (ref 3.5–5.1)
Sodium: 141 mmol/L (ref 135–145)
Total Bilirubin: 1 mg/dL (ref 0.3–1.2)
Total Protein: 6.9 g/dL (ref 6.5–8.1)

## 2021-01-01 LAB — GLUCOSE, CAPILLARY: Glucose-Capillary: 107 mg/dL — ABNORMAL HIGH (ref 70–99)

## 2021-01-01 LAB — CEA (IN HOUSE-CHCC): CEA (CHCC-In House): 3.71 ng/mL (ref 0.00–5.00)

## 2021-01-01 LAB — FERRITIN: Ferritin: 137 ng/mL (ref 11–307)

## 2021-01-01 MED ORDER — FLUDEOXYGLUCOSE F - 18 (FDG) INJECTION
5.5000 | Freq: Once | INTRAVENOUS | Status: AC
  Administered 2021-01-01: 5.5 via INTRAVENOUS

## 2021-01-04 ENCOUNTER — Inpatient Hospital Stay (HOSPITAL_BASED_OUTPATIENT_CLINIC_OR_DEPARTMENT_OTHER): Payer: Medicare Other | Admitting: Hematology

## 2021-01-04 ENCOUNTER — Telehealth: Payer: Self-pay

## 2021-01-04 DIAGNOSIS — C182 Malignant neoplasm of ascending colon: Secondary | ICD-10-CM

## 2021-01-04 NOTE — Telephone Encounter (Signed)
Called pt's daughter Donah Driver to confirm her mom's telehealth (phone) visit today at 2:20pm today.  Serita confirmed her mom's appt and requested that Dr. Burr Medico calls her on her cell phone [phone] at the time of the appt.  Will notify Dr. Burr Medico of the pt's daughter's request.

## 2021-01-04 NOTE — Progress Notes (Signed)
[place]   [phone] [phone]   Clinic Follow up Note   Patient Care Team: Dettinger, [name], MD as PCP - General (Family Medicine) Zadie Rhine Clent Demark, MD as Consulting Physician (Ophthalmology) Donnie Mesa, MD as Consulting Physician (General Surgery) Truitt Merle, MD as Consulting Physician (Hematology)  Date of Service:  01/04/2021  I connected with Ashley Peters on 01/04/2021 at 5:20pm by telephone visit and verified that I am speaking with the correct person using two identifiers.  I discussed the limitations, risks, security and privacy concerns of performing an evaluation and management service by telephone and the availability of in person appointments. I also discussed with the patient that there may be a patient responsible charge related to this service. The patient expressed understanding and agreed to proceed.   Other persons participating in the visit and their role in the encounter:  daughter   Patient's location:  home  Provider's location:  office   CHIEF COMPLAINT: f/u of right colon cancer  CURRENT THERAPY:  Surveillance   ASSESSMENT & PLAN:  Ashley Peters is a 87 y.o. female with   1. Recent CBD stone, cholecystitis, and a liver lesion on MRI. --She presented to the ED on 09/19/20 with chest pain that radiated down into her abdomen/pelvis. She was admitted and underwent abdomen MRI the following day. This showed: enhancement of lesion along hepatic margin; central biliary case and stones filling the dilated common bile duct with diffuse biliary duct dilation; slight increase in size of celiac lymph nodes, no retroperitonealadenopathy.  -Status post ERCP and multiple stone removal -MRI abdomen 12/04/20 showed: persistent intra- and extrahepatic biliary ductal dilatation, similar to prior on 09/20/20; 2.6 cm calculus in intrahepatic common bile duct, and stable anterior right lobe liver lesion, which still remains suspicious  for malignancy. -PET on 01/01/21 showed: hypermetabolic 9 mm ground-glass RLL nodule, new since 09/2020; 2 areas of low-level hypermetabolism in liver without obvious underlying correlate on CT. I discussed with pt and her daughter  -since her lung lesion is difficult to biopsy, I recommend repeat CT chest for close f/u in 3 months. Will also f/u her liver lesion  -Her LFTs have returned to normal lately.  -f/u in 3 months    2. Cancer of the right colon, invasive adenocarcinoma, G1, pT4bN0Mx stage IIA, MSI-stable  -Diagnosed in 09/2016. Genetic testing was negative. Treated with right hemicolectomy and adjuvant Xeloda. Currently on surveillance. -She had a colonoscopy on 12/23/2017 with a few polyps removed. Due to her advanced age, no further surveillance colonoscopy was recommended.  -MRI abdomen 12/04/20 showed: no significant change in heterogeneously enhancing lesions in anterior right liver lobe.    3. Anemia and B12 deficiency  -She currently receives monthly B12 injections with PCP.  -Anemia resolved     Plan  -PET scan reviewed  -f/u in 3 months with lab and CT chest w contrast (also to evaluate liver lesion) a few days before -I informed pt that Dr. Carlean Purl will contact her to repeat ERCP to remove her residual biliary stone   No problem-specific Assessment & Plan notes found for this encounter.    SUMMARY OF ONCOLOGIC HISTORY: Oncology History Overview Note  Cancer Staging Cancer of right colon [facility]) Staging form: Colon and Rectum, AJCC 8th Edition - Pathologic stage from 10/14/2016: Stage IIC (pT4b, pN0, cM0) - Signed by Truitt Merle, MD on 11/07/2016     Cancer of right colon [facility])  09/19/2016 Imaging   CT A/P  09/19/16 IMPRESSION: Large 9.5 cm intraluminal mass in the cecum and ascending colon, consistent with colon carcinoma. Tumor extension into adjacent pericolonic fat seen along the lateral wall.   Mild right pericolonic and mesenteric lymphadenopathy,  suspicious for metastatic disease. No other sites of metastatic disease identified.   Colonic diverticulosis, without radiographic evidence of diverticulitis. Tiny hiatal hernia, and small epigastric ventral hernia containing only fat.   Aortic atherosclerosis.   10/14/2016 Initial Diagnosis   Cancer of right colon ([place])   10/14/2016 Surgery   OPEN RIGHT HEMICOLECTOMY and REPAIR OF EPIGASTRIC VENTRAL HERNIA by Dr. Georgette Dover and Dr. Dalbert Batman    10/14/2016 Pathology Results   10/14/16 Diagnosis Colon, segmental resection for tumor, Right ADENOCARCINOMA WITH EXTENSIVE EXTRA CELLULAR MUCIN, GRADE 1, (11.0 CM) THE TUMOR INVADES THROUGH THE CECUM WALL INTO ADJACENT TERMINAL ILEUM (PT4B) NINETEEN BENIGN LYMPH NODES (0/19) SUPPURATIVE INFLAMMATION WITH FIBROSIS AND ADHESION TO ABDOMINAL WALL NO ADENOCARCINOMA IDENTIFIED   11/19/2016 Imaging   CT Chest WO Contrast 11/19/16 IMPRESSION: 1. No evidence of metastatic disease. 2. Aortic atherosclerosis (ICD10-170.0). Coronary artery calcification.   11/25/2016 - 03/24/2017 Chemotherapy   Adjuvant Xeloda two weeks on, one week off starting 11/25/16 for 3 months     12/21/2016 Genetic Testing   Patient had genetic testing due to a personal history of colon cancer and a family history of breast, uterine, and colon cancer. The Common Hereditary Cancers Panel was ordered.  The Hereditary Gene Panel offered by [facility] includes sequencing and/or deletion duplication testing of the following 47 genes: APC, ATM, AXIN2, BARD1, BMPR1A, BRCA1, BRCA2, BRIP1, CDH1, CDKN2A (p14ARF), CDKN2A (p16INK4a), CHEK2, CDK4, CTNNA1, DICER1, EPCAM (Deletion/duplication testing only), GREM1 (promoter region deletion/duplication testing only), KIT, MEN1, MLH1, MSH2, MSH3, MSH6, MUTYH, NBN, NF1, NHTL1, PALB2, PDGFRA, PMS2, POLD1, POLE, PTEN, RAD50, RAD51C, RAD51D, SDHB, SDHC, SDHD, SMAD4, SMARCA4. STK11, TP53, TSC1, TSC2, and VHL.  The following genes were evaluated for sequence changes  only: SDHA and HOXB13 c.251G>A variant only.  Results: Negative- no pathogenic variants identified.  The date of this test report is 12/21/2016.    04/27/2017 Imaging   CT AP W Contrast 04/27/17 IMPRESSION: Status post right hemicolectomy. No evidence of recurrent or metastatic disease. Additional stable ancillary findings as above.   08/07/2017 Imaging   08/07/2017 DEXA ASSESSMENT: The BMD measured at Forearm Radius 33% is 0.543 g/cm2 with a T-score of -3.9. This patient is considered osteoporotic according to [facility]) criteria.   11/25/2017 Imaging    11/25/2017 CT CAP IMPRESSION: 1. Abnormal appearance of the right lobe of the liver, with development of progressive peripheral intrahepatic duct dilatation with ill definition of the more central right hepatic duct. This appearance is indeterminate. Considerations include an incidental primary liver lesion such as cholangiocarcinoma, an otherwise occult liver metastasis with secondary biliary duct dilatation, or infectious/inflammatory cholangitis. If this 87 year old can undergo MRI/MRCP, this should be considered. Multiphase CT (with delayed phase imaging to evaluate for possible cholangiocarcinoma) and/or focused ultrasound would be alternate imaging strategies. 2. Otherwise, no evidence of metastatic disease in the chest, abdomen, or pelvis. 3. Coronary artery atherosclerosis. Aortic Atherosclerosis (ICD10-I70.0).   12/10/2017 Imaging   12/10/2017 MRI Abdomen IMPRESSION: 1. Stricturing of the central bile ducts of segment V RIGHT hepatic lobe without clear lesion identified. Differential would include benign and malignant stricturing including cholangiocarcinoma. Evaluation is hampered by clip artifact from cholecystectomy clips and duodenum gas. FDG PET scan may or may not be helpful in identify lesion. ERCP would be a second option  to evaluate this central obstruction. 2. No enhancing lesion the in the  liver parenchyma parenchyma. Altered perfusion related to the biliary obstruction.   12/23/2017 Procedure   12/23/2017 Colonoscopy Impressions: - Decreased sphincter tone found on digital rectal exam. - Two 3 mm polyps in the rectum and in the descending colon, removed with a cold snare. Resected and retrieved. - One 8 mm polyp in the rectum, removed with a hot snare. Resected and retrieved. - Diverticulosis in the entire examined colon. - The examination was otherwise normal on direct and retroflexion views.   08/24/2018 Imaging   CT AP  IMPRESSION: 1. Over the last 9 months there has been a similar not significantly progressive appearance of biliary dilatation and complexity in segment 5 of the liver along with mild intrahepatic biliary dilatation elsewhere, and moderate extrahepatic biliary dilatation. Some of this is likely postinflammatory; the most complex and dilated bile duct did not have definite enhancement on the recent MRI to definitively indicate cholangiocarcinoma or metastatic lesion but the appearance of the right hepatic lobe is progressive from 09/19/2016 and from 04/27/2017. In the context of the patient's colon cancer, nuclear medicine PET-CT may provide some helpful adjunct information given this unusual appearance. 2. Other imaging findings of potential clinical significance: Mitral calcification. Aortoiliac atherosclerotic vascular disease. Renal cysts. Trace amount of gas in the urinary bladder, query recent catheterization. Descending and sigmoid colon diverticulosis. Two ventral hernias are enlarged compared to the prior exam but lax with wide ostium. No overt pathologic adenopathy currently.   08/24/2019 Imaging   CT CAP w contrast  IMPRESSION: 1. Right hemicolectomy. No evidence of recurrent or metastatic disease. 2. Intrahepatic/extrahepatic biliary ductal dilatation, unchanged from 11/25/2017. 3. Multiple midline supraumbilical ventral hernias  contain unobstructed bowel or fat. 4. Aortic atherosclerosis (ICD10-I70.0). Coronary artery calcification.   09/20/2020 Imaging   MRI Abdomen  IMPRESSION: 1. Irregular peripheral enhancement and progressive enhancement of the lesion along the hepatic margin suspicious for peribiliary spread of colonic neoplasm (metastatic disease from colon primary) given findings on prior studies versus is cholangiocarcinoma. 2. Central biliary cast and stones filling the dilated common bile duct with diffuse biliary duct dilation. No visible enhancement to suggest tumor in these areas though assessment is limited due to clip artifact. Tumor extending into the RIGHT hepatic duct is difficult to exclude given the presence of clip artifact but on image 26 of series 33 there is suggestion of enhancing material within the lumen. 3. Slight increase in size of celiac lymph nodes dating back to 2021, suspicious in light of other findings. No retroperitoneal adenopathy.   12/04/2020 Imaging   MRI Abdomen  IMPRESSION: 1. Interval extraction of multiple common bile duct calculi with persistent intra and extrahepatic biliary ductal dilatation, similar in caliber to prior examination.   2. A large calculus remains in the intrahepatic common bile duct measuring at least 2.6 cm.   3. No significant change in heterogeneously enhancing, somewhat ill-defined lesions of the anterior right lobe of the liver, hepatic segment V, or within the central liver. As on prior examination, these remain consistent with either metastases from patient's known colon malignancy or alternately cholangiocarcinoma.      INTERVAL HISTORY:  Ashley Peters was contacted for a phone visit to follow up and review her PET scan results. She was last seen by me on 12/11/20. She is doing well overall. She has occasional abdominal cramps, no other complains.    All other systems were reviewed with the patient and are  negative.  MEDICAL  HISTORY:  Past Medical History:  Diagnosis Date   Anemia    Blood transfusion without reported diagnosis    Cancer ([place])    Colon cancer ([place]) dx'd 10/2016   Cystoid macular edema of left eye 07/05/2019   Diverticulosis    Family history of breast cancer    Family history of colon cancer    Family history of uterine cancer    GERD (gastroesophageal reflux disease)    History of kidney stones    Hyperlipidemia    Hypertension    Macular degeneration    Osteopenia     SURGICAL HISTORY: Past Surgical History:  Procedure Laterality Date   ABDOMINAL HYSTERECTOMY     ablation tr Great saphenous vein  10/17/2009   BREAST EXCISIONAL BIOPSY Bilateral 1971   brest biopsy  1971   CHOLECYSTECTOMY     EPIGASTRIC HERNIA REPAIR N/A 10/14/2016   Procedure: REPAIR OF EPIGASTRIC VENTRAL HERNIA;  Surgeon: Donnie Mesa, MD;  Location: [facility];  Service: General;  Laterality: N/A;   ERCP N/A 09/21/2020   Procedure: ENDOSCOPIC RETROGRADE CHOLANGIOPANCREATOGRAPHY (ERCP);  Surgeon: Gatha Mayer, MD;  Location: [facility];  Service: Endoscopy;  Laterality: N/A;   lt. distal ureteral stone extraction  09/14/1991   PARTIAL COLECTOMY Right 10/14/2016   Procedure: OPEN RIGHT HEMICOLECTOMY;  Surgeon: Donnie Mesa, MD;  Location: [facility];  Service: General;  Laterality: Right;   REMOVAL OF STONES  09/21/2020   Procedure: REMOVAL OF STONES;  Surgeon: Gatha Mayer, MD;  Location: [facility];  Service: Endoscopy;;   SPHINCTEROTOMY  09/21/2020   Procedure: [name] Mayans;  Surgeon: Gatha Mayer, MD;  Location: [facility];  Service: Endoscopy;;   STONE EXTRACTION WITH BASKET  09/21/2020   Procedure: STONE EXTRACTION WITH BASKET;  Surgeon: Gatha Mayer, MD;  Location: [facility];  Service: Endoscopy;;    I have reviewed the social history and family history with the patient and they are unchanged from previous note.  ALLERGIES:  is allergic to benicar [olmesartan medoxomil] and  amoxicillin.  MEDICATIONS:  Current Outpatient Medications  Medication Sig Dispense Refill   acetaminophen (TYLENOL) 500 MG tablet Take 2 tablets (1,000 mg total) by mouth every 6 (six) hours as needed for moderate pain. 30 tablet 0   alendronate (FOSAMAX) 70 MG tablet Take with a full glass of water on an empty stomach. 12 tablet 3   Multiple Vitamins-Minerals (PRESERVISION AREDS 2 PO) Take 1 capsule by mouth 2 (two) times daily.     Current Facility-Administered Medications  Medication Dose Route Frequency Provider Last Rate Last Admin   cyanocobalamin ((VITAMIN B-12)) injection 1,000 mcg  1,000 mcg Intramuscular Q30 days Dettinger, [name], MD   1,000 mcg at 12/14/20 0945    PHYSICAL EXAMINATION: ECOG PERFORMANCE STATUS: 1 - Symptomatic but completely ambulatory  There were no vitals filed for this visit. Wt Readings from Last 3 Encounters:  12/10/20 113 lb 9.6 oz (51.5 kg)  11/21/20 116 lb (52.6 kg)  11/02/20 118 lb 8 oz (53.8 kg)     No vitals taken today, Exam not performed today  LABORATORY DATA:  I have reviewed the data as listed CBC Latest Ref Rng & Units 01/01/2021 12/10/2020 11/02/2020  WBC 4.0 - 10.5 K/uL 6.4 8.6 5.8  Hemoglobin 12.0 - 15.0 g/dL 13.7 13.5 13.3  Hematocrit 36.0 - 46.0 % 43.0 41.4 41.0  Platelets 150 - 400 K/uL 190 327 195     CMP Latest Ref Rng & Units  01/01/2021 12/10/2020 11/02/2020  Glucose 70 - 99 mg/dL 91 110(H) 107(H)  BUN 8 - 23 mg/dL '16 14 18  ' Creatinine 0.44 - 1.00 mg/dL 0.76 0.90 0.81  Sodium 135 - 145 mmol/L 141 140 142  Potassium 3.5 - 5.1 mmol/L 4.1 4.1 4.9  Chloride 98 - 111 mmol/L 106 103 109  CO2 22 - 32 mmol/L '23 26 26  ' Calcium 8.9 - 10.3 mg/dL 9.6 10.2 9.5  Total Protein 6.5 - 8.1 g/dL 6.9 7.4 6.8  Total Bilirubin 0.3 - 1.2 mg/dL 1.0 2.0(H) 0.6  Alkaline Phos 38 - 126 U/L 74 373(H) 73  AST 15 - 41 U/L 19 216(HH) 15  ALT 0 - 44 U/L 11 188(H) 10      RADIOGRAPHIC STUDIES: I have personally reviewed the radiological  images as listed and agreed with the findings in the report. No results found.    No orders of the defined types were placed in this encounter.  All questions were answered. The patient knows to call the clinic with any problems, questions or concerns. No barriers to learning was detected. The total time spent in the appointment was 15 minutes.     Truitt Merle, MD 01/04/2021   I, Wilburn Mylar, am acting as scribe for Truitt Merle, MD.   I have reviewed the above documentation for accuracy and completeness, and I agree with the above.

## 2021-01-07 ENCOUNTER — Other Ambulatory Visit: Payer: TRICARE For Life (TFL)

## 2021-01-07 ENCOUNTER — Telehealth: Payer: Self-pay | Admitting: Hematology

## 2021-01-07 ENCOUNTER — Ambulatory Visit: Payer: TRICARE For Life (TFL) | Admitting: Hematology

## 2021-01-07 ENCOUNTER — Other Ambulatory Visit: Payer: Self-pay

## 2021-01-07 NOTE — Telephone Encounter (Signed)
Scheduled follow-up appointments per 10/28 los. Patient is aware. 

## 2021-01-08 ENCOUNTER — Ambulatory Visit: Payer: TRICARE For Life (TFL) | Admitting: Hematology

## 2021-01-08 ENCOUNTER — Other Ambulatory Visit: Payer: TRICARE For Life (TFL)

## 2021-01-09 ENCOUNTER — Telehealth: Payer: Self-pay | Admitting: Internal Medicine

## 2021-01-09 NOTE — Telephone Encounter (Signed)
Please contact patient and/or daughter about coming in for an appointment to discuss possible ERCP and removal of a persistent stone in the bile duct.  I can work her in on an 1130 I think there is 1 open next week or 1130 or 350 in following weeks.  I would like to see her sometime in the next couple of weeks

## 2021-01-09 NOTE — Telephone Encounter (Signed)
Pt Daughter Donah Driver made aware of Dr. Carlean Purl Recommendations. Pt scheduled for a Follow up Appointment with Dr. Carlean Purl on 01/23/2021 @ 11:30. [place] made aware  Donah Driver verbalized understanding with all questions answered.

## 2021-01-15 ENCOUNTER — Other Ambulatory Visit: Payer: Self-pay

## 2021-01-15 ENCOUNTER — Other Ambulatory Visit: Payer: Self-pay | Admitting: Family Medicine

## 2021-01-15 ENCOUNTER — Ambulatory Visit (INDEPENDENT_AMBULATORY_CARE_PROVIDER_SITE_OTHER): Payer: Medicare Other | Admitting: *Deleted

## 2021-01-15 DIAGNOSIS — E538 Deficiency of other specified B group vitamins: Secondary | ICD-10-CM | POA: Diagnosis not present

## 2021-01-15 DIAGNOSIS — Z1231 Encounter for screening mammogram for malignant neoplasm of breast: Secondary | ICD-10-CM

## 2021-01-15 NOTE — Progress Notes (Signed)
Pt tolerated B12 injection in right deltoid well

## 2021-01-23 ENCOUNTER — Other Ambulatory Visit (INDEPENDENT_AMBULATORY_CARE_PROVIDER_SITE_OTHER): Payer: Medicare Other

## 2021-01-23 ENCOUNTER — Encounter: Payer: Self-pay | Admitting: Internal Medicine

## 2021-01-23 ENCOUNTER — Ambulatory Visit (INDEPENDENT_AMBULATORY_CARE_PROVIDER_SITE_OTHER): Payer: Medicare Other | Admitting: Internal Medicine

## 2021-01-23 VITALS — BP 144/78 | HR 67 | Ht 60.0 in | Wt 119.0 lb

## 2021-01-23 DIAGNOSIS — K8050 Calculus of bile duct without cholangitis or cholecystitis without obstruction: Secondary | ICD-10-CM

## 2021-01-23 DIAGNOSIS — C182 Malignant neoplasm of ascending colon: Secondary | ICD-10-CM | POA: Diagnosis not present

## 2021-01-23 LAB — HEPATIC FUNCTION PANEL
ALT: 9 U/L (ref 0–35)
AST: 14 U/L (ref 0–37)
Albumin: 4.1 g/dL (ref 3.5–5.2)
Alkaline Phosphatase: 58 U/L (ref 39–117)
Bilirubin, Direct: 0 mg/dL (ref 0.0–0.3)
Total Bilirubin: 0.4 mg/dL (ref 0.2–1.2)
Total Protein: 6.9 g/dL (ref 6.0–8.3)

## 2021-01-23 NOTE — Progress Notes (Signed)
Ashley Peters 87 y.o. 03/21/1928 [id number]  Assessment & Plan:   Encounter Diagnoses  Name Primary?   Choledocholithiasis Yes   Cancer of right colon ([place])      Ideally she should have this cast stone removed from her bile duct.  I suspect she had sludge or some other stone material impacting their the other month when her LFTs went up.  Orders Placed This Encounter  Procedures   Hepatic function panel   I have explained that this stone can be removed with contact lithotripsy.  I will ask my partner Dr. Rush Peters to perform this procedure since he is our local expert with these techniques.  Patient and daughter are aware that we will contact them with a date and time for the procedure.  We had a discussion about signs of biliary obstructions and problems to contact us or seek health care for as well.   I appreciate the opportunity to care for this patient. CC: Ashley Peters, Ashley Kaufmann, MD Dr. Truitt Peters   Subjective:   Chief Complaint: Choledocholithiasis  HPI 87 year old woman with right colon cancer, stage IIa and a history of choledocholithiasis that I treated in the summer with ERCP.  In the interim she had a bump in her LFTs and imaging showed a bile duct cast stone.  Subsequently PET scan was done and though she had some hypermetabolic lesions in the liver they were not thought to correlate with changes in the CT scan i.e. did not have clear signs of liver metastases from colon cancer.  There is a lung nodule that needs follow-up.  Subsequently her LFTs normalized.  She was never ill with this recent elevation in LFTs.  She feels well today.  Lab Results  Component Value Date   ALT 11 01/01/2021   AST 19 01/01/2021   ALKPHOS 74 01/01/2021   BILITOT 1.0 01/01/2021    PET scan 01/02/2021 IMPRESSION: 1. 9 mm ground-glass nodule in the posterior right lower lobe is hypermetabolic. This nodule is new since 09/19/2020 while potentially infectious/inflammatory, primary  bronchogenic neoplasm or metastatic disease could have this appearance. Close follow-up recommended. 2. 2 areas of low level hypermetabolism in the liver without obvious underlying correlate on CT imaging. Metastatic disease remains a concern. 3.  Aortic Atherosclerois (ICD10-170.0)   12/10/2020  Component Ref Range & Units 1 mo ago   CA 19-9 0 - 35 U/mL 53 High     5.0 g/dL 3.3 Low   3.7  4.1 R  3.2 Low   2.6 Low   2.8 Low    3.3 Low     AST 15 - 41 U/L 216 High Panic   15  52 High  R  17  47 High   130 High    271 High    Comment: REPEATED TO VERIFY  CRITICAL RESULT CALLED TO, READ BACK BY AND VERIFIED WITH: Ashley SILAS RN @ 1508 BY Ashley Peters [id number]   ALT 0 - 44 U/L 188 High   10  35 High  R  38  155 High   239 High    356 High    Alkaline Phosphatase 38 - 126 U/L 373 High   73  256 High  R  152 High   231 High   265 High    323 High    Total Bilirubin 0.3 - 1.2 mg/dL 2.0 High             MRI 12/05/2020 IMPRESSION: 1. Interval extraction  of multiple common bile duct calculi with persistent intra and extrahepatic biliary ductal dilatation, similar in caliber to prior examination.   2. A large calculus remains in the intrahepatic common bile duct measuring at least 2.6 cm.   3. No significant change in heterogeneously enhancing, somewhat ill-defined lesions of the anterior right lobe of the liver, hepatic segment V, or within the central liver. As on prior examination, these remain consistent with either metastases from patient's known colon malignancy or alternately cholangiocarcinoma.    Allergies  Allergen Reactions   Benicar [Olmesartan Medoxomil] Other (See Comments)    Elevated Potassium   Amoxicillin Rash    Has patient had a PCN reaction causing immediate rash, facial/tongue/throat swelling, SOB or lightheadedness with hypotension: Yes Has patient had a PCN reaction causing severe rash involving mucus membranes or skin necrosis: No Has patient had a PCN reaction  that required hospitalization: No Has patient had a PCN reaction occurring within the last 10 years: No If all of the above answers are "NO", then may proceed with Cephalosporin use.   Current Meds  Medication Sig   acetaminophen (TYLENOL) 500 MG tablet Take 2 tablets (1,000 mg total) by mouth every 6 (six) hours as needed for moderate pain.   alendronate (FOSAMAX) 70 MG tablet Take with a full glass of water on an empty stomach.   Multiple Vitamins-Minerals (PRESERVISION AREDS 2 PO) Take 1 capsule by mouth 2 (two) times daily.   Current Facility-Administered Medications for the 01/23/21 encounter (Office Visit) with Ashley Mayer, MD  Medication   cyanocobalamin ((VITAMIN B-12)) injection 1,000 mcg   Past Medical History:  Diagnosis Date   Anemia    Blood transfusion without reported diagnosis    Cancer ([place])    Colon cancer ([place]) dx'd 10/2016   Cystoid macular edema of left eye 07/05/2019   Diverticulosis    Family history of breast cancer    Family history of colon cancer    Family history of uterine cancer    GERD (gastroesophageal reflux disease)    History of kidney stones    Hyperlipidemia    Hypertension    Macular degeneration    Osteopenia    Past Surgical History:  Procedure Laterality Date   ABDOMINAL HYSTERECTOMY     ablation tr Great saphenous vein  10/17/2009   BREAST EXCISIONAL BIOPSY Bilateral 1971   brest biopsy  1971   CHOLECYSTECTOMY     EPIGASTRIC HERNIA REPAIR N/A 10/14/2016   Procedure: REPAIR OF EPIGASTRIC VENTRAL HERNIA;  Surgeon: Donnie Mesa, MD;  Location: [facility];  Service: General;  Laterality: N/A;   ERCP N/A 09/21/2020   Procedure: ENDOSCOPIC RETROGRADE CHOLANGIOPANCREATOGRAPHY (ERCP);  Surgeon: Ashley Mayer, MD;  Location: [facility];  Service: Endoscopy;  Laterality: N/A;   lt. distal ureteral stone extraction  09/14/1991   PARTIAL COLECTOMY Right 10/14/2016   Procedure: OPEN RIGHT HEMICOLECTOMY;  Surgeon: Donnie Mesa, MD;  Location:  [facility];  Service: General;  Laterality: Right;   REMOVAL OF STONES  09/21/2020   Procedure: REMOVAL OF STONES;  Surgeon: Ashley Mayer, MD;  [facility];  Service: Endoscopy;;   SPHINCTEROTOMY  09/21/2020   Procedure: Ashley Peters Mayans;  Surgeon: Ashley Mayer, MD;  [facility];  Service: Endoscopy;;   STONE EXTRACTION WITH BASKET  09/21/2020   Procedure: STONE EXTRACTION WITH BASKET;  Surgeon: Ashley Mayer, MD;  Location: [facility];  Service: Endoscopy;;   Social History   Social History Narrative   Lives alone -  daughter and son live 2 miles away   family history includes Breast cancer in her sister and another family member; Colon cancer (age of onset: 28) in an other family member; Diabetes in her brother; Epilepsy in her son; Heart disease in her brother; Hodgkin's lymphoma in an other family member; Kidney disease in her brother; Seizures in her son; Stroke (age of onset: 66) in her father; Uterine cancer (age of onset: 89) in an other family member.   Review of Systems  As per HPI Objective:   Physical Exam BP (!) 144/78   Pulse 67   Ht 5' (1.524 m)   Wt 119 lb (54 kg)   BMI 23.24 kg/m  [place] elderly white woman in no acute distress Lungs are clear Heart sounds are normal The abdomen is soft and nontender

## 2021-01-23 NOTE — Patient Instructions (Addendum)
Please go to the lab today.  I will talk to Dr. Rush Landmark and we will contact you about a date and time for the ERCP to remove the stone in the bile duct.  I appreciate the opportunity to care for you. Silvano Rusk, MD

## 2021-01-28 ENCOUNTER — Other Ambulatory Visit: Payer: Self-pay

## 2021-01-28 DIAGNOSIS — K8050 Calculus of bile duct without cholangitis or cholecystitis without obstruction: Secondary | ICD-10-CM

## 2021-02-14 ENCOUNTER — Telehealth: Payer: Self-pay | Admitting: Gastroenterology

## 2021-02-14 ENCOUNTER — Ambulatory Visit (INDEPENDENT_AMBULATORY_CARE_PROVIDER_SITE_OTHER): Payer: Medicare Other | Admitting: *Deleted

## 2021-02-14 DIAGNOSIS — E538 Deficiency of other specified B group vitamins: Secondary | ICD-10-CM

## 2021-02-14 NOTE — Telephone Encounter (Signed)
Vaughan Basta I have sent this to [facility].  She is a Dr Carlean Purl pt.  Thanks

## 2021-02-14 NOTE — Telephone Encounter (Signed)
Returned call to patient's daughter, and she states Dr. Carlean Purl asked her to call us if her Mom developed dark urine and/or jaundice. Patient has dark urine but is Not jaundice.

## 2021-02-14 NOTE — Telephone Encounter (Signed)
Patient's daughter, Donah Driver, called and stated for the last two days patient has been having dark urine.  She is not in any pain and doesn't appear to be jaundiced; however, she was told if she experienced any symptoms at all to call them in.  She'd like to know how to proceed or what she should do next for patient.  Please call and advise.  Thank you.

## 2021-02-15 ENCOUNTER — Other Ambulatory Visit (INDEPENDENT_AMBULATORY_CARE_PROVIDER_SITE_OTHER): Payer: Medicare Other

## 2021-02-15 ENCOUNTER — Other Ambulatory Visit: Payer: Self-pay

## 2021-02-15 ENCOUNTER — Ambulatory Visit
Admission: RE | Admit: 2021-02-15 | Discharge: 2021-02-15 | Disposition: A | Discharge status: Home / Self Care | Payer: Medicare Other | Source: Ambulatory Visit | Attending: Family Medicine | Admitting: Family Medicine

## 2021-02-15 DIAGNOSIS — Z1231 Encounter for screening mammogram for malignant neoplasm of breast: Secondary | ICD-10-CM | POA: Diagnosis not present

## 2021-02-15 DIAGNOSIS — K8050 Calculus of bile duct without cholangitis or cholecystitis without obstruction: Secondary | ICD-10-CM | POA: Diagnosis not present

## 2021-02-15 LAB — HEPATIC FUNCTION PANEL
ALT: 143 U/L — ABNORMAL HIGH (ref 0–35)
AST: 105 U/L — ABNORMAL HIGH (ref 0–37)
Albumin: 3.7 g/dL (ref 3.5–5.2)
Alkaline Phosphatase: 414 U/L — ABNORMAL HIGH (ref 39–117)
Bilirubin, Direct: 4.6 mg/dL — ABNORMAL HIGH (ref 0.0–0.3)
Total Bilirubin: 6.7 mg/dL — ABNORMAL HIGH (ref 0.2–1.2)
Total Protein: 6.8 g/dL (ref 6.0–8.3)

## 2021-02-15 NOTE — Telephone Encounter (Signed)
Order for lab entered into Epic: Pt Daughter Donah Driver notified of Dr. Carlean Purl Recommendations and that the pt needs to come and get that that lab drawn. Donah Driver stated that they will come today.  Serita verbalized understanding with all questions answered.

## 2021-02-15 NOTE — Telephone Encounter (Signed)
Please have her do LFT's  Dx choledocholithiasis

## 2021-02-16 ENCOUNTER — Telehealth: Payer: Self-pay | Admitting: Gastroenterology

## 2021-02-16 ENCOUNTER — Other Ambulatory Visit: Payer: Self-pay | Admitting: Internal Medicine

## 2021-02-16 DIAGNOSIS — K8050 Calculus of bile duct without cholangitis or cholecystitis without obstruction: Secondary | ICD-10-CM

## 2021-02-16 NOTE — Telephone Encounter (Signed)
Dr. Carlean Purl addressed phone call.

## 2021-02-19 ENCOUNTER — Other Ambulatory Visit: Payer: Self-pay

## 2021-02-19 ENCOUNTER — Other Ambulatory Visit (INDEPENDENT_AMBULATORY_CARE_PROVIDER_SITE_OTHER): Payer: Medicare Other

## 2021-02-19 DIAGNOSIS — R17 Unspecified jaundice: Secondary | ICD-10-CM

## 2021-02-19 DIAGNOSIS — K8050 Calculus of bile duct without cholangitis or cholecystitis without obstruction: Secondary | ICD-10-CM

## 2021-02-19 DIAGNOSIS — C189 Malignant neoplasm of colon, unspecified: Secondary | ICD-10-CM

## 2021-02-19 DIAGNOSIS — C182 Malignant neoplasm of ascending colon: Secondary | ICD-10-CM

## 2021-02-19 LAB — HEPATIC FUNCTION PANEL
ALT: 177 U/L — ABNORMAL HIGH (ref 0–35)
AST: 165 U/L — ABNORMAL HIGH (ref 0–37)
Albumin: 3.9 g/dL (ref 3.5–5.2)
Alkaline Phosphatase: 400 U/L — ABNORMAL HIGH (ref 39–117)
Bilirubin, Direct: 6.4 mg/dL — ABNORMAL HIGH (ref 0.0–0.3)
Total Bilirubin: 10.1 mg/dL — ABNORMAL HIGH (ref 0.2–1.2)
Total Protein: 7 g/dL (ref 6.0–8.3)

## 2021-02-19 LAB — BASIC METABOLIC PANEL
BUN: 16 mg/dL (ref 6–23)
CO2: 25 mEq/L (ref 19–32)
Calcium: 10.1 mg/dL (ref 8.4–10.5)
Chloride: 102 mEq/L (ref 96–112)
Creatinine, Ser: 0.89 mg/dL (ref 0.40–1.20)
GFR: 57.05 mL/min — ABNORMAL LOW (ref >60.00)
Glucose, Bld: 121 mg/dL — ABNORMAL HIGH (ref 70–99)
Potassium: 4.2 mEq/L (ref 3.5–5.1)
Sodium: 139 mEq/L (ref 135–145)

## 2021-02-19 NOTE — Addendum Note (Signed)
Addended by: Gillermina Hu on: 02/19/2021 02:56 PM   Modules accepted: Orders

## 2021-02-19 NOTE — Addendum Note (Signed)
Addended by: Susy Manor on: [id number]/6812 03:52 PM   Modules accepted: Orders

## 2021-02-22 ENCOUNTER — Ambulatory Visit: Payer: Medicare Other | Admitting: Family Medicine

## 2021-02-23 ENCOUNTER — Ambulatory Visit (HOSPITAL_COMMUNITY): Payer: Medicare Other

## 2021-02-23 ENCOUNTER — Ambulatory Visit (HOSPITAL_COMMUNITY)
Admission: RE | Admit: 2021-02-23 | Discharge: 2021-02-23 | Disposition: A | Discharge status: Home / Self Care | Payer: Medicare Other | Source: Ambulatory Visit | Attending: Internal Medicine | Admitting: Internal Medicine

## 2021-02-23 ENCOUNTER — Other Ambulatory Visit: Payer: Self-pay | Admitting: Internal Medicine

## 2021-02-23 DIAGNOSIS — K8050 Calculus of bile duct without cholangitis or cholecystitis without obstruction: Secondary | ICD-10-CM

## 2021-02-23 DIAGNOSIS — Z880 Allergy status to penicillin: Secondary | ICD-10-CM | POA: Diagnosis not present

## 2021-02-23 DIAGNOSIS — Z9221 Personal history of antineoplastic chemotherapy: Secondary | ICD-10-CM | POA: Diagnosis not present

## 2021-02-23 DIAGNOSIS — R17 Unspecified jaundice: Secondary | ICD-10-CM

## 2021-02-23 DIAGNOSIS — K219 Gastro-esophageal reflux disease without esophagitis: Secondary | ICD-10-CM | POA: Diagnosis not present

## 2021-02-23 DIAGNOSIS — K7689 Other specified diseases of liver: Secondary | ICD-10-CM | POA: Diagnosis not present

## 2021-02-23 DIAGNOSIS — C189 Malignant neoplasm of colon, unspecified: Secondary | ICD-10-CM

## 2021-02-23 DIAGNOSIS — R160 Hepatomegaly, not elsewhere classified: Secondary | ICD-10-CM | POA: Diagnosis not present

## 2021-02-23 DIAGNOSIS — D500 Iron deficiency anemia secondary to blood loss (chronic): Secondary | ICD-10-CM | POA: Diagnosis not present

## 2021-02-23 DIAGNOSIS — C229 Malignant neoplasm of liver, not specified as primary or secondary: Secondary | ICD-10-CM | POA: Diagnosis not present

## 2021-02-23 DIAGNOSIS — H3530 Unspecified macular degeneration: Secondary | ICD-10-CM | POA: Diagnosis not present

## 2021-02-23 DIAGNOSIS — E538 Deficiency of other specified B group vitamins: Secondary | ICD-10-CM | POA: Diagnosis not present

## 2021-02-23 DIAGNOSIS — D630 Anemia in neoplastic disease: Secondary | ICD-10-CM | POA: Diagnosis not present

## 2021-02-23 DIAGNOSIS — Z7983 Long term (current) use of bisphosphonates: Secondary | ICD-10-CM | POA: Diagnosis not present

## 2021-02-23 DIAGNOSIS — Z9889 Other specified postprocedural states: Secondary | ICD-10-CM | POA: Diagnosis not present

## 2021-02-23 DIAGNOSIS — K838 Other specified diseases of biliary tract: Secondary | ICD-10-CM | POA: Diagnosis not present

## 2021-02-23 DIAGNOSIS — C240 Malignant neoplasm of extrahepatic bile duct: Secondary | ICD-10-CM | POA: Diagnosis not present

## 2021-02-23 DIAGNOSIS — R7989 Other specified abnormal findings of blood chemistry: Secondary | ICD-10-CM | POA: Diagnosis not present

## 2021-02-23 DIAGNOSIS — K8031 Calculus of bile duct with cholangitis, unspecified, with obstruction: Secondary | ICD-10-CM | POA: Diagnosis not present

## 2021-02-23 DIAGNOSIS — Z85038 Personal history of other malignant neoplasm of large intestine: Secondary | ICD-10-CM | POA: Diagnosis not present

## 2021-02-23 DIAGNOSIS — R932 Abnormal findings on diagnostic imaging of liver and biliary tract: Secondary | ICD-10-CM | POA: Diagnosis not present

## 2021-02-23 DIAGNOSIS — D490 Neoplasm of unspecified behavior of digestive system: Secondary | ICD-10-CM | POA: Diagnosis not present

## 2021-02-23 DIAGNOSIS — Z9049 Acquired absence of other specified parts of digestive tract: Secondary | ICD-10-CM | POA: Diagnosis not present

## 2021-02-23 DIAGNOSIS — C221 Intrahepatic bile duct carcinoma: Secondary | ICD-10-CM | POA: Diagnosis not present

## 2021-02-23 DIAGNOSIS — Z20822 Contact with and (suspected) exposure to covid-19: Secondary | ICD-10-CM | POA: Diagnosis not present

## 2021-02-23 DIAGNOSIS — I10 Essential (primary) hypertension: Secondary | ICD-10-CM | POA: Diagnosis not present

## 2021-02-23 DIAGNOSIS — K831 Obstruction of bile duct: Secondary | ICD-10-CM | POA: Diagnosis not present

## 2021-02-23 DIAGNOSIS — K769 Liver disease, unspecified: Secondary | ICD-10-CM | POA: Diagnosis not present

## 2021-02-23 DIAGNOSIS — E559 Vitamin D deficiency, unspecified: Secondary | ICD-10-CM | POA: Diagnosis not present

## 2021-02-23 DIAGNOSIS — D649 Anemia, unspecified: Secondary | ICD-10-CM | POA: Diagnosis not present

## 2021-02-23 DIAGNOSIS — E785 Hyperlipidemia, unspecified: Secondary | ICD-10-CM | POA: Diagnosis not present

## 2021-02-23 DIAGNOSIS — K839 Disease of biliary tract, unspecified: Secondary | ICD-10-CM | POA: Diagnosis not present

## 2021-02-23 DIAGNOSIS — Z800 Family history of malignant neoplasm of digestive organs: Secondary | ICD-10-CM | POA: Diagnosis not present

## 2021-02-23 DIAGNOSIS — Z888 Allergy status to other drugs, medicaments and biological substances status: Secondary | ICD-10-CM | POA: Diagnosis not present

## 2021-02-23 DIAGNOSIS — Z9689 Presence of other specified functional implants: Secondary | ICD-10-CM | POA: Diagnosis not present

## 2021-02-23 DIAGNOSIS — Z8249 Family history of ischemic heart disease and other diseases of the circulatory system: Secondary | ICD-10-CM | POA: Diagnosis not present

## 2021-02-23 DIAGNOSIS — Z79899 Other long term (current) drug therapy: Secondary | ICD-10-CM | POA: Diagnosis not present

## 2021-02-23 MED ORDER — GADOBUTROL 1 MMOL/ML IV SOLN
5.0000 mL | Freq: Once | INTRAVENOUS | Status: AC | PRN
  Administered 2021-02-23: 5 mL via INTRAVENOUS

## 2021-02-25 ENCOUNTER — Encounter (HOSPITAL_COMMUNITY): Payer: Self-pay

## 2021-02-25 ENCOUNTER — Other Ambulatory Visit: Payer: Self-pay | Admitting: Gastroenterology

## 2021-02-25 ENCOUNTER — Inpatient Hospital Stay (HOSPITAL_COMMUNITY)
Admission: EM | Admit: 2021-02-25 | Discharge: 2021-03-01 | DRG: 445 | Disposition: A | Discharge status: Home / Self Care | Payer: Medicare Other | Attending: Family Medicine | Admitting: Family Medicine

## 2021-02-25 ENCOUNTER — Other Ambulatory Visit: Payer: Self-pay

## 2021-02-25 DIAGNOSIS — D500 Iron deficiency anemia secondary to blood loss (chronic): Secondary | ICD-10-CM | POA: Diagnosis not present

## 2021-02-25 DIAGNOSIS — Z8249 Family history of ischemic heart disease and other diseases of the circulatory system: Secondary | ICD-10-CM | POA: Diagnosis not present

## 2021-02-25 DIAGNOSIS — E538 Deficiency of other specified B group vitamins: Secondary | ICD-10-CM | POA: Diagnosis present

## 2021-02-25 DIAGNOSIS — I10 Essential (primary) hypertension: Secondary | ICD-10-CM | POA: Diagnosis present

## 2021-02-25 DIAGNOSIS — Z9689 Presence of other specified functional implants: Secondary | ICD-10-CM | POA: Diagnosis not present

## 2021-02-25 DIAGNOSIS — R160 Hepatomegaly, not elsewhere classified: Secondary | ICD-10-CM | POA: Diagnosis not present

## 2021-02-25 DIAGNOSIS — Z20822 Contact with and (suspected) exposure to covid-19: Secondary | ICD-10-CM | POA: Diagnosis present

## 2021-02-25 DIAGNOSIS — K831 Obstruction of bile duct: Secondary | ICD-10-CM

## 2021-02-25 DIAGNOSIS — K219 Gastro-esophageal reflux disease without esophagitis: Secondary | ICD-10-CM | POA: Diagnosis present

## 2021-02-25 DIAGNOSIS — K7689 Other specified diseases of liver: Secondary | ICD-10-CM | POA: Diagnosis not present

## 2021-02-25 DIAGNOSIS — R932 Abnormal findings on diagnostic imaging of liver and biliary tract: Secondary | ICD-10-CM | POA: Diagnosis not present

## 2021-02-25 DIAGNOSIS — C221 Intrahepatic bile duct carcinoma: Secondary | ICD-10-CM

## 2021-02-25 DIAGNOSIS — Z9221 Personal history of antineoplastic chemotherapy: Secondary | ICD-10-CM | POA: Diagnosis not present

## 2021-02-25 DIAGNOSIS — R7989 Other specified abnormal findings of blood chemistry: Secondary | ICD-10-CM | POA: Diagnosis not present

## 2021-02-25 DIAGNOSIS — Z7983 Long term (current) use of bisphosphonates: Secondary | ICD-10-CM | POA: Diagnosis not present

## 2021-02-25 DIAGNOSIS — Z9889 Other specified postprocedural states: Secondary | ICD-10-CM | POA: Diagnosis not present

## 2021-02-25 DIAGNOSIS — Z888 Allergy status to other drugs, medicaments and biological substances status: Secondary | ICD-10-CM

## 2021-02-25 DIAGNOSIS — E785 Hyperlipidemia, unspecified: Secondary | ICD-10-CM | POA: Diagnosis present

## 2021-02-25 DIAGNOSIS — D490 Neoplasm of unspecified behavior of digestive system: Secondary | ICD-10-CM | POA: Diagnosis not present

## 2021-02-25 DIAGNOSIS — Z85038 Personal history of other malignant neoplasm of large intestine: Secondary | ICD-10-CM

## 2021-02-25 DIAGNOSIS — D630 Anemia in neoplastic disease: Secondary | ICD-10-CM | POA: Diagnosis present

## 2021-02-25 DIAGNOSIS — C240 Malignant neoplasm of extrahepatic bile duct: Secondary | ICD-10-CM | POA: Diagnosis not present

## 2021-02-25 DIAGNOSIS — E559 Vitamin D deficiency, unspecified: Secondary | ICD-10-CM | POA: Diagnosis not present

## 2021-02-25 DIAGNOSIS — Z880 Allergy status to penicillin: Secondary | ICD-10-CM | POA: Diagnosis not present

## 2021-02-25 DIAGNOSIS — K839 Disease of biliary tract, unspecified: Secondary | ICD-10-CM | POA: Diagnosis not present

## 2021-02-25 DIAGNOSIS — Z800 Family history of malignant neoplasm of digestive organs: Secondary | ICD-10-CM

## 2021-02-25 DIAGNOSIS — Z79899 Other long term (current) drug therapy: Secondary | ICD-10-CM

## 2021-02-25 DIAGNOSIS — K838 Other specified diseases of biliary tract: Secondary | ICD-10-CM | POA: Diagnosis not present

## 2021-02-25 DIAGNOSIS — Z9049 Acquired absence of other specified parts of digestive tract: Secondary | ICD-10-CM

## 2021-02-25 DIAGNOSIS — H3530 Unspecified macular degeneration: Secondary | ICD-10-CM | POA: Diagnosis present

## 2021-02-25 DIAGNOSIS — D649 Anemia, unspecified: Secondary | ICD-10-CM | POA: Diagnosis not present

## 2021-02-25 DIAGNOSIS — K8031 Calculus of bile duct with cholangitis, unspecified, with obstruction: Secondary | ICD-10-CM | POA: Diagnosis not present

## 2021-02-25 LAB — CBC WITH DIFFERENTIAL/PLATELET
Abs Immature Granulocytes: 0.05 10*3/uL (ref 0.00–0.07)
Basophils Absolute: 0.1 10*3/uL (ref 0.0–0.1)
Basophils Relative: 1 %
Eosinophils Absolute: 0.1 10*3/uL (ref 0.0–0.5)
Eosinophils Relative: 1 %
HCT: 45.1 % (ref 36.0–46.0)
Hemoglobin: 14.6 g/dL (ref 12.0–15.0)
Immature Granulocytes: 1 %
Lymphocytes Relative: 7 %
Lymphs Abs: 0.5 10*3/uL — ABNORMAL LOW (ref 0.7–4.0)
MCH: 28.2 pg (ref 26.0–34.0)
MCHC: 32.4 g/dL (ref 30.0–36.0)
MCV: 87.1 fL (ref 80.0–100.0)
Monocytes Absolute: 0.4 10*3/uL (ref 0.1–1.0)
Monocytes Relative: 7 %
Neutro Abs: 5.4 10*3/uL (ref 1.7–7.7)
Neutrophils Relative %: 83 %
Platelets: 337 10*3/uL (ref 150–400)
RBC: 5.18 MIL/uL — ABNORMAL HIGH (ref 3.87–5.11)
RDW: 17 % — ABNORMAL HIGH (ref 11.5–15.5)
WBC: 6.5 10*3/uL (ref 4.0–10.5)
nRBC: 0 % (ref 0.0–0.2)

## 2021-02-25 LAB — HEPATIC FUNCTION PANEL
ALT: 121 U/L — ABNORMAL HIGH (ref 0–44)
AST: 94 U/L — ABNORMAL HIGH (ref 15–41)
Albumin: 3.4 g/dL — ABNORMAL LOW (ref 3.5–5.0)
Alkaline Phosphatase: 320 U/L — ABNORMAL HIGH (ref 38–126)
Bilirubin, Direct: 13.2 mg/dL — ABNORMAL HIGH (ref 0.0–0.2)
Indirect Bilirubin: 5.2 mg/dL — ABNORMAL HIGH (ref 0.3–0.9)
Total Bilirubin: 18.4 mg/dL (ref 0.3–1.2)
Total Protein: 6.9 g/dL (ref 6.5–8.1)

## 2021-02-25 LAB — RESP PANEL BY RT-PCR (FLU A&B, COVID) ARPGX2
Influenza A by PCR: NEGATIVE
Influenza B by PCR: NEGATIVE
SARS Coronavirus 2 by RT PCR: NEGATIVE

## 2021-02-25 LAB — BASIC METABOLIC PANEL
Anion gap: 8 (ref 5–15)
BUN: 21 mg/dL (ref 8–23)
CO2: 24 mmol/L (ref 22–32)
Calcium: 9.5 mg/dL (ref 8.9–10.3)
Chloride: 104 mmol/L (ref 98–111)
Creatinine, Ser: 0.6 mg/dL (ref 0.44–1.00)
GFR, Estimated: >60 mL/min (ref >60)
Glucose, Bld: 112 mg/dL — ABNORMAL HIGH (ref 70–99)
Potassium: 3.5 mmol/L (ref 3.5–5.1)
Sodium: 136 mmol/L (ref 135–145)

## 2021-02-25 MED ORDER — HYDROXYZINE HCL 10 MG PO TABS
10.0000 mg | ORAL_TABLET | Freq: Three times a day (TID) | ORAL | Status: DC | PRN
  Filled 2021-02-25: qty 1

## 2021-02-25 MED ORDER — HYDRALAZINE HCL 20 MG/ML IJ SOLN
10.0000 mg | INTRAMUSCULAR | Status: DC | PRN
  Administered 2021-02-26 – 2021-02-28 (×2): 10 mg via INTRAVENOUS
  Filled 2021-02-25 (×2): qty 1

## 2021-02-25 MED ORDER — CHOLESTYRAMINE 4 G PO PACK
4.0000 g | PACK | Freq: Two times a day (BID) | ORAL | Status: DC | PRN
  Filled 2021-02-25: qty 1

## 2021-02-25 NOTE — ED Triage Notes (Signed)
Patient reports that she has  had dark urine last week, itching and jaundice 3-4 days ago. Patient had an MRI 2 days ago. Patient was instructed to go tot he ED for bile duct obstruction

## 2021-02-25 NOTE — H&P (Signed)
History and Physical    Ashley Peters PPH:[id number] DOB: 07-17-1928 DOA: 02/25/2021  PCP: Dettinger, [name], MD  Patient coming from: Home  Chief Complaint: Jaundice, itching  HPI: Ashley Peters is a 87 y.o. female with medical history significant of colon cancer followed by Oncology, HTN, HLD who presents from GI with worsening jaundice and new bili of 18 with outpt radiographic evidence of cholangiocarcinoma . Chart reviewed. Pt was admitted in 7/22 for epigastric pain, found to have cholelithiasis on ERCP. Pt improved after stone was removed. Pt had since followed up with GI for elevated LFT's and abnormal MRCP revealing a large stone. Pt had a positive PET scan. F/u MRCP on 12/17 later confirmed new intrahepatic cholangiocarcinoma.  Pt was referred to ED for admission. On further questioning, pt reports feeling itchy. Denies abd pain currently.  ED Course: In the ED, pt was found to be flu and covid neg. Alk phos noted to be 320 with total bili of 18. Hospitalist was consulted for consideration for admission.  Review of Systems:  Review of Systems  Constitutional:  Positive for chills. Negative for fever.  HENT:  Negative for congestion, ear pain and tinnitus.   Eyes:  Negative for double vision and photophobia.  Respiratory:  Negative for hemoptysis and sputum production.   Cardiovascular:  Negative for chest pain.  Gastrointestinal:  Negative for constipation, nausea and vomiting.  Genitourinary:  Negative for frequency, hematuria and urgency.  Musculoskeletal:  Negative for back pain, joint pain and neck pain.  Skin:        Jaundice  Neurological:  Negative for tremors, seizures, loss of consciousness and weakness.  Psychiatric/Behavioral:  Negative for hallucinations, memory loss and substance abuse.    Past Medical History:  Diagnosis Date   Anemia    Blood transfusion without reported diagnosis    Cancer ([place])    Colon cancer ([place]) dx'd 10/2016   Cystoid macular edema  of left eye 07/05/2019   Diverticulosis    Family history of breast cancer    Family history of colon cancer    Family history of uterine cancer    GERD (gastroesophageal reflux disease)    History of kidney stones    Hyperlipidemia    Hypertension    Macular degeneration    Osteopenia     Past Surgical History:  Procedure Laterality Date   ABDOMINAL HYSTERECTOMY     ablation tr Great saphenous vein  10/17/2009   BREAST EXCISIONAL BIOPSY Bilateral 1971   brest biopsy  1971   CHOLECYSTECTOMY     EPIGASTRIC HERNIA REPAIR N/A 10/14/2016   Procedure: REPAIR OF EPIGASTRIC VENTRAL HERNIA;  Surgeon: Donnie Mesa, MD;  Location: [facility];  Service: General;  Laterality: N/A;   ERCP N/A 09/21/2020   Procedure: ENDOSCOPIC RETROGRADE CHOLANGIOPANCREATOGRAPHY (ERCP);  Surgeon: Gatha Mayer, MD;  Location: [facility];  Service: Endoscopy;  Laterality: N/A;   lt. distal ureteral stone extraction  09/14/1991   PARTIAL COLECTOMY Right 10/14/2016   Procedure: OPEN RIGHT HEMICOLECTOMY;  Surgeon: Donnie Mesa, MD;  Location: [facility];  Service: General;  Laterality: Right;   REMOVAL OF STONES  09/21/2020   Procedure: REMOVAL OF STONES;  Surgeon: Gatha Mayer, MD;  Location: [facility];  Service: Endoscopy;;   SPHINCTEROTOMY  09/21/2020   Procedure: [name] Mayans;  Surgeon: Gatha Mayer, MD;  Location: [facility];  Service: Endoscopy;;   STONE EXTRACTION WITH BASKET  09/21/2020   Procedure: STONE EXTRACTION WITH BASKET;  Surgeon: Silvano Rusk  E, MD;  Location: [facility];  Service: Endoscopy;;     reports that she has never smoked. She has never used smokeless tobacco. She reports that she does not drink alcohol and does not use drugs.  Allergies  Allergen Reactions   Benicar [Olmesartan Medoxomil] Other (See Comments)    Elevated Potassium   Amoxicillin Rash    Has patient had a PCN reaction causing immediate rash, facial/tongue/throat swelling, SOB or lightheadedness with hypotension:  Yes Has patient had a PCN reaction causing severe rash involving mucus membranes or skin necrosis: No Has patient had a PCN reaction that required hospitalization: No Has patient had a PCN reaction occurring within the last 10 years: No If all of the above answers are "NO", then may proceed with Cephalosporin use.    Family History  Problem Relation Age of Onset   Stroke Father 37   Breast cancer Sister        dx 41's, died at 30   Seizures Son    Diabetes Brother    Kidney disease Brother    Heart disease Brother    Epilepsy Son    Colon cancer Other 54   Uterine cancer Other 81   Hodgkin's lymphoma Other    Breast cancer Other     Prior to Admission medications   Medication Sig Start Date End Date Taking? Authorizing Provider  acetaminophen (TYLENOL) 500 MG tablet Take 2 tablets (1,000 mg total) by mouth every 6 (six) hours as needed for moderate pain. Patient taking differently: Take 500-1,000 mg by mouth every 6 (six) hours as needed for moderate pain. 10/17/16  Yes Donnie Mesa, MD  alendronate (FOSAMAX) 70 MG tablet Take with a full glass of water on an empty stomach. Patient taking differently: Take 70 mg by mouth every Monday. Take with a full glass of water on an empty stomach. 02/22/20  Yes Dettinger, [name], MD  Cyanocobalamin 1000 MCG/ML KIT Inject 1,000 mcg as directed every 30 (thirty) days.   Yes [provider]  Multiple Vitamins-Minerals (PRESERVISION AREDS 2 PO) Take 1 capsule by mouth 2 (two) times daily.   Yes [provider]    Physical Exam: Vitals:   02/25/21 1253 02/25/21 1313 02/25/21 1706  BP: (!) 162/78  (!) 184/81  Pulse: 85  75  Resp: 17  16  Temp: (!) 97.3 F (36.3 C)    TempSrc: Oral    SpO2: 97%  98%  Weight:  51.5 kg   Height:  5' (1.524 m)     Constitutional: NAD, calm, comfortable Vitals:   02/25/21 1253 02/25/21 1313 02/25/21 1706  BP: (!) 162/78  (!) 184/81  Pulse: 85  75  Resp: 17  16  Temp: (!) 97.3 F  (36.3 C)    TempSrc: Oral    SpO2: 97%  98%  Weight:  51.5 kg   Height:  5' (1.524 m)    Eyes: PERRL, lids and conjunctivae normal ENMT: Mucous membranes are moist. Posterior pharynx clear of any exudate or lesions.Normal dentition.  Neck: normal, supple, no masses, no thyromegaly Respiratory: clear to auscultation bilaterally, no wheezing, no crackles. Normal respiratory effort. No accessory muscle use.  Cardiovascular: Regular rate and rhythm, s1, s2 Abdomen: no tenderness, no masses palpated. No hepatosplenomegaly. Bowel sounds positive.  Musculoskeletal: no clubbing / cyanosis. No joint deformity upper and lower extremities. Good ROM, no contractures. Normal muscle tone.  Skin: no rashes, jaundiced. No induration Neurologic: CN 2-12 grossly intact. Sensation intact, Strength 5/5 in  all 4.  Psychiatric: Normal judgment and insight. Alert and oriented x 3. Normal mood.    Labs on Admission: I have personally reviewed following labs and imaging studies  CBC: Recent Labs  Lab 02/25/21 1424  WBC 6.5  NEUTROABS 5.4  HGB 14.6  HCT 45.1  MCV 87.1  PLT 902   Basic Metabolic Panel: Recent Labs  Lab 02/19/21 1553 02/25/21 1424  NA 139 136  K 4.2 3.5  CL 102 104  CO2 25 24  GLUCOSE 121* 112*  BUN 16 21  CREATININE 0.89 0.60  CALCIUM 10.1 9.5   GFR: Estimated Creatinine Clearance: 33.6 mL/min (by C-G formula based on SCr of 0.6 mg/dL). Liver Function Tests: Recent Labs  Lab 02/19/21 0744 02/25/21 1424  AST 165* 94*  ALT 177* 121*  ALKPHOS 400* 320*  BILITOT 10.1* 18.4*  PROT 7.0 6.9  ALBUMIN 3.9 3.4*   No results for input(s): LIPASE, AMYLASE in the last 168 hours. No results for input(s): AMMONIA in the last 168 hours. Coagulation Profile: No results for input(s): INR, PROTIME in the last 168 hours. Cardiac Enzymes: No results for input(s): CKTOTAL, CKMB, CKMBINDEX, TROPONINI in the last 168 hours. BNP (last 3 results) No results for input(s): PROBNP in the  last 8760 hours. HbA1C: No results for input(s): HGBA1C in the last 72 hours. CBG: No results for input(s): GLUCAP in the last 168 hours. Lipid Profile: No results for input(s): CHOL, HDL, LDLCALC, TRIG, CHOLHDL, LDLDIRECT in the last 72 hours. Thyroid Function Tests: No results for input(s): TSH, T4TOTAL, FREET4, T3FREE, THYROIDAB in the last 72 hours. Anemia Panel: No results for input(s): VITAMINB12, FOLATE, FERRITIN, TIBC, IRON, RETICCTPCT in the last 72 hours. Urine analysis:    Component Value Date/Time   BILIRUBINUR neg 01/03/2013 1108   PROTEINUR 100 01/03/2013 1108   UROBILINOGEN negative 01/03/2013 1108   NITRITE neg 01/03/2013 1108   LEUKOCYTESUR moderate (2+) 01/03/2013 1108   Sepsis Labs: !!!!!!!!!!!!!!!!!!!!!!!!!!!!!!!!!!!!!!!!!!!! '@LABRCNTIP' (procalcitonin:4,lacticidven:4) ) Recent Results (from the past 240 hour(s))  Resp Panel by RT-PCR (Flu A&B, Covid) Nasopharyngeal Swab     Status: None   Collection Time: 02/25/21  2:25 PM   Specimen: Nasopharyngeal Swab; Nasopharyngeal(NP) swabs in vial transport medium  Result Value Ref Range Status   SARS Coronavirus 2 by RT PCR NEGATIVE NEGATIVE Final    Comment: (NOTE) SARS-CoV-2 target nucleic acids are NOT DETECTED.  The SARS-CoV-2 RNA is generally detectable in upper respiratory specimens during the acute phase of infection. The lowest concentration of SARS-CoV-2 viral copies this assay can detect is 138 copies/mL. A negative result does not preclude SARS-Cov-2 infection and should not be used as the sole basis for treatment or other patient management decisions. A negative result may occur with  improper specimen collection/handling, submission of specimen other than nasopharyngeal swab, presence of viral mutation(s) within the areas targeted by this assay, and inadequate number of viral copies(<138 copies/mL). A negative result must be combined with clinical observations, patient history, and  epidemiological information. The expected result is Negative.  Fact Sheet for Patients:  EntrepreneurPulse.com.au  Fact Sheet for Healthcare Providers:  IncredibleEmployment.be  This test is no t yet approved or cleared by the [place] FDA and  has been authorized for detection and/or diagnosis of SARS-CoV-2 by FDA under an Emergency Use Authorization (EUA). This EUA will remain  in effect (meaning this test can be used) for the duration of the COVID-19 declaration under Section 564(b)(1) of the Act, 21 U.S.C.section 360bbb-3(b)(1), unless the authorization  is terminated  or revoked sooner.       Influenza A by PCR NEGATIVE NEGATIVE Final   Influenza B by PCR NEGATIVE NEGATIVE Final    Comment: (NOTE) The Xpert Xpress SARS-CoV-2/FLU/RSV plus assay is intended as an aid in the diagnosis of influenza from Nasopharyngeal swab specimens and should not be used as a sole basis for treatment. Nasal washings and aspirates are unacceptable for Xpert Xpress SARS-CoV-2/FLU/RSV testing.  Fact Sheet for Patients: EntrepreneurPulse.com.au  Fact Sheet for Healthcare Providers: IncredibleEmployment.be  This test is not yet approved or cleared by the [place] FDA and has been authorized for detection and/or diagnosis of SARS-CoV-2 by FDA under an Emergency Use Authorization (EUA). This EUA will remain in effect (meaning this test can be used) for the duration of the COVID-19 declaration under Section 564(b)(1) of the Act, 21 U.S.C. section 360bbb-3(b)(1), unless the authorization is terminated or revoked.  Performed at [facility], [place] [address] [place], [place] [address]      Radiological Exams on Admission: No results found. Recent MRCP reviewed from 12/17:  Findings of enlarging malignant lesion in the central aspect of the liver which has imaging characteristics most suggestive  of intrahepatic cholangiocarcinoma which is now invasive of the bile ducts extending distally into the common bile duct causing severe biliary obstruction. 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 Pt with findings suggestive of metastatic disease with presenting bili of 18 and alk phos of 320 Pt referred by GI, recommendations noted for clear diet now, NPO after midnight and plan for ERCP on 12/20 Will follow up on CMP, CBC, INR in AM Patient is known to Dr. Burr Medico. Will make her oncologist aware of this admission HLD Not on statin prior to admit Given elevated LFT's hold off on statin HTN BP currently suboptimally controlled Not on bp meds prior to admit Will continue on PRN hydralazine Hx colon cancer Pt is followed by Dr. Burr Medico Per above, will make oncologist aware of this visit  DVT prophylaxis: SCD's  Code Status: Full, confirmed with patient and family in room Family Communication: Pt's daughter at bedside  Disposition Plan:   Consults called: GI Admission status: Inpatient given marked LFT's requiring inpt procedural workup   Marylu Lund MD [facility] Pager On Amion  If 7PM-7AM, please contact night-coverage  02/25/2021, 5:45 PM

## 2021-02-25 NOTE — ED Provider Notes (Addendum)
Emergency Medicine Provider Triage Evaluation Note  Ashley Peters , a 87 y.o. female  was evaluated in triage.  Pt complains of jaundice and itching.  Patient has had an outpatient MRCP showing mass and biliary duct obstruction.  She presents to the emergency department for admission to the hospital.  GI plans to perform ERCP tomorrow for biliary stent placement.  Review of Systems  Positive: Jaundice Negative: Vomiting  Physical Exam  BP (!) 162/78 (BP Location: Left Arm)    Pulse 85    Temp (!) 97.3 F (36.3 C) (Oral)    Resp 17    Ht 5' (1.524 m)    Wt 51.5 kg    SpO2 97%    BMI 22.19 kg/m  Gen:   Awake, no distress   Resp:  Normal effort  MSK:   Moves extremities without difficulty  Other:  Jaundice noted  Medical Decision Making  Medically screening exam initiated at 2:14 PM.  Appropriate orders placed.  Ashley Peters was informed that the remainder of the evaluation will be completed by another provider, this initial triage assessment does not replace that evaluation, and the importance of remaining in the ED until their evaluation is complete.      Carlisle Cater, PA-C 02/25/21 1415    Lacretia Leigh, MD 02/27/21 [phone]

## 2021-02-25 NOTE — ED Provider Notes (Signed)
[facility] DEPT Provider Note   CSN: [id number] Arrival date & time: 02/25/21  1235     History Chief Complaint  Patient presents with   Jaundice    Ashley Peters is a 87 y.o. female.  Patient with history of colon cancer 5 years ago status post right hemicolectomy, choledocholithiasis status post ERCP, now with elevating LFTs and jaundice, followed by GI --presents the emergency department for jaundice and biliary duct obstruction.  Patient was scheduled for an MRCP which was performed on 02/23/2021.  This demonstrated an infiltrative mass of the liver which is suspected to be causing biliary obstruction.  She reports skin changes over the past week with itching.  She was referred to the emergency department by her gastroenterology group for admission.  Plan is for ERCP and biliary stenting tomorrow.  Patient followed by Dr. Carlean Purl of GI and Dr. Krista Blue of heme/onc.      Past Medical History:  Diagnosis Date   Anemia    Blood transfusion without reported diagnosis    Cancer ([place])    Colon cancer ([place]) dx'd 10/2016   Cystoid macular edema of left eye 07/05/2019   Diverticulosis    Family history of breast cancer    Family history of colon cancer    Family history of uterine cancer    GERD (gastroesophageal reflux disease)    History of kidney stones    Hyperlipidemia    Hypertension    Macular degeneration    Osteopenia     Patient Active Problem List   Diagnosis Date Noted   Calculus of bile duct with cholangitis and obstruction    LFT elevation 09/20/2020   Abnormal LFTs 09/20/2020   Epigastric pain 09/20/2020   Biliary obstruction    Jaundice    Liver mass    Hepatitis 09/19/2020   Advanced nonexudative age-related macular degeneration of left eye with subfoveal involvement 04/17/2020   Age-related osteoporosis without current pathological fracture 02/22/2020   Vitamin D deficiency 02/22/2020   Advanced nonexudative age-related  macular degeneration of right eye with subfoveal involvement 08/01/2019   Exudative age-related macular degeneration of left eye with inactive choroidal neovascularization ([place]) 07/05/2019   Exudative age-related macular degeneration of right eye with inactive choroidal neovascularization ([place]) 07/05/2019   Vitreomacular adhesion of left eye 07/05/2019   B12 deficiency 03/24/2017   Genetic testing 12/23/2016   History of colon cancer    Family history of uterine cancer    Family history of breast cancer    Iron deficiency anemia due to chronic blood loss 11/07/2016   Cancer of right colon ([place]) 10/14/2016   Hyperlipidemia 11/01/2014   HTN (hypertension) 07/15/2010   GERD (gastroesophageal reflux disease) 07/15/2010   Diverticulosis 07/15/2010    Past Surgical History:  Procedure Laterality Date   ABDOMINAL HYSTERECTOMY     ablation tr Great saphenous vein  10/17/2009   BREAST EXCISIONAL BIOPSY Bilateral 1971   brest biopsy  1971   CHOLECYSTECTOMY     EPIGASTRIC HERNIA REPAIR N/A 10/14/2016   Procedure: REPAIR OF EPIGASTRIC VENTRAL HERNIA;  Surgeon: Donnie Mesa, MD;  Location: [facility];  Service: General;  Laterality: N/A;   ERCP N/A 09/21/2020   Procedure: ENDOSCOPIC RETROGRADE CHOLANGIOPANCREATOGRAPHY (ERCP);  Surgeon: Gatha Mayer, MD;  Location: [facility];  Service: Endoscopy;  Laterality: N/A;   lt. distal ureteral stone extraction  09/14/1991   PARTIAL COLECTOMY Right 10/14/2016   Procedure: OPEN RIGHT HEMICOLECTOMY;  Surgeon: Donnie Mesa, MD;  Location: [facility];  Service: General;  Laterality: Right;   REMOVAL OF STONES  09/21/2020   Procedure: REMOVAL OF STONES;  Surgeon: Gatha Mayer, MD;  Location: [facility];  Service: Endoscopy;;   SPHINCTEROTOMY  09/21/2020   Procedure: [name] Mayans;  Surgeon: Gatha Mayer, MD;  Location: [facility];  Service: Endoscopy;;   STONE EXTRACTION WITH BASKET  09/21/2020   Procedure: STONE EXTRACTION WITH BASKET;  Surgeon: Gatha Mayer, MD;  Location: [facility];  Service: Endoscopy;;     OB History   No obstetric history on file.     Family History  Problem Relation Age of Onset   Stroke Father 7   Breast cancer Sister        dx 10's, died at 30   Seizures Son    Diabetes Brother    Kidney disease Brother    Heart disease Brother    Epilepsy Son    Colon cancer Other 39   Uterine cancer Other 44   Hodgkin's lymphoma Other    Breast cancer Other     Social History   Tobacco Use   Smoking status: Never   Smokeless tobacco: Never  Vaping Use   Vaping Use: Never used  Substance Use Topics   Alcohol use: No   Drug use: No    Home Medications Prior to Admission medications   Medication Sig Start Date End Date Taking? Authorizing Provider  acetaminophen (TYLENOL) 500 MG tablet Take 2 tablets (1,000 mg total) by mouth every 6 (six) hours as needed for moderate pain. 10/17/16   Donnie Mesa, MD  alendronate (FOSAMAX) 70 MG tablet Take with a full glass of water on an empty stomach. Patient taking differently: Take 70 mg by mouth every Monday. Take with a full glass of water on an empty stomach. 02/22/20   Dettinger, [name], MD  Multiple Vitamins-Minerals (PRESERVISION AREDS 2 PO) Take 1 capsule by mouth 2 (two) times daily.    [provider]    Allergies    Benicar [olmesartan medoxomil] and Amoxicillin  Review of Systems   Review of Systems  Constitutional:  Negative for fever.  HENT:  Negative for rhinorrhea and sore throat.   Eyes:  Negative for redness.  Respiratory:  Negative for cough.   Cardiovascular:  Negative for chest pain.  Gastrointestinal:  Negative for abdominal pain, diarrhea, nausea and vomiting.  Genitourinary:  Negative for dysuria, frequency, hematuria and urgency.       + Dark urine  Musculoskeletal:  Negative for myalgias.  Skin:  Positive for color change. Negative for rash.  Neurological:  Negative for headaches.   Physical Exam Updated Vital  Signs BP (!) 162/78 (BP Location: Left Arm)    Pulse 85    Temp (!) 97.3 F (36.3 C) (Oral)    Resp 17    Ht 5' (1.524 m)    Wt 51.5 kg    SpO2 97%    BMI 22.19 kg/m   Physical Exam Vitals and nursing note reviewed.  Constitutional:      General: She is not in acute distress.    Appearance: She is well-developed.  HENT:     Head: Normocephalic and atraumatic.     Right Ear: External ear normal.     Left Ear: External ear normal.     Nose: Nose normal.  Eyes:     General: Scleral icterus present.     Conjunctiva/sclera: Conjunctivae normal.     Comments: Scleral icterus noted  Cardiovascular:  Rate and Rhythm: Normal rate and regular rhythm.     Heart sounds: No murmur heard. Pulmonary:     Effort: No respiratory distress.     Breath sounds: No wheezing, rhonchi or rales.  Abdominal:     Palpations: Abdomen is soft.     Tenderness: There is no abdominal tenderness. There is no guarding or rebound.     Comments: No signs of peritonitis  Musculoskeletal:     Cervical back: Normal range of motion and neck supple.     Right lower leg: No edema.     Left lower leg: No edema.  Skin:    General: Skin is warm and dry.     Coloration: Skin is jaundiced.     Findings: No rash.     Comments: Generalized jaundice  Neurological:     General: No focal deficit present.     Mental Status: She is alert. Mental status is at baseline.     Motor: No weakness.  Psychiatric:        Mood and Affect: Mood normal.    ED Results / Procedures / Treatments   Labs (all labs ordered are listed, but only abnormal results are displayed) Labs Reviewed  CBC WITH DIFFERENTIAL/PLATELET - Abnormal; Notable for the following components:      Result Value   RBC 5.18 (*)    RDW 17.0 (*)    Lymphs Abs 0.5 (*)    All other components within normal limits  BASIC METABOLIC PANEL - Abnormal; Notable for the following components:   Glucose, Bld 112 (*)    All other components within normal limits   HEPATIC FUNCTION PANEL - Abnormal; Notable for the following components:   Albumin 3.4 (*)    AST 94 (*)    ALT 121 (*)    Alkaline Phosphatase 320 (*)    Total Bilirubin 18.4 (*)    Bilirubin, Direct 13.2 (*)    Indirect Bilirubin 5.2 (*)    All other components within normal limits  RESP PANEL BY RT-PCR (FLU A&B, COVID) ARPGX2  CBC  HEPATIC FUNCTION PANEL  BASIC METABOLIC PANEL  PROTIME-INR    EKG None  Radiology No results found.  Procedures Procedures   Medications Ordered in ED Medications - No data to display  ED Course  I have reviewed the triage vital signs and the nursing notes.  Pertinent labs & imaging results that were available during my care of the patient were reviewed by me and considered in my medical decision making (see chart for details).  Patient seen and examined. Plan discussed with patient.   Labs: CBC, BMP, hepatic function panel, UA, COVID and flu testing  Imaging: None none  Medications/Fluids: None  Vital signs reviewed and are as follows: BP (!) 162/78 (BP Location: Left Arm)    Pulse 85    Temp (!) 97.3 F (36.3 C) (Oral)    Resp 17    Ht 5' (1.524 m)    Wt 51.5 kg    SpO2 97%    BMI 22.19 kg/m   Initial impression: Biliary obstruction  Plan to admit after patient is roomed.  Patient and daughter at bedside agree with plan.  I was contacted earlier by GI, recommend: NPO after midnight and hold any anticoagulation/Lovenox for ERCP tomorrow. GI to see her in early tomorrow AM.   5:05 PM patient now in room.  Will contact hospitalist for admission.  5:12 PM Spoke with Dr. Wyline Copas with [facility] who will see.  MDM Rules/Calculators/A&P                         Admit.      Final Clinical Impression(s) / ED Diagnoses Final diagnoses:  Hyperbilirubinemia  Liver mass  Common biliary duct obstruction    Rx / DC Orders ED Discharge Orders     None        Carlisle Cater, PA-C 02/25/21 1713    Charlesetta Shanks,  MD 03/01/21 2105

## 2021-02-25 NOTE — Consult Note (Incomplete)
Referring Provider: Gatha Mayer, MD Primary Care Physician:  Dettinger, [name], MD Primary Gastroenterologist:  Dr. Silvano Rusk   Reason for Consultation:  Hepatic mass, possible cholangiocarcinoma   HPI: Ashley Peters is a 87 y.o. female with past medical history of hypertension, hyperlipidemia, macular degeneration, kidney stones, colon polyps, colon cancer s/p right hemicolectomy 2018, GERD and ascending cholangitis secondary to choledocholithiasis s/p ERCP with sphincterotomy and stone extraction by Dr. Carlean Purl 09/21/2020.   She was seen in our GI clinic by Dr. Carlean Purl 01/23/2021 due to having elevated LFTs and an abnormal abdominal MRI w/wo contrast 12/04/2020 showed intra and extrahepatic biliary ductal dilatation with a large calculus in the intrahepatic common bile duct measuring 2.6 cm.  A anterior right liver lesion measuring 3.5 x 2.6 and  a central liver lesion 5.0 x 3.1 cm starting for metastatic disease from prior colon cancer versus cholangiocarcinoma. A PET scan 01/01/2021 showed hypermetabolic activity to 2 areas within the liver concerning for metastatic disease.   Dr. Carlean Purl reviewed her case with Dr. Rush Landmark with plans to perform an ERCP as an outpatient. However, she developed dark-colored urine X 2 days and labs were done which showed a total bilirubin level 6.7 (t. Bili 0.4 on 01/23/2021).  Direct bili 4.6.  Alk phos 414.  AST 105.  ALT 143. Repeat labs were done 02/15/2021 which showed a rising total bilirubin level of 10.1.  Alk phos 400.  AST 165.  ALT 177. She subsequently underwent an abdominal MRI/MRCP w/wo contrast on 02/23/2021 which identified an enlarging infiltrating mass to the central liver measuring 7.8 x 4.3 cm with a large portion of this lesion which extended into the intrahepatic biliary tree extending distally into the common hepatic duct and common bile duct causing severe biliary obstruction, findings concerning for cholangiocarcinoma.  She was  advised by Dr. Carlean Purl earlier today to proceed to [facility] ED with the intent for admission by the hospitalist and ERCP possibly tomorrow with Dr. Rush Landmark. .  Hepatic Function Latest Ref Rng & Units 02/19/2021 02/15/2021 01/23/2021  Total Protein 6.0 - 8.3 g/dL 7.0 6.8 6.9  Albumin 3.5 - 5.2 g/dL 3.9 3.7 4.1  AST 0 - 37 U/L 165(H) 105(H) 14  ALT 0 - 35 U/L 177(H) 143(H) 9  Alk Phosphatase 39 - 117 U/L 400(H) 414(H) 58  Total Bilirubin 0.2 - 1.2 mg/dL 10.1(H) 6.7(H) 0.4  Bilirubin, Direct 0.0 - 0.3 mg/dL 6.4(H) 4.6(H) 0.0   ABDOMINAL IMAGING:  Abdominal MRI/MRCP w/wo contrast 02/23/2021: 1. Enlarging malignant lesion in the central aspect of the liver which has imaging characteristics most suggestive of intrahepatic cholangiocarcinoma, which is now invasive of the bile ducts extending distally into the common bile duct causing severe biliary obstruction as demonstrated by severe intrahepatic biliary dilatation. There are probable malignant lymph nodes in hepatoduodenal and portacaval nodal stations, as above. 2. Additional incidental findings, as above.  PET scan 01/01/2021: 1. 9 mm ground-glass nodule in the posterior right lower lobe is hypermetabolic. This nodule is new since 09/19/2020 while potentially infectious/inflammatory, primary bronchogenic neoplasm or metastatic disease could have this appearance. Close follow-up recommended. 2. 2 areas of low level hypermetabolism in the liver without obvious underlying correlate on CT imaging. Metastatic disease remains a concern. 3.  Aortic Atherosclerois   Abdominal MRI w/wo contrast 12/04/2020: 1. Interval extraction of multiple common bile duct calculi with persistent intra and extrahepatic biliary ductal dilatation, similar in caliber to prior examination.   2. A large calculus remains  in the intrahepatic common bile duct measuring at least 2.6 cm.   3. No significant change in heterogeneously enhancing,  somewhat ill-defined lesions of the anterior right lobe of the liver, hepatic segment V, or within the central liver. As on prior examination, these remain consistent with either metastases from patient's known colon malignancy or alternately cholangiocarcinoma  PAST GI PROCEDURES:  ERCP 09/21/2020: - Choledocholithiasis with an obstruction was found.Purulence indicating cholangits seen. Complete removal was accomplished by biliary sphincterotomy and basket extraction. - Duodenal diverticulum. Multiple  Colonoscopy 12/23/2017: - Decreased sphincter tone found on digital rectal exam. - Two 3 mm polyps in the rectum and in the descending colon, removed with a cold snare. Resected and retrieved. - One 8 mm polyp in the rectum, removed with a hot snare. Resected and retrieved. - Diverticulosis in the entire examined colon. - The examination was otherwise normal on direct and retroflexion views. 1. Surgical [P], descending, rectum, polyp (2) - TUBULAR ADENOMA (ONE). - HYPERPLASTIC POLYP (TWO). - NO HIGH GRADE DYSPLASIA OR MALIGNANCY. 2. Surgical [P], rectum, polyp - TUBULAR ADENOMA (ONE). - NO HIGH GRADE DYSPLASIA OR MALIGNANCY.  Colonoscopy 12/29/2001: Diverticulosis descending and sigmoid colon No polyp  Past Medical History:  Diagnosis Date   Anemia    Blood transfusion without reported diagnosis    Cancer ([place])    Colon cancer ([place]) dx'd 10/2016   Cystoid macular edema of left eye 07/05/2019   Diverticulosis    Family history of breast cancer    Family history of colon cancer    Family history of uterine cancer    GERD (gastroesophageal reflux disease)    History of kidney stones    Hyperlipidemia    Hypertension    Macular degeneration    Osteopenia    Past Surgical History:  Procedure Laterality Date   ABDOMINAL HYSTERECTOMY     ablation tr Great saphenous vein  10/17/2009   BREAST EXCISIONAL BIOPSY Bilateral 1971   brest biopsy  1971   CHOLECYSTECTOMY      EPIGASTRIC HERNIA REPAIR N/A 10/14/2016   Procedure: REPAIR OF EPIGASTRIC VENTRAL HERNIA;  Surgeon: Donnie Mesa, MD;  Location: [facility];  Service: General;  Laterality: N/A;   ERCP N/A 09/21/2020   Procedure: ENDOSCOPIC RETROGRADE CHOLANGIOPANCREATOGRAPHY (ERCP);  Surgeon: Gatha Mayer, MD;  Location: [facility];  Service: Endoscopy;  Laterality: N/A;   lt. distal ureteral stone extraction  09/14/1991   PARTIAL COLECTOMY Right 10/14/2016   Procedure: OPEN RIGHT HEMICOLECTOMY;  Surgeon: Donnie Mesa, MD;  Location: [facility];  Service: General;  Laterality: Right;   REMOVAL OF STONES  09/21/2020   Procedure: REMOVAL OF STONES;  Surgeon: Gatha Mayer, MD;  Location: [facility];  Service: Endoscopy;;   SPHINCTEROTOMY  09/21/2020   Procedure: [name] Mayans;  Surgeon: Gatha Mayer, MD;  Location: [facility];  Service: Endoscopy;;   STONE EXTRACTION WITH BASKET  09/21/2020   Procedure: STONE EXTRACTION WITH BASKET;  Surgeon: Gatha Mayer, MD;  Location: [facility];  Service: Endoscopy;;    Prior to Admission medications   Medication Sig Start Date End Date Taking? Authorizing Provider  acetaminophen (TYLENOL) 500 MG tablet Take 2 tablets (1,000 mg total) by mouth every 6 (six) hours as needed for moderate pain. 10/17/16   Donnie Mesa, MD  alendronate (FOSAMAX) 70 MG tablet Take with a full glass of water on an empty stomach. Patient taking differently: Take 70 mg by mouth every Monday. Take with a full glass of water on an empty  stomach. 02/22/20   Dettinger, [name], MD  Multiple Vitamins-Minerals (PRESERVISION AREDS 2 PO) Take 1 capsule by mouth 2 (two) times daily.    [provider]    Current Facility-Administered Medications  Medication Dose Route Frequency Provider Last Rate Last Admin   cyanocobalamin ((VITAMIN B-12)) injection 1,000 mcg  1,000 mcg Intramuscular Q30 days Dettinger, [name], MD   1,000 mcg at 02/14/21 7902   Current Outpatient Medications   Medication Sig Dispense Refill   acetaminophen (TYLENOL) 500 MG tablet Take 2 tablets (1,000 mg total) by mouth every 6 (six) hours as needed for moderate pain. 30 tablet 0   alendronate (FOSAMAX) 70 MG tablet Take with a full glass of water on an empty stomach. (Patient taking differently: Take 70 mg by mouth every Monday. Take with a full glass of water on an empty stomach.) 12 tablet 3   Multiple Vitamins-Minerals (PRESERVISION AREDS 2 PO) Take 1 capsule by mouth 2 (two) times daily.      Allergies as of 02/23/2021 - Review Complete 01/23/2021  Allergen Reaction Noted   Benicar [olmesartan medoxomil] Other (See Comments) 07/15/2010   Amoxicillin Rash 07/15/2010    Family History  Problem Relation Age of Onset   Stroke Father 53   Breast cancer Sister        dx 9's, died at 8   Seizures Son    Diabetes Brother    Kidney disease Brother    Heart disease Brother    Epilepsy Son    Colon cancer Other 43   Uterine cancer Other 29   Hodgkin's lymphoma Other    Breast cancer Other     Social History   Socioeconomic History   Marital status: Widowed    Spouse name: Not on file   Number of children: 3   Years of education: 65   Highest education level: Some college, no degree  Occupational History   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  Tobacco Use   Smoking status: Never   Smokeless tobacco: Never  Vaping Use   Vaping Use: Never used  Substance and Sexual Activity   Alcohol use: No   Drug use: No   Sexual activity: Not Currently    Birth control/protection: None  Other Topics Concern   Not on file  Social History Narrative   Lives alone - daughter and son live 2 miles away   Social Determinants of Health   Financial Resource Strain: Low Risk    Difficulty of Paying Living Expenses: Not hard at all  Food Insecurity: No Food Insecurity   Worried About Charity fundraiser in the Last Year: Never true   Arboriculturist in the Last Year: Never true   Transportation Needs: No Transportation Needs   Lack of Transportation (Medical): No   Lack of Transportation (Non-Medical): No  Physical Activity: Insufficiently Active   Days of Exercise per Week: 7 days   Minutes of Exercise per Session: 20 min  Stress: No Stress Concern Present   Feeling of Stress : Not at all  Social Connections: Moderately Integrated   Frequency of Communication with Friends and Family: More than three times a week   Frequency of Social Gatherings with Friends and Family: More than three times a week   Attends Religious Services: More than 4 times per year   Active Member of [employer] or Organizations: Yes   Attends Archivist Meetings: More than 4 times per year   Marital  Status: Widowed  Human resources officer Violence: Not At Risk   Fear of Current or Ex-Partner: No   Emotionally Abused: No   Physically Abused: No   Sexually Abused: No    Review of Systems: Gen: Denies fever, sweats or chills. No weight loss.  CV: Denies chest pain, palpitations or edema. Resp: Denies cough, shortness of breath of hemoptysis.  GI: Denies heartburn, dysphagia, stomach or lower abdominal pain. No diarrhea or constipation. No rectal bleeding or melena.   GU : Denies urinary burning, blood in urine, increased urinary frequency or incontinence. MS: Denies joint pain, muscles aches or weakness. Derm: Denies rash, itchiness, skin lesions or unhealing ulcers. Psych: Denies depression, anxiety, memory loss, suicidal ideation or confusion. Heme: Denies easy bruising, bleeding. Neuro:  Denies headaches, dizziness or paresthesias. Endo:  Denies any problems with DM, thyroid or adrenal function.  Physical Exam: Vital signs in last 24 hours: Temp:  [97.3 F (36.3 C)] 97.3 F (36.3 C) (12/19 1253) Pulse Rate:  [85] 85 (12/19 1253) Resp:  [17] 17 (12/19 1253) BP: (162)/(78) 162/78 (12/19 1253) SpO2:  [97 %] 97 % (12/19 1253) Weight:  [51.5 kg] 51.5 kg (12/19 1313)   General:   Alert,  well-developed, well-nourished, pleasant and cooperative in NAD. Head:  Normocephalic and atraumatic. Eyes:  No scleral icterus. Conjunctiva pink. Ears:  Normal auditory acuity. Nose:  No deformity, discharge or lesions. Mouth:  Dentition intact. No ulcers or lesions.  Neck:  Supple. No lymphadenopathy or thyromegaly.  Lungs:   Heart:   Abdomen:   Rectal: Deferred. Musculoskeletal:  Symmetrical without gross deformities.  Pulses:  Normal pulses noted. Extremities:  Without clubbing or edema. Neurologic:  Alert and  oriented x4. No focal deficits.  Skin:  Intact without significant lesions or rashes. Psych:  Alert and cooperative. Normal mood and affect.  Intake/Output from previous day: No intake/output data recorded. Intake/Output this shift: No intake/output data recorded.  Lab Results: No results for input(s): WBC, HGB, HCT, PLT in the last 72 hours. BMET No results for input(s): NA, K, CL, CO2, GLUCOSE, BUN, CREATININE, CALCIUM in the last 72 hours. LFT No results for input(s): PROT, ALBUMIN, AST, ALT, ALKPHOS, BILITOT, BILIDIR, IBILI in the last 72 hours. PT/INR No results for input(s): LABPROT, INR in the last 72 hours. Hepatitis Panel No results for input(s): HEPBSAG, HCVAB, HEPAIGM, HEPBIGM in the last 72 hours.    Studies/Results: No results found.  IMPRESSION/PLAN:  21) 87 year old female with a history of ascending cholangitis secondary to choledocholithiasis s/p ERCP and sphincterotomy and stone extraction 09/2020 admitted to the hospital 02/25/2021 with elevated LFTS and T. bilirubin levels secondary to a large central liver mass which extends into the intrahepatic biliary tree, common hepatic duct and common bile duct resulting in a severe biliary obstruction per MRI/MRCP, concerning for cholangiocarcinoma. She is afebrile. WBC 6.5.  -Clear liquid diet  -NPO after midnight -She is tentatively scheduled for an ERCP with Dr. Rush Landmark on  02/26/2021 -CBC, BMP, hepatic panel and INR in am  2) History of colon cancer s/p right hemicolectomy 2018  3) GERD   Noralyn Pick  02/25/2021, 2:21 PM

## 2021-02-25 NOTE — Progress Notes (Signed)
Noted, will await arrival to ED

## 2021-02-25 NOTE — ED Notes (Signed)
ED TO INPATIENT HANDOFF REPORT  Name/Age/Gender [name] 87 y.o. female  Code Status    Code Status Orders  (From admission, onward)           Start     Ordered   02/25/21 1738  Full code  Continuous        02/25/21 1741           Code Status History     Date Active Date Inactive Code Status Order ID Comments User Context   09/20/2020 1013 09/22/2020 1620 Full Code [id number]  Rise Patience, MD Inpatient   10/14/2016 1949 10/17/2016 1601 Full Code [id number]  Donnie Mesa, MD Inpatient       Home/SNF/Other Home  Chief Complaint Hyperbilirubinemia [E80.6]  Level of Care/Admitting Diagnosis ED Disposition     ED Disposition  Admit   Condition  --   [place]: [facility] [100102]  Level of Care: Med-Surg [16]  May admit patient to [facility] or [facility] if equivalent level of care is available:: No  Covid Evaluation: Confirmed COVID Negative  Diagnosis: Hyperbilirubinemia [782956]  Admitting Physician: Donne Hazel [[id number]]  Attending Physician: Donne Hazel [[id number]]  Estimated length of stay: past midnight tomorrow  Certification:: I certify this patient will need inpatient services for at least 2 midnights          Medical History Past Medical History:  Diagnosis Date   Anemia    Blood transfusion without reported diagnosis    Cancer ([place])    Colon cancer ([place]) dx'd 10/2016   Cystoid macular edema of left eye 07/05/2019   Diverticulosis    Family history of breast cancer    Family history of colon cancer    Family history of uterine cancer    GERD (gastroesophageal reflux disease)    History of kidney stones    Hyperlipidemia    Hypertension    Macular degeneration    Osteopenia     Allergies Allergies  Allergen Reactions   Benicar [Olmesartan Medoxomil] Other (See Comments)    Elevated Potassium   Amoxicillin Rash    Has patient had a PCN reaction causing immediate rash,  facial/tongue/throat swelling, SOB or lightheadedness with hypotension: Yes Has patient had a PCN reaction causing severe rash involving mucus membranes or skin necrosis: No Has patient had a PCN reaction that required hospitalization: No Has patient had a PCN reaction occurring within the last 10 years: No If all of the above answers are "NO", then may proceed with Cephalosporin use.    IV Location/Drains/Wounds Patient Lines/Drains/Airways Status     Active Line/Drains/Airways     Name Placement date Placement time Site Days   Peripheral IV 02/25/21 20 G Anterior;Left;Proximal Forearm 02/25/21  1710  Forearm  less than 1            Labs/Imaging Results for orders placed or performed during the hospital encounter of 02/25/21 (from the past 48 hour(s))  CBC with Differential/Platelet     Status: Abnormal   Collection Time: 02/25/21  2:24 PM  Result Value Ref Range   WBC 6.5 4.0 - 10.5 K/uL   RBC 5.18 (H) 3.87 - 5.11 MIL/uL   Hemoglobin 14.6 12.0 - 15.0 g/dL   HCT 45.1 36.0 - 46.0 %   MCV 87.1 80.0 - 100.0 fL   MCH 28.2 26.0 - 34.0 pg   MCHC 32.4 30.0 - 36.0 g/dL   RDW 17.0 (H) 11.5 - 15.5 %  Platelets 337 150 - 400 K/uL   nRBC 0.0 0.0 - 0.2 %   Neutrophils Relative % 83 %   Neutro Abs 5.4 1.7 - 7.7 K/uL   Lymphocytes Relative 7 %   Lymphs Abs 0.5 (L) 0.7 - 4.0 K/uL   Monocytes Relative 7 %   Monocytes Absolute 0.4 0.1 - 1.0 K/uL   Eosinophils Relative 1 %   Eosinophils Absolute 0.1 0.0 - 0.5 K/uL   Basophils Relative 1 %   Basophils Absolute 0.1 0.0 - 0.1 K/uL   Immature Granulocytes 1 %   Abs Immature Granulocytes 0.05 0.00 - 0.07 K/uL    Comment: Performed at [facility], [place] [address] [place], [place] [address]  Basic metabolic panel     Status: Abnormal   Collection Time: 02/25/21  2:24 PM  Result Value Ref Range   Sodium 136 135 - 145 mmol/L   Potassium 3.5 3.5 - 5.1 mmol/L   Chloride 104 98 - 111 mmol/L   CO2 24 22 - 32 mmol/L    Glucose, Bld 112 (H) 70 - 99 mg/dL    Comment: Glucose reference range applies only to samples taken after fasting for at least 8 hours.   BUN 21 8 - 23 mg/dL   Creatinine, Ser 0.60 0.44 - 1.00 mg/dL   Calcium 9.5 8.9 - 10.3 mg/dL   GFR, Estimated >60 >60 mL/min    Comment: (NOTE) Calculated using the CKD-EPI Creatinine Equation (2021)    Anion gap 8 5 - 15    Comment: Performed at [facility], [place] [address], [place], [place] [address]  Hepatic function panel     Status: Abnormal   Collection Time: 02/25/21  2:24 PM  Result Value Ref Range   Total Protein 6.9 6.5 - 8.1 g/dL   Albumin 3.4 (L) 3.5 - 5.0 g/dL   AST 94 (H) 15 - 41 U/L   ALT 121 (H) 0 - 44 U/L   Alkaline Phosphatase 320 (H) 38 - 126 U/L   Total Bilirubin 18.4 (HH) 0.3 - 1.2 mg/dL    Comment: ICTERIC SPECIMEN CRITICAL RESULT CALLED TO, READ BACK BY AND VERIFIED WITH: FRANKLIN,C. RN AT 0109 02/25/21 MULLINS,T    Bilirubin, Direct 13.2 (H) 0.0 - 0.2 mg/dL    Comment: RESULTS CONFIRMED BY MANUAL DILUTION ICTERIC SPECIMEN    Indirect Bilirubin 5.2 (H) 0.3 - 0.9 mg/dL    Comment: Performed at [facility], [place] [address], [place], [place] [address]  Resp Panel by RT-PCR (Flu A&B, Covid) Nasopharyngeal Swab     Status: None   Collection Time: 02/25/21  2:25 PM   Specimen: Nasopharyngeal Swab; Nasopharyngeal(NP) swabs in vial transport medium  Result Value Ref Range   SARS Coronavirus 2 by RT PCR NEGATIVE NEGATIVE    Comment: (NOTE) SARS-CoV-2 target nucleic acids are NOT DETECTED.  The SARS-CoV-2 RNA is generally detectable in upper respiratory specimens during the acute phase of infection. The lowest concentration of SARS-CoV-2 viral copies this assay can detect is 138 copies/mL. A negative result does not preclude SARS-Cov-2 infection and should not be used as the sole basis for treatment or other patient management decisions. A negative result may occur with  improper  specimen collection/handling, submission of specimen other than nasopharyngeal swab, presence of viral mutation(s) within the areas targeted by this assay, and inadequate number of viral copies(<138 copies/mL). A negative result must be combined with clinical observations, patient history, and epidemiological information. The expected result  is Negative.  Fact Sheet for Patients:  EntrepreneurPulse.com.au  Fact Sheet for Healthcare Providers:  IncredibleEmployment.be  This test is no t yet approved or cleared by the [place] FDA and  has been authorized for detection and/or diagnosis of SARS-CoV-2 by FDA under an Emergency Use Authorization (EUA). This EUA will remain  in effect (meaning this test can be used) for the duration of the COVID-19 declaration under Section 564(b)(1) of the Act, 21 U.S.C.section 360bbb-3(b)(1), unless the authorization is terminated  or revoked sooner.       Influenza A by PCR NEGATIVE NEGATIVE   Influenza B by PCR NEGATIVE NEGATIVE    Comment: (NOTE) The Xpert Xpress SARS-CoV-2/FLU/RSV plus assay is intended as an aid in the diagnosis of influenza from Nasopharyngeal swab specimens and should not be used as a sole basis for treatment. Nasal washings and aspirates are unacceptable for Xpert Xpress SARS-CoV-2/FLU/RSV testing.  Fact Sheet for Patients: EntrepreneurPulse.com.au  Fact Sheet for Healthcare Providers: IncredibleEmployment.be  This test is not yet approved or cleared by the [place] FDA and has been authorized for detection and/or diagnosis of SARS-CoV-2 by FDA under an Emergency Use Authorization (EUA). This EUA will remain in effect (meaning this test can be used) for the duration of the COVID-19 declaration under Section 564(b)(1) of the Act, 21 U.S.C. section 360bbb-3(b)(1), unless the authorization is terminated or revoked.  Performed at [facility], [place] [address] [place], [place] [address]    No results found.  Pending Labs Unresulted Labs (From admission, onward)     Start     Ordered   02/26/21 0500  CBC  Tomorrow morning,   R        02/25/21 1633   02/26/21 0500  Protime-INR  Tomorrow morning,   R        02/25/21 1633   02/26/21 0500  Comprehensive metabolic panel  Tomorrow morning,   R        02/25/21 1741            Vitals/Pain Today's Vitals   02/25/21 1253 02/25/21 1313 02/25/21 1706 02/25/21 2034  BP: (!) 162/78  (!) 184/81 (!) 158/69  Pulse: 85  75 80  Resp: 17  16 16   Temp: (!) 97.3 F (36.3 C)   97.9 F (36.6 C)  TempSrc: Oral     SpO2: 97%  98% 99%  Weight:  113 lb 9.6 oz (51.5 kg)    Height:  5' (1.524 m)    PainSc:  0-No pain      Isolation Precautions No active isolations  Medications Medications  cholestyramine (QUESTRAN) packet 4 g (has no administration in time range)  hydrOXYzine (ATARAX) tablet 10 mg (has no administration in time range)  hydrALAZINE (APRESOLINE) injection 10 mg (has no administration in time range)    Mobility walks with person assist

## 2021-02-26 ENCOUNTER — Encounter (HOSPITAL_COMMUNITY): Payer: Self-pay | Admitting: Internal Medicine

## 2021-02-26 ENCOUNTER — Inpatient Hospital Stay (HOSPITAL_COMMUNITY): Payer: Medicare Other | Admitting: Certified Registered Nurse Anesthetist

## 2021-02-26 ENCOUNTER — Ambulatory Visit (HOSPITAL_COMMUNITY): Admission: RE | Admit: 2021-02-26 | Payer: Medicare Other | Source: Ambulatory Visit | Admitting: Gastroenterology

## 2021-02-26 ENCOUNTER — Encounter (HOSPITAL_COMMUNITY)
Admission: EM | Disposition: A | Discharge status: Home / Self Care | Payer: Self-pay | Source: Home / Self Care | Attending: Family Medicine

## 2021-02-26 ENCOUNTER — Inpatient Hospital Stay (HOSPITAL_COMMUNITY): Payer: Medicare Other

## 2021-02-26 DIAGNOSIS — K838 Other specified diseases of biliary tract: Secondary | ICD-10-CM | POA: Diagnosis not present

## 2021-02-26 DIAGNOSIS — K831 Obstruction of bile duct: Principal | ICD-10-CM

## 2021-02-26 DIAGNOSIS — Z9889 Other specified postprocedural states: Secondary | ICD-10-CM

## 2021-02-26 DIAGNOSIS — R932 Abnormal findings on diagnostic imaging of liver and biliary tract: Secondary | ICD-10-CM | POA: Diagnosis not present

## 2021-02-26 DIAGNOSIS — R7989 Other specified abnormal findings of blood chemistry: Secondary | ICD-10-CM

## 2021-02-26 DIAGNOSIS — D490 Neoplasm of unspecified behavior of digestive system: Secondary | ICD-10-CM

## 2021-02-26 DIAGNOSIS — C221 Intrahepatic bile duct carcinoma: Secondary | ICD-10-CM | POA: Diagnosis not present

## 2021-02-26 HISTORY — PX: SPHINCTEROTOMY: SHX5544

## 2021-02-26 HISTORY — PX: BIOPSY: SHX5522

## 2021-02-26 HISTORY — PX: BILIARY BRUSHING: SHX6843

## 2021-02-26 HISTORY — PX: SPYGLASS CHOLANGIOSCOPY: SHX5441

## 2021-02-26 HISTORY — PX: REMOVAL OF STONES: SHX5545

## 2021-02-26 HISTORY — PX: ERCP: SHX5425

## 2021-02-26 LAB — COMPREHENSIVE METABOLIC PANEL
ALT: 98 U/L — ABNORMAL HIGH (ref 0–44)
AST: 77 U/L — ABNORMAL HIGH (ref 15–41)
Albumin: 2.8 g/dL — ABNORMAL LOW (ref 3.5–5.0)
Alkaline Phosphatase: 264 U/L — ABNORMAL HIGH (ref 38–126)
Anion gap: 8 (ref 5–15)
BUN: 22 mg/dL (ref 8–23)
CO2: 23 mmol/L (ref 22–32)
Calcium: 9.2 mg/dL (ref 8.9–10.3)
Chloride: 107 mmol/L (ref 98–111)
Creatinine, Ser: 0.54 mg/dL (ref 0.44–1.00)
GFR, Estimated: >60 mL/min (ref >60)
Glucose, Bld: 88 mg/dL (ref 70–99)
Potassium: 3.7 mmol/L (ref 3.5–5.1)
Sodium: 138 mmol/L (ref 135–145)
Total Bilirubin: 18.7 mg/dL (ref 0.3–1.2)
Total Protein: 6.3 g/dL — ABNORMAL LOW (ref 6.5–8.1)

## 2021-02-26 LAB — PROTIME-INR
INR: 0.9 (ref 0.8–1.2)
Prothrombin Time: 12.6 seconds (ref 11.4–15.2)

## 2021-02-26 LAB — CBC
HCT: 39.1 % (ref 36.0–46.0)
Hemoglobin: 12.7 g/dL (ref 12.0–15.0)
MCH: 28.5 pg (ref 26.0–34.0)
MCHC: 32.5 g/dL (ref 30.0–36.0)
MCV: 87.7 fL (ref 80.0–100.0)
Platelets: 281 10*3/uL (ref 150–400)
RBC: 4.46 MIL/uL (ref 3.87–5.11)
RDW: 17.1 % — ABNORMAL HIGH (ref 11.5–15.5)
WBC: 6.5 10*3/uL (ref 4.0–10.5)
nRBC: 0 % (ref 0.0–0.2)

## 2021-02-26 SURGERY — ERCP, WITH INTERVENTION IF INDICATED
Anesthesia: General

## 2021-02-26 MED ORDER — ONDANSETRON HCL 4 MG/2ML IJ SOLN
INTRAMUSCULAR | Status: DC | PRN
  Administered 2021-02-26: 4 mg via INTRAVENOUS

## 2021-02-26 MED ORDER — CIPROFLOXACIN IN D5W 400 MG/200ML IV SOLN
INTRAVENOUS | Status: DC | PRN
  Administered 2021-02-26: 400 mg via INTRAVENOUS

## 2021-02-26 MED ORDER — FENTANYL CITRATE (PF) 100 MCG/2ML IJ SOLN
INTRAMUSCULAR | Status: DC | PRN
  Administered 2021-02-26: 50 ug via INTRAVENOUS

## 2021-02-26 MED ORDER — CEFAZOLIN SODIUM-DEXTROSE 2-4 GM/100ML-% IV SOLN: 2.0000 g | INTRAVENOUS | Status: DC

## 2021-02-26 MED ORDER — LACTATED RINGERS IV SOLN: INTRAVENOUS | Status: DC | PRN

## 2021-02-26 MED ORDER — INDOMETHACIN 50 MG RE SUPP
RECTAL | Status: DC | PRN
  Administered 2021-02-26: 100 mg via RECTAL

## 2021-02-26 MED ORDER — SUGAMMADEX SODIUM 200 MG/2ML IV SOLN
INTRAVENOUS | Status: DC | PRN
  Administered 2021-02-26: 130 mg via INTRAVENOUS

## 2021-02-26 MED ORDER — LEVOFLOXACIN IN D5W 750 MG/150ML IV SOLN: 750.0000 mg | Freq: Once | INTRAVENOUS | Status: DC

## 2021-02-26 MED ORDER — PROPOFOL 10 MG/ML IV BOLUS
INTRAVENOUS | Status: DC | PRN
  Administered 2021-02-26: 20 mg via INTRAVENOUS
  Administered 2021-02-26: 70 mg via INTRAVENOUS

## 2021-02-26 MED ORDER — CIPROFLOXACIN IN D5W 400 MG/200ML IV SOLN
INTRAVENOUS | Status: AC
  Filled 2021-02-26: qty 200

## 2021-02-26 MED ORDER — ROCURONIUM BROMIDE 10 MG/ML (PF) SYRINGE
PREFILLED_SYRINGE | INTRAVENOUS | Status: DC | PRN
  Administered 2021-02-26: 10 mg via INTRAVENOUS
  Administered 2021-02-26: 40 mg via INTRAVENOUS

## 2021-02-26 MED ORDER — GLUCAGON HCL RDNA (DIAGNOSTIC) 1 MG IJ SOLR
INTRAMUSCULAR | Status: DC | PRN
  Administered 2021-02-26 (×4): .25 mg via INTRAVENOUS

## 2021-02-26 MED ORDER — INDOMETHACIN 50 MG RE SUPP
RECTAL | Status: AC
  Filled 2021-02-26: qty 2

## 2021-02-26 MED ORDER — PANTOPRAZOLE SODIUM 40 MG PO TBEC
40.0000 mg | DELAYED_RELEASE_TABLET | Freq: Every day | ORAL | Status: DC
  Administered 2021-02-26 – 2021-03-01 (×4): 40 mg via ORAL
  Filled 2021-02-26 (×4): qty 1

## 2021-02-26 MED ORDER — FENTANYL CITRATE (PF) 100 MCG/2ML IJ SOLN
INTRAMUSCULAR | Status: AC
  Filled 2021-02-26: qty 2

## 2021-02-26 MED ORDER — CIPROFLOXACIN HCL 500 MG PO TABS
500.0000 mg | ORAL_TABLET | Freq: Two times a day (BID) | ORAL | Status: DC
  Administered 2021-02-26 – 2021-02-27 (×2): 500 mg via ORAL
  Filled 2021-02-26 (×2): qty 1

## 2021-02-26 MED ORDER — LIDOCAINE 2% (20 MG/ML) 5 ML SYRINGE
INTRAMUSCULAR | Status: DC | PRN
  Administered 2021-02-26: 60 mg via INTRAVENOUS

## 2021-02-26 MED ORDER — SODIUM CHLORIDE 0.9 % IV SOLN: INTRAVENOUS | Status: AC

## 2021-02-26 MED ORDER — PHENYLEPHRINE 40 MCG/ML (10ML) SYRINGE FOR IV PUSH (FOR BLOOD PRESSURE SUPPORT)
PREFILLED_SYRINGE | INTRAVENOUS | Status: DC | PRN
Start: 2021-02-26 — End: 2021-02-26
  Administered 2021-02-26: 120 ug via INTRAVENOUS
  Administered 2021-02-26 (×2): 80 ug via INTRAVENOUS

## 2021-02-26 MED ORDER — DEXAMETHASONE SODIUM PHOSPHATE 10 MG/ML IJ SOLN
INTRAMUSCULAR | Status: DC | PRN
  Administered 2021-02-26: 4 mg via INTRAVENOUS

## 2021-02-26 MED ORDER — SODIUM CHLORIDE 0.9 % IV SOLN
INTRAVENOUS | Status: DC | PRN
  Administered 2021-02-26: 15:00:00 35 mL

## 2021-02-26 MED ORDER — GLUCAGON HCL RDNA (DIAGNOSTIC) 1 MG IJ SOLR
INTRAMUSCULAR | Status: AC
  Filled 2021-02-26: qty 2

## 2021-02-26 NOTE — Progress Notes (Signed)
Ashley Peters   DOB:1929-01-27   CB#:[id number]   OEH#:[id number]  Oncology follow up note   Subjective: Patient is well-known to me, I saw her last in office on 01/04/2021. She presented with jaundice for week, was admitted for further workup.  She underwent ERCP by Dr. Rush Landmark this afternoon, she was still quite drowsy from procedure when I saw her.  I spoke with her daughter at bedside.   Objective:  Vitals:   02/26/21 1811 02/26/21 2026  BP: 133/70 (!) 155/72  Pulse: 73 75  Resp: 20 16  Temp: (!) 97.5 F (36.4 C) 97.7 F (36.5 C)  SpO2: 95% 98%    Body mass index is 22.17 kg/m.  Intake/Output Summary (Last 24 hours) at 02/26/2021 2155 Last data filed at 02/26/2021 1512 Gross per 24 hour  Intake 1609 ml  Output 5 ml  Net 1604 ml     Sclerae icteric  Oropharynx clear  No peripheral adenopathy  Lungs clear -- no rales or rhonchi  Heart regular rate and rhythm  Abdomen benign   CBG (last 3)  No results for input(s): GLUCAP in the last 72 hours.   Labs:   Urine Studies No results for input(s): UHGB, CRYS in the last 72 hours.  Invalid input(s): UACOL, UAPR, USPG, UPH, UTP, UGL, UKET, UBIL, UNIT, UROB, ULEU, UEPI, UWBC, URBC, UBAC, CAST, UCOM, BILUA  Basic Metabolic Panel: Recent Labs  Lab 02/25/21 1424 02/26/21 0338  NA 136 138  K 3.5 3.7  CL 104 107  CO2 24 23  GLUCOSE 112* 88  BUN 21 22  CREATININE 0.60 0.54  CALCIUM 9.5 9.2   GFR Estimated Creatinine Clearance: 33.6 mL/min (by C-G formula based on SCr of 0.54 mg/dL). Liver Function Tests: Recent Labs  Lab 02/25/21 1424 02/26/21 0338  AST 94* 77*  ALT 121* 98*  ALKPHOS 320* 264*  BILITOT 18.4* 18.7*  PROT 6.9 6.3*  ALBUMIN 3.4* 2.8*   No results for input(s): LIPASE, AMYLASE in the last 168 hours. No results for input(s): AMMONIA in the last 168 hours. Coagulation profile Recent Labs  Lab 02/26/21 0338  INR 0.9    CBC: Recent Labs  Lab 02/25/21 1424 02/26/21 0338  WBC 6.5 6.5   NEUTROABS 5.4  --   HGB 14.6 12.7  HCT 45.1 39.1  MCV 87.1 87.7  PLT 337 281   Cardiac Enzymes: No results for input(s): CKTOTAL, CKMB, CKMBINDEX, TROPONINI in the last 168 hours. BNP: Invalid input(s): POCBNP CBG: No results for input(s): GLUCAP in the last 168 hours. D-Dimer No results for input(s): DDIMER in the last 72 hours. Hgb A1c No results for input(s): HGBA1C in the last 72 hours. Lipid Profile No results for input(s): CHOL, HDL, LDLCALC, TRIG, CHOLHDL, LDLDIRECT in the last 72 hours. Thyroid function studies No results for input(s): TSH, T4TOTAL, T3FREE, THYROIDAB in the last 72 hours.  Invalid input(s): FREET3 Anemia work up No results for input(s): VITAMINB12, FOLATE, FERRITIN, TIBC, IRON, RETICCTPCT in the last 72 hours. Microbiology Recent Results (from the past 240 hour(s))  Resp Panel by RT-PCR (Flu A&B, Covid) Nasopharyngeal Swab     Status: None   Collection Time: 02/25/21  2:25 PM   Specimen: Nasopharyngeal Swab; Nasopharyngeal(NP) swabs in vial transport medium  Result Value Ref Range Status   SARS Coronavirus 2 by RT PCR NEGATIVE NEGATIVE Final    Comment: (NOTE) SARS-CoV-2 target nucleic acids are NOT DETECTED.  The SARS-CoV-2 RNA is generally detectable in upper respiratory specimens during the  acute phase of infection. The lowest concentration of SARS-CoV-2 viral copies this assay can detect is 138 copies/mL. A negative result does not preclude SARS-Cov-2 infection and should not be used as the sole basis for treatment or other patient management decisions. A negative result may occur with  improper specimen collection/handling, submission of specimen other than nasopharyngeal swab, presence of viral mutation(s) within the areas targeted by this assay, and inadequate number of viral copies(<138 copies/mL). A negative result must be combined with clinical observations, patient history, and epidemiological information. The expected result is  Negative.  Fact Sheet for Patients:  EntrepreneurPulse.com.au  Fact Sheet for Healthcare Providers:  IncredibleEmployment.be  This test is no t yet approved or cleared by the [place] FDA and  has been authorized for detection and/or diagnosis of SARS-CoV-2 by FDA under an Emergency Use Authorization (EUA). This EUA will remain  in effect (meaning this test can be used) for the duration of the COVID-19 declaration under Section 564(b)(1) of the Act, 21 U.S.C.section 360bbb-3(b)(1), unless the authorization is terminated  or revoked sooner.       Influenza A by PCR NEGATIVE NEGATIVE Final   Influenza B by PCR NEGATIVE NEGATIVE Final    Comment: (NOTE) The Xpert Xpress SARS-CoV-2/FLU/RSV plus assay is intended as an aid in the diagnosis of influenza from Nasopharyngeal swab specimens and should not be used as a sole basis for treatment. Nasal washings and aspirates are unacceptable for Xpert Xpress SARS-CoV-2/FLU/RSV testing.  Fact Sheet for Patients: EntrepreneurPulse.com.au  Fact Sheet for Healthcare Providers: IncredibleEmployment.be  This test is not yet approved or cleared by the [place] FDA and has been authorized for detection and/or diagnosis of SARS-CoV-2 by FDA under an Emergency Use Authorization (EUA). This EUA will remain in effect (meaning this test can be used) for the duration of the COVID-19 declaration under Section 564(b)(1) of the Act, 21 U.S.C. section 360bbb-3(b)(1), unless the authorization is terminated or revoked.  Performed at [facility], [place] [address] [place], [place] [address]       Studies:  DG ERCP WITH SPHINCTEROTOMY  Result Date: 02/26/2021 CLINICAL DATA:  87-year-old female with history of common bile duct obstruction. EXAM: ERCP TECHNIQUE: Multiple spot images obtained with the fluoroscopic device and submitted for interpretation  post-procedure. FLUOROSCOPY TIME:  Fluoroscopy Time:  12 minutes, 15 seconds Radiation Exposure Index (if provided by the fluoroscopic device): Not provided. Number of Acquired Spot Images: 22 COMPARISON:  MRCP from 02/23/2021 FINDINGS: Endoscope is position of the second portion the duodenum. There is retrograde cannulation of the common bile duct. Limited retrograde cholangiogram was performed with submitted images of limited diagnostic quality with exception of demonstrating a patent ampulla. Choledochoscope is visualized within the common bile duct upon the final image. IMPRESSION: ERCP as above. Please refer to the dedicated procedural/operative note for further procedural details and findings. These images were submitted for radiologic interpretation only. Please see the procedural report for the amount of contrast and the fluoroscopy time utilized. Electronically Signed   By: Ruthann Cancer M.D.   On: 02/26/2021 15:53   DG C-Arm 1-60 Min-No Report  Result Date: 02/26/2021 Fluoroscopy was utilized by the requesting physician.  No radiographic interpretation.   DG C-Arm 1-60 Min-No Report  Result Date: 02/26/2021 Fluoroscopy was utilized by the requesting physician.  No radiographic interpretation.    Assessment: 87 y.o. female   Painless jaundice, possible cholangiocarcinoma History of CBD stone, cholecystitis, abnormal liver lesion on recent MRI/PET, s/p ERCP and  multiple stone removal History of stage II right colon cancer, status post surgery and adjuvant chemo in 2018 Anemia and B12 deficiency   Plan:  -I have reviewed her recent lab and MRI from February 23, 2021, which showed enlarging central liver lesion, which caused significant CBD obstruction.  This is highly concerning for intrahepatic cholangiocarcinoma  -Dr. Rush Landmark attempted ERCP, but was not able to place a stent in CBD, she will likely need PTC by IR  -Bile duct brushing cytology still pending, follow-up in next few  days -I will present her case in our GI tumor board next week, to see if she is a candidate for surgical resection.  Due to the location of the tumor, and her advanced age, surgery would not be easy for her. We will discuss option of radiation and Y90 -Patient was too drowsy to participate in the conversation, per her daughter, patient would like to have work up and consider treatment if possible.  -I will f/u on Thursday    Truitt Merle, MD 02/26/2021

## 2021-02-26 NOTE — Progress Notes (Signed)
[facility] PROGRESS NOTE  Patient: Ashley Peters KJZ:[id number]   PCP: Dettinger, [name], MD DOB: October 29, 1928   DOA: 02/25/2021   DOS: 02/26/2021    Subjective: Patient was taken to the ERCP and spent most of the day in the ERCP.  Seen after the procedure.  Denies any acute complaint.  No nausea or vomiting.  Was eating her dinner.  Objective:  Vitals:   02/26/21 1811 02/26/21 2026  BP: 133/70 (!) 155/72  Pulse: 73 75  Resp: 20 16  Temp: (!) 97.5 F (36.4 C) 97.7 F (36.5 C)  SpO2: 95% 98%    Assessment and plan: Intrahepatic cholangiocarcinoma. Hyperbilirubinemia. Biliary obstruction. Underwent ERCP.  Unable to navigate deep in the pancreatic duct and therefore patient will require bilateral biliary duct drain.  Radiology consulted. Oncology following as well.  HLD Not on statin prior to admit Given elevated LFT's hold off on statin  HTN BP currently stable. Not on bp meds prior to admit Will continue on PRN hydralazine  Hx colon cancer Pt is followed by Dr. Burr Medico   Author: Berle Mull, MD [facility] 02/26/2021 10:13 PM   If 7PM-7AM, please contact night-coverage at [email or web address]

## 2021-02-26 NOTE — Op Note (Signed)
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 Estimated Blood Loss:     Estimated blood loss was minimal. Procedure:                Pre-Anesthesia Assessment:                           - Prior to the procedure, a History and Physical                            was performed, and patient medications and                            allergies were reviewed. The patient's tolerance of                            previous anesthesia was also reviewed. The risks                            and benefits of the procedure and the sedation                            options and risks were discussed with the patient.  All questions were answered, and informed consent                            was obtained. Prior Anticoagulants: The patient has                            taken no previous anticoagulant or antiplatelet                            agents. ASA Grade Assessment: III - A patient with                            severe systemic disease.  After reviewing the risks                            and benefits, the patient was deemed in                            satisfactory condition to undergo the procedure.                           After obtaining informed consent, the scope was                            passed under direct vision. Throughout the                            procedure, the patient's blood pressure, pulse, and                            oxygen saturations were monitored continuously. The                            TJF-Q190V ([id number] Olympus duodenoscope was                            introduced through the mouth, and used to inject                            contrast into and used for direct visualization of                            the bile duct. The ERCP was technically difficult                            and complex due to challenging cannulation because                            of abnormal anatomy. Successful completion of the                            procedure was aided by performing the maneuvers  documented (below) in this report. The patient                            tolerated the procedure. Scope In: Scope Out: Findings:      A scout film of the abdomen was obtained. Surgical clips, consistent       with a previous cholecystectomy, were seen in the area of the right       upper quadrant of the abdomen.      The esophagus was successfully intubated under direct vision without       detailed examination of the pharynx, larynx, and associated structures,       and upper GI tract. The major papilla was located entirely within a       diverticulum and there was evidence of multiple other diverticulae       proximal and distal to the major papilla. The major papilla itself was       edematous. A biliary sphincterotomy had been performed. The       sphincterotomy appeared open.      Standard positioning was not adequate for this cannulation attempt, so       eventually  had to transition to semi-long positioning, that was not       stable and caused me to lose position on multiple occasions initially.       The bile duct could not be cannulated with standard 0.035 straightwire,       it kept going toward the cystic duct stump. Opacification of the cystic       duct was successful after contrast injection in effort of trying to       delineate the distal CBD. After switching to a short 0.025 inch       Revolution angled Jagwire, it was passed into the biliary tree but not       deeply. The Hydratome sphincterotome was passed over the guidewire and       the bile duct was then cannulated partially. Contrast was injected. I       personally interpreted the bile duct images. The flow of contrast       through the ducts was poor. Image quality was suboptimal. Contrast       extended to the main bile duct but not more proximally. Opacification of       the lower third of the main bile duct and middle third of the main bile       duct was successful. The maximum diameter of the ducts was greater than       20 mm. The main bile duct contained filling defects thought to be       intraductal growth and sludge. The biliary sphincterotomy was extended       to a total of 5 mm in length with a monofilament Autotome sphincterotome       using ERBE electrocautery. There was no post-sphincterotomy bleeding. To       discover objects, the biliary tree was swept with a retrieval balloon       starting at the lower third of the main duct. Sludge was swept from the       duct. Cells for cytology were obtained by brushing in the middle third       of the main bile duct with 2 separate brushes.      I decided to see if I could get selective cannulation  access and for       attempt at further tissue aquisition so I transitioned to cholangioscopy       attempt. The bile duct was explored endoscopically using the SpyGlass       direct visualization system. The SpyScope was advanced  to the middle       third of the main bile duct. Visibility with the scope was fair. The       main bile duct contained a infiltrative mass with significant stenosis.       The upper third of the main bile duct was completely obstructed and       could not be traversed with the Spyscope. The mass was biopsied with a       SpyBite miniature biopsy forceps for histology. I could not gain       selective cannulation with the wires while using Spy.      I reattempted cannulation. Only the short 0.025 inch Revolution angled       Antonietta Breach was able to be passed into the biliary tree. I tried to use a       straightwire, but this did not go through the narrowing/stricture and       preferentially went to the cystic stump. Decision made to end the       procedure as I could not readily attain deep cannulation of the proximal       biliary tree due to the severe obstruction as noted on cholangiography.      A pancreatogram was not performed.      The duodenoscope was withdrawn from the patient. Impression:               - The major papilla was located entirely within a                            diverticulum. The major papilla appeared edematous                            and had a prior biliary sphincterotomy.                           - Difficult cannulation as wire had preferential                            placement into the cystic stump but eventually wire                            was able to go into the biliary tree partially.                            Could not attain deep selective cannulation of                            proximal biliary tree.                           - A biliary tract obstruction was found in the  upper third of the main duct as a result of                            mass-like area on fluoroscopy.                           - A biliary sphincterotomy extension was performed                            and this allowed for angled wire  placement.                           - The biliary tree was swept and sludge was found.                           - Cells for cytology obtained in the middle third                            of the main bile duct with 2 brushes.                           - Spyglass cholangioscopy performed. Findings of                            likely biliary tumor (cholangiocarcinoma of the                            entire middle/upper duct visualized via SpyGlass.                            This could not be traversed. This was sampled via                            Spybite.                           - Unable to access proximal biliary tree, so could                            not place any stents unfortunately. Moderate Sedation:      Not Applicable - Patient had care per Anesthesia. Recommendation:           - The patient will be observed post-procedure,                            until all discharge criteria are met.                           - Return patient to hospital ward for ongoing care.                           - Advance diet as tolerated.                           -  Observe patient's clinical course.                           - Watch for pancreatitis, bleeding, perforation,                            and cholangitis.                           - Continue Ciprofloxacin 500 mg twice daily for at                            least next 3-days to decrease risk of post-ERCP                            infectious complications.                           - Will need consultation with Interventional                            Radiology to attempt PBD (potentially on both Right                            and Left) in order to bypass her significant                            mass/stricture.                           - Check liver enzymes (AST, ALT, alkaline                            phosphatase, bilirubin) in the morning.                           - Send CA19-9 tomorrow.                           -  The findings and recommendations were discussed                            with the patient.                           - The findings and recommendations were discussed                            with the patient's family.                           - The findings and recommendations were discussed                            with the referring physician. Procedure Code(s):        --- Professional ---  [phone], Endoscopic retrograde                            cholangiopancreatography (ERCP); with removal of                            calculi/debris from biliary/pancreatic duct(s)                           43262, Endoscopic retrograde                            cholangiopancreatography (ERCP); with                            sphincterotomy/papillotomy                           43273, Endoscopic cannulation of papilla with                            direct visualization of pancreatic/common bile                            duct(s) (List separately in addition to code(s) for                            primary procedure) Diagnosis Code(s):        --- Professional ---                           D49.0, Neoplasm of unspecified behavior of                            digestive system                           K83.1, Obstruction of bile duct                           R17, Unspecified jaundice                           R74.8, Abnormal levels of other serum enzymes                           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 All rights reserved. The codes documented in this report are preliminary and upon coder review may  be revised  to meet current compliance requirements. Justice Britain, MD 02/26/2021 3:37:31 PM Number  of Addenda: 0

## 2021-02-26 NOTE — Transfer of Care (Signed)
Immediate Anesthesia Transfer of Care Note  Patient: AALANI AIKENS  Procedure(s) Performed: ENDOSCOPIC RETROGRADE CHOLANGIOPANCREATOGRAPHY (ERCP) SPHINCTEROTOMY SPYGLASS CHOLANGIOSCOPY BIOPSY BILIARY BRUSHING REMOVAL OF SLUDGE  Patient Location: PACU and Endoscopy Unit  Anesthesia Type:General  Level of Consciousness: awake, alert , oriented and patient cooperative  Airway & Oxygen Therapy: Patient Spontanous Breathing and Patient connected to face mask oxygen  Post-op Assessment: Report given to RN, Post -op Vital signs reviewed and stable and Patient moving all extremities  Post vital signs: Reviewed and stable  Last Vitals:  Vitals Value Taken Time  BP 175/77 02/26/21 1510  Temp 37 C 02/26/21 1510  Pulse 79 02/26/21 1512  Resp 18 02/26/21 1512  SpO2 100 % 02/26/21 1512  Vitals shown include unvalidated device data.  Last Pain:  Vitals:   02/26/21 1510  TempSrc: Axillary  PainSc:          Complications: No notable events documented.

## 2021-02-26 NOTE — Progress Notes (Signed)
CRITICAL VALUE STICKER   CRITICAL VALUE: Total Bilirubin 18.7   RECEIVER (on-site recipient of call): P. Rexine Gowens RN   DATE & TIME NOTIFIED: 02/26/21 0421   MESSENGER (representative from lab): Juleen Starr   MD NOTIFIED: Clarene Essex   TIME OF NOTIFICATION: 0422   RESPONSE:  no new orders

## 2021-02-26 NOTE — Consult Note (Signed)
Referring Provider: Dr. Silvano Rusk Primary Care Physician:  Dettinger, [name], MD Primary Gastroenterologist:  Dr. Silvano Rusk   Reason for Consultation:  Hepatic mass, possible cholangiocarcinoma   HPI: Ashley Peters is a 87 y.o. female with past medical history of hypertension, hyperlipidemia, macular degeneration, kidney stones, colon polyps, colon cancer s/p right hemicolectomy 2018, GERD and ascending cholangitis secondary to choledocholithiasis s/p ERCP with sphincterotomy and stone extraction by Dr. Carlean Purl 09/21/2020.   She was seen in our GI clinic by Dr. Carlean Purl 01/23/2021 due to having elevated LFTs and an abnormal abdominal MRI w/wo contrast 12/04/2020 showed intra and extrahepatic biliary ductal dilatation with a large calculus in the intrahepatic common bile duct measuring 2.6 cm.  A anterior right liver lesion measuring 3.5 x 2.6 and  a central liver lesion 5.0 x 3.1 cm starting for metastatic disease from prior colon cancer versus cholangiocarcinoma. A PET scan 01/01/2021 showed hypermetabolic activity to 2 areas within the liver concerning for metastatic disease.   Dr. Carlean Purl reviewed her case with Dr. Rush Landmark with plans to perform an ERCP as an outpatient. However, she developed dark-colored urine X 2 days and labs were done which showed a total bilirubin level 6.7 (t. Bili 0.4 on 01/23/2021).  Direct bili 4.6.  Alk phos 414.  AST 105.  ALT 143. Repeat labs were done 02/15/2021 which showed a rising total bilirubin level of 10.1.  Alk phos 400.  AST 165.  ALT 177. She subsequently underwent an abdominal MRI/MRCP w/wo contrast on 02/23/2021 which identified an enlarging infiltrating mass to the central liver measuring 7.8 x 4.3 cm with a large portion of this lesion which extended into the intrahepatic biliary tree extending distally into the common hepatic duct and common bile duct causing severe biliary obstruction, findings concerning for cholangiocarcinoma.  She was advised  by Dr. Carlean Purl earlier today to proceed to [facility] ED with the intent for admission by the hospitalist and ERCP tomorrow with Dr. Rush Landmark.   She reported to passing dark-colored urine which started approximately 1 and half weeks ago.  She developed generalized itchiness 1 week ago.  She has lost 5 to 6 pounds over the past 4 weeks.  Appetite reported as fair.  No fevers, sweats or chills.  She typically does not have any nausea or vomiting but she vomited nonbloody emesis/partially digested food following her abdominal MRI on 02/23/2021.  She has heartburn most days.  No upper or lower abdominal pain.  No issues with confusion.  No NSAIDs use.  She is not on any anticoagulation.  She typically passes a normal formed brown bowel movement daily.  She passed 1 gray-colored stool 1 week ago.  No acholic stools.  No rectal bleeding or melena.  Her last surveillance colonoscopy was 12/2017 and 2 hyperplastic and 2 tubular adenomatous polyps were removed from the rectum.  No further colonoscopies were recommended due to age.  Laboratory studies today show a rising total bilirubin level of 18.7.  Alk phos 264.  AST 77.  ALT 98.  WBC 6.5.  Hemoglobin 12.7.  Platelet 281.  INR 0.9.  Hepatic Function Latest Ref Rng & Units 02/19/2021 02/15/2021 01/23/2021  Total Protein 6.0 - 8.3 g/dL 7.0 6.8 6.9  Albumin 3.5 - 5.2 g/dL 3.9 3.7 4.1  AST 0 - 37 U/L 165(H) 105(H) 14  ALT 0 - 35 U/L 177(H) 143(H) 9  Alk Phosphatase 39 - 117 U/L 400(H) 414(H) 58  Total Bilirubin 0.2 - 1.2 mg/dL 10.1(H)  6.7(H) 0.4  Bilirubin, Direct 0.0 - 0.3 mg/dL 6.4(H) 4.6(H) 0.0   ABDOMINAL IMAGING:  Abdominal MRI/MRCP w/wo contrast 02/23/2021: 1. Enlarging malignant lesion in the central aspect of the liver which has imaging characteristics most suggestive of intrahepatic cholangiocarcinoma, which is now invasive of the bile ducts extending distally into the common bile duct causing severe biliary obstruction as demonstrated  by severe intrahepatic biliary dilatation. There are probable malignant lymph nodes in hepatoduodenal and portacaval nodal stations, as above. 2. Additional incidental findings, as above.  PET scan 01/01/2021: 1. 9 mm ground-glass nodule in the posterior right lower lobe is hypermetabolic. This nodule is new since 09/19/2020 while potentially infectious/inflammatory, primary bronchogenic neoplasm or metastatic disease could have this appearance. Close follow-up recommended. 2. 2 areas of low level hypermetabolism in the liver without obvious underlying correlate on CT imaging. Metastatic disease remains a concern. 3.  Aortic Atherosclerois   Abdominal MRI w/wo contrast 12/04/2020: 1. Interval extraction of multiple common bile duct calculi with persistent intra and extrahepatic biliary ductal dilatation, similar in caliber to prior examination.   2. A large calculus remains in the intrahepatic common bile duct measuring at least 2.6 cm.   3. No significant change in heterogeneously enhancing, somewhat ill-defined lesions of the anterior right lobe of the liver, hepatic segment V, or within the central liver. As on prior examination, these remain consistent with either metastases from patient's known colon malignancy or alternately cholangiocarcinoma  PAST GI PROCEDURES:  ERCP 09/21/2020: - Choledocholithiasis with an obstruction was found.Purulence indicating cholangits seen. Complete removal was accomplished by biliary sphincterotomy and basket extraction. - Duodenal diverticulum. Multiple  Colonoscopy 12/23/2017: - Decreased sphincter tone found on digital rectal exam. - Two 3 mm polyps in the rectum and in the descending colon, removed with a cold snare. Resected and retrieved. - One 8 mm polyp in the rectum, removed with a hot snare. Resected and retrieved. - Diverticulosis in the entire examined colon. - The examination was otherwise normal on direct and retroflexion  views. 1. Surgical [P], descending, rectum, polyp (2) - TUBULAR ADENOMA (ONE). - HYPERPLASTIC POLYP (TWO). - NO HIGH GRADE DYSPLASIA OR MALIGNANCY. 2. Surgical [P], rectum, polyp - TUBULAR ADENOMA (ONE). - NO HIGH GRADE DYSPLASIA OR MALIGNANCY.  Colonoscopy 12/29/2001: Diverticulosis descending and sigmoid colon No polyp  Past Medical History:  Diagnosis Date   Anemia    Blood transfusion without reported diagnosis    Cancer ([place])    Colon cancer ([place]) dx'd 10/2016   Cystoid macular edema of left eye 07/05/2019   Diverticulosis    Family history of breast cancer    Family history of colon cancer    Family history of uterine cancer    GERD (gastroesophageal reflux disease)    History of kidney stones    Hyperlipidemia    Hypertension    Macular degeneration    Osteopenia     Past Surgical History:  Procedure Laterality Date   ABDOMINAL HYSTERECTOMY     ablation tr Great saphenous vein  10/17/2009   BREAST EXCISIONAL BIOPSY Bilateral 1971   brest biopsy  1971   CHOLECYSTECTOMY     EPIGASTRIC HERNIA REPAIR N/A 10/14/2016   Procedure: REPAIR OF EPIGASTRIC VENTRAL HERNIA;  Surgeon: Donnie Mesa, MD;  Location: [facility];  Service: General;  Laterality: N/A;   ERCP N/A 09/21/2020   Procedure: ENDOSCOPIC RETROGRADE CHOLANGIOPANCREATOGRAPHY (ERCP);  Surgeon: Gatha Mayer, MD;  Location: [facility];  Service: Endoscopy;  Laterality: N/A;   lt. distal ureteral  stone extraction  09/14/1991   PARTIAL COLECTOMY Right 10/14/2016   Procedure: OPEN RIGHT HEMICOLECTOMY;  Surgeon: Donnie Mesa, MD;  Location: [facility];  Service: General;  Laterality: Right;   REMOVAL OF STONES  09/21/2020   Procedure: REMOVAL OF STONES;  Surgeon: Gatha Mayer, MD;  Location: [facility];  Service: Endoscopy;;   SPHINCTEROTOMY  09/21/2020   Procedure: [name] Mayans;  Surgeon: Gatha Mayer, MD;  Location: [facility];  Service: Endoscopy;;   STONE EXTRACTION WITH BASKET  09/21/2020   Procedure: STONE  EXTRACTION WITH BASKET;  Surgeon: Gatha Mayer, MD;  Location: [facility];  Service: Endoscopy;;    Prior to Admission medications   Medication Sig Start Date End Date Taking? Authorizing Provider  acetaminophen (TYLENOL) 500 MG tablet Take 2 tablets (1,000 mg total) by mouth every 6 (six) hours as needed for moderate pain. Patient taking differently: Take 500-1,000 mg by mouth every 6 (six) hours as needed for moderate pain. 10/17/16  Yes Donnie Mesa, MD  alendronate (FOSAMAX) 70 MG tablet Take with a full glass of water on an empty stomach. Patient taking differently: Take 70 mg by mouth every Monday. Take with a full glass of water on an empty stomach. 02/22/20  Yes Dettinger, [name], MD  Cyanocobalamin 1000 MCG/ML KIT Inject 1,000 mcg as directed every 30 (thirty) days.   Yes [provider]  Multiple Vitamins-Minerals (PRESERVISION AREDS 2 PO) Take 1 capsule by mouth 2 (two) times daily.   Yes [provider]    Current Facility-Administered Medications  Medication Dose Route Frequency Provider Last Rate Last Admin   cholestyramine (QUESTRAN) packet 4 g  4 g Oral BID PRN Donne Hazel, MD       hydrALAZINE (APRESOLINE) injection 10 mg  10 mg Intravenous Q4H PRN Donne Hazel, MD   10 mg at 02/26/21 0205   hydrOXYzine (ATARAX) tablet 10 mg  10 mg Oral TID PRN Donne Hazel, MD        Allergies as of 02/25/2021 - Review Complete 02/25/2021  Allergen Reaction Noted   Benicar [olmesartan medoxomil] Other (See Comments) 07/15/2010   Amoxicillin Rash 07/15/2010    Family History  Problem Relation Age of Onset   Stroke Father 10   Breast cancer Sister        dx 43's, died at 49   Seizures Son    Diabetes Brother    Kidney disease Brother    Heart disease Brother    Epilepsy Son    Colon cancer Other 57   Uterine cancer Other 33   Hodgkin's lymphoma Other    Breast cancer Other     Social History   Socioeconomic History   Marital status:  Widowed    Spouse name: Not on file   Number of children: 3   Years of education: 27   Highest education level: Some college, no degree  Occupational History   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  Tobacco Use   Smoking status: Never   Smokeless tobacco: Never  Vaping Use   Vaping Use: Never used  Substance and Sexual Activity   Alcohol use: No   Drug use: No   Sexual activity: Not Currently    Birth control/protection: None  Other Topics Concern   Not on file  Social History Narrative   Lives alone - daughter and son live 2 miles away   Social Determinants of Health   Financial Resource Strain: Low Risk  Difficulty of Paying Living Expenses: Not hard at all  Food Insecurity: No Food Insecurity   Worried About [place] in the Last Year: Never true   Ran Out of Food in the Last Year: Never true  Transportation Needs: No Transportation Needs   Lack of Transportation (Medical): No   Lack of Transportation (Non-Medical): No  Physical Activity: Insufficiently Active   Days of Exercise per Week: 7 days   Minutes of Exercise per Session: 20 min  Stress: No Stress Concern Present   Feeling of Stress : Not at all  Social Connections: Moderately Integrated   Frequency of Communication with Friends and Family: More than three times a week   Frequency of Social Gatherings with Friends and Family: More than three times a week   Attends Religious Services: More than 4 times per year   Active Member of [employer] or Organizations: Yes   Attends Archivist Meetings: More than 4 times per year   Marital Status: Widowed  Human resources officer Violence: Not At Risk   Fear of Current or Ex-Partner: No   Emotionally Abused: No   Physically Abused: No   Sexually Abused: No    Review of Systems: Gen: See HPI.  CV: Denies chest pain, palpitations or edema. Resp: Denies cough, shortness of breath of hemoptysis.  GI: See HPI.  GU : + Darker colored urine.  MS: Denies  joint pain, muscles aches or weakness. Derm: + Generalized itchiness.  Psych: Denies depression, anxiety, memory loss or confusion.  Heme: Denies easy bruising, bleeding. Neuro:  Denies headaches, dizziness or paresthesias. Endo:  Denies any problems with DM, thyroid or adrenal function.  Physical Exam: Vital signs in last 24 hours: Temp:  [97.3 F (36.3 C)-98.5 F (36.9 C)] 98.5 F (36.9 C) (12/20 0542) Pulse Rate:  [70-88] 88 (12/20 0542) Resp:  [16-17] 16 (12/20 0542) BP: (126-184)/(68-85) 127/78 (12/20 0542) SpO2:  [96 %-99 %] 98 % (12/20 0542) Weight:  [51.5 kg] 51.5 kg (12/19 1313) Last BM Date: 02/25/21 General:  Alert 87 year old female in no acute distress. Head:  Normocephalic and atraumatic. Eyes: Moderate scleral icterus.  Conjunctiva pink. Ears:  Normal auditory acuity. Nose:  No deformity, discharge or lesions. Mouth: Upper and lower dentures intact.  White coating on tongue.  No ulcers or lesions.  Neck:  Supple. No lymphadenopathy or thyromegaly.  Lungs: Breath sounds clear throughout. Heart: Regular rate and rhythm, no murmurs. Abdomen: Soft, nondistended.  No palpable mass.  No hepatomegaly.  Positive bowel sounds all 4 quadrants. Rectal: Deferred. Musculoskeletal:  Symmetrical without gross deformities.  Pulses:  Normal pulses noted. Extremities:  Without clubbing or edema. Neurologic:  Alert and  oriented x4. No focal deficits.  Skin: Jaundice. Intact without significant lesions or rashes. Psych:  Alert and cooperative. Normal mood and affect.  Intake/Output from previous day: No intake/output data recorded. Intake/Output this shift: No intake/output data recorded.  Lab Results: Recent Labs    02/25/21 1424 02/26/21 0338  WBC 6.5 6.5  HGB 14.6 12.7  HCT 45.1 39.1  PLT 337 281   BMET Recent Labs    02/25/21 1424 02/26/21 0338  NA 136 138  K 3.5 3.7  CL 104 107  CO2 24 23  GLUCOSE 112* 88  BUN 21 22  CREATININE 0.60 0.54  CALCIUM 9.5  9.2   LFT Recent Labs    02/25/21 1424 02/26/21 0338  PROT 6.9 6.3*  ALBUMIN 3.4* 2.8*  AST 94* 77*  ALT 121*  98*  ALKPHOS 320* 264*  BILITOT 18.4* 18.7*  BILIDIR 13.2*  --   IBILI 5.2*  --    PT/INR Recent Labs    02/26/21 0338  LABPROT 12.6  INR 0.9   Hepatitis Panel No results for input(s): HEPBSAG, HCVAB, HEPAIGM, HEPBIGM in the last 72 hours.    Studies/Results: No results found.  IMPRESSION/PLAN:  47) 87 year old female with a history of ascending cholangitis secondary to choledocholithiasis s/p ERCP and sphincterotomy and stone extraction 09/2020 admitted to the hospital 02/25/2021 with elevated LFTS and T. bilirubin levels secondary to a large central liver mass which extends into the intrahepatic biliary tree, common hepatic duct and common bile duct resulting in a severe biliary obstruction per MRI/MRCP, concerning for cholangiocarcinoma. She is afebrile. WBC 6.5.  Hemodynamically stable. -NPO -Start NS @ 75 cc/hr IV  -Proceed with ERCP possible biliary stent placement with Dr. Rush Landmark today. ERCP benefits and risks discussed including risk with sedation, risk of bleeding, perforation, pancreatitis and infection. -Add direct bili to today's lab draw -CBC, CMP in am -Further recommendations to be determined after ERCP completed    2) History of colon cancer s/p right hemicolectomy 2018. Last surveillance colonoscopy 12/2017 identified 2 hyperplastic and 2 tubular adenomatous rectal polyps.  No further colonoscopies recommended due to age.   3) GERD -Pantoprazole 78m po QD    CNoralyn Pick 02/26/2021, 07:57AM

## 2021-02-26 NOTE — Plan of Care (Signed)
  Problem: Activity: Goal: Risk for activity intolerance will decrease Outcome: Progressing   Problem: Pain Managment: Goal: General experience of comfort will improve Outcome: Progressing   Problem: Safety: Goal: Ability to remain free from injury will improve Outcome: Progressing   

## 2021-02-26 NOTE — Progress Notes (Signed)
°  Transition of Care [facility]) Screening Note   Patient Details  Name: Ashley Peters Date of Birth: November 08, 1928   Transition of [facility]) CM/SW Contact:    Lennart Pall, LCSW Phone Number: 02/26/2021, 2:30 PM    Transition of Care Department [facility]) has reviewed patient and no TOC needs have been identified at this time. We will continue to monitor patient advancement through interdisciplinary progression rounds. If new patient transition needs arise, please place a TOC consult.  Ashley Nichelson, LCSW

## 2021-02-26 NOTE — Anesthesia Procedure Notes (Signed)
Procedure Name: Intubation Date/Time: 02/26/2021 12:39 PM Performed by: Genelle Bal, CRNA Pre-anesthesia Checklist: Patient identified, Emergency Drugs available, Suction available and Patient being monitored Patient Re-evaluated:Patient Re-evaluated prior to induction Oxygen Delivery Method: Circle system utilized Preoxygenation: Pre-oxygenation with 100% oxygen Induction Type: IV induction Ventilation: Mask ventilation without difficulty Laryngoscope Size: Miller and 2 Grade View: Grade I Tube type: Oral Tube size: 7.0 mm Number of attempts: 1 Airway Equipment and Method: Stylet and Oral airway Placement Confirmation: ETT inserted through vocal cords under direct vision, positive ETCO2 and breath sounds checked- equal and bilateral Secured at: 21 cm Tube secured with: Tape Dental Injury: Teeth and Oropharynx as per pre-operative assessment

## 2021-02-26 NOTE — Anesthesia Preprocedure Evaluation (Addendum)
Anesthesia Evaluation  Patient identified by MRN, date of birth, ID band Patient awake    Reviewed: Allergy & Precautions, NPO status , Patient's Chart, lab work & pertinent test results  History of Anesthesia Complications Negative for: history of anesthetic complications  Airway Mallampati: II  TM Distance: >3 FB Neck ROM: Full    Dental  (+) Upper Dentures, Lower Dentures   Pulmonary neg pulmonary ROS,    Pulmonary exam normal        Cardiovascular hypertension, Normal cardiovascular exam     Neuro/Psych negative neurological ROS  negative psych ROS   GI/Hepatic GERD  ,(+) Hepatitis -bile duct obstruction, colon ca   Endo/Other  negative endocrine ROS  Renal/GU negative Renal ROS  negative genitourinary   Musculoskeletal negative musculoskeletal ROS (+)   Abdominal   Peds  Hematology negative hematology ROS (+)   Anesthesia Other Findings Day of surgery medications reviewed with patient.  Reproductive/Obstetrics negative OB ROS                            Anesthesia Physical Anesthesia Plan  ASA: 3  Anesthesia Plan: General   Post-op Pain Management: Tylenol PO (pre-op)   Induction: Intravenous  PONV Risk Score and Plan: 3 and Treatment may vary due to age or medical condition, Ondansetron and Dexamethasone  Airway Management Planned: Oral ETT  Additional Equipment: None  Intra-op Plan:   Post-operative Plan: Extubation in OR  Informed Consent: I have reviewed the patients History and Physical, chart, labs and discussed the procedure including the risks, benefits and alternatives for the proposed anesthesia with the patient or authorized representative who has indicated his/her understanding and acceptance.     Dental advisory given  Plan Discussed with: CRNA  Anesthesia Plan Comments:        Anesthesia Quick Evaluation

## 2021-02-27 ENCOUNTER — Encounter: Payer: Self-pay | Admitting: Gastroenterology

## 2021-02-27 ENCOUNTER — Encounter (HOSPITAL_COMMUNITY): Payer: Self-pay | Admitting: Gastroenterology

## 2021-02-27 ENCOUNTER — Inpatient Hospital Stay (HOSPITAL_COMMUNITY): Payer: Medicare Other

## 2021-02-27 DIAGNOSIS — K831 Obstruction of bile duct: Secondary | ICD-10-CM | POA: Diagnosis not present

## 2021-02-27 DIAGNOSIS — I10 Essential (primary) hypertension: Secondary | ICD-10-CM | POA: Diagnosis not present

## 2021-02-27 DIAGNOSIS — D649 Anemia, unspecified: Secondary | ICD-10-CM

## 2021-02-27 DIAGNOSIS — R160 Hepatomegaly, not elsewhere classified: Secondary | ICD-10-CM

## 2021-02-27 HISTORY — PX: IR PERCUTANEOUS TRANSHEPATIC CHOLANGIOGRAM: IMG6042

## 2021-02-27 LAB — HEPATIC FUNCTION PANEL
ALT: 83 U/L — ABNORMAL HIGH (ref 0–44)
AST: 76 U/L — ABNORMAL HIGH (ref 15–41)
Albumin: 2.6 g/dL — ABNORMAL LOW (ref 3.5–5.0)
Alkaline Phosphatase: 267 U/L — ABNORMAL HIGH (ref 38–126)
Bilirubin, Direct: 10.9 mg/dL — ABNORMAL HIGH (ref 0.0–0.2)
Indirect Bilirubin: 7.2 mg/dL — ABNORMAL HIGH (ref 0.3–0.9)
Total Bilirubin: 18.1 mg/dL (ref 0.3–1.2)
Total Protein: 5.5 g/dL — ABNORMAL LOW (ref 6.5–8.1)

## 2021-02-27 LAB — PROTIME-INR
INR: 1 (ref 0.8–1.2)
Prothrombin Time: 12.7 seconds (ref 11.4–15.2)

## 2021-02-27 LAB — CBC
HCT: 35.4 % — ABNORMAL LOW (ref 36.0–46.0)
Hemoglobin: 11.7 g/dL — ABNORMAL LOW (ref 12.0–15.0)
MCH: 28.3 pg (ref 26.0–34.0)
MCHC: 33.1 g/dL (ref 30.0–36.0)
MCV: 85.5 fL (ref 80.0–100.0)
Platelets: 236 10*3/uL (ref 150–400)
RBC: 4.14 MIL/uL (ref 3.87–5.11)
RDW: 17.2 % — ABNORMAL HIGH (ref 11.5–15.5)
WBC: 5 10*3/uL (ref 4.0–10.5)
nRBC: 0 % (ref 0.0–0.2)

## 2021-02-27 LAB — BASIC METABOLIC PANEL
Anion gap: 8 (ref 5–15)
BUN: 29 mg/dL — ABNORMAL HIGH (ref 8–23)
CO2: 22 mmol/L (ref 22–32)
Calcium: 8.6 mg/dL — ABNORMAL LOW (ref 8.9–10.3)
Chloride: 105 mmol/L (ref 98–111)
Creatinine, Ser: 0.84 mg/dL (ref 0.44–1.00)
GFR, Estimated: >60 mL/min (ref >60)
Glucose, Bld: 190 mg/dL — ABNORMAL HIGH (ref 70–99)
Potassium: 4.1 mmol/L (ref 3.5–5.1)
Sodium: 135 mmol/L (ref 135–145)

## 2021-02-27 LAB — CANCER ANTIGEN 19-9: CA 19-9: 302 U/mL — ABNORMAL HIGH (ref 0–35)

## 2021-02-27 LAB — SURGICAL PATHOLOGY

## 2021-02-27 LAB — CYTOLOGY - NON PAP

## 2021-02-27 MED ORDER — FENTANYL CITRATE (PF) 100 MCG/2ML IJ SOLN
INTRAMUSCULAR | Status: AC | PRN
  Administered 2021-02-27 (×2): 25 ug via INTRAVENOUS

## 2021-02-27 MED ORDER — HYDROCODONE-ACETAMINOPHEN 5-325 MG PO TABS: 1.0000 | ORAL_TABLET | ORAL | Status: DC | PRN

## 2021-02-27 MED ORDER — LEVOFLOXACIN IN D5W 500 MG/100ML IV SOLN
500.0000 mg | INTRAVENOUS | Status: AC
  Administered 2021-02-27: 16:00:00 500 mg via INTRAVENOUS
  Filled 2021-02-27 (×2): qty 100

## 2021-02-27 MED ORDER — IOHEXOL 300 MG/ML  SOLN: 50.0000 mL | Freq: Once | INTRAMUSCULAR | Status: DC | PRN

## 2021-02-27 MED ORDER — MIDAZOLAM HCL 2 MG/2ML IJ SOLN
INTRAMUSCULAR | Status: AC | PRN
  Administered 2021-02-27 (×2): .5 mg via INTRAVENOUS

## 2021-02-27 MED ORDER — SODIUM CHLORIDE 0.9% FLUSH
5.0000 mL | Freq: Three times a day (TID) | INTRAVENOUS | Status: DC
  Administered 2021-02-27 – 2021-02-28 (×3): 5 mL

## 2021-02-27 MED ORDER — CIPROFLOXACIN HCL 500 MG PO TABS
500.0000 mg | ORAL_TABLET | Freq: Two times a day (BID) | ORAL | Status: DC
  Administered 2021-02-28 – 2021-03-01 (×3): 500 mg via ORAL
  Filled 2021-02-27 (×3): qty 1

## 2021-02-27 MED ORDER — FENTANYL CITRATE (PF) 100 MCG/2ML IJ SOLN
INTRAMUSCULAR | Status: AC
  Filled 2021-02-27: qty 2

## 2021-02-27 MED ORDER — MIDAZOLAM HCL 2 MG/2ML IJ SOLN
INTRAMUSCULAR | Status: AC
  Filled 2021-02-27: qty 2

## 2021-02-27 MED ORDER — LIDOCAINE HCL 1 % IJ SOLN
INTRAMUSCULAR | Status: AC
  Filled 2021-02-27: qty 20

## 2021-02-27 NOTE — Procedures (Signed)
°  Procedure: Perc transhepatic cholangiogram, perc biliary int/ext drain placement   EBL:   minimal Complications:  none immediate  See full dictation in [employer].  Dillard Cannon MD Main # [phone] Pager  [phone] Mobile [phone]

## 2021-02-27 NOTE — Assessment & Plan Note (Signed)
Not on meds at home -Continue PRN hydralazine

## 2021-02-27 NOTE — Progress Notes (Signed)
°  Progress Note    SAMRA PESCH   XLK:[id number]  DOB: 31-May-1928  DOA: 02/25/2021     2 Date of Service: 02/27/2021      Brief summary: Mrs. Vanantwerp is a 87 y.o. F with hx colon CA s/p R hemicolectomy 2018, HTN, and recent choledocholithiasis with cholangitis s/p ERCP in Jul 2022 more recently found to have a 5 cm liver lesions, intra and extrahepatic ductal dilation on MRI, hypermetabolic on PET, who presented to GI office with dark urine and painless jaundice and was sent to the ER.  12/20: Underwent ERCP, sphincterotomy; infiltrative mass noted in the main bile duct        Assessment and Plan * Common bile duct (CBD) obstruction - Consult IR for perc drain -Continue Cipro, Questran - Check CBC and LFT tomorrow  Liver mass - Outpatient Tumor board next week  HTN (hypertension) Not on meds at home -Continue PRN hydralazine  Anemia     Hgb down to 11.7 g/dL, no clinical bleeding     Repeat CBC tomorrow post-procedure   Subjective:  Patient without fever, chills, abdominal pain, back pain, confusion.  Objective Vitals:   02/27/21 1640 02/27/21 1645 02/27/21 1648 02/27/21 1704  BP: (!) 157/66 (!) 167/73 (!) 162/69 (!) 181/69  Pulse: 75 77 74 70  Resp: 17 (!) 22 19 15   Temp:      TempSrc:      SpO2: 96% 94% 93% 95%  Weight:      Height:       51.5 kg  Vital signs were reviewed and unremarkable except for: Blood pressure: Elevated    Exam Physical Exam Constitutional:      Appearance: She is not ill-appearing.     Comments: Elderly  Eyes:     General: Scleral icterus present.  Cardiovascular:     Rate and Rhythm: Normal rate.     Heart sounds: No murmur heard.   No gallop.  Pulmonary:     Effort: Pulmonary effort is normal. No respiratory distress.     Breath sounds: No wheezing or rales.  Abdominal:     Palpations: Abdomen is soft.     Tenderness: There is abdominal tenderness (Mild, diffuse). There is guarding.  Skin:    Coloration: Skin  is jaundiced.  Neurological:     General: No focal deficit present.     Mental Status: She is alert and oriented to person, place, and time.     Motor: No weakness.     Gait: Gait normal.  Psychiatric:        Mood and Affect: Mood normal.        Behavior: Behavior normal.       Labs / Other Information My review of labs, imaging, notes and other tests is significant for Elevated bilirubin, no change, hemoglobin stable     Disposition Plan: Status is: Inpatient  Remains inpatient appropriate because: She will require stents, monitoring for 24 to 48 hours for leukocytosis, fever, chills, transaminitis  Daughter updated at the bedside         [facility] 02/27/2021, 5:27 PM

## 2021-02-27 NOTE — Anesthesia Postprocedure Evaluation (Signed)
Anesthesia Post Note  Patient: Ashley Peters  Procedure(s) Performed: ENDOSCOPIC RETROGRADE CHOLANGIOPANCREATOGRAPHY (ERCP) SPHINCTEROTOMY SPYGLASS CHOLANGIOSCOPY BIOPSY BILIARY BRUSHING REMOVAL OF SLUDGE     Patient location during evaluation: PACU Anesthesia Type: General Level of consciousness: awake and alert Pain management: pain level controlled Vital Signs Assessment: post-procedure vital signs reviewed and stable Respiratory status: spontaneous breathing, nonlabored ventilation and respiratory function stable Cardiovascular status: blood pressure returned to baseline Postop Assessment: no apparent nausea or vomiting Anesthetic complications: no                Marthenia Rolling

## 2021-02-27 NOTE — Progress Notes (Addendum)
[facility] Progress Note  CC:  Hepatic mass, suspected cholangiocarcinoma   Subjective:  She denies having any N/V or abdominal pain.  She continues to have generalized itchiness.  No BM. No CP or SOB.  Her daughter is at the bedside.  Objective:   S/P ERCP 02/26/2021: - The major papilla was located entirely within a diverticulum. The major papilla appeared edematous and had a prior biliary sphincterotomy - Difficult cannulation as wire had preferential placement into the cystic stump but eventually wire was able to go into the biliary tree partially. Could not attain deep selective cannulation of proximal biliary tree. - A biliary tract obstruction was found in the upper third of the main duct as a result of mass-like area on fluoroscopy. - A biliary sphincterotomy extension was performed and this allowed for angled wire placement. - The biliary tree was swept and sludge was found. - Cells for cytology obtained in the middle third of the main bile duct with 2 brushes. - Spyglass cholangioscopy performed. 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 This could not be traversed. This was sampled via Spybite. - Unable to access proximal biliary tree, so could not place any stents unfortunately.   Vital signs in last 24 hours: Temp:  [97.5 F (36.4 C)-98.6 F (37 C)] 98.1 F (36.7 C) (12/21 0539) Pulse Rate:  [65-87] 65 (12/21 0539) Resp:  [13-20] 16 (12/21 0539) BP: (123-175)/(56-97) 123/97 (12/21 0539) SpO2:  [93 %-100 %] 97 % (12/21 0539) Weight:  [51.5 kg] 51.5 kg (12/20 1151) Last BM Date: 02/25/21 General: Alert 87 year old female in no acute distress. Eyes: Moderate scleral icterus. Heart: Regular rate and rhythm, no murmurs. Pulm: Breath sounds clear throughout. Abdomen: Soft, nondistended.  Nontender.  Positive bowel sounds all 4 quadrants. Extremities:  Without edema. Neurologic:   Alert and  oriented x4. Grossly normal neurologically. Psych:  Alert and cooperative. Normal mood and affect. Skin: Moderate jaundice present  Intake/Output from previous day: 12/20 0701 - 12/21 0700 In: 1729 [P.O.:120; I.V.:1409; IV Piggyback:200] Out: 5 [Blood:5] Intake/Output this shift: No intake/output data recorded.  Lab Results: Recent Labs    02/25/21 1424 02/26/21 0338 02/27/21 0017  WBC 6.5 6.5 5.0  HGB 14.6 12.7 11.7*  HCT 45.1 39.1 35.4*  PLT 337 281 236   BMET Recent Labs    02/25/21 1424 02/26/21 0338 02/27/21 0017  NA 136 138 135  K 3.5 3.7 4.1  CL 104 107 105  CO2 '24 23 22  ' GLUCOSE 112* 88 190*  BUN 21 22 29*  CREATININE 0.60 0.54 0.84  CALCIUM 9.5 9.2 8.6*   LFT Recent Labs    02/27/21 0017  PROT 5.5*  ALBUMIN 2.6*  AST 76*  ALT 83*  ALKPHOS 267*  BILITOT 18.1*  BILIDIR 10.9*  IBILI 7.2*   PT/INR Recent Labs    02/26/21 0338 02/27/21 0017  LABPROT 12.6 12.7  INR 0.9 1.0   Hepatitis Panel No results for input(s): HEPBSAG, HCVAB, HEPAIGM, HEPBIGM in the last 72 hours.  DG ERCP WITH SPHINCTEROTOMY  Result Date: 02/26/2021 CLINICAL DATA:  8-year-old female with history of common bile duct obstruction. EXAM: ERCP TECHNIQUE: Multiple spot images obtained with the fluoroscopic device and submitted for interpretation post-procedure. FLUOROSCOPY TIME:  Fluoroscopy Time:  12 minutes, 15 seconds Radiation Exposure Index (if provided by the fluoroscopic device): Not provided. Number of Acquired Spot Images: 22 COMPARISON:  MRCP from 02/23/2021 FINDINGS: Endoscope is position of  the second portion the duodenum. There is retrograde cannulation of the common bile duct. Limited retrograde cholangiogram was performed with submitted images of limited diagnostic quality with exception of demonstrating a patent ampulla. Choledochoscope is visualized within the common bile duct upon the final image. IMPRESSION: ERCP as above. Please refer to the dedicated  procedural/operative note for further procedural details and findings. These images were submitted for radiologic interpretation only. Please see the procedural report for the amount of contrast and the fluoroscopy time utilized. Electronically Signed   By: Ruthann Cancer M.D.   On: 02/26/2021 15:53   DG C-Arm 1-60 Min-No Report  Result Date: 02/26/2021 Fluoroscopy was utilized by the requesting physician.  No radiographic interpretation.   DG C-Arm 1-60 Min-No Report  Result Date: 02/26/2021 Fluoroscopy was utilized by the requesting physician.  No radiographic interpretation.    Assessment / Plan:  24) 87 year old female with a history of ascending cholangitis secondary to choledocholithiasis s/p ERCP and sphincterotomy and stone extraction 09/2020 admitted to the hospital 02/25/2021 with elevated LFTS and T. bilirubin levels secondary to a large central liver mass which extends into the intrahepatic biliary tree, common hepatic duct and common bile duct resulting in a severe biliary obstruction per MRI/MRCP, concerning for cholangiocarcinoma. S/P ERCP 02/26/2021 which identified a biliary obstruction in the upper third of the main duct, a sphincterotomy was done, sludge was found in the biliary tree and biliary brushings were obtained. A spyglass cholangioscopy was performed which identified a biliary tumor in the middle upper duct which could not be traversed and access of the proximal biliary tree was not obtained therefore stents were not place. IR consulted for PTC with biliary drain placements and possible biliary brushings. LFTs stable post ERCP. Alk phos 267. T. Bili 18.1. Direct bili 10.9. AST 76. ALT 83. Ca 19- 9 elevated at 302. She is afebrile. WBC 5.0.  Hemodynamically stable. -Proceed with PTC with biliary drain placement per IR later today -Continue Pantoprazole 45m QD -Continue Cipro 501mbid x 3 3 days to reduce the risk of post ERCP infectious complications -Cholestyramine 4 g  p.o. twice daily as needed for pruritus -Await ERCP brush cytology results -Hepatic panel and CBC in am   2) History of colon cancer s/p right hemicolectomy 2018. Last surveillance colonoscopy 12/2017 identified 2 hyperplastic and 2 tubular adenomatous rectal polyps.  No further colonoscopies recommended due to age.   3) GERD -Continue Pantoprazole 4071mo QD          Principal Problem:   Biliary obstruction Active Problems:   HTN (hypertension)   Liver mass     LOS: 2 days   ColNoralyn Pick2/21/2022, 10:47 AM

## 2021-02-27 NOTE — Consult Note (Addendum)
Chief Complaint: Patient was seen in consultation today for percutaneous transhepatic cholangiogram with biliary drain placements/possible biliary brushings  Chief Complaint  Patient presents with   Jaundice    Referring Physician(s): Mansouraty,G  Supervising Physician: Arne Cleveland  Patient Status: [facility] - In-pt  History of Present Illness: Ashley Peters is a 87 y.o. female with PMH significant for anemia/B12 deficiency, diverticulosis, GERD, nephrolithiasis, hyperlipidemia, hypertension, macular degeneration, stage II colon cancer-status post surgery and chemotherapy in 2018.  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  She was admitted in July of this year for epigastric pain and noted to have retained choledocholithiasis.  She had follow-up imaging that was concerning for retained stones even after ERCP with sphincterotomy and stone removal.  Due to persistently elevated LFTs and dark-colored urine patient underwent MRI/MRCP which revealed:  1. Enlarging malignant lesion in the central aspect of the liver which has imaging characteristics most suggestive of intrahepatic cholangiocarcinoma, which is now invasive of the bile ducts extending distally into the common bile duct causing severe biliary obstruction as demonstrated by severe intrahepatic biliary dilatation. There are probable malignant lymph nodes in hepatoduodenal and portacaval nodal stations.  Patient underwent ERCP yesterday which revealed:   The major papilla was located entirely within a diverticulum. The major papilla appeared edematous and had a prior biliary sphincterotomy. - Difficult cannulation as wire had preferential placement into the cystic stump but eventually wire was able to go into the biliary tree partially. Could not attain deep selective cannulation of proximal biliary tree. - A biliary tract  obstruction was found in the upper third of the main duct as a result of mass-like area on fluoroscopy. - A biliary sphincterotomy extension was performed and this allowed for angled wire placement. - The biliary tree was swept and sludge was found. - Cells for cytology obtained in the middle third of the main bile duct with 2 brushes. - Spyglass cholangioscopy performed. 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 This could not be traversed. This was sampled via Spybite. - Unable to access proximal biliary tree, so no stents could be placed   Request now received from GI team for PTC with biliary drain placements, possible biliary brushings; ERCP specimen path pending, CA 19-9  302  Current labs include total bilirubin 18.1, creatinine normal, WBC normal, hemoglobin 11.7, platelets normal, PT/INR normal Past Medical History:  Diagnosis Date   Anemia    Blood transfusion without reported diagnosis    Cancer ([place])    Colon cancer ([place]) dx'd 10/2016   Cystoid macular edema of left eye 07/05/2019   Diverticulosis    Family history of breast cancer    Family history of colon cancer    Family history of uterine cancer    GERD (gastroesophageal reflux disease)    History of kidney stones    Hyperlipidemia    Hypertension    Macular degeneration    Osteopenia     Past Surgical History:  Procedure Laterality Date   ABDOMINAL HYSTERECTOMY     ablation tr Great saphenous vein  10/17/2009   BREAST EXCISIONAL BIOPSY Bilateral 1971   brest biopsy  1971   CHOLECYSTECTOMY     EPIGASTRIC HERNIA REPAIR N/A 10/14/2016   Procedure: REPAIR OF EPIGASTRIC VENTRAL HERNIA;  Surgeon: Donnie Mesa, MD;  Location: [place];  Service: General;  Laterality: N/A;   ERCP N/A 09/21/2020  Procedure: ENDOSCOPIC RETROGRADE CHOLANGIOPANCREATOGRAPHY (ERCP);  Surgeon: Gatha Mayer, MD;  Location: [facility];  Service: Endoscopy;  Laterality:  N/A;   lt. distal ureteral stone extraction  09/14/1991   PARTIAL COLECTOMY Right 10/14/2016   Procedure: OPEN RIGHT HEMICOLECTOMY;  Surgeon: Donnie Mesa, MD;  Location: [place];  Service: General;  Laterality: Right;   REMOVAL OF STONES  09/21/2020   Procedure: REMOVAL OF STONES;  Surgeon: Gatha Mayer, MD;  Location: [facility];  Service: Endoscopy;;   SPHINCTEROTOMY  09/21/2020   Procedure: [name] Mayans;  Surgeon: Gatha Mayer, MD;  Location: [facility];  Service: Endoscopy;;   STONE EXTRACTION WITH BASKET  09/21/2020   Procedure: STONE EXTRACTION WITH BASKET;  Surgeon: Gatha Mayer, MD;  Location: [facility];  Service: Endoscopy;;    Allergies: Benicar [olmesartan medoxomil] and Amoxicillin  Medications: Prior to Admission medications   Medication Sig Start Date End Date Taking? Authorizing Provider  acetaminophen (TYLENOL) 500 MG tablet Take 2 tablets (1,000 mg total) by mouth every 6 (six) hours as needed for moderate pain. Patient taking differently: Take 500-1,000 mg by mouth every 6 (six) hours as needed for moderate pain. 10/17/16  Yes Donnie Mesa, MD  alendronate (FOSAMAX) 70 MG tablet Take with a full glass of water on an empty stomach. Patient taking differently: Take 70 mg by mouth every Monday. Take with a full glass of water on an empty stomach. 02/22/20  Yes Dettinger, [name], MD  Cyanocobalamin 1000 MCG/ML KIT Inject 1,000 mcg as directed every 30 (thirty) days.   Yes [provider]  Multiple Vitamins-Minerals (PRESERVISION AREDS 2 PO) Take 1 capsule by mouth 2 (two) times daily.   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  Tobacco Use   Smoking status: Never   Smokeless tobacco: Never  Vaping Use   Vaping Use: Never used  Substance and Sexual Activity   Alcohol use: No   Drug use: No   Sexual activity: Not Currently    Birth control/protection: None  Other Topics Concern   Not on file  Social History Narrative   Lives alone - daughter and son live 2 miles away   Social Determinants of Health   Financial Resource Strain: Low Risk    Difficulty of Paying Living Expenses: Not hard at all  Food Insecurity: No Food Insecurity   Worried About Charity fundraiser in the Last Year: Never true   Arboriculturist in the Last Year: Never true  Transportation Needs: No Transportation Needs   Lack of Transportation (Medical): No   Lack of Transportation (Non-Medical): No  Physical Activity: Insufficiently Active   Days of Exercise per Week: 7 days  Minutes of Exercise per Session: 20 min  Stress: No Stress Concern Present   Feeling of Stress : Not at all  Social Connections: Moderately Integrated   Frequency of Communication with Friends and Family: More than three times a week   Frequency of Social Gatherings with Friends and Family: More than three times a week   Attends Religious Services: More than 4 times per year   Active Member of [employer] or Organizations: Yes   Attends Archivist Meetings: More than 4 times per year   Marital Status: Widowed      Review of Systems denies fevers, headache, chest pain, dyspnea, cough, worsening abdominal pain, nausea, vomiting or bleeding.  She is jaundiced.  Vital Signs: BP (!) 123/97 (BP Location: Right Arm)    Pulse 65    Temp 98.1 F (36.7 C) (Oral)    Resp 16    Ht 5' (1.524 m)    Wt  113 lb 8.6 oz (51.5 kg)    SpO2 97%    BMI 22.17 kg/m   Physical Exam awake, alert.  Scleral icterus/jaundice noted; chest clear to auscultation bilaterally.  Heart with regular rate and rhythm.  Abdomen soft, positive bowel sounds, nontender.  No lower extremity edema.  Imaging: MR 3D Recon At Scanner  Result Date: 02/24/2021 CLINICAL DATA:  87 year old female with history of jaundice. History of colon cancer. Evaluate for choledocholithiasis. EXAM: MRI ABDOMEN WITHOUT AND WITH CONTRAST (INCLUDING MRCP) TECHNIQUE: Multiplanar multisequence MR imaging of the abdomen was performed both before and after the administration of intravenous contrast. Heavily T2-weighted images of the biliary and pancreatic ducts were obtained, and three-dimensional MRCP images were rendered by post processing. CONTRAST:  34m GADAVIST GADOBUTROL 1 MMOL/ML IV SOLN COMPARISON:  Prior abdominal MRI 12/04/2020. FINDINGS: Lower chest: Unremarkable. Hepatobiliary: Enlarging infiltrative mass in the central aspect of the liver, predominantly in segment 8 and superior aspect of segment 5, best appreciated on precontrast T1 weighted images (series 14) where the bulkiest portion of this lesion is estimated to measure approximately 7.8 x 4.1 cm. This lesion is heterogeneous in signal intensity, but predominantly T1 hypointense and mildly T2 hyperintense, demonstrating diffusion restriction, heterogeneous enhancement on arterial phase post gadolinium images, and persistent enhancement on delayed images. Notably, a large portion of the lesion extends into the intrahepatic biliary tree, extending distally into the common hepatic duct and common bile duct which are both markedly dilated by the abnormal tumor within. This results in severe intrahepatic biliary ductal dilatation throughout all aspects of the liver. Common bile duct scratch the distal common bile duct below the lesion is normal in caliber measuring 6 mm shortly before the ampulla.  Pancreas: No pancreatic mass. No pancreatic ductal dilatation. No pancreatic or peripancreatic fluid collections or inflammatory changes. Spleen:  Unremarkable. Adrenals/Urinary Tract: Multiple T1 hypointense, T2 hyperintense, nonenhancing lesions in both kidneys are compatible with simple cysts, largest of which is exophytic extending off the lower pole of the left kidney measuring up to 6.7 cm in diameter. No aggressive appearing renal lesions are noted. No hydroureteronephrosis in the visualized portions of the abdomen. Bilateral adrenal glands are normal in appearance. Stomach/Bowel: Visualized portions are unremarkable. Vascular/Lymphatic: No aneurysm identified in the visualized abdominal vasculature. Multiple prominent borderline enlarged lymph nodes are noted in the hepatoduodenal ligament nodal station (axial image 43 of series 22) measuring 9 mm in short axis, and in the portacaval nodal station (axial image 40 of series 22) measuring 1.3 cm in short axis, demonstrating diffusion restriction,  likely malignant. Other: No significant volume of ascites noted in the visualized portions of the peritoneal cavity. Musculoskeletal: No aggressive appearing osseous lesions are noted in the visualized portions of the skeleton. IMPRESSION: 1. Enlarging malignant lesion in the central aspect of the liver which has imaging characteristics most suggestive of intrahepatic cholangiocarcinoma, which is now invasive of the bile ducts extending distally into the common bile duct causing severe biliary obstruction as demonstrated by severe intrahepatic biliary dilatation. There are probable malignant lymph nodes in hepatoduodenal and portacaval nodal stations, as above. 2. Additional incidental findings, as above. Electronically Signed   By: Vinnie Langton M.D.   On: 02/24/2021 09:52   DG ERCP WITH SPHINCTEROTOMY  Result Date: 02/26/2021 CLINICAL DATA:  12-year-old female with history of common bile duct obstruction.  EXAM: ERCP TECHNIQUE: Multiple spot images obtained with the fluoroscopic device and submitted for interpretation post-procedure. FLUOROSCOPY TIME:  Fluoroscopy Time:  12 minutes, 15 seconds Radiation Exposure Index (if provided by the fluoroscopic device): Not provided. Number of Acquired Spot Images: 22 COMPARISON:  MRCP from 02/23/2021 FINDINGS: Endoscope is position of the second portion the duodenum. There is retrograde cannulation of the common bile duct. Limited retrograde cholangiogram was performed with submitted images of limited diagnostic quality with exception of demonstrating a patent ampulla. Choledochoscope is visualized within the common bile duct upon the final image. IMPRESSION: ERCP as above. Please refer to the dedicated procedural/operative note for further procedural details and findings. These images were submitted for radiologic interpretation only. Please see the procedural report for the amount of contrast and the fluoroscopy time utilized. Electronically Signed   By: Ruthann Cancer M.D.   On: 02/26/2021 15:53   DG C-Arm 1-60 Min-No Report  Result Date: 02/26/2021 Fluoroscopy was utilized by the requesting physician.  No radiographic interpretation.   DG C-Arm 1-60 Min-No Report  Result Date: 02/26/2021 Fluoroscopy was utilized by the requesting physician.  No radiographic interpretation.   MR ABDOMEN MRCP W WO CONTAST  Result Date: 02/24/2021 CLINICAL DATA:  87 year old female with history of jaundice. History of colon cancer. Evaluate for choledocholithiasis. EXAM: MRI ABDOMEN WITHOUT AND WITH CONTRAST (INCLUDING MRCP) TECHNIQUE: Multiplanar multisequence MR imaging of the abdomen was performed both before and after the administration of intravenous contrast. Heavily T2-weighted images of the biliary and pancreatic ducts were obtained, and three-dimensional MRCP images were rendered by post processing. CONTRAST:  33m GADAVIST GADOBUTROL 1 MMOL/ML IV SOLN COMPARISON:  Prior  abdominal MRI 12/04/2020. FINDINGS: Lower chest: Unremarkable. Hepatobiliary: Enlarging infiltrative mass in the central aspect of the liver, predominantly in segment 8 and superior aspect of segment 5, best appreciated on precontrast T1 weighted images (series 14) where the bulkiest portion of this lesion is estimated to measure approximately 7.8 x 4.1 cm. This lesion is heterogeneous in signal intensity, but predominantly T1 hypointense and mildly T2 hyperintense, demonstrating diffusion restriction, heterogeneous enhancement on arterial phase post gadolinium images, and persistent enhancement on delayed images. Notably, a large portion of the lesion extends into the intrahepatic biliary tree, extending distally into the common hepatic duct and common bile duct which are both markedly dilated by the abnormal tumor within. This results in severe intrahepatic biliary ductal dilatation throughout all aspects of the liver. Common bile duct scratch the distal common bile duct below the lesion is normal in caliber measuring 6 mm shortly before the ampulla. Pancreas: No pancreatic mass. No pancreatic ductal dilatation. No pancreatic or peripancreatic fluid collections or inflammatory changes. Spleen:  Unremarkable. Adrenals/Urinary Tract: Multiple  T1 hypointense, T2 hyperintense, nonenhancing lesions in both kidneys are compatible with simple cysts, largest of which is exophytic extending off the lower pole of the left kidney measuring up to 6.7 cm in diameter. No aggressive appearing renal lesions are noted. No hydroureteronephrosis in the visualized portions of the abdomen. Bilateral adrenal glands are normal in appearance. Stomach/Bowel: Visualized portions are unremarkable. Vascular/Lymphatic: No aneurysm identified in the visualized abdominal vasculature. Multiple prominent borderline enlarged lymph nodes are noted in the hepatoduodenal ligament nodal station (axial image 43 of series 22) measuring 9 mm in short  axis, and in the portacaval nodal station (axial image 40 of series 22) measuring 1.3 cm in short axis, demonstrating diffusion restriction, likely malignant. Other: No significant volume of ascites noted in the visualized portions of the peritoneal cavity. Musculoskeletal: No aggressive appearing osseous lesions are noted in the visualized portions of the skeleton. IMPRESSION: 1. Enlarging malignant lesion in the central aspect of the liver which has imaging characteristics most suggestive of intrahepatic cholangiocarcinoma, which is now invasive of the bile ducts extending distally into the common bile duct causing severe biliary obstruction as demonstrated by severe intrahepatic biliary dilatation. There are probable malignant lymph nodes in hepatoduodenal and portacaval nodal stations, as above. 2. Additional incidental findings, as above. Electronically Signed   By: Vinnie Langton M.D.   On: 02/24/2021 09:52   MM 3D SCREEN BREAST BILATERAL  Result Date: 02/15/2021 CLINICAL DATA:  Screening. EXAM: DIGITAL SCREENING BILATERAL MAMMOGRAM WITH TOMOSYNTHESIS AND CAD TECHNIQUE: Bilateral screening digital craniocaudal and mediolateral oblique mammograms were obtained. Bilateral screening digital breast tomosynthesis was performed. The images were evaluated with computer-aided detection. COMPARISON:  Previous exam(s). ACR Breast Density Category b: There are scattered areas of fibroglandular density. FINDINGS: There are no findings suspicious for malignancy. IMPRESSION: No mammographic evidence of malignancy. A result letter of this screening mammogram will be mailed directly to the patient. RECOMMENDATION: Screening mammogram in one year. (Code:SM-B-01Y) BI-RADS CATEGORY  1: Negative. Electronically Signed   By: Audie Pinto M.D.   On: 02/15/2021 13:05    Labs:  CBC: Recent Labs    01/01/21 0948 02/25/21 1424 02/26/21 0338 02/27/21 0017  WBC 6.4 6.5 6.5 5.0  HGB 13.7 14.6 12.7 11.7*  HCT 43.0  45.1 39.1 35.4*  PLT 190 337 281 236    COAGS: Recent Labs    09/20/20 1042 02/26/21 0338 02/27/21 0017  INR 1.2 0.9 1.0    BMP: Recent Labs    01/01/21 0948 02/19/21 1553 02/25/21 1424 02/26/21 0338 02/27/21 0017  NA 141 139 136 138 135  K 4.1 4.2 3.5 3.7 4.1  CL 106 102 104 107 105  CO2 '23 25 24 23 22  ' GLUCOSE 91 121* 112* 88 190*  BUN '16 16 21 22 ' 29*  CALCIUM 9.6 10.1 9.5 9.2 8.6*  CREATININE 0.76 0.89 0.60 0.54 0.84  GFRNONAA >60  --  >60 >60 >60    LIVER FUNCTION TESTS: Recent Labs    02/19/21 0744 02/25/21 1424 02/26/21 0338 02/27/21 0017  BILITOT 10.1* 18.4* 18.7* 18.1*  AST 165* 94* 77* 76*  ALT 177* 121* 98* 83*  ALKPHOS 400* 320* 264* 267*  PROT 7.0 6.9 6.3* 5.5*  ALBUMIN 3.9 3.4* 2.8* 2.6*    TUMOR MARKERS: No results for input(s): AFPTM, CEA, CA199, CHROMGRNA in the last 8760 hours.  Assessment and Plan: Patient with history of CBD stone/choledocholithiasis with prior sphincterotomy /stone removal, cholecystectomy, prior colon cancer with surgery and chemotherapy in 2018, anemia/B12 deficiency,  diverticulosis, GERD, nephrolithiasis, hypertension, hyperlipidemia, macular degeneration; presents now with painless jaundice and concern for cholangiocarcinoma on imaging; underwent ERCP yesterday with unsuccessful proximal biliary tree access/stent placement; spyglass cholangioscopy revealed findings likely due to biliary tumor/cholangiocarcinoma in middle and upper duct; biliary brushings performed, pathology pending.  Request now received for PTC with biliary drain placements/possible biliary brushings.  Imaging studies have been reviewed by Dr. Vernard Gambles.  Details/risks of procedure, including but not limited to, internal bleeding, infection, injury to adjacent structures, discussed with patient and daughter with their understanding and consent.  Procedure scheduled for later today.  Patient currently afebrile, BP ok.    Thank you for this interesting  consult.  I greatly enjoyed meeting Ashley Peters and look forward to participating in their care.  A copy of this report was sent to the requesting provider on this date.  Electronically Signed: D. Rowe Robert, PA-C 02/27/2021, 9:17 AM   I spent a total of  30 minutes   in face to face in clinical consultation, greater than 50% of which was counseling/coordinating care for percutaneous transhepatic cholangiogram with biliary drain placements, possible biliary brushings

## 2021-02-27 NOTE — Assessment & Plan Note (Addendum)
-   Consult IR for perc drain -Continue Cipro, Questran - Check CBC and LFT tomorrow

## 2021-02-27 NOTE — Hospital Course (Addendum)
Ashley Peters is a 87 y.o. F with hx colon CA s/p R hemicolectomy 2018, HTN, and recent choledocholithiasis with cholangitis s/p ERCP in Jul 2022 more recently found to have a 5 cm liver lesions, intra and extrahepatic ductal dilation on MRI, hypermetabolic on PET, who presented to GI office with dark urine and painless jaundice and was sent to the ER.  12/19: Admitted, GI consulted 12/20: Underwent ERCP, sphincterotomy; infiltrative mass noted in the main bile duct 12/21: Underwent percutaneous biliary drain placement

## 2021-02-27 NOTE — Assessment & Plan Note (Signed)
-   Outpatient Tumor board next week

## 2021-02-28 ENCOUNTER — Telehealth: Payer: Self-pay

## 2021-02-28 ENCOUNTER — Other Ambulatory Visit: Payer: Self-pay

## 2021-02-28 DIAGNOSIS — D649 Anemia, unspecified: Secondary | ICD-10-CM | POA: Diagnosis not present

## 2021-02-28 DIAGNOSIS — I10 Essential (primary) hypertension: Secondary | ICD-10-CM | POA: Diagnosis not present

## 2021-02-28 DIAGNOSIS — Z85038 Personal history of other malignant neoplasm of large intestine: Secondary | ICD-10-CM

## 2021-02-28 DIAGNOSIS — K831 Obstruction of bile duct: Secondary | ICD-10-CM | POA: Diagnosis not present

## 2021-02-28 DIAGNOSIS — C221 Intrahepatic bile duct carcinoma: Secondary | ICD-10-CM

## 2021-02-28 DIAGNOSIS — R160 Hepatomegaly, not elsewhere classified: Secondary | ICD-10-CM | POA: Diagnosis not present

## 2021-02-28 LAB — CBC
HCT: 34.7 % — ABNORMAL LOW (ref 36.0–46.0)
Hemoglobin: 11.3 g/dL — ABNORMAL LOW (ref 12.0–15.0)
MCH: 28.1 pg (ref 26.0–34.0)
MCHC: 32.6 g/dL (ref 30.0–36.0)
MCV: 86.3 fL (ref 80.0–100.0)
Platelets: 230 10*3/uL (ref 150–400)
RBC: 4.02 MIL/uL (ref 3.87–5.11)
RDW: 17.3 % — ABNORMAL HIGH (ref 11.5–15.5)
WBC: 7.9 10*3/uL (ref 4.0–10.5)
nRBC: 0 % (ref 0.0–0.2)

## 2021-02-28 LAB — COMPREHENSIVE METABOLIC PANEL
ALT: 68 U/L — ABNORMAL HIGH (ref 0–44)
AST: 57 U/L — ABNORMAL HIGH (ref 15–41)
Albumin: 2.6 g/dL — ABNORMAL LOW (ref 3.5–5.0)
Alkaline Phosphatase: 237 U/L — ABNORMAL HIGH (ref 38–126)
Anion gap: 8 (ref 5–15)
BUN: 26 mg/dL — ABNORMAL HIGH (ref 8–23)
CO2: 21 mmol/L — ABNORMAL LOW (ref 22–32)
Calcium: 8.7 mg/dL — ABNORMAL LOW (ref 8.9–10.3)
Chloride: 108 mmol/L (ref 98–111)
Creatinine, Ser: 0.86 mg/dL (ref 0.44–1.00)
GFR, Estimated: >60 mL/min (ref >60)
Glucose, Bld: 110 mg/dL — ABNORMAL HIGH (ref 70–99)
Potassium: 4 mmol/L (ref 3.5–5.1)
Sodium: 137 mmol/L (ref 135–145)
Total Bilirubin: 10.8 mg/dL — ABNORMAL HIGH (ref 0.3–1.2)
Total Protein: 5.6 g/dL — ABNORMAL LOW (ref 6.5–8.1)

## 2021-02-28 MED ORDER — OXYCODONE HCL 5 MG PO TABS: 2.5000 mg | ORAL_TABLET | Freq: Four times a day (QID) | ORAL | 0 refills | Status: DC | PRN

## 2021-02-28 MED ORDER — OXYCODONE HCL 5 MG PO TABS: 2.5000 mg | ORAL_TABLET | Freq: Four times a day (QID) | ORAL | Status: DC | PRN

## 2021-02-28 MED ORDER — CIPROFLOXACIN HCL 500 MG PO TABS: 500.0000 mg | ORAL_TABLET | Freq: Two times a day (BID) | ORAL | 0 refills | Status: AC

## 2021-02-28 MED ORDER — ACETAMINOPHEN 325 MG PO TABS
650.0000 mg | ORAL_TABLET | Freq: Four times a day (QID) | ORAL | Status: DC | PRN
  Administered 2021-02-28 – 2021-03-01 (×3): 650 mg via ORAL
  Filled 2021-02-28 (×3): qty 2

## 2021-02-28 NOTE — Progress Notes (Unsigned)
Orders placed for CMP per GM. Left voicemail for pt to call back.

## 2021-02-28 NOTE — Care Management Important Message (Signed)
Important Message  Patient Details IM Letter placed in Patients room. Name: Ashley Peters MRN: [id number] Date of Birth: [birth year]   Medicare Important Message Given:  Yes     Kerin Salen 02/28/2021, 11:13 AM

## 2021-02-28 NOTE — Telephone Encounter (Signed)
-----   Message from Irving Copas., MD sent at 02/28/2021  4:13 PM EST ----- Sounds good.  Patty or Freight forwarder, this patient needs repeat CMP in 1-week. This can be under Dr. Carlean Purl or my name. Thanks. GM ----- Message ----- From: Truitt Merle, MD Sent: 02/28/2021   2:36 PM EST To: Irving Copas., MD, Royston Bake, RN  Please add her to GI conference next week, newly diagnosed cholangiocarcinoma, previous history of colon cancer, full discussion   Thx  YF

## 2021-02-28 NOTE — Progress Notes (Signed)
Ashley Peters   DOB:Jan 15, 1929   GY#:[id number]   WYO#:[id number]  Oncology follow up note   Subjective: Ashley Peters is feeling better, the PTC output was high last night, last this morning.  She was sitting in the chair when I saw her this morning.  She feels better.  Mild pain at the [facility] site, no other complaints.   Objective:  Vitals:   02/28/21 0547 02/28/21 0553  BP: (!) 170/71 (!) 167/75  Pulse: 70 71  Resp: 16   Temp: 98.4 F (36.9 C)   SpO2: 96%     Body mass index is 22.17 kg/m.  Intake/Output Summary (Last 24 hours) at 02/28/2021 0825 Last data filed at 02/28/2021 0600 Gross per 24 hour  Intake 530 ml  Output 285 ml  Net 245 ml     Sclerae icteric  Oropharynx clear  No peripheral adenopathy  Lungs clear -- no rales or rhonchi  Heart regular rate and rhythm  Abdomen benign   CBG (last 3)  No results for input(s): GLUCAP in the last 72 hours.   Labs:   Urine Studies No results for input(s): UHGB, CRYS in the last 72 hours.  Invalid input(s): UACOL, UAPR, USPG, UPH, UTP, UGL, UKET, UBIL, UNIT, UROB, [place], UEPI, UWBC, Duwayne Heck [place], [place]  Basic Metabolic Panel: Recent Labs  Lab 02/25/21 1424 02/26/21 0338 02/27/21 0017 02/28/21 0321  NA 136 138 135 137  K 3.5 3.7 4.1 4.0  CL 104 107 105 108  CO2 '24 23 22 ' 21*  GLUCOSE 112* 88 190* 110*  BUN 21 22 29* 26*  CREATININE 0.60 0.54 0.84 0.86  CALCIUM 9.5 9.2 8.6* 8.7*   GFR Estimated Creatinine Clearance: 31.2 mL/min (by C-G formula based on SCr of 0.86 mg/dL). Liver Function Tests: Recent Labs  Lab 02/25/21 1424 02/26/21 0338 02/27/21 0017 02/28/21 0321  AST 94* 77* 76* 57*  ALT 121* 98* 83* 68*  ALKPHOS 320* 264* 267* 237*  BILITOT 18.4* 18.7* 18.1* 10.8*  PROT 6.9 6.3* 5.5* 5.6*  ALBUMIN 3.4* 2.8* 2.6* 2.6*   No results for input(s): LIPASE, AMYLASE in the last 168 hours. No results for input(s): AMMONIA in the last 168 hours. Coagulation profile Recent Labs  Lab 02/26/21 0338  02/27/21 0017  INR 0.9 1.0    CBC: Recent Labs  Lab 02/25/21 1424 02/26/21 0338 02/27/21 0017 02/28/21 0321  WBC 6.5 6.5 5.0 7.9  NEUTROABS 5.4  --   --   --   HGB 14.6 12.7 11.7* 11.3*  HCT 45.1 39.1 35.4* 34.7*  MCV 87.1 87.7 85.5 86.3  PLT 337 281 236 230   Cardiac Enzymes: No results for input(s): CKTOTAL, CKMB, CKMBINDEX, TROPONINI in the last 168 hours. BNP: Invalid input(s): POCBNP CBG: No results for input(s): GLUCAP in the last 168 hours. D-Dimer No results for input(s): DDIMER in the last 72 hours. Hgb A1c No results for input(s): HGBA1C in the last 72 hours. Lipid Profile No results for input(s): CHOL, HDL, LDLCALC, TRIG, CHOLHDL, LDLDIRECT in the last 72 hours. Thyroid function studies No results for input(s): TSH, T4TOTAL, T3FREE, THYROIDAB in the last 72 hours.  Invalid input(s): FREET3 Anemia work up No results for input(s): VITAMINB12, FOLATE, FERRITIN, TIBC, IRON, RETICCTPCT in the last 72 hours. Microbiology Recent Results (from the past 240 hour(s))  Resp Panel by RT-PCR (Flu A&B, Covid) Nasopharyngeal Swab     Status: None   Collection Time: 02/25/21  2:25 PM   Specimen: Nasopharyngeal Swab; Nasopharyngeal(NP) swabs in  vial transport medium  Result Value Ref Range Status   SARS Coronavirus 2 by RT PCR NEGATIVE NEGATIVE Final    Comment: (NOTE) SARS-CoV-2 target nucleic acids are NOT DETECTED.  The SARS-CoV-2 RNA is generally detectable in upper respiratory specimens during the acute phase of infection. The lowest concentration of SARS-CoV-2 viral copies this assay can detect is 138 copies/mL. A negative result does not preclude SARS-Cov-2 infection and should not be used as the sole basis for treatment or other patient management decisions. A negative result may occur with  improper specimen collection/handling, submission of specimen other than nasopharyngeal swab, presence of viral mutation(s) within the areas targeted by this assay, and  inadequate number of viral copies(<138 copies/mL). A negative result must be combined with clinical observations, patient history, and epidemiological information. The expected result is Negative.  Fact Sheet for Patients:  EntrepreneurPulse.com.au  Fact Sheet for Healthcare Providers:  IncredibleEmployment.be  This test is no t yet approved or cleared by the [place] FDA and  has been authorized for detection and/or diagnosis of SARS-CoV-2 by FDA under an Emergency Use Authorization (EUA). This EUA will remain  in effect (meaning this test can be used) for the duration of the COVID-19 declaration under Section 564(b)(1) of the Act, 21 U.S.C.section 360bbb-3(b)(1), unless the authorization is terminated  or revoked sooner.       Influenza A by PCR NEGATIVE NEGATIVE Final   Influenza B by PCR NEGATIVE NEGATIVE Final    Comment: (NOTE) The Xpert Xpress SARS-CoV-2/FLU/RSV plus assay is intended as an aid in the diagnosis of influenza from Nasopharyngeal swab specimens and should not be used as a sole basis for treatment. Nasal washings and aspirates are unacceptable for Xpert Xpress SARS-CoV-2/FLU/RSV testing.  Fact Sheet for Patients: EntrepreneurPulse.com.au  Fact Sheet for Healthcare Providers: IncredibleEmployment.be  This test is not yet approved or cleared by the [place] FDA and has been authorized for detection and/or diagnosis of SARS-CoV-2 by FDA under an Emergency Use Authorization (EUA). This EUA will remain in effect (meaning this test can be used) for the duration of the COVID-19 declaration under Section 564(b)(1) of the Act, 21 U.S.C. section 360bbb-3(b)(1), unless the authorization is terminated or revoked.  Performed at [facility] [place] [address] [place], [place] [address]       Studies:  DG ERCP WITH SPHINCTEROTOMY  Result Date:  02/26/2021 CLINICAL DATA:  87-year-old female with history of common bile duct obstruction. EXAM: ERCP TECHNIQUE: Multiple spot images obtained with the fluoroscopic device and submitted for interpretation post-procedure. FLUOROSCOPY TIME:  Fluoroscopy Time:  12 minutes, 15 seconds Radiation Exposure Index (if provided by the fluoroscopic device): Not provided. Number of Acquired Spot Images: 22 COMPARISON:  MRCP from 02/23/2021 FINDINGS: Endoscope is position of the second portion the duodenum. There is retrograde cannulation of the common bile duct. Limited retrograde cholangiogram was performed with submitted images of limited diagnostic quality with exception of demonstrating a patent ampulla. Choledochoscope is visualized within the common bile duct upon the final image. IMPRESSION: ERCP as above. Please refer to the dedicated procedural/operative note for further procedural details and findings. These images were submitted for radiologic interpretation only. Please see the procedural report for the amount of contrast and the fluoroscopy time utilized. Electronically Signed   By: Ruthann Cancer M.D.   On: 02/26/2021 15:53   DG C-Arm 1-60 Min-No Report  Result Date: 02/26/2021 Fluoroscopy was utilized by the requesting physician.  No radiographic interpretation.   DG C-Arm  1-60 Min-No Report  Result Date: 02/26/2021 Fluoroscopy was utilized by the requesting physician.  No radiographic interpretation.   IR PERCUTANEOUS TRANSHEPATIC CHOLANGIOGRAM  Result Date: 02/28/2021 INDICATION: Mass near the biliary confluence, with biliary ductal dilatation and jaundice. Endoscopy could not achieve adequate biliary drainage. EXAM: PERCUTANEOUS TRANSHEPATIC CHOLANGIOGRAM PERCUTANEOUS INTERNAL/EXTERNAL BILIARY DRAINAGE CATHETER PLACEMENT MEDICATIONS: levaquin 500 MG IV; The antibiotic was administered within an appropriate time frame prior to the initiation of the procedure. ANESTHESIA/SEDATION: Intravenous  Fentanyl 49mg and Versed 149mwere administered as conscious sedation during continuous monitoring of the patient's level of consciousness and physiological / cardiorespiratory status by the radiology RN, with a total moderate sedation time of 23 minutes. FLUOROSCOPY TIME:  4 minute 36 seconds; 18 mGy COMPLICATIONS: None immediate. PROCEDURE: Informed written consent was obtained from the patient after a thorough discussion of the procedural risks, benefits and alternatives. All questions were addressed. Maximal Sterile Barrier Technique was utilized including caps, mask, sterile gowns, sterile gloves, sterile drape, hand hygiene and skin antiseptic. A timeout was performed prior to the initiation of the procedure. Right upper quadrant prepped and draped in usual sterile fashion. A dilated peripheral bile duct in the inferior right lobe was localized under ultrasound and approach with a 21 gauge micropuncture needle. Bowel returned through the needle hub. 018 guidewire advanced easily and centrally. Tract dilated with a transitional dilator. Contrast injection was performed. Cholangiogram demonstrated massively dilated right intrahepatic biliary tree. No significant opacification of the peripheral left ducts. There is pruning of the biliary confluence. Intraluminal filling defect in the proximal CBD which is dilated. The distal CBD tapers to near normal caliber. An angled 035 roadrunner wire was advanced into the duodenum. Tract was dilated to facilitate placement of 10.2 French multi sidehole internal/external biliary drainage catheter, spanning the level of obstruction of the CBD. Catheter injection confirmed good position and patency. There is excellent bilious return through the hub of the catheter. Catheter was secured externally with 0 Prolene suture and StatLock and placed to gravity drain bag. The patient tolerated the procedure well. IMPRESSION: 1. Obstructing proximal CBD lesion with intrahepatic biliary  ductal dilatation. 2. Technically successful internal/external right biliary drain catheter placement. Electronically Signed   By: D Lucrezia Europe.D.   On: 02/28/2021 07:48    Assessment: [age].o. female   Newly diagnosed cholangiocarcinoma Obstructive jaundice, status post PTC History of CBD stone, cholecystitis, abnormal liver lesion on recent MRI/PET, s/p ERCP and multiple stone removal History of stage II right colon cancer, status post surgery and adjuvant chemo in 2018 Anemia and B12 deficiency   Plan:  -I reviewed her recent the cytology from the bile duct brushing, which unfortunately showed carcinoma.  This is consistent with cholangiocarcinoma.  -I plan to present her case in our GI conference next week, to see if her cancer is resectable. Due to her advanced age, and central location of the tumor, it would be challenging for resection. -Discharge planning per primary team, I encouraged her to drink more due [facility] of [facility] -f/u with me in clinic next week.   YaTruitt MerleMD 02/28/2021

## 2021-02-28 NOTE — Progress Notes (Signed)
Referring Physician(s): Dr. Rush Landmark  Supervising Physician: Ruthann Cancer  Patient Status:  Ashley Peters - In-pt  Chief Complaint: Biliary obstruction secondary to malignant lesion;  S/p internal/external biliary drain placement 02/27/21 by Dr. Vernard Gambles  Subjective: Patient sitting up in the chair, her daughter is at the bedside. She is feeling better.   Allergies: Benicar [olmesartan medoxomil] and Amoxicillin  Medications: Prior to Admission medications   Medication Sig Start Date End Date Taking? Authorizing Provider  acetaminophen (TYLENOL) 500 MG tablet Take 2 tablets (1,000 mg total) by mouth every 6 (six) hours as needed for moderate pain. Patient taking differently: Take 500-1,000 mg by mouth every 6 (six) hours as needed for moderate pain. 10/17/16  Yes Donnie Mesa, MD  alendronate (FOSAMAX) 70 MG tablet Take with a full glass of water on an empty stomach. Patient taking differently: Take 70 mg by mouth every Monday. Take with a full glass of water on an empty stomach. 02/22/20  Yes Dettinger, [name], MD  Cyanocobalamin 1000 MCG/ML KIT Inject 1,000 mcg as directed every 30 (thirty) days.   Yes [provider]  Multiple Vitamins-Minerals (PRESERVISION AREDS 2 PO) Take 1 capsule by mouth 2 (two) times daily.   Yes [provider]     Vital Signs: BP (!) 167/75 (BP Location: Left Arm)    Pulse 71    Temp 98.4 F (36.9 C) (Oral)    Resp 16    Ht 5' (1.524 m)    Wt 113 lb 8.6 oz (51.5 kg)    SpO2 96%    BMI 22.17 kg/m   Physical Exam Constitutional:      General: She is not in acute distress. Abdominal:     Palpations: Abdomen is soft.     Tenderness: There is abdominal tenderness.     Comments: RUQ drain to gravity. Serosanguineous output. Dressing is clean/dry. Suture and stat-lock in place.   Skin:    General: Skin is warm and dry.     Coloration: Skin is jaundiced.  Neurological:     Mental Status: She is alert and oriented to person, place, and  time.    Imaging: DG ERCP WITH SPHINCTEROTOMY  Result Date: 02/26/2021 CLINICAL DATA:  19-year-old female with history of common bile duct obstruction. EXAM: ERCP TECHNIQUE: Multiple spot images obtained with the fluoroscopic device and submitted for interpretation post-procedure. FLUOROSCOPY TIME:  Fluoroscopy Time:  12 minutes, 15 seconds Radiation Exposure Index (if provided by the fluoroscopic device): Not provided. Number of Acquired Spot Images: 22 COMPARISON:  MRCP from 02/23/2021 FINDINGS: Endoscope is position of the second portion the duodenum. There is retrograde cannulation of the common bile duct. Limited retrograde cholangiogram was performed with submitted images of limited diagnostic quality with exception of demonstrating a patent ampulla. Choledochoscope is visualized within the common bile duct upon the final image. IMPRESSION: ERCP as above. Please refer to the dedicated procedural/operative note for further procedural details and findings. These images were submitted for radiologic interpretation only. Please see the procedural report for the amount of contrast and the fluoroscopy time utilized. Electronically Signed   By: Ruthann Cancer M.D.   On: 02/26/2021 15:53   DG C-Arm 1-60 Min-No Report  Result Date: 02/26/2021 Fluoroscopy was utilized by the requesting physician.  No radiographic interpretation.   DG C-Arm 1-60 Min-No Report  Result Date: 02/26/2021 Fluoroscopy was utilized by the requesting physician.  No radiographic interpretation.   IR PERCUTANEOUS TRANSHEPATIC CHOLANGIOGRAM  Result Date: 02/28/2021 INDICATION: Mass near the  biliary confluence, with biliary ductal dilatation and jaundice. Endoscopy could not achieve adequate biliary drainage. EXAM: PERCUTANEOUS TRANSHEPATIC CHOLANGIOGRAM PERCUTANEOUS INTERNAL/EXTERNAL BILIARY DRAINAGE CATHETER PLACEMENT MEDICATIONS: levaquin 500 MG IV; The antibiotic was administered within an appropriate time frame prior to the  initiation of the procedure. ANESTHESIA/SEDATION: Intravenous Fentanyl 12mg and Versed 186mwere administered as conscious sedation during continuous monitoring of the patient's level of consciousness and physiological / cardiorespiratory status by the radiology RN, with a total moderate sedation time of 23 minutes. FLUOROSCOPY TIME:  4 minute 36 seconds; 18 mGy COMPLICATIONS: None immediate. PROCEDURE: Informed written consent was obtained from the patient after a thorough discussion of the procedural risks, benefits and alternatives. All questions were addressed. Maximal Sterile Barrier Technique was utilized including caps, mask, sterile gowns, sterile gloves, sterile drape, hand hygiene and skin antiseptic. A timeout was performed prior to the initiation of the procedure. Right upper quadrant prepped and draped in usual sterile fashion. A dilated peripheral bile duct in the inferior right lobe was localized under ultrasound and approach with a 21 gauge micropuncture needle. Bowel returned through the needle hub. 018 guidewire advanced easily and centrally. Tract dilated with a transitional dilator. Contrast injection was performed. Cholangiogram demonstrated massively dilated right intrahepatic biliary tree. No significant opacification of the peripheral left ducts. There is pruning of the biliary confluence. Intraluminal filling defect in the proximal CBD which is dilated. The distal CBD tapers to near normal caliber. An angled 035 roadrunner wire was advanced into the duodenum. Tract was dilated to facilitate placement of 10.2 French multi sidehole internal/external biliary drainage catheter, spanning the level of obstruction of the CBD. Catheter injection confirmed good position and patency. There is excellent bilious return through the hub of the catheter. Catheter was secured externally with 0 Prolene suture and StatLock and placed to gravity drain bag. The patient tolerated the procedure well. IMPRESSION:  1. Obstructing proximal CBD lesion with intrahepatic biliary ductal dilatation. 2. Technically successful internal/external right biliary drain catheter placement. Electronically Signed   By: D Lucrezia Europe.D.   On: 02/28/2021 07:48    Labs:  CBC: Recent Labs    02/25/21 1424 02/26/21 0338 02/27/21 0017 02/28/21 0321  WBC 6.5 6.5 5.0 7.9  HGB 14.6 12.7 11.7* 11.3*  HCT 45.1 39.1 35.4* 34.7*  PLT 337 281 236 230    COAGS: Recent Labs    09/20/20 1042 02/26/21 0338 02/27/21 0017  INR 1.2 0.9 1.0    BMP: Recent Labs    02/25/21 1424 02/26/21 0338 02/27/21 0017 02/28/21 0321  NA 136 138 135 137  K 3.5 3.7 4.1 4.0  CL 104 107 105 108  CO2 _0 21*  GLUCOSE 112* 88 190* 110*  BUN 21 22 29* 26*  CALCIUM 9.5 9.2 8.6* 8.7*  CREATININE 0.60 0.54 0.84 0.86  GFRNONAA >60 >60 >60 >60    LIVER FUNCTION TESTS: Recent Labs    02/25/21 1424 02/26/21 0338 02/27/21 0017 02/28/21 0321  BILITOT 18.4* 18.7* 18.1* 10.8*  AST 94* 77* 76* 57*  ALT 121* 98* 83* 68*  ALKPHOS 320* 264* 267* 237*  PROT 6.9 6.3* 5.5* 5.6*  ALBUMIN 3.4* 2.8* 2.6* 2.6*    Assessment and Plan:  Biliary obstruction secondary to malignant lesion;  S/p internal/external biliary drain placement 02/27/21 by Dr. HaVernard GamblesT. Bili decreased to 10.8 (18.1 yesterday). She is afebrile.   Drain Location: RUQ Size: Fr size: 10 Fr Date of placement: 02/27/21  Currently to: Drain collection device: gravity 24  hour output:  Output by Drain (mL) 02/26/21 0701 - 02/26/21 1900 02/26/21 1901 - 02/27/21 0700 02/27/21 0701 - 02/27/21 1900 02/27/21 1901 - 02/28/21 0700 02/28/21 0701 - 02/28/21 1444  Biliary Tube Cook slip-coat 10.2 Fr. RUQ    285 125    Interval imaging/drain manipulation:  Capped.   Current examination: Insertion site unremarkable. Suture and stat lock in place. Dressed appropriately.   Plan: Patient with pending discharge plans. Drain capped and an extra gravity bag was given to the  patient's daughter. IR will see the patient in 4-6 weeks for outpatient follow up and a scheduler from our office will call with a date/time. Instructions/drain care were discussed: Keep the site clean and dry, ok to shower as long as site is covered with an occlusive dressing. If the patient develops abdominal pain or leaking at the skin site they are to reconnect the catheter to the gravity bag and call IR.   Electronically Signed: Soyla Dryer, AGACNP-BC [phone] 02/28/2021, 2:40 PM   I spent a total of 15 Minutes at the the patient's bedside AND on the patient's hospital floor or unit, greater than 50% of which was counseling/coordinating care for biliary drain

## 2021-02-28 NOTE — Evaluation (Addendum)
Physical Therapy Evaluation Patient Details Name: Ashley Peters MRN: [id number] DOB: [birth year] Today's Date: 02/28/2021  History of Present Illness  Pt is a 87 y.o. Female  who presented to GI office with dark urine and painless jaundice and was sent to the ER. PMH significnat for hx of colon CA s/p R hemicolectomy 2018, HTN, and recent choledocholithiasis with cholangitis s/p ERCP in Jul 2022, and osteopenia.   Clinical Impression  Pt is a 87 y.o. female with above HPI resulting in the deficits listed below (see PT Problem List). Pt performed all mobility with supervision, ambulating total of ~113ft without use of assistive device. PT educated pt, daughter, and nursing staff on importance of continued mobility multiple times per day to decrease effects of hospital stay, updated on mobility status. Pt will be staying with daughter who can provide 24hr supervision upon d/c during recovery. Pt is currently at supervision level for all mobility. No further skilled PT needs identified, pt and daughter in agreement. PT will sign off. Please reconsult if mobility status changes.         Recommendations for follow up therapy are one component of a multi-disciplinary discharge planning process, led by the attending physician.  Recommendations may be updated based on patient status, additional functional criteria and insurance authorization.  Follow Up Recommendations No PT follow up    Assistance Recommended at Discharge Frequent or constant Supervision/Assistance  Functional Status Assessment Patient has had a recent decline in their functional status and demonstrates the ability to make significant improvements in function in a reasonable and predictable amount of time.  Equipment Recommendations  None recommended by PT    Recommendations for Other Services       Precautions / Restrictions Precautions Precautions: Fall Restrictions Weight Bearing Restrictions: No      Mobility  Bed  Mobility Overal bed mobility: Modified Independent             General bed mobility comments: incr time, HOB elevated    Transfers Overall transfer level: Needs assistance Equipment used: None Transfers: Sit to/from Stand Sit to Stand: Supervision           General transfer comment: supervision for safety    Ambulation/Gait Ambulation/Gait assistance: Min guard;Supervision Gait Distance (Feet): 180 Feet Assistive device: None Gait Pattern/deviations: Step-through pattern;Decreased stride length Gait velocity: decreased     General Gait Details: no overt LOB observed. Decreased gait speed, but steady overall, very mild drifting L/R with gait path noted. MIN guard progressing to supervision for safety.  Stairs            Wheelchair Mobility    Modified Rankin (Stroke Patients Only)       Balance Overall balance assessment: Mild deficits observed, not formally tested                                           Pertinent Vitals/Pain Pain Assessment: 0-10 Pain Score: 6  Pain Location: Lower Rt abdomen where drain placed Pain Descriptors / Indicators: Sore Pain Intervention(s): Limited activity within patient's tolerance;Monitored during session;Repositioned    Home Living Family/patient expects to be discharged to:: Private residence Living Arrangements: Alone Available Help at Discharge: Family Type of Home: House Home Access: Stairs to enter   Technical brewer of Steps: 1   Home Layout: One level Home Equipment: Conservation officer, nature (2 wheels);Rollator (4 wheels);Wheelchair - manual;Cane - quad;Shower  seat;Grab bars - tub/shower;Grab bars - toilet Additional Comments: No AD at baseline. will be staying with dauhghter upon d/c, daughter works from home (home setup is daughters house). Pt lives in 1 level ranch with ramp.    Prior Function Prior Level of Function : Independent/Modified Independent;Driving             Mobility  Comments: denies fall in past year.       Hand Dominance        Extremity/Trunk Assessment   Upper Extremity Assessment Upper Extremity Assessment: Overall WFL for tasks assessed    Lower Extremity Assessment Lower Extremity Assessment: RLE deficits/detail;LLE deficits/detail RLE Deficits / Details: 4/5 throughout LLE Deficits / Details: 4/5 throughout    Cervical / Trunk Assessment Cervical / Trunk Assessment: Normal  Communication   Communication: No difficulties  Cognition Arousal/Alertness: Awake/alert Behavior During Therapy: WFL for tasks assessed/performed Overall Cognitive Status: Within Functional Limits for tasks assessed                                          General Comments      Exercises     Assessment/Plan    PT Assessment Patient needs continued PT services  PT Problem List Decreased strength;Decreased activity tolerance;Decreased balance;Decreased mobility       PT Treatment Interventions Gait training;Stair training;Functional mobility training;Therapeutic activities;Therapeutic exercise;Balance training;Patient/family education    PT Goals (Current goals can be found in the Care Plan section)  Acute Rehab PT Goals Patient Stated Goal: Go home as soon as able, keep up and moving PT Goal Formulation: With patient/family Time For Goal Achievement: 03/14/21 Potential to Achieve Goals: Good    Frequency Min 3X/week   Barriers to discharge        Co-evaluation               AM-PAC PT "6 Clicks" Mobility  Outcome Measure Help needed turning from your back to your side while in a flat bed without using bedrails?: None Help needed moving from lying on your back to sitting on the side of a flat bed without using bedrails?: None Help needed moving to and from a bed to a chair (including a wheelchair)?: A Little Help needed standing up from a chair using your arms (e.g., wheelchair or bedside chair)?: A Little Help  needed to walk in hospital room?: A Little Help needed climbing 3-5 steps with a railing? : A Little 6 Click Score: 20    End of Session Equipment Utilized During Treatment: Gait belt Activity Tolerance: Patient tolerated treatment well Patient left: in chair;with call bell/phone within reach;with family/visitor present Nurse Communication: Mobility status PT Visit Diagnosis: Unsteadiness on feet (R26.81)    Time: 0210-0230 PT Time Calculation (min) (ACUTE ONLY): 20 min   Charges:   PT Evaluation $PT Eval Low Complexity: 1 Low          Festus Barren PT, DPT  Acute Rehabilitation Services  Office [phone]  02/28/2021, 3:17 PM

## 2021-02-28 NOTE — Progress Notes (Signed)
°  Progress Note   Patient: Ashley Peters ATF:[id number] DOB: [birth year] DOA: 02/25/2021     3 DOS: the patient was seen and examined on 02/28/2021   Brief hospital course: Ashley Peters is a 87 y.o. F with hx colon CA s/p R hemicolectomy 2018, HTN, and recent choledocholithiasis with cholangitis s/p ERCP in Jul 2022 more recently found to have a 5 cm liver lesions, intra and extrahepatic ductal dilation on MRI, hypermetabolic on PET, who presented to GI office with dark urine and painless jaundice and was sent to the ER.  12/19: Admitted, GI consulted 12/20: Underwent ERCP, sphincterotomy; infiltrative mass noted in the main bile duct 12/21: Underwent percutaneous biliary drain placement   Assessment and Plan Common bile duct (CBD) obstruction - Consult IR for perc drain -Continue Cipro, Questran - Check CBC and LFT tomorrow  HTN (hypertension)- (present on admission) Not on meds at home -Continue PRN hydralazine     Subjective: Patient has some soreness at the site of her drain.  No fever, confusion, vomiting.  Appetite is good.  No improved worsening in jaundice.  Objective Vital signs were reviewed and unremarkable. General appearance: Elderly adult female, lying in recliner, no acute distress     HEENT: Anicteric, conjunctival pink, lids and lashes normal. Skin: Moderately jaundiced. Cardiac: RRR, no murmurs, no lower extremity edema Respiratory: Normal respiratory rate and rhythm, clear without rales or wheezes. Abdomen: Mild tenderness at the site of her PERC drain, but this is improved from yesterday.  No rigidity or rebound. MSK:  Neuro: Alert, extraocular movements intact, moves upper extremities with generalized weakness but symmetric strength. Psych: Attention normal, affect normal, judgment Syprine normal   Data Reviewed: LFT stable, Total bilirubin trending down, white blood cell count normal.  Family Communication: Daughter at the bedside  Disposition: Status  is: Inpatient  .  Percutaneous drain placed yesterday.  She has had no fever or increasing white count, but given her age, frailty, and the risk for decompensation, will monitor overnight, likely discharge home tomorrow if no fever            Author: Edwin Dada 02/28/2021 6:00 PM  For on call review [email or web address].

## 2021-03-01 ENCOUNTER — Other Ambulatory Visit: Payer: Self-pay | Admitting: Family Medicine

## 2021-03-01 ENCOUNTER — Telehealth: Payer: Self-pay | Admitting: Hematology

## 2021-03-01 ENCOUNTER — Telehealth: Payer: Self-pay | Admitting: Gastroenterology

## 2021-03-01 ENCOUNTER — Ambulatory Visit: Payer: Medicare Other | Admitting: Family Medicine

## 2021-03-01 DIAGNOSIS — C221 Intrahepatic bile duct carcinoma: Secondary | ICD-10-CM | POA: Diagnosis not present

## 2021-03-01 DIAGNOSIS — I10 Essential (primary) hypertension: Secondary | ICD-10-CM | POA: Diagnosis not present

## 2021-03-01 DIAGNOSIS — K831 Obstruction of bile duct: Secondary | ICD-10-CM | POA: Diagnosis not present

## 2021-03-01 NOTE — Telephone Encounter (Signed)
Second attempt, left voicemail for pt to call back.

## 2021-03-01 NOTE — Telephone Encounter (Signed)
Patient's daughter called this morning returning your phone call.  Patient has been in the hospital.  If you could please call her on her cell:  [phone].  Thank you.

## 2021-03-01 NOTE — Telephone Encounter (Signed)
Scheduled per sch msg. Called and left msg  

## 2021-03-01 NOTE — Telephone Encounter (Signed)
Spoke with pt's daughter who stated that her mother had been in the hospital this week, but was suppose to discharge today. She is aware that pt will need to follow-up with lab work at our office next week. No further questions.

## 2021-03-01 NOTE — Progress Notes (Signed)
Patient discharged to home with family. Given all belongings, instructions, equipment. Sent w/ dressing supplies and drain bag. Patient and daughter verbalized understanding of instructions. Escorted to pov via w/c.

## 2021-03-01 NOTE — Discharge Summary (Signed)
Physician Discharge Summary   Patient: Ashley Peters MRN: [id number] DOB: '@DOB'   Admit date:     02/25/2021  Discharge date: 03/01/21  Discharge Physician: Edwin Dada   PCP: Dettinger, [name], MD   Recommendations at discharge: 1. Follow up with Dr. Burr Medico, Oncology, in 1-2 weeks, Tumor board to discuss this Thursday 2. Follow up with Interventional Radiology Drain clinic 3. Follow up with [facility], Dr. Carlean Purl or [place]        Discharge Diagnoses Principal Problem:   Intrahepatic cholangiocarcinoma [facility]) Active Problems:   Common bile duct (CBD) obstruction   HTN (hypertension)     Hospital Course   Ashley Peters is a 87 y.o. F with hx colon CA s/p R hemicolectomy 2018, HTN, and recent choledocholithiasis with cholangitis s/p ERCP in Jul 2022 more recently found to have a 5 cm liver lesions, intra and extrahepatic ductal dilation on MRI, hypermetabolic on PET, who presented to GI office with dark urine and painless jaundice and was sent to the ER.  Patient was admitted and GI were consulted.  She underwent ERCP by Dr. Rush Landmark on 12/20, with sphincterotomy.  An infiltrative mass was noted in the main bile duct, brushings were collected.  Stent was not possible to be placed technically.  Post ERCP, the patient's total bilirubin remained above 20, interventional radiology placed percutaneous biliary drain under image guidance, after which she had no fever, transaminitis, or leukocytosis.  Her total bilirubin dropped and her jaundice improved.  She was afebrile and her symptoms were controlled, able to tolerate an oral diet.  Drain teaching was performed, patient was discharged in good condition to her family with close follow-up with GI and oncology.  Brushings from her common bile duct were consistent with cholangiocarcinoma.  It is unclear if she is an operative candidate, although at her age and given the extent of surgery the risks may outweigh the benefits.   Tumor board will discuss this Thursday, she may be a consultative date for a CBD stent and removal of her PERC drain, although this is also unclear at this time.               Pain control - [employer] Controlled Substance Reporting System database was reviewed.    Consultants: GI, IR Procedures performed: ERCP, percutaneous biliary drain placement  Disposition: Home Diet recommendation: Regular diet  DISCHARGE MEDICATION: Allergies as of 03/01/2021       Reactions   Benicar [olmesartan Medoxomil] Other (See Comments)   Elevated Potassium   Amoxicillin Rash   Has patient had a PCN reaction causing immediate rash, facial/tongue/throat swelling, SOB or lightheadedness with hypotension: Yes Has patient had a PCN reaction causing severe rash involving mucus membranes or skin necrosis: No Has patient had a PCN reaction that required hospitalization: No Has patient had a PCN reaction occurring within the last 85 years: No If all of the above answers are "NO", then may proceed with Cephalosporin use.        Medication List     TAKE these medications    acetaminophen 500 MG tablet Commonly known as: TYLENOL Take 2 tablets (1,000 mg total) by mouth every 6 (six) hours as needed for moderate pain. What changed: how much to take   alendronate 70 MG tablet Commonly known as: FOSAMAX Take with a full glass of water on an empty stomach. What changed:  how much to take how to take this when to take this   ciprofloxacin 500 MG  tablet Commonly known as: CIPRO Take 1 tablet (500 mg total) by mouth 2 (two) times daily for 2 days.   Cyanocobalamin 1000 MCG/ML Kit Inject 1,000 mcg as directed every 30 (thirty) days.   oxyCODONE 5 MG immediate release tablet Commonly known as: Oxy IR/ROXICODONE Take 0.5 tablets (2.5 mg total) by mouth every 6 (six) hours as needed for moderate pain or severe pain.   PRESERVISION AREDS 2 PO Take 1 capsule by mouth 2 (two) times daily.                Discharge Care Instructions  (From admission, onward)           Start     Ordered   02/28/21 0000  Discharge wound care:       Comments: Change dressing as instructed   02/28/21 1636            Follow-up Information     [facility] Follow up.   Why: Needs follow up in 4-6 weeks. A scheduler from our office will call you with a date/time. If you develop abdominal pain please re-connect the catheter to the bag you were given and call our office. Please call our office with any questions/concerns prior to your visit. Contact information: [place] [address] [phone]         Truitt Merle, MD Follow up.   Specialties: Hematology, Oncology Why: Call if you haven't heard from them by Friday Contact information: [place] [address] [phone]         Gatha Mayer, MD. Call.   Specialty: Gastroenterology Why: Or Dr. Celesta Aver partner, Dr. Clyde Lundborg information: [address] [place] [address] [phone]                 Discharge Exam: Danley Danker Weights   02/25/21 1313 02/26/21 1151  Weight: 51.5 kg 51.5 kg   General appearance: Elderly adult female, lying in bed, interactive, pleasant   Skin: Jaundice improving, no suspicious rashes or lesions Cardiac: RRR, no murmurs, no lower extremity edema Respiratory: Normal respiratory rate and rhythm, lungs clear without rales or wheezes Abdomen: Mild tenderness, no distention. Psych: Attention normal, affect normal, judgment insight appear normal   Condition at discharge: good  The results of significant diagnostics from this hospitalization (including imaging, microbiology, ancillary and laboratory) are listed below for reference.   Imaging Studies: MR 3D Recon At Scanner  Result Date: 02/24/2021 CLINICAL DATA:  87 year old female with history of jaundice. History of colon cancer. Evaluate for  choledocholithiasis. EXAM: MRI ABDOMEN WITHOUT AND WITH CONTRAST (INCLUDING MRCP) TECHNIQUE: Multiplanar multisequence MR imaging of the abdomen was performed both before and after the administration of intravenous contrast. Heavily T2-weighted images of the biliary and pancreatic ducts were obtained, and three-dimensional MRCP images were rendered by post processing. CONTRAST:  36m GADAVIST GADOBUTROL 1 MMOL/ML IV SOLN COMPARISON:  Prior abdominal MRI 12/04/2020. FINDINGS: Lower chest: Unremarkable. Hepatobiliary: Enlarging infiltrative mass in the central aspect of the liver, predominantly in segment 8 and superior aspect of segment 5, best appreciated on precontrast T1 weighted images (series 14) where the bulkiest portion of this lesion is estimated to measure approximately 7.8 x 4.1 cm. This lesion is heterogeneous in signal intensity, but predominantly T1 hypointense and mildly T2 hyperintense, demonstrating diffusion restriction, heterogeneous enhancement on arterial phase post gadolinium images, and persistent enhancement on delayed images. Notably, a large portion of the lesion extends into the intrahepatic  biliary tree, extending distally into the common hepatic duct and common bile duct which are both markedly dilated by the abnormal tumor within. This results in severe intrahepatic biliary ductal dilatation throughout all aspects of the liver. Common bile duct scratch the distal common bile duct below the lesion is normal in caliber measuring 6 mm shortly before the ampulla. Pancreas: No pancreatic mass. No pancreatic ductal dilatation. No pancreatic or peripancreatic fluid collections or inflammatory changes. Spleen:  Unremarkable. Adrenals/Urinary Tract: Multiple T1 hypointense, T2 hyperintense, nonenhancing lesions in both kidneys are compatible with simple cysts, largest of which is exophytic extending off the lower pole of the left kidney measuring up to 6.7 cm in diameter. No aggressive appearing  renal lesions are noted. No hydroureteronephrosis in the visualized portions of the abdomen. Bilateral adrenal glands are normal in appearance. Stomach/Bowel: Visualized portions are unremarkable. Vascular/Lymphatic: No aneurysm identified in the visualized abdominal vasculature. Multiple prominent borderline enlarged lymph nodes are noted in the hepatoduodenal ligament nodal station (axial image 43 of series 22) measuring 9 mm in short axis, and in the portacaval nodal station (axial image 40 of series 22) measuring 1.3 cm in short axis, demonstrating diffusion restriction, likely malignant. Other: No significant volume of ascites noted in the visualized portions of the peritoneal cavity. Musculoskeletal: No aggressive appearing osseous lesions are noted in the visualized portions of the skeleton. IMPRESSION: 1. Enlarging malignant lesion in the central aspect of the liver which has imaging characteristics most suggestive of intrahepatic cholangiocarcinoma, which is now invasive of the bile ducts extending distally into the common bile duct causing severe biliary obstruction as demonstrated by severe intrahepatic biliary dilatation. There are probable malignant lymph nodes in hepatoduodenal and portacaval nodal stations, as above. 2. Additional incidental findings, as above. Electronically Signed   By: Vinnie Langton M.D.   On: 02/24/2021 09:52   DG ERCP WITH SPHINCTEROTOMY  Result Date: 02/26/2021 CLINICAL DATA:  54-year-old female with history of common bile duct obstruction. EXAM: ERCP TECHNIQUE: Multiple spot images obtained with the fluoroscopic device and submitted for interpretation post-procedure. FLUOROSCOPY TIME:  Fluoroscopy Time:  12 minutes, 15 seconds Radiation Exposure Index (if provided by the fluoroscopic device): Not provided. Number of Acquired Spot Images: 22 COMPARISON:  MRCP from 02/23/2021 FINDINGS: Endoscope is position of the second portion the duodenum. There is retrograde  cannulation of the common bile duct. Limited retrograde cholangiogram was performed with submitted images of limited diagnostic quality with exception of demonstrating a patent ampulla. Choledochoscope is visualized within the common bile duct upon the final image. IMPRESSION: ERCP as above. Please refer to the dedicated procedural/operative note for further procedural details and findings. These images were submitted for radiologic interpretation only. Please see the procedural report for the amount of contrast and the fluoroscopy time utilized. Electronically Signed   By: Ruthann Cancer M.D.   On: 02/26/2021 15:53   DG C-Arm 1-60 Min-No Report  Result Date: 02/26/2021 Fluoroscopy was utilized by the requesting physician.  No radiographic interpretation.   DG C-Arm 1-60 Min-No Report  Result Date: 02/26/2021 Fluoroscopy was utilized by the requesting physician.  No radiographic interpretation.   MR ABDOMEN MRCP W WO CONTAST  Result Date: 02/24/2021 CLINICAL DATA:  87 year old female with history of jaundice. History of colon cancer. Evaluate for choledocholithiasis. EXAM: MRI ABDOMEN WITHOUT AND WITH CONTRAST (INCLUDING MRCP) TECHNIQUE: Multiplanar multisequence MR imaging of the abdomen was performed both before and after the administration of intravenous contrast. Heavily T2-weighted images of the biliary and pancreatic ducts  were obtained, and three-dimensional MRCP images were rendered by post processing. CONTRAST:  33m GADAVIST GADOBUTROL 1 MMOL/ML IV SOLN COMPARISON:  Prior abdominal MRI 12/04/2020. FINDINGS: Lower chest: Unremarkable. Hepatobiliary: Enlarging infiltrative mass in the central aspect of the liver, predominantly in segment 8 and superior aspect of segment 5, best appreciated on precontrast T1 weighted images (series 14) where the bulkiest portion of this lesion is estimated to measure approximately 7.8 x 4.1 cm. This lesion is heterogeneous in signal intensity, but predominantly  T1 hypointense and mildly T2 hyperintense, demonstrating diffusion restriction, heterogeneous enhancement on arterial phase post gadolinium images, and persistent enhancement on delayed images. Notably, a large portion of the lesion extends into the intrahepatic biliary tree, extending distally into the common hepatic duct and common bile duct which are both markedly dilated by the abnormal tumor within. This results in severe intrahepatic biliary ductal dilatation throughout all aspects of the liver. Common bile duct scratch the distal common bile duct below the lesion is normal in caliber measuring 6 mm shortly before the ampulla. Pancreas: No pancreatic mass. No pancreatic ductal dilatation. No pancreatic or peripancreatic fluid collections or inflammatory changes. Spleen:  Unremarkable. Adrenals/Urinary Tract: Multiple T1 hypointense, T2 hyperintense, nonenhancing lesions in both kidneys are compatible with simple cysts, largest of which is exophytic extending off the lower pole of the left kidney measuring up to 6.7 cm in diameter. No aggressive appearing renal lesions are noted. No hydroureteronephrosis in the visualized portions of the abdomen. Bilateral adrenal glands are normal in appearance. Stomach/Bowel: Visualized portions are unremarkable. Vascular/Lymphatic: No aneurysm identified in the visualized abdominal vasculature. Multiple prominent borderline enlarged lymph nodes are noted in the hepatoduodenal ligament nodal station (axial image 43 of series 22) measuring 9 mm in short axis, and in the portacaval nodal station (axial image 40 of series 22) measuring 1.3 cm in short axis, demonstrating diffusion restriction, likely malignant. Other: No significant volume of ascites noted in the visualized portions of the peritoneal cavity. Musculoskeletal: No aggressive appearing osseous lesions are noted in the visualized portions of the skeleton. IMPRESSION: 1. Enlarging malignant lesion in the central  aspect of the liver which has imaging characteristics most suggestive of intrahepatic cholangiocarcinoma, which is now invasive of the bile ducts extending distally into the common bile duct causing severe biliary obstruction as demonstrated by severe intrahepatic biliary dilatation. There are probable malignant lymph nodes in hepatoduodenal and portacaval nodal stations, as above. 2. Additional incidental findings, as above. Electronically Signed   By: DVinnie LangtonM.D.   On: 02/24/2021 09:52   MM 3D SCREEN BREAST BILATERAL  Result Date: 02/15/2021 CLINICAL DATA:  Screening. EXAM: DIGITAL SCREENING BILATERAL MAMMOGRAM WITH TOMOSYNTHESIS AND CAD TECHNIQUE: Bilateral screening digital craniocaudal and mediolateral oblique mammograms were obtained. Bilateral screening digital breast tomosynthesis was performed. The images were evaluated with computer-aided detection. COMPARISON:  Previous exam(s). ACR Breast Density Category b: There are scattered areas of fibroglandular density. FINDINGS: There are no findings suspicious for malignancy. IMPRESSION: No mammographic evidence of malignancy. A result letter of this screening mammogram will be mailed directly to the patient. RECOMMENDATION: Screening mammogram in one year. (Code:SM-B-01Y) BI-RADS CATEGORY  1: Negative. Electronically Signed   By: NAudie PintoM.D.   On: 02/15/2021 13:05   IR PERCUTANEOUS TRANSHEPATIC CHOLANGIOGRAM  Result Date: 02/28/2021 INDICATION: Mass near the biliary confluence, with biliary ductal dilatation and jaundice. Endoscopy could not achieve adequate biliary drainage. EXAM: PERCUTANEOUS TRANSHEPATIC CHOLANGIOGRAM PERCUTANEOUS INTERNAL/EXTERNAL BILIARY DRAINAGE CATHETER PLACEMENT MEDICATIONS: levaquin 500 MG IV; The  antibiotic was administered within an appropriate time frame prior to the initiation of the procedure. ANESTHESIA/SEDATION: Intravenous Fentanyl 12mg and Versed 120mwere administered as conscious sedation during  continuous monitoring of the patient's level of consciousness and physiological / cardiorespiratory status by the radiology RN, with a total moderate sedation time of 23 minutes. FLUOROSCOPY TIME:  4 minute 36 seconds; 18 mGy COMPLICATIONS: None immediate. PROCEDURE: Informed written consent was obtained from the patient after a thorough discussion of the procedural risks, benefits and alternatives. All questions were addressed. Maximal Sterile Barrier Technique was utilized including caps, mask, sterile gowns, sterile gloves, sterile drape, hand hygiene and skin antiseptic. A timeout was performed prior to the initiation of the procedure. Right upper quadrant prepped and draped in usual sterile fashion. A dilated peripheral bile duct in the inferior right lobe was localized under ultrasound and approach with a 21 gauge micropuncture needle. Bowel returned through the needle hub. 018 guidewire advanced easily and centrally. Tract dilated with a transitional dilator. Contrast injection was performed. Cholangiogram demonstrated massively dilated right intrahepatic biliary tree. No significant opacification of the peripheral left ducts. There is pruning of the biliary confluence. Intraluminal filling defect in the proximal CBD which is dilated. The distal CBD tapers to near normal caliber. An angled 035 roadrunner wire was advanced into the duodenum. Tract was dilated to facilitate placement of 10.2 French multi sidehole internal/external biliary drainage catheter, spanning the level of obstruction of the CBD. Catheter injection confirmed good position and patency. There is excellent bilious return through the hub of the catheter. Catheter was secured externally with 0 Prolene suture and StatLock and placed to gravity drain bag. The patient tolerated the procedure well. IMPRESSION: 1. Obstructing proximal CBD lesion with intrahepatic biliary ductal dilatation. 2. Technically successful internal/external right biliary  drain catheter placement. Electronically Signed   By: D Lucrezia Europe.D.   On: 02/28/2021 07:48    Microbiology: Results for orders placed or performed during the hospital encounter of 02/25/21  Resp Panel by RT-PCR (Flu A&B, Covid) Nasopharyngeal Swab     Status: None   Collection Time: 02/25/21  2:25 PM   Specimen: Nasopharyngeal Swab; Nasopharyngeal(NP) swabs in vial transport medium  Result Value Ref Range Status   SARS Coronavirus 2 by RT PCR NEGATIVE NEGATIVE Final    Comment: (NOTE) SARS-CoV-2 target nucleic acids are NOT DETECTED.  The SARS-CoV-2 RNA is generally detectable in upper respiratory specimens during the acute phase of infection. The lowest concentration of SARS-CoV-2 viral copies this assay can detect is 138 copies/mL. A negative result does not preclude SARS-Cov-2 infection and should not be used as the sole basis for treatment or other patient management decisions. A negative result may occur with  improper specimen collection/handling, submission of specimen other than nasopharyngeal swab, presence of viral mutation(s) within the areas targeted by this assay, and inadequate number of viral copies(<138 copies/mL). A negative result must be combined with clinical observations, patient history, and epidemiological information. The expected result is Negative.  Fact Sheet for Patients:  htEntrepreneurPulse.com.auFact Sheet for Healthcare Providers:  htIncredibleEmployment.beThis test is no t yet approved or cleared by the [place] and  has been authorized for detection and/or diagnosis of SARS-CoV-2 by FDA under an Emergency Use Authorization (EUA). This EUA will remain  in effect (meaning this test can be used) for the duration of the COVID-19 declaration under Section 564(b)(1) of the Act, 21 U.S.C.section 360bbb-3(b)(1), unless the authorization is terminated  or revoked  sooner.       Influenza A by PCR NEGATIVE  NEGATIVE Final   Influenza B by PCR NEGATIVE NEGATIVE Final    Comment: (NOTE) The Xpert Xpress SARS-CoV-2/FLU/RSV plus assay is intended as an aid in the diagnosis of influenza from Nasopharyngeal swab specimens and should not be used as a sole basis for treatment. Nasal washings and aspirates are unacceptable for Xpert Xpress SARS-CoV-2/FLU/RSV testing.  Fact Sheet for Patients: EntrepreneurPulse.com.au  Fact Sheet for Healthcare Providers: IncredibleEmployment.be  This test is not yet approved or cleared by the [place] FDA and has been authorized for detection and/or diagnosis of SARS-CoV-2 by FDA under an Emergency Use Authorization (EUA). This EUA will remain in effect (meaning this test can be used) for the duration of the COVID-19 declaration under Section 564(b)(1) of the Act, 21 U.S.C. section 360bbb-3(b)(1), unless the authorization is terminated or revoked.  Performed at [facility], [place] [address], [place], [place] [address]     Labs: CBC: Recent Labs  Lab 02/25/21 1424 02/26/21 0338 02/27/21 0017 02/28/21 0321  WBC 6.5 6.5 5.0 7.9  NEUTROABS 5.4  --   --   --   HGB 14.6 12.7 11.7* 11.3*  HCT 45.1 39.1 35.4* 34.7*  MCV 87.1 87.7 85.5 86.3  PLT 337 281 236 518   Basic Metabolic Panel: Recent Labs  Lab 02/25/21 1424 02/26/21 0338 02/27/21 0017 02/28/21 0321  NA 136 138 135 137  K 3.5 3.7 4.1 4.0  CL 104 107 105 108  CO2 '24 23 22 ' 21*  GLUCOSE 112* 88 190* 110*  BUN 21 22 29* 26*  CREATININE 0.60 0.54 0.84 0.86  CALCIUM 9.5 9.2 8.6* 8.7*   Liver Function Tests: Recent Labs  Lab 02/25/21 1424 02/26/21 0338 02/27/21 0017 02/28/21 0321  AST 94* 77* 76* 57*  ALT 121* 98* 83* 68*  ALKPHOS 320* 264* 267* 237*  BILITOT 18.4* 18.7* 18.1* 10.8*  PROT 6.9 6.3* 5.5* 5.6*  ALBUMIN 3.4* 2.8* 2.6* 2.6*   CBG: No results for input(s): GLUCAP in the last 168 hours.  Discharge time spent:  less than 30 minutes.  Signed:  Edwin Dada MD.  [facility] 03/01/2021

## 2021-03-06 ENCOUNTER — Other Ambulatory Visit: Payer: Self-pay

## 2021-03-06 NOTE — Progress Notes (Signed)
The proposed treatment discussed in conference is for discussion purpose only and is not a binding recommendation.  The patients have not been physically examined, or presented with their treatment options.  Therefore, final treatment plans cannot be decided.  

## 2021-03-07 ENCOUNTER — Other Ambulatory Visit (INDEPENDENT_AMBULATORY_CARE_PROVIDER_SITE_OTHER): Payer: Medicare Other

## 2021-03-07 ENCOUNTER — Other Ambulatory Visit: Payer: Self-pay

## 2021-03-07 DIAGNOSIS — K831 Obstruction of bile duct: Secondary | ICD-10-CM

## 2021-03-07 DIAGNOSIS — Z85038 Personal history of other malignant neoplasm of large intestine: Secondary | ICD-10-CM

## 2021-03-07 LAB — COMPREHENSIVE METABOLIC PANEL
ALT: 78 U/L — ABNORMAL HIGH (ref 0–35)
AST: 70 U/L — ABNORMAL HIGH (ref 0–37)
Albumin: 3.7 g/dL (ref 3.5–5.2)
Alkaline Phosphatase: 238 U/L — ABNORMAL HIGH (ref 39–117)
BUN: 14 mg/dL (ref 6–23)
CO2: 29 mEq/L (ref 19–32)
Calcium: 9.9 mg/dL (ref 8.4–10.5)
Chloride: 103 mEq/L (ref 96–112)
Creatinine, Ser: 0.71 mg/dL (ref 0.40–1.20)
GFR: 74.8 mL/min (ref >60.00)
Glucose, Bld: 92 mg/dL (ref 70–99)
Potassium: 4.1 mEq/L (ref 3.5–5.1)
Sodium: 139 mEq/L (ref 135–145)
Total Bilirubin: 5.5 mg/dL — ABNORMAL HIGH (ref 0.2–1.2)
Total Protein: 6.8 g/dL (ref 6.0–8.3)

## 2021-03-08 ENCOUNTER — Inpatient Hospital Stay: Payer: Medicare Other

## 2021-03-08 ENCOUNTER — Other Ambulatory Visit: Payer: Self-pay

## 2021-03-08 ENCOUNTER — Inpatient Hospital Stay: Payer: Medicare Other | Admitting: Hematology

## 2021-03-08 ENCOUNTER — Encounter: Payer: Self-pay | Admitting: Hematology

## 2021-03-08 ENCOUNTER — Inpatient Hospital Stay: Payer: Medicare Other | Attending: Hematology | Admitting: Hematology

## 2021-03-08 VITALS — BP 151/81 | HR 88 | Temp 97.9°F | Resp 16 | Ht 60.0 in | Wt 112.0 lb

## 2021-03-08 DIAGNOSIS — I10 Essential (primary) hypertension: Secondary | ICD-10-CM | POA: Diagnosis not present

## 2021-03-08 DIAGNOSIS — D500 Iron deficiency anemia secondary to blood loss (chronic): Secondary | ICD-10-CM | POA: Diagnosis not present

## 2021-03-08 DIAGNOSIS — E538 Deficiency of other specified B group vitamins: Secondary | ICD-10-CM | POA: Insufficient documentation

## 2021-03-08 DIAGNOSIS — C221 Intrahepatic bile duct carcinoma: Secondary | ICD-10-CM | POA: Insufficient documentation

## 2021-03-08 DIAGNOSIS — C240 Malignant neoplasm of extrahepatic bile duct: Secondary | ICD-10-CM | POA: Diagnosis not present

## 2021-03-08 DIAGNOSIS — C182 Malignant neoplasm of ascending colon: Secondary | ICD-10-CM

## 2021-03-08 DIAGNOSIS — Z85038 Personal history of other malignant neoplasm of large intestine: Secondary | ICD-10-CM | POA: Insufficient documentation

## 2021-03-08 DIAGNOSIS — D649 Anemia, unspecified: Secondary | ICD-10-CM | POA: Insufficient documentation

## 2021-03-08 NOTE — Progress Notes (Signed)
[facility] [facility]   [phone] [phone]   Clinic Follow up Note   Patient Care Team: Dettinger, [name], MD as PCP - General (Family Medicine) Zadie Rhine Clent Demark, MD as Consulting Physician (Ophthalmology) Donnie Mesa, MD as Consulting Physician (General Surgery) Truitt Merle, MD as Consulting Physician (Hematology)  Date of Service:  03/08/2021  CHIEF COMPLAINT: f/u of right colon cancer  CURRENT THERAPY:  Surveillance  ASSESSMENT & PLAN:  Ashley Peters is a 87 y.o. female with   1. newly diagnosed hilar cholangiocarcinoma, cTxN1M0 -T bili has been rising over the last month. Abdomen MRI on 02/23/21 showed: enlarging malignant liver lesion, now invasive of bile ducts extending distally into common bile duct causing obstruction; probable malignant lymph nodes. She was referred to the ED on 02/25/21, T bili at that time was 18.4. -emergent ERCP was done on 02/26/21 by Dr. Rush Landmark. Biopsy of common bile duct confirmed poorly differentiated carcinoma. -her case was presented to our GI conference; she is not a good candidate for surgery given her age and the location of the tumor.  -I discussed treatment options, such as chemotherapy, radiation therapy, and Y90. I think radiation is probably the best option for her if that's available. I will reach out to Dr. Lisbeth Renshaw regarding radiation. -her T Vergia Alcon is recovering, down to 5.5 today (03/08/21), her PTC is capped  -we will keep her scheduled appointments with me in 03/2021.  2. Cancer of the right colon, invasive adenocarcinoma, G1, pT4bN0Mx stage IIA, MSI-stable  -Diagnosed in 09/2016. Genetic testing was negative. Treated with right hemicolectomy and adjuvant Xeloda. Currently on surveillance. -She had a colonoscopy on 12/23/2017 with a few polyps removed. Due to her advanced age, no further surveillance colonoscopy was recommended.  -MRI abdomen 12/04/20 showed: no significant change in heterogeneously  enhancing lesions in anterior right liver lobe.    3. Anemia and B12 deficiency  -She currently receives monthly B12 injections with PCP.  -Anemia resolved     Plan  -I will reach out to Dr. Lisbeth Renshaw regarding radiation therapy for her cholangiocarcinoma  -labs and f/u on 04/04/21   No problem-specific Assessment & Plan notes found for this encounter.   SUMMARY OF ONCOLOGIC HISTORY: Oncology History Overview Note  Cancer Staging Cancer of right colon [facility]) Staging form: Colon and Rectum, AJCC 8th Edition - Pathologic stage from 10/14/2016: Stage IIC (pT4b, pN0, cM0) - Signed by Truitt Merle, MD on 11/07/2016     Cancer of right colon ([place])  09/19/2016 Imaging   CT A/P 09/19/16 IMPRESSION: Large 9.5 cm intraluminal mass in the cecum and ascending colon, consistent with colon carcinoma. Tumor extension into adjacent pericolonic fat seen along the lateral wall.   Mild right pericolonic and mesenteric lymphadenopathy, suspicious for metastatic disease. No other sites of metastatic disease identified.   Colonic diverticulosis, without radiographic evidence of diverticulitis. Tiny hiatal hernia, and small epigastric ventral hernia containing only fat.   Aortic atherosclerosis.   10/14/2016 Initial Diagnosis   Cancer of right colon ([place])   10/14/2016 Surgery   OPEN RIGHT HEMICOLECTOMY and REPAIR OF EPIGASTRIC VENTRAL HERNIA by Dr. Georgette Dover and Dr. Dalbert Batman    10/14/2016 Pathology Results   10/14/16 Diagnosis Colon, segmental resection for tumor, Right ADENOCARCINOMA WITH EXTENSIVE EXTRA CELLULAR MUCIN, GRADE 1, (11.0 CM) THE TUMOR INVADES THROUGH THE CECUM WALL INTO ADJACENT TERMINAL ILEUM (PT4B) NINETEEN BENIGN LYMPH NODES (0/19) SUPPURATIVE INFLAMMATION WITH FIBROSIS AND ADHESION TO ABDOMINAL WALL NO ADENOCARCINOMA IDENTIFIED   11/19/2016 Imaging  CT Chest WO Contrast 11/19/16 IMPRESSION: 1. No evidence of metastatic disease. 2. Aortic atherosclerosis (ICD10-170.0). Coronary  artery calcification.   11/25/2016 - 03/24/2017 Chemotherapy   Adjuvant Xeloda two weeks on, one week off starting 11/25/16 for 3 months     12/21/2016 Genetic Testing   Patient had genetic testing due to a personal history of colon cancer and a family history of breast, uterine, and colon cancer. The Common Hereditary Cancers Panel was ordered.  The Hereditary Gene Panel offered by [facility] includes sequencing and/or deletion duplication testing of the following 47 genes: APC, ATM, AXIN2, BARD1, BMPR1A, BRCA1, BRCA2, BRIP1, CDH1, CDKN2A (p14ARF), CDKN2A (p16INK4a), CHEK2, CDK4, CTNNA1, DICER1, EPCAM (Deletion/duplication testing only), GREM1 (promoter region deletion/duplication testing only), KIT, MEN1, MLH1, MSH2, MSH3, MSH6, MUTYH, NBN, NF1, NHTL1, PALB2, PDGFRA, PMS2, POLD1, POLE, PTEN, RAD50, RAD51C, RAD51D, SDHB, SDHC, SDHD, SMAD4, SMARCA4. STK11, TP53, TSC1, TSC2, and VHL.  The following genes were evaluated for sequence changes only: SDHA and HOXB13 c.251G>A variant only.  Results: Negative- no pathogenic variants identified.  The date of this test report is 12/21/2016.    04/27/2017 Imaging   CT AP W Contrast 04/27/17 IMPRESSION: Status post right hemicolectomy. No evidence of recurrent or metastatic disease. Additional stable ancillary findings as above.   08/07/2017 Imaging   08/07/2017 DEXA ASSESSMENT: The BMD measured at Forearm Radius 33% is 0.543 g/cm2 with a T-score of -3.9. This patient is considered osteoporotic according to [facility]) criteria.   11/25/2017 Imaging    11/25/2017 CT CAP IMPRESSION: 1. Abnormal appearance of the right lobe of the liver, with development of progressive peripheral intrahepatic duct dilatation with ill definition of the more central right hepatic duct. This appearance is indeterminate. Considerations include an incidental primary liver lesion such as cholangiocarcinoma, an otherwise occult liver metastasis with secondary  biliary duct dilatation, or infectious/inflammatory cholangitis. If this 87 year old can undergo MRI/MRCP, this should be considered. Multiphase CT (with delayed phase imaging to evaluate for possible cholangiocarcinoma) and/or focused ultrasound would be alternate imaging strategies. 2. Otherwise, no evidence of metastatic disease in the chest, abdomen, or pelvis. 3. Coronary artery atherosclerosis. Aortic Atherosclerosis (ICD10-I70.0).   12/10/2017 Imaging   12/10/2017 MRI Abdomen IMPRESSION: 1. Stricturing of the central bile ducts of segment V RIGHT hepatic lobe without clear lesion identified. Differential would include benign and malignant stricturing including cholangiocarcinoma. Evaluation is hampered by clip artifact from cholecystectomy clips and duodenum gas. FDG PET scan may or may not be helpful in identify lesion. ERCP would be a second option to evaluate this central obstruction. 2. No enhancing lesion the in the liver parenchyma parenchyma. Altered perfusion related to the biliary obstruction.   12/23/2017 Procedure   12/23/2017 Colonoscopy Impressions: - Decreased sphincter tone found on digital rectal exam. - Two 3 mm polyps in the rectum and in the descending colon, removed with a cold snare. Resected and retrieved. - One 8 mm polyp in the rectum, removed with a hot snare. Resected and retrieved. - Diverticulosis in the entire examined colon. - The examination was otherwise normal on direct and retroflexion views.   08/24/2018 Imaging   CT AP  IMPRESSION: 1. Over the last 9 months there has been a similar not significantly progressive appearance of biliary dilatation and complexity in segment 5 of the liver along with mild intrahepatic biliary dilatation elsewhere, and moderate extrahepatic biliary dilatation. Some of this is likely postinflammatory; the most complex and dilated bile duct did not have definite enhancement on the recent MRI  to definitively  indicate cholangiocarcinoma or metastatic lesion but the appearance of the right hepatic lobe is progressive from 09/19/2016 and from 04/27/2017. In the context of the patient's colon cancer, nuclear medicine PET-CT may provide some helpful adjunct information given this unusual appearance. 2. Other imaging findings of potential clinical significance: Mitral calcification. Aortoiliac atherosclerotic vascular disease. Renal cysts. Trace amount of gas in the urinary bladder, query recent catheterization. Descending and sigmoid colon diverticulosis. Two ventral hernias are enlarged compared to the prior exam but lax with wide ostium. No overt pathologic adenopathy currently.   08/24/2019 Imaging   CT CAP w contrast  IMPRESSION: 1. Right hemicolectomy. No evidence of recurrent or metastatic disease. 2. Intrahepatic/extrahepatic biliary ductal dilatation, unchanged from 11/25/2017. 3. Multiple midline supraumbilical ventral hernias contain unobstructed bowel or fat. 4. Aortic atherosclerosis (ICD10-I70.0). Coronary artery calcification.   09/20/2020 Imaging   MRI Abdomen  IMPRESSION: 1. Irregular peripheral enhancement and progressive enhancement of the lesion along the hepatic margin suspicious for peribiliary spread of colonic neoplasm (metastatic disease from colon primary) given findings on prior studies versus is cholangiocarcinoma. 2. Central biliary cast and stones filling the dilated common bile duct with diffuse biliary duct dilation. No visible enhancement to suggest tumor in these areas though assessment is limited due to clip artifact. Tumor extending into the RIGHT hepatic duct is difficult to exclude given the presence of clip artifact but on image 26 of series 33 there is suggestion of enhancing material within the lumen. 3. Slight increase in size of celiac lymph nodes dating back to 2021, suspicious in light of other findings. No retroperitoneal adenopathy.    12/04/2020 Imaging   MRI Abdomen  IMPRESSION: 1. Interval extraction of multiple common bile duct calculi with persistent intra and extrahepatic biliary ductal dilatation, similar in caliber to prior examination.   2. A large calculus remains in the intrahepatic common bile duct measuring at least 2.6 cm.   3. No significant change in heterogeneously enhancing, somewhat ill-defined lesions of the anterior right lobe of the liver, hepatic segment V, or within the central liver. As on prior examination, these remain consistent with either metastases from patient's known colon malignancy or alternately cholangiocarcinoma.      INTERVAL HISTORY:  Ashley Peters is here for a follow up of colon cancer. She was last seen by me on 02/28/21 while she was in the hospital. She presents to the clinic accompanied by her daughter. She reports the draining tube, which was capped, has been working well and is helpful.   All other systems were reviewed with the patient and are negative.  MEDICAL HISTORY:  Past Medical History:  Diagnosis Date   Anemia    Blood transfusion without reported diagnosis    Cancer ([place])    Colon cancer ([place]) dx'd 10/2016   Cystoid macular edema of left eye 07/05/2019   Diverticulosis    Family history of breast cancer    Family history of colon cancer    Family history of uterine cancer    GERD (gastroesophageal reflux disease)    History of kidney stones    Hyperlipidemia    Hypertension    Macular degeneration    Osteopenia     SURGICAL HISTORY: Past Surgical History:  Procedure Laterality Date   ABDOMINAL HYSTERECTOMY     ablation tr Great saphenous vein  10/17/2009   BILIARY BRUSHING  02/26/2021   Procedure: BILIARY BRUSHING;  Surgeon: Irving Copas., MD;  Location: [facility];  Service: Gastroenterology;;  BIOPSY  02/26/2021   Procedure: BIOPSY;  Surgeon: Rush Landmark Telford Nab., MD;  Location: [facility];  Service: Gastroenterology;;    BREAST EXCISIONAL BIOPSY Bilateral 1971   brest biopsy  [place] N/A 10/14/2016   Procedure: REPAIR OF EPIGASTRIC VENTRAL HERNIA;  Surgeon: Donnie Mesa, MD;  Location: [place];  Service: General;  Laterality: N/A;   ERCP N/A 09/21/2020   Procedure: ENDOSCOPIC RETROGRADE CHOLANGIOPANCREATOGRAPHY (ERCP);  Surgeon: Gatha Mayer, MD;  Location: [facility];  Service: Endoscopy;  Laterality: N/A;   ERCP N/A 02/26/2021   Procedure: ENDOSCOPIC RETROGRADE CHOLANGIOPANCREATOGRAPHY (ERCP);  Surgeon: Irving Copas., MD;  Location: [facility];  Service: Gastroenterology;  Laterality: N/A;   IR PERCUTANEOUS TRANSHEPATIC CHOLANGIOGRAM  02/27/2021   lt. distal ureteral stone extraction  09/14/1991   PARTIAL COLECTOMY Right 10/14/2016   Procedure: OPEN RIGHT HEMICOLECTOMY;  Surgeon: Donnie Mesa, MD;  Location: [place];  Service: General;  Laterality: Right;   REMOVAL OF STONES  09/21/2020   Procedure: REMOVAL OF STONES;  Surgeon: Gatha Mayer, MD;  Location: [facility];  Service: Endoscopy;;   REMOVAL OF STONES  02/26/2021   Procedure: REMOVAL OF SLUDGE;  Surgeon: Irving Copas., MD;  Location: [facility];  Service: Gastroenterology;;   [name] Mayans  09/21/2020   Procedure: [name] Mayans;  Surgeon: Gatha Mayer, MD;  Location: [facility];  Service: Endoscopy;;   SPHINCTEROTOMY  02/26/2021   Procedure: [name] Mayans;  Surgeon: Mansouraty, Telford Nab., MD;  Location: [facility];  Service: Gastroenterology;;   [facility] CHOLANGIOSCOPY N/A 02/26/2021   Procedure: XKGYJEHU CHOLANGIOSCOPY;  Surgeon: Irving Copas., MD;  Location: [facility];  Service: Gastroenterology;  Laterality: N/A;   STONE EXTRACTION WITH BASKET  09/21/2020   Procedure: STONE EXTRACTION WITH BASKET;  Surgeon: Gatha Mayer, MD;  Location: [facility];  Service: Endoscopy;;    I have reviewed the social history and family history with the patient and  they are unchanged from previous note.  ALLERGIES:  is allergic to benicar [olmesartan medoxomil] and amoxicillin.  MEDICATIONS:  Current Outpatient Medications  Medication Sig Dispense Refill   acetaminophen (TYLENOL) 500 MG tablet Take 2 tablets (1,000 mg total) by mouth every 6 (six) hours as needed for moderate pain. (Patient taking differently: Take 500-1,000 mg by mouth every 6 (six) hours as needed for moderate pain.) 30 tablet 0   alendronate (FOSAMAX) 70 MG tablet Take with a full glass of water on an empty stomach. (Patient taking differently: Take 70 mg by mouth every Monday. Take with a full glass of water on an empty stomach.) 12 tablet 3   Cyanocobalamin 1000 MCG/ML KIT Inject 1,000 mcg as directed every 30 (thirty) days.     Multiple Vitamins-Minerals (PRESERVISION AREDS 2 PO) Take 1 capsule by mouth 2 (two) times daily.     oxyCODONE (OXY IR/ROXICODONE) 5 MG immediate release tablet Take 0.5 tablets (2.5 mg total) by mouth every 6 (six) hours as needed for moderate pain or severe pain. 10 tablet 0   Current Facility-Administered Medications  Medication Dose Route Frequency Provider Last Rate Last Admin   cyanocobalamin ((VITAMIN B-12)) injection 1,000 mcg  1,000 mcg Intramuscular Q30 days Dettinger, [name], MD   1,000 mcg at 02/14/21 0828    PHYSICAL EXAMINATION: ECOG PERFORMANCE STATUS: 2 - Symptomatic, <50% confined to bed  Vitals:   03/08/21 1228  BP: (!) 151/81  Pulse: 88  Resp: 16  Temp: 97.9 F (36.6 C)  SpO2: 98%  Wt Readings from Last 3 Encounters:  03/08/21 112 lb (50.8 kg)  02/26/21 113 lb 8.6 oz (51.5 kg)  01/23/21 119 lb (54 kg)     GENERAL:alert, no distress and comfortable SKIN: skin color normal, no rashes or significant lesions EYES: normal, Conjunctiva are pink and non-injected, sclera clear  NEURO: alert & oriented x 3 with fluent speech  LABORATORY DATA:  I have reviewed the data as listed CBC Latest Ref Rng & Units 02/28/2021  02/27/2021 02/26/2021  WBC 4.0 - 10.5 K/uL 7.9 5.0 6.5  Hemoglobin 12.0 - 15.0 g/dL 11.3(L) 11.7(L) 12.7  Hematocrit 36.0 - 46.0 % 34.7(L) 35.4(L) 39.1  Platelets 150 - 400 K/uL 230 236 281     CMP Latest Ref Rng & Units 03/07/2021 02/28/2021 02/27/2021  Glucose 70 - 99 mg/dL 92 110(H) 190(H)  BUN 6 - 23 mg/dL 14 26(H) 29(H)  Creatinine 0.40 - 1.20 mg/dL 0.71 0.86 0.84  Sodium 135 - 145 mEq/L 139 137 135  Potassium 3.5 - 5.1 mEq/L 4.1 4.0 4.1  Chloride 96 - 112 mEq/L 103 108 105  CO2 19 - 32 mEq/L 29 21(L) 22  Calcium 8.4 - 10.5 mg/dL 9.9 8.7(L) 8.6(L)  Total Protein 6.0 - 8.3 g/dL 6.8 5.6(L) 5.5(L)  Total Bilirubin 0.2 - 1.2 mg/dL 5.5(H) 10.8(H) 18.1(HH)  Alkaline Phos 39 - 117 U/L 238(H) 237(H) 267(H)  AST 0 - 37 U/L 70(H) 57(H) 76(H)  ALT 0 - 35 U/L 78(H) 68(H) 83(H)      RADIOGRAPHIC STUDIES: I have personally reviewed the radiological images as listed and agreed with the findings in the report. No results found.    No orders of the defined types were placed in this encounter.  All questions were answered. The patient knows to call the clinic with any problems, questions or concerns. No barriers to learning was detected. The total time spent in the appointment was 30 minutes.     Truitt Merle, MD 03/08/2021   I, Wilburn Mylar, am acting as scribe for Truitt Merle, MD.   I have reviewed the above documentation for accuracy and completeness, and I agree with the above.

## 2021-03-12 ENCOUNTER — Other Ambulatory Visit: Payer: Self-pay

## 2021-03-12 ENCOUNTER — Telehealth: Payer: Self-pay | Admitting: Hematology

## 2021-03-12 DIAGNOSIS — C182 Malignant neoplasm of ascending colon: Secondary | ICD-10-CM

## 2021-03-12 NOTE — Telephone Encounter (Signed)
Left message with rescheduled upcoming appointment per 12/30 los.

## 2021-03-14 NOTE — Progress Notes (Signed)
GI Location of Tumor / Histology: Hilar Cholangiocarcinoma  Ashley Peters presented in December 2022 to the ER due to results of her abdominal MRI and increasing bilirubin.  She reports dark urine x 1 week, itching and jaundice x 3-4 days.  Percutaneous internal/external biliary drainage catheter placement 02/27/2021  ERCP 02/26/2021:   MRI Abd 02/23/2021: Enlarging malignant liver lesion, now invasive of bile ducts extending distally into the common bile duct causing obstruction; probable malignant lymph nodes.  PET 01/01/2021: 9 mm ground-glass nodule in the posterior right lower lobe is hypermetabolic. This nodule is new since 09/19/2020 while potentially infectious/inflammatory, primary bronchogenic neoplasm or metastatic disease could have this appearance. Close follow-up recommended.  2 areas of low level hypermetabolism in the liver without obvious underlying correlate on CT imaging. Metastatic disease remains a concern.   Biopsies of Bile Duct Mass 02/26/2021   Past/Anticipated interventions by surgeon, if any:   Past/Anticipated interventions by medical oncology, if any:  Dr. Burr Medico 03/08/2021 -her case was presented to our GI conference; she is not a good candidate for surgery given her age and the location of the tumor.  -I discussed treatment options, such as chemotherapy, radiation therapy, and Y90. I think radiation is probably the best option for her if that's available. I will reach out to Dr. Lisbeth Renshaw regarding radiation. -her Ashley Peters is recovering, down to 5.5 today (03/08/21), her PTC is capped  -we will keep her scheduled appointments with me in 04/04/2021. Cancer of the right colon, invasive adenocarcinoma, G1, pT4bN0Mx stage IIA, MSI-stable  -Diagnosed in 09/2016. Genetic testing was negative. Treated with right hemicolectomy and adjuvant Xeloda. Currently on surveillance.   Weight changes, if any: 10-15 pounds lost since about July 2022.  Bowel/Bladder complaints, if  any: No changes noted.  Nausea / Vomiting, if any: No  Pain issues, if any:  Notes occasional pain in her abdomen- right side.    SAFETY ISSUES: Prior radiation? No Pacemaker/ICD? No Possible current pregnancy? Hysterectomy Is the patient on methotrexate? No  Current Complaints/Details:

## 2021-03-18 ENCOUNTER — Ambulatory Visit (INDEPENDENT_AMBULATORY_CARE_PROVIDER_SITE_OTHER): Payer: Medicare Other | Admitting: *Deleted

## 2021-03-18 DIAGNOSIS — E538 Deficiency of other specified B group vitamins: Secondary | ICD-10-CM | POA: Diagnosis not present

## 2021-03-18 NOTE — Progress Notes (Signed)
Radiation Oncology         [phone]) [phone] ________________________________  Name: Ashley Peters        MRN: [id number]  Date of Service: 03/19/2021 DOB: Oct 13, 1928  [id number], Ashley Kaufmann, MD  Ashley Merle, MD     REFERRING PHYSICIAN: Truitt Merle, MD   DIAGNOSIS: The primary encounter diagnosis was Cancer of right colon [facility]). A diagnosis of Intrahepatic cholangiocarcinoma ([place]) was also pertinent to this visit.   HISTORY OF PRESENT ILLNESS: Ashley Peters is a 88 y.o. female seen at the request of Dr. 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  The patient was treated in 2018 with right hemicolectomy followed by adjuvant Xeloda for Stage IIA, pT4bN0 disease.  She continues to be followed expectantly and her last colonoscopy in 2019 showed benign polyps and no additional surveillance is recommended given her age.  Recent blood work however mention that total bilirubin levels have been increasing and an abdominal MRI on 02/23/2021 showed a malignant lesion in the liver now invading the bile ducts and extending into the common biliary system with concern for malignant adenopathy.  She was taken to the emergency room on 02/25/2021 and her total bilirubin at that time was 18.4.  She underwent emergent ERCP on 02/26/2021.  She also underwent percutaneous cholecystotomy placement.  Cytology from ERCP  showed poorly differentiated carcinoma within the common bile duct specimen.  Stenting was performed.  She was not felt to be a candidate for surgical intervention given her age and location of her tumor.  Options were discussed including chemotherapy and Y 90 versus radiotherapy.  She is seen today to discuss options of radiotherapy.    PREVIOUS RADIATION THERAPY: No   PAST MEDICAL HISTORY:  Past Medical History:  Diagnosis Date   Anemia    Blood transfusion without reported diagnosis    Cancer ([place])    Colon cancer ([place]) dx'd 10/2016   Cystoid macular  edema of left eye 07/05/2019   Diverticulosis    Family history of breast cancer    Family history of colon cancer    Family history of uterine cancer    GERD (gastroesophageal reflux disease)    History of kidney stones    Hyperlipidemia    Hypertension    Macular degeneration    Osteopenia        PAST SURGICAL HISTORY: Past Surgical History:  Procedure Laterality Date   ABDOMINAL HYSTERECTOMY     ablation tr Great saphenous vein  10/17/2009   BILIARY BRUSHING  02/26/2021   Procedure: BILIARY BRUSHING;  Surgeon: Ashley Peters., MD;  Location: [facility];  Service: Gastroenterology;;   BIOPSY  02/26/2021   Procedure: BIOPSY;  Surgeon: Ashley Peters., MD;  Location: [facility];  Service: Gastroenterology;;   BREAST EXCISIONAL BIOPSY Bilateral 1971   brest biopsy  [place] N/A 10/14/2016   Procedure: REPAIR OF EPIGASTRIC VENTRAL HERNIA;  Surgeon: Ashley Mesa, MD;  Location: [facility];  Service: General;  Laterality: N/A;   ERCP N/A 09/21/2020   Procedure: ENDOSCOPIC RETROGRADE CHOLANGIOPANCREATOGRAPHY (ERCP);  Surgeon: Ashley Mayer, MD;  Location: [facility];  Service: Endoscopy;  Laterality: N/A;   ERCP N/A 02/26/2021   Procedure: ENDOSCOPIC RETROGRADE CHOLANGIOPANCREATOGRAPHY (ERCP);  Surgeon: Ashley Peters., MD;  Location: [facility];  Service: Gastroenterology;  Laterality: N/A;   IR PERCUTANEOUS TRANSHEPATIC CHOLANGIOGRAM  02/27/2021   lt. distal ureteral stone extraction  09/14/1991   PARTIAL COLECTOMY Right 10/14/2016   Procedure: OPEN RIGHT HEMICOLECTOMY;  Surgeon: Ashley Mesa, MD;  Location: [facility];  Service: General;  Laterality: Right;   REMOVAL OF STONES  09/21/2020   Procedure: REMOVAL OF STONES;  Surgeon: Ashley Mayer, MD;  Location: [facility];  Service: Endoscopy;;   REMOVAL OF STONES  02/26/2021   Procedure: REMOVAL OF SLUDGE;  Surgeon: Ashley Peters., MD;  Location: [facility];  Service: Gastroenterology;;   Ashley Peters  09/21/2020   Procedure: Ashley Peters;  Surgeon: Ashley Mayer, MD;  Location: [facility];  Service: Endoscopy;;   SPHINCTEROTOMY  02/26/2021   Procedure: Ashley Peters;  Surgeon: Ashley Peters, Ashley Nab., MD;  Location: [facility];  Service: Gastroenterology;;   [facility] CHOLANGIOSCOPY N/A 02/26/2021   Procedure: ZOXWRUEA CHOLANGIOSCOPY;  Surgeon: Ashley Peters., MD;  Location: [facility];  Service: Gastroenterology;  Laterality: N/A;   STONE EXTRACTION WITH BASKET  09/21/2020   Procedure: STONE EXTRACTION WITH BASKET;  Surgeon: Ashley Mayer, MD;  Location: [facility];  Service: Endoscopy;;     FAMILY HISTORY:  Family History  Problem Relation Age of Onset   Stroke Father 50   Breast cancer Sister        dx 50's, died at 23   Seizures Son    Diabetes Brother    Kidney disease Brother    Heart disease Brother    Epilepsy Son    Colon cancer Other 52   Uterine cancer Other 86   Hodgkin's lymphoma Other    Breast cancer Other      SOCIAL HISTORY:  reports that she has never smoked. She has never used smokeless tobacco. She reports that she does not drink alcohol and does not use drugs.  The patient is widowed and lives in [place].  She is accompanied by her daughter Ashley Peters   ALLERGIES: Benicar [olmesartan medoxomil] and Amoxicillin   MEDICATIONS:  Current Outpatient Medications  Medication Sig Dispense Refill   acetaminophen (TYLENOL) 500 MG tablet Take 2 tablets (1,000 mg total) by mouth every 6 (six) hours as needed for moderate pain. (Patient taking differently: Take 500-1,000 mg by mouth every 6 (six) hours as needed for moderate pain.) 30 tablet 0   alendronate (FOSAMAX) 70 MG tablet Take with a full glass of water on an empty stomach. (Patient taking differently: Take 70 mg by mouth every Monday. Take with a full glass of water on an empty stomach.) 12 tablet 3   Cyanocobalamin 1000  MCG/ML KIT Inject 1,000 mcg as directed every 30 (thirty) days.     Multiple Vitamins-Minerals (PRESERVISION AREDS 2 PO) Take 1 capsule by mouth 2 (two) times daily.     oxyCODONE (OXY IR/ROXICODONE) 5 MG immediate release tablet Take 0.5 tablets (2.5 mg total) by mouth every 6 (six) hours as needed for moderate pain or severe pain. 10 tablet 0   Current Facility-Administered Medications  Medication Dose Route Frequency Provider Last Rate Last Admin   cyanocobalamin ((VITAMIN B-12)) injection 1,000 mcg  1,000 mcg Intramuscular Q30 days Dettinger, Ashley Kaufmann, MD   1,000 mcg at 03/18/21 5409     REVIEW OF SYSTEMS: On review of systems, the patient reports that she is having some soreness in her abdominal wall from her cholecystotomy tube but not like when she had it placed. She has it capped off and feels overall like she's doing well. She denies any progressive jaundice or pruritis.  She reports about 15 pounds of unexpected weight loss since  the summer. She denies any changes in her bowel or bladder activity and is not seeing dark urine anymore. No other complaints are verbalized.   PHYSICAL EXAM:  Wt Readings from Last 3 Encounters:  03/19/21 113 lb 9.6 oz (51.5 kg)  03/08/21 112 lb (50.8 kg)  02/26/21 113 lb 8.6 oz (51.5 kg)   Temp Readings from Last 3 Encounters:  03/19/21 (!) 97.4 F (36.3 C)  03/08/21 97.9 F (36.6 C) (Oral)  03/01/21 98.4 F (36.9 C) (Oral)   BP Readings from Last 3 Encounters:  03/19/21 (!) 155/71  03/08/21 (!) 151/81  03/01/21 (!) 176/85   Pulse Readings from Last 3 Encounters:  03/19/21 79  03/08/21 88  03/01/21 72   Pain Assessment Pain Score: 0-No pain/10  In general this is a well appearing Caucasian female in no acute distress.  Scleral icterus is mild, and light yellowing is noted of her skin of her face and both upper extremities. She's alert and oriented x4 and appropriate throughout the examination. Cardiopulmonary assessment is negative for  acute distress and she exhibits normal effort. Her drain site in the right upper quadrant is intact. The tube is capped and no abnormalities in the site are appreciated.     ECOG = 1  0 - Asymptomatic (Fully active, able to carry on all predisease activities without restriction)  1 - Symptomatic but completely ambulatory (Restricted in physically strenuous activity but ambulatory and able to carry out work of a light or sedentary nature. For example, light housework, office work)  2 - Symptomatic, <50% in bed during the day (Ambulatory and capable of all self care but unable to carry out any work activities. Up and about more than 50% of waking hours)  3 - Symptomatic, >50% in bed, but not bedbound (Capable of only limited self-care, confined to bed or chair 50% or more of waking hours)  4 - Bedbound (Completely disabled. Cannot carry on any self-care. Totally confined to bed or chair)  5 - Death   Eustace Pen MM, Creech RH, Tormey DC, et al. [phone]). "Toxicity and response criteria of the [facility]". [place] Oncol. 5 (6): 649-55    LABORATORY DATA:  Lab Results  Component Value Date   WBC 7.9 02/28/2021   HGB 11.3 (L) 02/28/2021   HCT 34.7 (L) 02/28/2021   MCV 86.3 02/28/2021   PLT 230 02/28/2021   Lab Results  Component Value Date   NA 139 03/07/2021   K 4.1 03/07/2021   CL 103 03/07/2021   CO2 29 03/07/2021   Lab Results  Component Value Date   ALT 78 (H) 03/07/2021   AST 70 (H) 03/07/2021   ALKPHOS 238 (H) 03/07/2021   BILITOT 5.5 (H) 03/07/2021      RADIOGRAPHY: MR 3D Recon At Scanner  Result Date: 02/24/2021 CLINICAL DATA:  88 year old female with history of jaundice. History of colon cancer. Evaluate for choledocholithiasis. EXAM: MRI ABDOMEN WITHOUT AND WITH CONTRAST (INCLUDING MRCP) TECHNIQUE: Multiplanar multisequence MR imaging of the abdomen was performed both before and after the administration of intravenous contrast. Heavily  T2-weighted images of the biliary and pancreatic ducts were obtained, and three-dimensional MRCP images were rendered by post processing. CONTRAST:  24m GADAVIST GADOBUTROL 1 MMOL/ML IV SOLN COMPARISON:  Prior abdominal MRI 12/04/2020. FINDINGS: Lower chest: Unremarkable. Hepatobiliary: Enlarging infiltrative mass in the central aspect of the liver, predominantly in segment 8 and superior aspect of segment 5, best appreciated on precontrast T1 weighted images (series  14) where the bulkiest portion of this lesion is estimated to measure approximately 7.8 x 4.1 cm. This lesion is heterogeneous in signal intensity, but predominantly T1 hypointense and mildly T2 hyperintense, demonstrating diffusion restriction, heterogeneous enhancement on arterial phase post gadolinium images, and persistent enhancement on delayed images. Notably, a large portion of the lesion extends into the intrahepatic biliary tree, extending distally into the common hepatic duct and common bile duct which are both markedly dilated by the abnormal tumor within. This results in severe intrahepatic biliary ductal dilatation throughout all aspects of the liver. Common bile duct scratch the distal common bile duct below the lesion is normal in caliber measuring 6 mm shortly before the ampulla. Pancreas: No pancreatic mass. No pancreatic ductal dilatation. No pancreatic or peripancreatic fluid collections or inflammatory changes. Spleen:  Unremarkable. Adrenals/Urinary Tract: Multiple T1 hypointense, T2 hyperintense, nonenhancing lesions in both kidneys are compatible with simple cysts, largest of which is exophytic extending off the lower pole of the left kidney measuring up to 6.7 cm in diameter. No aggressive appearing renal lesions are noted. No hydroureteronephrosis in the visualized portions of the abdomen. Bilateral adrenal glands are normal in appearance. Stomach/Bowel: Visualized portions are unremarkable. Vascular/Lymphatic: No aneurysm  identified in the visualized abdominal vasculature. Multiple prominent borderline enlarged lymph nodes are noted in the hepatoduodenal ligament nodal station (axial image 43 of series 22) measuring 9 mm in short axis, and in the portacaval nodal station (axial image 40 of series 22) measuring 1.3 cm in short axis, demonstrating diffusion restriction, likely malignant. Other: No significant volume of ascites noted in the visualized portions of the peritoneal cavity. Musculoskeletal: No aggressive appearing osseous lesions are noted in the visualized portions of the skeleton. IMPRESSION: 1. Enlarging malignant lesion in the central aspect of the liver which has imaging characteristics most suggestive of intrahepatic cholangiocarcinoma, which is now invasive of the bile ducts extending distally into the common bile duct causing severe biliary obstruction as demonstrated by severe intrahepatic biliary dilatation. There are probable malignant lymph nodes in hepatoduodenal and portacaval nodal stations, as above. 2. Additional incidental findings, as above. Electronically Signed   By: Vinnie Langton M.D.   On: 02/24/2021 09:52   DG ERCP WITH SPHINCTEROTOMY  Result Date: 02/26/2021 CLINICAL DATA:  36-year-old female with history of common bile duct obstruction. EXAM: ERCP TECHNIQUE: Multiple spot images obtained with the fluoroscopic device and submitted for interpretation post-procedure. FLUOROSCOPY TIME:  Fluoroscopy Time:  12 minutes, 15 seconds Radiation Exposure Index (if provided by the fluoroscopic device): Not provided. Number of Acquired Spot Images: 22 COMPARISON:  MRCP from 02/23/2021 FINDINGS: Endoscope is position of the second portion the duodenum. There is retrograde cannulation of the common bile duct. Limited retrograde cholangiogram was performed with submitted images of limited diagnostic quality with exception of demonstrating a patent ampulla. Choledochoscope is visualized within the common bile  duct upon the final image. IMPRESSION: ERCP as above. Please refer to the dedicated procedural/operative note for further procedural details and findings. These images were submitted for radiologic interpretation only. Please see the procedural report for the amount of contrast and the fluoroscopy time utilized. Electronically Signed   By: Ruthann Cancer M.D.   On: 02/26/2021 15:53   DG C-Arm 1-60 Min-No Report  Result Date: 02/26/2021 Fluoroscopy was utilized by the requesting physician.  No radiographic interpretation.   DG C-Arm 1-60 Min-No Report  Result Date: 02/26/2021 Fluoroscopy was utilized by the requesting physician.  No radiographic interpretation.   MR ABDOMEN MRCP  W WO CONTAST  Result Date: 02/24/2021 CLINICAL DATA:  88 year old female with history of jaundice. History of colon cancer. Evaluate for choledocholithiasis. EXAM: MRI ABDOMEN WITHOUT AND WITH CONTRAST (INCLUDING MRCP) TECHNIQUE: Multiplanar multisequence MR imaging of the abdomen was performed both before and after the administration of intravenous contrast. Heavily T2-weighted images of the biliary and pancreatic ducts were obtained, and three-dimensional MRCP images were rendered by post processing. CONTRAST:  7m GADAVIST GADOBUTROL 1 MMOL/ML IV SOLN COMPARISON:  Prior abdominal MRI 12/04/2020. FINDINGS: Lower chest: Unremarkable. Hepatobiliary: Enlarging infiltrative mass in the central aspect of the liver, predominantly in segment 8 and superior aspect of segment 5, best appreciated on precontrast T1 weighted images (series 14) where the bulkiest portion of this lesion is estimated to measure approximately 7.8 x 4.1 cm. This lesion is heterogeneous in signal intensity, but predominantly T1 hypointense and mildly T2 hyperintense, demonstrating diffusion restriction, heterogeneous enhancement on arterial phase post gadolinium images, and persistent enhancement on delayed images. Notably, a large portion of the lesion extends  into the intrahepatic biliary tree, extending distally into the common hepatic duct and common bile duct which are both markedly dilated by the abnormal tumor within. This results in severe intrahepatic biliary ductal dilatation throughout all aspects of the liver. Common bile duct scratch the distal common bile duct below the lesion is normal in caliber measuring 6 mm shortly before the ampulla. Pancreas: No pancreatic mass. No pancreatic ductal dilatation. No pancreatic or peripancreatic fluid collections or inflammatory changes. Spleen:  Unremarkable. Adrenals/Urinary Tract: Multiple T1 hypointense, T2 hyperintense, nonenhancing lesions in both kidneys are compatible with simple cysts, largest of which is exophytic extending off the lower pole of the left kidney measuring up to 6.7 cm in diameter. No aggressive appearing renal lesions are noted. No hydroureteronephrosis in the visualized portions of the abdomen. Bilateral adrenal glands are normal in appearance. Stomach/Bowel: Visualized portions are unremarkable. Vascular/Lymphatic: No aneurysm identified in the visualized abdominal vasculature. Multiple prominent borderline enlarged lymph nodes are noted in the hepatoduodenal ligament nodal station (axial image 43 of series 22) measuring 9 mm in short axis, and in the portacaval nodal station (axial image 40 of series 22) measuring 1.3 cm in short axis, demonstrating diffusion restriction, likely malignant. Other: No significant volume of ascites noted in the visualized portions of the peritoneal cavity. Musculoskeletal: No aggressive appearing osseous lesions are noted in the visualized portions of the skeleton. IMPRESSION: 1. Enlarging malignant lesion in the central aspect of the liver which has imaging characteristics most suggestive of intrahepatic cholangiocarcinoma, which is now invasive of the bile ducts extending distally into the common bile duct causing severe biliary obstruction as demonstrated by  severe intrahepatic biliary dilatation. There are probable malignant lymph nodes in hepatoduodenal and portacaval nodal stations, as above. 2. Additional incidental findings, as above. Electronically Signed   By: DVinnie LangtonM.D.   On: 02/24/2021 09:52   IR PERCUTANEOUS TRANSHEPATIC CHOLANGIOGRAM  Result Date: 02/28/2021 INDICATION: Mass near the biliary confluence, with biliary ductal dilatation and jaundice. Endoscopy could not achieve adequate biliary drainage. EXAM: PERCUTANEOUS TRANSHEPATIC CHOLANGIOGRAM PERCUTANEOUS INTERNAL/EXTERNAL BILIARY DRAINAGE CATHETER PLACEMENT MEDICATIONS: levaquin 500 MG IV; The antibiotic was administered within an appropriate time frame prior to the initiation of the procedure. ANESTHESIA/SEDATION: Intravenous Fentanyl 528m and Versed 48m66mere administered as conscious sedation during continuous monitoring of the patient's level of consciousness and physiological / cardiorespiratory status by the radiology RN, with a total moderate sedation time of 23 minutes. FLUOROSCOPY TIME:  4 minute  36 seconds; 18 mGy COMPLICATIONS: None immediate. PROCEDURE: Informed written consent was obtained from the patient after a thorough discussion of the procedural risks, benefits and alternatives. All questions were addressed. Maximal Sterile Barrier Technique was utilized including caps, mask, sterile gowns, sterile gloves, sterile drape, hand hygiene and skin antiseptic. A timeout was performed prior to the initiation of the procedure. Right upper quadrant prepped and draped in usual sterile fashion. A dilated peripheral bile duct in the inferior right lobe was localized under ultrasound and approach with a 21 gauge micropuncture needle. Bowel returned through the needle hub. 018 guidewire advanced easily and centrally. Tract dilated with a transitional dilator. Contrast injection was performed. Cholangiogram demonstrated massively dilated right intrahepatic biliary tree. No significant  opacification of the peripheral left ducts. There is pruning of the biliary confluence. Intraluminal filling defect in the proximal CBD which is dilated. The distal CBD tapers to near normal caliber. An angled 035 roadrunner wire was advanced into the duodenum. Tract was dilated to facilitate placement of 10.2 French multi sidehole internal/external biliary drainage catheter, spanning the level of obstruction of the CBD. Catheter injection confirmed good position and patency. There is excellent bilious return through the hub of the catheter. Catheter was secured externally with 0 Prolene suture and StatLock and placed to gravity drain bag. The patient tolerated the procedure well. IMPRESSION: 1. Obstructing proximal CBD lesion with intrahepatic biliary ductal dilatation. 2. Technically successful internal/external right biliary drain catheter placement. Electronically Signed   By: Lucrezia Europe M.D.   On: 02/28/2021 07:48       IMPRESSION/PLAN: 1. Locally advanced intrahepatic cholangiocarcinoma. Dr. Lisbeth Renshaw discusses the pathology findings and reviews the nature of biliary carcinomas. He discusses that surgery and systemic therapy are not recommended given the extend of her disease and age. Rather we discussed conference review of her case including Y90 and or ultrahypofractionated radiotherapy, formerly classified as SBRT Like treatment. While Y90 is an option, it is an invasive procedure and may be a two part procedure with embolization at a later time. The patient desires less invasive options and Dr. Lisbeth Renshaw feels that radiotherapy would be a good option for reducing her tumor size, hopefully offering some longer term control but still overall a palliative option. We discussed the risks, benefits, short, and long term effects of radiotherapy, as well as the palliative intent, and the patient is interested in proceeding. Dr. Lisbeth Renshaw discusses the delivery and logistics of radiotherapy and anticipates a course of 8-10  fractions of radiotherapy.  Written consent is obtained and placed in the chart, a copy was provided to the patient. She will be contacted to coordinate simulation with IV contrast. She will follow up with Dr. Burr Peters. We also discussed the possibility after therapy of converting to palliative/hospice based care. She was open to this discussion and we will defer decision making about disposition until after radiotherapy. 2. History of stage II colon cancer.  She will continue to follow-up with Dr. Burr Peters expectantly as she is followed for #1.   In a visit lasting 60 minutes, greater than 50% of the time was spent face to face discussing the patient's condition, in preparation for the discussion, and coordinating the patient's care.   The above documentation reflects my direct findings during this shared patient visit. Please see the separate note by Dr. Lisbeth Renshaw on this date for the remainder of the patient's plan of care.    Carola Rhine, [facility]   **Disclaimer: This note was dictated with voice recognition  software. Similar sounding words can inadvertently be transcribed and this note may contain transcription errors which may not have been corrected upon publication of note.**

## 2021-03-18 NOTE — Progress Notes (Signed)
B12 given in right deltoid, pt tolerated well

## 2021-03-19 ENCOUNTER — Ambulatory Visit
Admission: RE | Admit: 2021-03-19 | Discharge: 2021-03-19 | Disposition: A | Discharge status: Home / Self Care | Payer: Medicare Other | Source: Ambulatory Visit | Attending: Radiation Oncology | Admitting: Radiation Oncology

## 2021-03-19 ENCOUNTER — Other Ambulatory Visit: Payer: Self-pay

## 2021-03-19 ENCOUNTER — Encounter: Payer: Self-pay | Admitting: Radiation Oncology

## 2021-03-19 VITALS — BP 155/71 | HR 79 | Temp 97.4°F | Resp 18 | Ht 60.0 in | Wt 113.6 lb

## 2021-03-19 DIAGNOSIS — C221 Intrahepatic bile duct carcinoma: Secondary | ICD-10-CM

## 2021-03-19 DIAGNOSIS — I10 Essential (primary) hypertension: Secondary | ICD-10-CM | POA: Insufficient documentation

## 2021-03-19 DIAGNOSIS — C186 Malignant neoplasm of descending colon: Secondary | ICD-10-CM | POA: Diagnosis not present

## 2021-03-19 DIAGNOSIS — K219 Gastro-esophageal reflux disease without esophagitis: Secondary | ICD-10-CM | POA: Insufficient documentation

## 2021-03-19 DIAGNOSIS — Z800 Family history of malignant neoplasm of digestive organs: Secondary | ICD-10-CM | POA: Insufficient documentation

## 2021-03-19 DIAGNOSIS — E785 Hyperlipidemia, unspecified: Secondary | ICD-10-CM | POA: Diagnosis not present

## 2021-03-19 DIAGNOSIS — M8580 Other specified disorders of bone density and structure, unspecified site: Secondary | ICD-10-CM | POA: Diagnosis not present

## 2021-03-19 DIAGNOSIS — C182 Malignant neoplasm of ascending colon: Secondary | ICD-10-CM

## 2021-03-19 DIAGNOSIS — Z803 Family history of malignant neoplasm of breast: Secondary | ICD-10-CM | POA: Diagnosis not present

## 2021-03-25 NOTE — Progress Notes (Signed)
Has armband been applied?  Yes  Does patient have an allergy to IV contrast dye?: No   Has patient ever received premedication for IV contrast dye?:  n/a  Does patient take metformin?: No  If patient does take metformin when was the last dose: n/a  Date of lab work: 03/07/2021 BUN: 14  CR: 0.71 eGfr:  74.80  IV site: LAC  Has IV site been added to flowsheet?  Yes  BP (!) 149/77 (BP Location: Left Arm, Patient Position: Sitting)    Pulse 87    Temp (!) 97.1 F (36.2 C) (Temporal)    Resp 18    Ht 5' (1.524 m)    Wt 114 lb 6 oz (51.9 kg)    SpO2 100%    BMI 22.34 kg/m

## 2021-03-27 ENCOUNTER — Ambulatory Visit
Admission: RE | Admit: 2021-03-27 | Discharge: 2021-03-27 | Disposition: A | Discharge status: Home / Self Care | Payer: Medicare Other | Source: Ambulatory Visit | Attending: Radiation Oncology | Admitting: Radiation Oncology

## 2021-03-27 ENCOUNTER — Other Ambulatory Visit: Payer: Self-pay

## 2021-03-27 VITALS — BP 149/77 | HR 87 | Temp 97.1°F | Resp 18 | Ht 60.0 in | Wt 114.4 lb

## 2021-03-27 DIAGNOSIS — C221 Intrahepatic bile duct carcinoma: Secondary | ICD-10-CM

## 2021-03-27 DIAGNOSIS — C240 Malignant neoplasm of extrahepatic bile duct: Secondary | ICD-10-CM

## 2021-03-27 DIAGNOSIS — Z510 Encounter for antineoplastic radiation therapy: Secondary | ICD-10-CM | POA: Insufficient documentation

## 2021-03-27 MED ORDER — SODIUM CHLORIDE 0.9% FLUSH
10.0000 mL | Freq: Once | INTRAVENOUS | Status: AC
  Administered 2021-03-27: 10 mL via INTRAVENOUS

## 2021-03-28 ENCOUNTER — Ambulatory Visit (HOSPITAL_COMMUNITY): Admit: 2021-03-28 | Payer: Medicare Other | Admitting: Gastroenterology

## 2021-03-28 ENCOUNTER — Encounter (HOSPITAL_COMMUNITY): Payer: Self-pay

## 2021-03-28 SURGERY — ENDOSCOPIC RETROGRADE CHOLANGIOPANCREATOGRAPHY (ERCP) WITH PROPOFOL
Anesthesia: General

## 2021-04-01 ENCOUNTER — Other Ambulatory Visit: Payer: Self-pay

## 2021-04-01 ENCOUNTER — Encounter (HOSPITAL_COMMUNITY): Payer: Self-pay

## 2021-04-01 ENCOUNTER — Ambulatory Visit (HOSPITAL_COMMUNITY)
Admission: RE | Admit: 2021-04-01 | Discharge: 2021-04-01 | Disposition: A | Discharge status: Home / Self Care | Payer: Medicare Other | Source: Ambulatory Visit | Attending: Hematology | Admitting: Hematology

## 2021-04-01 DIAGNOSIS — C182 Malignant neoplasm of ascending colon: Secondary | ICD-10-CM | POA: Diagnosis not present

## 2021-04-01 DIAGNOSIS — R918 Other nonspecific abnormal finding of lung field: Secondary | ICD-10-CM | POA: Diagnosis not present

## 2021-04-01 DIAGNOSIS — R911 Solitary pulmonary nodule: Secondary | ICD-10-CM | POA: Diagnosis not present

## 2021-04-01 DIAGNOSIS — I700 Atherosclerosis of aorta: Secondary | ICD-10-CM | POA: Diagnosis not present

## 2021-04-01 MED ORDER — SODIUM CHLORIDE (PF) 0.9 % IJ SOLN
INTRAMUSCULAR | Status: AC
  Filled 2021-04-01: qty 50

## 2021-04-01 MED ORDER — IOHEXOL 300 MG/ML  SOLN
75.0000 mL | Freq: Once | INTRAMUSCULAR | Status: AC | PRN
  Administered 2021-04-01: 75 mL via INTRAVENOUS

## 2021-04-02 ENCOUNTER — Other Ambulatory Visit: Payer: TRICARE For Life (TFL)

## 2021-04-04 ENCOUNTER — Inpatient Hospital Stay: Payer: Medicare Other | Attending: Hematology | Admitting: Hematology

## 2021-04-04 ENCOUNTER — Encounter: Payer: Self-pay | Admitting: Hematology

## 2021-04-04 ENCOUNTER — Other Ambulatory Visit: Payer: Self-pay

## 2021-04-04 ENCOUNTER — Inpatient Hospital Stay: Payer: Medicare Other

## 2021-04-04 VITALS — BP 175/80 | HR 77 | Temp 98.1°F | Resp 18 | Ht 60.0 in | Wt 118.3 lb

## 2021-04-04 DIAGNOSIS — Z85038 Personal history of other malignant neoplasm of large intestine: Secondary | ICD-10-CM | POA: Diagnosis not present

## 2021-04-04 DIAGNOSIS — C787 Secondary malignant neoplasm of liver and intrahepatic bile duct: Secondary | ICD-10-CM | POA: Insufficient documentation

## 2021-04-04 DIAGNOSIS — E538 Deficiency of other specified B group vitamins: Secondary | ICD-10-CM

## 2021-04-04 DIAGNOSIS — C221 Intrahepatic bile duct carcinoma: Secondary | ICD-10-CM | POA: Diagnosis not present

## 2021-04-04 DIAGNOSIS — D649 Anemia, unspecified: Secondary | ICD-10-CM | POA: Diagnosis not present

## 2021-04-04 DIAGNOSIS — C182 Malignant neoplasm of ascending colon: Secondary | ICD-10-CM

## 2021-04-04 DIAGNOSIS — D500 Iron deficiency anemia secondary to blood loss (chronic): Secondary | ICD-10-CM

## 2021-04-04 LAB — CBC WITH DIFFERENTIAL/PLATELET
Abs Immature Granulocytes: 0.03 10*3/uL (ref 0.00–0.07)
Basophils Absolute: 0.1 10*3/uL (ref 0.0–0.1)
Basophils Relative: 1 %
Eosinophils Absolute: 0.1 10*3/uL (ref 0.0–0.5)
Eosinophils Relative: 2 %
HCT: 43.2 % (ref 36.0–46.0)
Hemoglobin: 14.2 g/dL (ref 12.0–15.0)
Immature Granulocytes: 0 %
Lymphocytes Relative: 8 %
Lymphs Abs: 0.6 10*3/uL — ABNORMAL LOW (ref 0.7–4.0)
MCH: 29.2 pg (ref 26.0–34.0)
MCHC: 32.9 g/dL (ref 30.0–36.0)
MCV: 88.9 fL (ref 80.0–100.0)
Monocytes Absolute: 0.5 10*3/uL (ref 0.1–1.0)
Monocytes Relative: 7 %
Neutro Abs: 5.7 10*3/uL (ref 1.7–7.7)
Neutrophils Relative %: 82 %
Platelets: 187 10*3/uL (ref 150–400)
RBC: 4.86 MIL/uL (ref 3.87–5.11)
RDW: 15.9 % — ABNORMAL HIGH (ref 11.5–15.5)
WBC: 6.9 10*3/uL (ref 4.0–10.5)
nRBC: 0 % (ref 0.0–0.2)

## 2021-04-04 LAB — COMPREHENSIVE METABOLIC PANEL
ALT: 14 U/L (ref 0–44)
AST: 20 U/L (ref 15–41)
Albumin: 4.2 g/dL (ref 3.5–5.0)
Alkaline Phosphatase: 70 U/L (ref 38–126)
Anion gap: 5 (ref 5–15)
BUN: 20 mg/dL (ref 8–23)
CO2: 28 mmol/L (ref 22–32)
Calcium: 9.9 mg/dL (ref 8.9–10.3)
Chloride: 107 mmol/L (ref 98–111)
Creatinine, Ser: 0.78 mg/dL (ref 0.44–1.00)
GFR, Estimated: >60 mL/min (ref >60)
Glucose, Bld: 104 mg/dL — ABNORMAL HIGH (ref 70–99)
Potassium: 4.2 mmol/L (ref 3.5–5.1)
Sodium: 140 mmol/L (ref 135–145)
Total Bilirubin: 1.7 mg/dL — ABNORMAL HIGH (ref 0.3–1.2)
Total Protein: 7.2 g/dL (ref 6.5–8.1)

## 2021-04-04 LAB — IRON AND IRON BINDING CAPACITY (CC-WL,HP ONLY)
Iron: 94 ug/dL (ref 28–170)
Saturation Ratios: 27 % (ref 10.4–31.8)
TIBC: 351 ug/dL (ref 250–450)
UIBC: 257 ug/dL (ref 148–442)

## 2021-04-04 LAB — FERRITIN: Ferritin: 113 ng/mL (ref 11–307)

## 2021-04-04 LAB — VITAMIN B12: Vitamin B-12: 488 pg/mL (ref 180–914)

## 2021-04-04 LAB — CEA (IN HOUSE-CHCC): CEA (CHCC-In House): 4.76 ng/mL (ref 0.00–5.00)

## 2021-04-04 NOTE — Progress Notes (Signed)
[place]   [phone] [phone]   Clinic Follow up Note   Patient Care Team: Dettinger, [name], MD as PCP - General (Family Medicine) Zadie Rhine Clent Demark, MD as Consulting Physician (Ophthalmology) Donnie Mesa, MD as Consulting Physician (General Surgery) Truitt Merle, MD as Consulting Physician (Hematology)  Date of Service:  04/04/2021  CHIEF COMPLAINT: f/u of cholangiocarcinoma, h/o colon cancer  CURRENT THERAPY:  PENDING Liver SBRT  ASSESSMENT & PLAN:  Ashley Peters is a 88 y.o. female with   1. Hilar Cholangiocarcinoma, cTxN1M0 -T bili has been rising over the last month. Abdomen MRI on 02/23/21 showed: enlarging malignant liver lesion, now invasive of bile ducts extending distally into common bile duct causing obstruction; probable malignant lymph nodes. She was referred to the ED on 02/25/21, T bili at that time was 18.4. -emergent ERCP was done on 02/26/21 by Dr. Rush Landmark. Biopsy of common bile duct confirmed poorly differentiated carcinoma. -her case was presented to our GI conference; she is not a good candidate for surgery given her age and the location of the tumor.  -staging chest CT on 04/01/21 showed: ill-defined nodules in right lung, indeterminate, largest is 9 mm in RML. -she was referred to Dr. Lisbeth Renshaw on 03/19/21. They plan for 10 fractions of SBRT to start 04/08/21. -she is also scheduled to f/u with IR tomorrow, 04/05/21. Hope they can internalize and stent her CBD and remove the PTC if possible  -labs reviewed, CBC is WNL, CMP pending. Physical exam shows continued healing. Her draining tube is capped and has not been drained since her hospitalization. IR will evaluate her tomorrow.   2. Cancer of the right colon, invasive adenocarcinoma, G1, pT4bN0Mx stage IIA, MSI-stable  -Diagnosed in 09/2016. Genetic testing was negative. Treated with right hemicolectomy and adjuvant Xeloda. Currently on surveillance. -She had a colonoscopy  on 12/23/2017 with a few polyps removed. Due to her advanced age, no further surveillance colonoscopy was recommended.  -MRI abdomen 12/04/20 showed: no significant change in heterogeneously enhancing lesions in anterior right liver lobe.    3. Anemia and B12 deficiency  -She currently receives monthly B12 injections with PCP.  -Anemia resolved     Plan  -I will call them with any abnormal results on her lab today  -proceed with radiation 04/08/21 -lab and f/u in 6 weeks -F/U with IR tomorrow    No problem-specific Assessment & Plan notes found for this encounter.   SUMMARY OF ONCOLOGIC HISTORY: Oncology History Overview Note   Cancer Staging  Cancer of right colon [facility]) Staging form: Colon and Rectum, AJCC 8th Edition - Pathologic stage from 10/14/2016: Stage IIC (pT4b, pN0, cM0) - Signed by Truitt Merle, MD on 11/07/2016 Total positive nodes: 0 Tumor deposits (TD): Absent Perineural invasion (PNI): Absent Microsatellite instability (MSI): Stable  Hilar cholangiocarcinoma ([place]) Staging form: Distal Bile Duct, AJCC 8th Edition - Clinical stage from 03/08/2021: Stage Unknown (cTX, cN1, cM0) - Signed by Truitt Merle, MD on 03/08/2021     Cancer of right colon ([place])  09/19/2016 Imaging   CT A/P 09/19/16 IMPRESSION: Large 9.5 cm intraluminal mass in the cecum and ascending colon, consistent with colon carcinoma. Tumor extension into adjacent pericolonic fat seen along the lateral wall.   Mild right pericolonic and mesenteric lymphadenopathy, suspicious for metastatic disease. No other sites of metastatic disease identified.   Colonic diverticulosis, without radiographic evidence of diverticulitis. Tiny hiatal hernia, and small epigastric ventral hernia containing only fat.   Aortic atherosclerosis.  10/14/2016 Initial Diagnosis   Cancer of right colon ([place])   10/14/2016 Surgery   OPEN RIGHT HEMICOLECTOMY and REPAIR OF EPIGASTRIC VENTRAL HERNIA by Dr. Georgette Dover and Dr. Dalbert Batman     10/14/2016 Pathology Results   10/14/16 Diagnosis Colon, segmental resection for tumor, Right ADENOCARCINOMA WITH EXTENSIVE EXTRA CELLULAR MUCIN, GRADE 1, (11.0 CM) THE TUMOR INVADES THROUGH THE CECUM WALL INTO ADJACENT TERMINAL ILEUM (PT4B) NINETEEN BENIGN LYMPH NODES (0/19) SUPPURATIVE INFLAMMATION WITH FIBROSIS AND ADHESION TO ABDOMINAL WALL NO ADENOCARCINOMA IDENTIFIED   11/19/2016 Imaging   CT Chest WO Contrast 11/19/16 IMPRESSION: 1. No evidence of metastatic disease. 2. Aortic atherosclerosis (ICD10-170.0). Coronary artery calcification.   11/25/2016 - 03/24/2017 Chemotherapy   Adjuvant Xeloda two weeks on, one week off starting 11/25/16 for 3 months     12/21/2016 Genetic Testing   Patient had genetic testing due to a personal history of colon cancer and a family history of breast, uterine, and colon cancer. The Common Hereditary Cancers Panel was ordered.  The Hereditary Gene Panel offered by [facility] includes sequencing and/or deletion duplication testing of the following 47 genes: APC, ATM, AXIN2, BARD1, BMPR1A, BRCA1, BRCA2, BRIP1, CDH1, CDKN2A (p14ARF), CDKN2A (p16INK4a), CHEK2, CDK4, CTNNA1, DICER1, EPCAM (Deletion/duplication testing only), GREM1 (promoter region deletion/duplication testing only), KIT, MEN1, MLH1, MSH2, MSH3, MSH6, MUTYH, NBN, NF1, NHTL1, PALB2, PDGFRA, PMS2, POLD1, POLE, PTEN, RAD50, RAD51C, RAD51D, SDHB, SDHC, SDHD, SMAD4, SMARCA4. STK11, TP53, TSC1, TSC2, and VHL.  The following genes were evaluated for sequence changes only: SDHA and HOXB13 c.251G>A variant only.  Results: Negative- no pathogenic variants identified.  The date of this test report is 12/21/2016.    04/27/2017 Imaging   CT AP W Contrast 04/27/17 IMPRESSION: Status post right hemicolectomy. No evidence of recurrent or metastatic disease. Additional stable ancillary findings as above.   08/07/2017 Imaging   08/07/2017 DEXA ASSESSMENT: The BMD measured at Forearm Radius 33% is 0.543 g/cm2 with  a T-score of -3.9. This patient is considered osteoporotic according to [facility]) criteria.   11/25/2017 Imaging    11/25/2017 CT CAP IMPRESSION: 1. Abnormal appearance of the right lobe of the liver, with development of progressive peripheral intrahepatic duct dilatation with ill definition of the more central right hepatic duct. This appearance is indeterminate. Considerations include an incidental primary liver lesion such as cholangiocarcinoma, an otherwise occult liver metastasis with secondary biliary duct dilatation, or infectious/inflammatory cholangitis. If this 88 year old can undergo MRI/MRCP, this should be considered. Multiphase CT (with delayed phase imaging to evaluate for possible cholangiocarcinoma) and/or focused ultrasound would be alternate imaging strategies. 2. Otherwise, no evidence of metastatic disease in the chest, abdomen, or pelvis. 3. Coronary artery atherosclerosis. Aortic Atherosclerosis (ICD10-I70.0).   12/10/2017 Imaging   12/10/2017 MRI Abdomen IMPRESSION: 1. Stricturing of the central bile ducts of segment V RIGHT hepatic lobe without clear lesion identified. Differential would include benign and malignant stricturing including cholangiocarcinoma. Evaluation is hampered by clip artifact from cholecystectomy clips and duodenum gas. FDG PET scan may or may not be helpful in identify lesion. ERCP would be a second option to evaluate this central obstruction. 2. No enhancing lesion the in the liver parenchyma parenchyma. Altered perfusion related to the biliary obstruction.   12/23/2017 Procedure   12/23/2017 Colonoscopy Impressions: - Decreased sphincter tone found on digital rectal exam. - Two 3 mm polyps in the rectum and in the descending colon, removed with a cold snare. Resected and retrieved. - One 8 mm polyp in the rectum, removed with a hot  snare. Resected and retrieved. - Diverticulosis in the entire examined colon. -  The examination was otherwise normal on direct and retroflexion views.   08/24/2018 Imaging   CT AP  IMPRESSION: 1. Over the last 9 months there has been a similar not significantly progressive appearance of biliary dilatation and complexity in segment 5 of the liver along with mild intrahepatic biliary dilatation elsewhere, and moderate extrahepatic biliary dilatation. Some of this is likely postinflammatory; the most complex and dilated bile duct did not have definite enhancement on the recent MRI to definitively indicate cholangiocarcinoma or metastatic lesion but the appearance of the right hepatic lobe is progressive from 09/19/2016 and from 04/27/2017. In the context of the patient's colon cancer, nuclear medicine PET-CT may provide some helpful adjunct information given this unusual appearance. 2. Other imaging findings of potential clinical significance: Mitral calcification. Aortoiliac atherosclerotic vascular disease. Renal cysts. Trace amount of gas in the urinary bladder, query recent catheterization. Descending and sigmoid colon diverticulosis. Two ventral hernias are enlarged compared to the prior exam but lax with wide ostium. No overt pathologic adenopathy currently.   08/24/2019 Imaging   CT CAP w contrast  IMPRESSION: 1. Right hemicolectomy. No evidence of recurrent or metastatic disease. 2. Intrahepatic/extrahepatic biliary ductal dilatation, unchanged from 11/25/2017. 3. Multiple midline supraumbilical ventral hernias contain unobstructed bowel or fat. 4. Aortic atherosclerosis (ICD10-I70.0). Coronary artery calcification.   09/20/2020 Imaging   MRI Abdomen  IMPRESSION: 1. Irregular peripheral enhancement and progressive enhancement of the lesion along the hepatic margin suspicious for peribiliary spread of colonic neoplasm (metastatic disease from colon primary) given findings on prior studies versus is cholangiocarcinoma. 2. Central biliary cast and stones  filling the dilated common bile duct with diffuse biliary duct dilation. No visible enhancement to suggest tumor in these areas though assessment is limited due to clip artifact. Tumor extending into the RIGHT hepatic duct is difficult to exclude given the presence of clip artifact but on image 26 of series 33 there is suggestion of enhancing material within the lumen. 3. Slight increase in size of celiac lymph nodes dating back to 2021, suspicious in light of other findings. No retroperitoneal adenopathy.   12/04/2020 Imaging   MRI Abdomen  IMPRESSION: 1. Interval extraction of multiple common bile duct calculi with persistent intra and extrahepatic biliary ductal dilatation, similar in caliber to prior examination.   2. A large calculus remains in the intrahepatic common bile duct measuring at least 2.6 cm.   3. No significant change in heterogeneously enhancing, somewhat ill-defined lesions of the anterior right lobe of the liver, hepatic segment V, or within the central liver. As on prior examination, these remain consistent with either metastases from patient's known colon malignancy or alternately cholangiocarcinoma.   Hilar cholangiocarcinoma ([place])  12/22/2020 PET scan   IMPRESSION: 1. 9 mm ground-glass nodule in the posterior right lower lobe is hypermetabolic. This nodule is new since 09/19/2020 while potentially infectious/inflammatory, primary bronchogenic neoplasm or metastatic disease could have this appearance. Close follow-up recommended. 2. 2 areas of low level hypermetabolism in the liver without obvious underlying correlate on CT imaging. Metastatic disease remains a concern. 3.  Aortic Atherosclerois (ICD10-170.0)   02/26/2021 Pathology Results   FINAL MICROSCOPIC DIAGNOSIS:  A. COMMON BILE DUCT, BIOPSY:  -  Poorly differentiated carcinoma    A. COMMON BILE DUCT, BRUSHING:  FINAL MICROSCOPIC DIAGNOSIS:  - Malignant cells consistent with adenocarcinoma     02/26/2021 Procedure   ERCP, Dr. Rush Landmark  Impression: - The major papilla was  located entirely within a diverticulum. The major papilla appeared edematous and had a prior biliary sphincterotomy. - Difficult cannulation as wire had preferential placement into the cystic stump but eventually wire was able to go into the biliary tree partially. Could not attain deep selective cannulation of proximal biliary tree. - A biliary tract obstruction was found in the upper third of the main duct as a result of mass-like area on fluoroscopy. - A biliary sphincterotomy extension was performed and this allowed for angled wire placement. - The biliary tree was swept and sludge was found. - Cells for cytology obtained in the middle third of the main bile duct with 2 brushes. - Spyglass cholangioscopy performed. 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 This could not be traversed. This was sampled via Spybite. - Unable to access proximal biliary tree, so could not place any stents unfortunately.   03/08/2021 Initial Diagnosis   Hilar cholangiocarcinoma ([place])   03/08/2021 Cancer Staging   Staging form: Distal Bile Duct, AJCC 8th Edition - Clinical stage from 03/08/2021: Stage Unknown (cTX, cN1, cM0) - Signed by Truitt Merle, MD on 03/08/2021    04/01/2021 Imaging   EXAM: CT CHEST WITH CONTRAST  IMPRESSION: 1. Ill-defined nodules in the right lung which could be of infectious or inflammatory etiology, although underlying neoplasm is not entirely excluded. The largest of these is a sub solid 9 x 8 mm nodule in the right middle lobe. Non-contrast chest CT at 3-6 months is recommended. If nodules persist, subsequent management will be based upon the most suspicious nodule(s). This recommendation follows the consensus statement: Guidelines for Management of Incidental Pulmonary Nodules Detected on CT Images: From the Fleischner Society 2017;  Radiology 2017; 284:228-243. 2. Infiltrative intrahepatic neoplasm with invasion into the common bile duct again noted, presumably reflective of cholangiocarcinoma. 3. Aortic atherosclerosis, in addition to left anterior descending coronary artery disease.      INTERVAL HISTORY:  Ashley Peters is here for a follow up of cholangiocarcinoma. She was last seen by me on 03/08/21. She presents to the clinic accompanied by her daughter. She reports she is doing well and continuing to recover.   All other systems were reviewed with the patient and are negative.  MEDICAL HISTORY:  Past Medical History:  Diagnosis Date   Anemia    Blood transfusion without reported diagnosis    Cancer ([place])    Colon cancer ([place]) dx'd 10/2016   Cystoid macular edema of left eye 07/05/2019   Diverticulosis    Family history of breast cancer    Family history of colon cancer    Family history of uterine cancer    GERD (gastroesophageal reflux disease)    History of kidney stones    Hyperlipidemia    Hypertension    Macular degeneration    Osteopenia     SURGICAL HISTORY: Past Surgical History:  Procedure Laterality Date   ABDOMINAL HYSTERECTOMY     ablation tr Great saphenous vein  10/17/2009   BILIARY BRUSHING  02/26/2021   Procedure: BILIARY BRUSHING;  Surgeon: Irving Copas., MD;  Location: [facility];  Service: Gastroenterology;;   BIOPSY  02/26/2021   Procedure: BIOPSY;  Surgeon: Irving Copas., MD;  Location: [facility];  Service: Gastroenterology;;   BREAST EXCISIONAL BIOPSY Bilateral 1971   brest biopsy  [place] N/A 10/14/2016   Procedure: REPAIR OF EPIGASTRIC VENTRAL HERNIA;  Surgeon: Donnie Mesa,  MD;  Location: [facility];  Service: General;  Laterality: N/A;   ERCP N/A 09/21/2020   Procedure: ENDOSCOPIC RETROGRADE CHOLANGIOPANCREATOGRAPHY (ERCP);  Surgeon: Gatha Mayer, MD;  Location: [facility];  Service: Endoscopy;   Laterality: N/A;   ERCP N/A 02/26/2021   Procedure: ENDOSCOPIC RETROGRADE CHOLANGIOPANCREATOGRAPHY (ERCP);  Surgeon: Irving Copas., MD;  Location: [facility];  Service: Gastroenterology;  Laterality: N/A;   IR PERCUTANEOUS TRANSHEPATIC CHOLANGIOGRAM  02/27/2021   lt. distal ureteral stone extraction  09/14/1991   PARTIAL COLECTOMY Right 10/14/2016   Procedure: OPEN RIGHT HEMICOLECTOMY;  Surgeon: Donnie Mesa, MD;  Location: [facility];  Service: General;  Laterality: Right;   REMOVAL OF STONES  09/21/2020   Procedure: REMOVAL OF STONES;  Surgeon: Gatha Mayer, MD;  Location: [facility];  Service: Endoscopy;;   REMOVAL OF STONES  02/26/2021   Procedure: REMOVAL OF SLUDGE;  Surgeon: Irving Copas., MD;  Location: [facility];  Service: Gastroenterology;;   [name] Mayans  09/21/2020   Procedure: [name] Mayans;  Surgeon: Gatha Mayer, MD;  Location: [facility];  Service: Endoscopy;;   SPHINCTEROTOMY  02/26/2021   Procedure: [name] Mayans;  Surgeon: Mansouraty, Telford Nab., MD;  Location: [facility];  Service: Gastroenterology;;   [facility] CHOLANGIOSCOPY N/A 02/26/2021   Procedure: RCVELFYB CHOLANGIOSCOPY;  Surgeon: Irving Copas., MD;  Location: [facility];  Service: Gastroenterology;  Laterality: N/A;   STONE EXTRACTION WITH BASKET  09/21/2020   Procedure: STONE EXTRACTION WITH BASKET;  Surgeon: Gatha Mayer, MD;  Location: [facility];  Service: Endoscopy;;    I have reviewed the social history and family history with the patient and they are unchanged from previous note.  ALLERGIES:  is allergic to benicar [olmesartan medoxomil] and amoxicillin.  MEDICATIONS:  Current Outpatient Medications  Medication Sig Dispense Refill   acetaminophen (TYLENOL) 500 MG tablet Take 2 tablets (1,000 mg total) by mouth every 6 (six) hours as needed for moderate pain. (Patient taking differently: Take 500-1,000 mg by mouth every 6 (six) hours as needed for moderate  pain.) 30 tablet 0   alendronate (FOSAMAX) 70 MG tablet Take with a full glass of water on an empty stomach. (Patient taking differently: Take 70 mg by mouth every Monday. Take with a full glass of water on an empty stomach.) 12 tablet 3   Cyanocobalamin 1000 MCG/ML KIT Inject 1,000 mcg as directed every 30 (thirty) days.     Multiple Vitamins-Minerals (PRESERVISION AREDS 2 PO) Take 1 capsule by mouth 2 (two) times daily.     oxyCODONE (OXY IR/ROXICODONE) 5 MG immediate release tablet Take 0.5 tablets (2.5 mg total) by mouth every 6 (six) hours as needed for moderate pain or severe pain. 10 tablet 0   Current Facility-Administered Medications  Medication Dose Route Frequency Provider Last Rate Last Admin   cyanocobalamin ((VITAMIN B-12)) injection 1,000 mcg  1,000 mcg Intramuscular Q30 days Dettinger, [name], MD   1,000 mcg at 03/18/21 0821    PHYSICAL EXAMINATION: ECOG PERFORMANCE STATUS: 1 - Symptomatic but completely ambulatory  Vitals:   04/04/21 0839  BP: (!) 175/80  Pulse: 77  Resp: 18  Temp: 98.1 F (36.7 C)  SpO2: 100%   Wt Readings from Last 3 Encounters:  04/04/21 118 lb 4.8 oz (53.7 kg)  03/27/21 114 lb 6 oz (51.9 kg)  03/19/21 113 lb 9.6 oz (51.5 kg)     GENERAL:alert, no distress and comfortable SKIN: skin color, texture, turgor are normal, no rashes or significant lesions EYES: normal, Conjunctiva are pink  and non-injected, sclera clear  ABDOMEN:abdomen soft, non-tender and normal bowel sounds Musculoskeletal:no cyanosis of digits and no clubbing  NEURO: alert & oriented x 3 with fluent speech, no focal motor/sensory deficits  LABORATORY DATA:  I have reviewed the data as listed CBC Latest Ref Rng & Units 04/04/2021 02/28/2021 02/27/2021  WBC 4.0 - 10.5 K/uL 6.9 7.9 5.0  Hemoglobin 12.0 - 15.0 g/dL 14.2 11.3(L) 11.7(L)  Hematocrit 36.0 - 46.0 % 43.2 34.7(L) 35.4(L)  Platelets 150 - 400 K/uL 187 230 236     CMP Latest Ref Rng & Units 04/04/2021 03/07/2021  02/28/2021  Glucose 70 - 99 mg/dL 104(H) 92 110(H)  BUN 8 - 23 mg/dL 20 14 26(H)  Creatinine 0.44 - 1.00 mg/dL 0.78 0.71 0.86  Sodium 135 - 145 mmol/L 140 139 137  Potassium 3.5 - 5.1 mmol/L 4.2 4.1 4.0  Chloride 98 - 111 mmol/L 107 103 108  CO2 22 - 32 mmol/L 28 29 21(L)  Calcium 8.9 - 10.3 mg/dL 9.9 9.9 8.7(L)  Total Protein 6.5 - 8.1 g/dL 7.2 6.8 5.6(L)  Total Bilirubin 0.3 - 1.2 mg/dL 1.7(H) 5.5(H) 10.8(H)  Alkaline Phos 38 - 126 U/L 70 238(H) 237(H)  AST 15 - 41 U/L 20 70(H) 57(H)  ALT 0 - 44 U/L 14 78(H) 68(H)      RADIOGRAPHIC STUDIES: I have personally reviewed the radiological images as listed and agreed with the findings in the report. No results found.    No orders of the defined types were placed in this encounter.  All questions were answered. The patient knows to call the clinic with any problems, questions or concerns. No barriers to learning was detected. The total time spent in the appointment was 30 minutes.     Truitt Merle, MD 04/04/2021   I, Wilburn Mylar, am acting as scribe for Truitt Merle, MD.   I have reviewed the above documentation for accuracy and completeness, and I agree with the above.

## 2021-04-05 ENCOUNTER — Ambulatory Visit (HOSPITAL_COMMUNITY)
Admission: RE | Admit: 2021-04-05 | Discharge: 2021-04-05 | Disposition: A | Discharge status: Home / Self Care | Payer: Medicare Other | Source: Ambulatory Visit | Attending: Student | Admitting: Student

## 2021-04-05 ENCOUNTER — Other Ambulatory Visit (HOSPITAL_COMMUNITY): Payer: Self-pay | Admitting: Student

## 2021-04-05 DIAGNOSIS — Z510 Encounter for antineoplastic radiation therapy: Secondary | ICD-10-CM | POA: Diagnosis not present

## 2021-04-05 DIAGNOSIS — C221 Intrahepatic bile duct carcinoma: Secondary | ICD-10-CM | POA: Insufficient documentation

## 2021-04-05 MED ORDER — IOHEXOL 300 MG/ML  SOLN
50.0000 mL | Freq: Once | INTRAMUSCULAR | Status: AC | PRN
  Administered 2021-04-05: 5 mL

## 2021-04-05 MED ORDER — LIDOCAINE HCL 1 % IJ SOLN
INTRAMUSCULAR | Status: AC
  Administered 2021-04-05: 6 mL
  Filled 2021-04-05: qty 20

## 2021-04-05 NOTE — Procedures (Signed)
Interventional Radiology Procedure Note  Procedure: 10 fr int ext biliary drain exchg    Complications: None  Estimated Blood Loss:  0  Findings: Full report in pacs Bag drain 1 day Ext cap tomorrow with 5 cc daily flush    Tamera Punt, MD

## 2021-04-06 ENCOUNTER — Encounter: Payer: Self-pay | Admitting: Hematology

## 2021-04-08 ENCOUNTER — Ambulatory Visit: Payer: Medicare Other | Admitting: Family Medicine

## 2021-04-08 ENCOUNTER — Ambulatory Visit
Admission: RE | Admit: 2021-04-08 | Discharge: 2021-04-08 | Disposition: A | Discharge status: Home / Self Care | Payer: Medicare Other | Source: Ambulatory Visit | Attending: Radiation Oncology | Admitting: Radiation Oncology

## 2021-04-08 ENCOUNTER — Other Ambulatory Visit: Payer: Self-pay

## 2021-04-08 DIAGNOSIS — C221 Intrahepatic bile duct carcinoma: Secondary | ICD-10-CM | POA: Diagnosis not present

## 2021-04-08 DIAGNOSIS — Z510 Encounter for antineoplastic radiation therapy: Secondary | ICD-10-CM | POA: Diagnosis not present

## 2021-04-09 ENCOUNTER — Ambulatory Visit
Admission: RE | Admit: 2021-04-09 | Discharge: 2021-04-09 | Disposition: A | Discharge status: Home / Self Care | Payer: Medicare Other | Source: Ambulatory Visit | Attending: Radiation Oncology | Admitting: Radiation Oncology

## 2021-04-09 DIAGNOSIS — C221 Intrahepatic bile duct carcinoma: Secondary | ICD-10-CM | POA: Diagnosis not present

## 2021-04-09 DIAGNOSIS — Z510 Encounter for antineoplastic radiation therapy: Secondary | ICD-10-CM | POA: Diagnosis not present

## 2021-04-10 ENCOUNTER — Other Ambulatory Visit: Payer: Self-pay

## 2021-04-10 ENCOUNTER — Ambulatory Visit
Admission: RE | Admit: 2021-04-10 | Discharge: 2021-04-10 | Disposition: A | Discharge status: Home / Self Care | Payer: Medicare Other | Source: Ambulatory Visit | Attending: Radiation Oncology | Admitting: Radiation Oncology

## 2021-04-10 DIAGNOSIS — C221 Intrahepatic bile duct carcinoma: Secondary | ICD-10-CM | POA: Insufficient documentation

## 2021-04-10 DIAGNOSIS — Z510 Encounter for antineoplastic radiation therapy: Secondary | ICD-10-CM | POA: Diagnosis not present

## 2021-04-11 ENCOUNTER — Ambulatory Visit
Admission: RE | Admit: 2021-04-11 | Discharge: 2021-04-11 | Disposition: A | Discharge status: Home / Self Care | Payer: Medicare Other | Source: Ambulatory Visit | Attending: Radiation Oncology | Admitting: Radiation Oncology

## 2021-04-11 DIAGNOSIS — Z510 Encounter for antineoplastic radiation therapy: Secondary | ICD-10-CM | POA: Diagnosis not present

## 2021-04-11 DIAGNOSIS — C221 Intrahepatic bile duct carcinoma: Secondary | ICD-10-CM | POA: Diagnosis not present

## 2021-04-12 ENCOUNTER — Ambulatory Visit
Admission: RE | Admit: 2021-04-12 | Discharge: 2021-04-12 | Disposition: A | Discharge status: Home / Self Care | Payer: Medicare Other | Source: Ambulatory Visit | Attending: Radiation Oncology | Admitting: Radiation Oncology

## 2021-04-12 DIAGNOSIS — C221 Intrahepatic bile duct carcinoma: Secondary | ICD-10-CM | POA: Diagnosis not present

## 2021-04-12 DIAGNOSIS — Z510 Encounter for antineoplastic radiation therapy: Secondary | ICD-10-CM | POA: Diagnosis not present

## 2021-04-15 ENCOUNTER — Other Ambulatory Visit: Payer: Self-pay

## 2021-04-15 ENCOUNTER — Ambulatory Visit
Admission: RE | Admit: 2021-04-15 | Discharge: 2021-04-15 | Disposition: A | Discharge status: Home / Self Care | Payer: Medicare Other | Source: Ambulatory Visit | Attending: Radiation Oncology | Admitting: Radiation Oncology

## 2021-04-15 DIAGNOSIS — Z510 Encounter for antineoplastic radiation therapy: Secondary | ICD-10-CM | POA: Diagnosis not present

## 2021-04-15 DIAGNOSIS — C221 Intrahepatic bile duct carcinoma: Secondary | ICD-10-CM | POA: Diagnosis not present

## 2021-04-16 ENCOUNTER — Ambulatory Visit
Admission: RE | Admit: 2021-04-16 | Discharge: 2021-04-16 | Disposition: A | Discharge status: Home / Self Care | Payer: Medicare Other | Source: Ambulatory Visit | Attending: Radiation Oncology | Admitting: Radiation Oncology

## 2021-04-16 ENCOUNTER — Other Ambulatory Visit: Payer: Self-pay | Admitting: Radiation Oncology

## 2021-04-16 DIAGNOSIS — C221 Intrahepatic bile duct carcinoma: Secondary | ICD-10-CM | POA: Diagnosis not present

## 2021-04-16 DIAGNOSIS — Z510 Encounter for antineoplastic radiation therapy: Secondary | ICD-10-CM | POA: Diagnosis not present

## 2021-04-16 MED ORDER — ONDANSETRON HCL 8 MG PO TABS
8.0000 mg | ORAL_TABLET | Freq: Three times a day (TID) | ORAL | 0 refills | Status: DC | PRN
Start: 2021-04-16 — End: 2021-05-06

## 2021-04-17 ENCOUNTER — Other Ambulatory Visit: Payer: Self-pay

## 2021-04-17 ENCOUNTER — Ambulatory Visit
Admission: RE | Admit: 2021-04-17 | Discharge: 2021-04-17 | Disposition: A | Discharge status: Home / Self Care | Payer: Medicare Other | Source: Ambulatory Visit | Attending: Radiation Oncology | Admitting: Radiation Oncology

## 2021-04-17 DIAGNOSIS — C221 Intrahepatic bile duct carcinoma: Secondary | ICD-10-CM | POA: Diagnosis not present

## 2021-04-17 DIAGNOSIS — Z510 Encounter for antineoplastic radiation therapy: Secondary | ICD-10-CM | POA: Diagnosis not present

## 2021-04-18 ENCOUNTER — Ambulatory Visit
Admission: RE | Admit: 2021-04-18 | Discharge: 2021-04-18 | Disposition: A | Discharge status: Home / Self Care | Payer: Medicare Other | Source: Ambulatory Visit | Attending: Radiation Oncology | Admitting: Radiation Oncology

## 2021-04-18 DIAGNOSIS — C221 Intrahepatic bile duct carcinoma: Secondary | ICD-10-CM | POA: Diagnosis not present

## 2021-04-18 DIAGNOSIS — Z510 Encounter for antineoplastic radiation therapy: Secondary | ICD-10-CM | POA: Diagnosis not present

## 2021-04-19 ENCOUNTER — Ambulatory Visit
Admission: RE | Admit: 2021-04-19 | Discharge: 2021-04-19 | Disposition: A | Discharge status: Home / Self Care | Payer: Medicare Other | Source: Ambulatory Visit | Attending: Radiation Oncology | Admitting: Radiation Oncology

## 2021-04-19 ENCOUNTER — Other Ambulatory Visit: Payer: Self-pay

## 2021-04-19 ENCOUNTER — Ambulatory Visit: Payer: Medicare Other

## 2021-04-19 ENCOUNTER — Encounter: Payer: Self-pay | Admitting: Radiation Oncology

## 2021-04-19 DIAGNOSIS — C221 Intrahepatic bile duct carcinoma: Secondary | ICD-10-CM | POA: Diagnosis not present

## 2021-04-19 DIAGNOSIS — Z510 Encounter for antineoplastic radiation therapy: Secondary | ICD-10-CM | POA: Diagnosis not present

## 2021-04-22 NOTE — Progress Notes (Signed)
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 She did develop nausea.   Plan: The patient will receive a call in about one month from the radiation oncology department. She will continue follow up with Dr. Burr Medico as well but may be converting to hospice based care.   ________________________________________________    Carola Rhine, PAC

## 2021-04-23 ENCOUNTER — Ambulatory Visit (INDEPENDENT_AMBULATORY_CARE_PROVIDER_SITE_OTHER): Payer: Medicare Other | Admitting: *Deleted

## 2021-04-23 ENCOUNTER — Telehealth: Payer: Self-pay | Admitting: Hematology

## 2021-04-23 DIAGNOSIS — E538 Deficiency of other specified B group vitamins: Secondary | ICD-10-CM | POA: Diagnosis not present

## 2021-04-23 NOTE — Telephone Encounter (Signed)
Sch per 2/13 inbasket, pt aware

## 2021-04-23 NOTE — Progress Notes (Signed)
Patient in today for monthly B12 injection. 1000 mcg given in right deltoid. Patient tolerated well.

## 2021-04-30 ENCOUNTER — Encounter (INDEPENDENT_AMBULATORY_CARE_PROVIDER_SITE_OTHER): Payer: Self-pay | Admitting: Ophthalmology

## 2021-04-30 ENCOUNTER — Ambulatory Visit (INDEPENDENT_AMBULATORY_CARE_PROVIDER_SITE_OTHER): Payer: Medicare Other | Admitting: Ophthalmology

## 2021-04-30 ENCOUNTER — Other Ambulatory Visit: Payer: Self-pay

## 2021-04-30 DIAGNOSIS — H353221 Exudative age-related macular degeneration, left eye, with active choroidal neovascularization: Secondary | ICD-10-CM | POA: Diagnosis not present

## 2021-04-30 DIAGNOSIS — H353212 Exudative age-related macular degeneration, right eye, with inactive choroidal neovascularization: Secondary | ICD-10-CM

## 2021-04-30 NOTE — Progress Notes (Signed)
04/30/2021     CHIEF COMPLAINT Patient presents for  Chief Complaint  Patient presents with   Macular Degeneration      HISTORY OF PRESENT ILLNESS: Ashley Peters is a 88 y.o. female who presents to the clinic today for:   HPI   6 mos fu ou oct fp. Patient states she feels like her vision has improved over the last 6 months, she thinks she can see better now than before. Glasses are approx. 86 year old. Denies any new floaters or FOL. Pt states she takes AREDS multi vitamin every day twice a day.  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      Referring physician: Dettinger, [name], MD [place],  [place] [address]  HISTORICAL INFORMATION:   Selected notes from the MEDICAL RECORD NUMBER    Lab Results  Component Value Date   HGBA1C 5.7 02/04/2016     CURRENT MEDICATIONS: No current outpatient medications on file. (Ophthalmic Drugs)   No current facility-administered medications for this visit. (Ophthalmic Drugs)   Current Outpatient Medications (Other)  Medication Sig   acetaminophen (TYLENOL) 500 MG tablet Take 2 tablets (1,000 mg total) by mouth every 6 (six) hours as needed for moderate pain. (Patient taking differently: Take 500-1,000 mg by mouth every 6 (six) hours as needed for moderate pain.)   alendronate (FOSAMAX) 70 MG tablet Take with a full glass of water on an empty stomach. (Patient taking differently: Take 70 mg by mouth every Monday. Take with a full glass of water on an empty stomach.)   Cyanocobalamin 1000 MCG/ML KIT Inject 1,000 mcg as directed every 30 (thirty) days.   Multiple Vitamins-Minerals (PRESERVISION AREDS 2 PO) Take 1 capsule by mouth 2 (two) times daily.   ondansetron (ZOFRAN) 8 MG tablet Take 1 tablet (8 mg total) by mouth every 8 (eight)  hours as needed for nausea or vomiting.   oxyCODONE (OXY IR/ROXICODONE) 5 MG immediate release tablet Take 0.5 tablets (2.5 mg total) by mouth every 6 (six) hours as needed for moderate pain or severe pain.   Current Facility-Administered Medications (Other)  Medication Route   cyanocobalamin ((VITAMIN B-12)) injection 1,000 mcg Intramuscular      REVIEW OF SYSTEMS: ROS   Positive for: Gastrointestinal Last edited by Hurman Horn, MD on 04/30/2021  9:03 AM.       ALLERGIES Allergies  Allergen Reactions   Benicar [Olmesartan Medoxomil] Other (See Comments)    Elevated Potassium   Amoxicillin Rash    Has patient had a PCN reaction causing immediate rash, facial/tongue/throat swelling, SOB or lightheadedness with hypotension: Yes Has patient had a PCN reaction causing severe rash involving mucus membranes or skin necrosis: No Has patient had a PCN reaction that required hospitalization: No Has patient had a PCN reaction occurring within the last 10 years: No If all of the above answers are "NO", then may proceed with Cephalosporin use.    PAST MEDICAL HISTORY Past Medical History:  Diagnosis Date   Anemia    Blood transfusion without reported diagnosis    Cancer ([place])    Colon cancer ([place]) dx'd 10/2016   Cystoid macular edema of left eye 07/05/2019   Diverticulosis    Family history of breast cancer    Family history of colon  cancer    Family history of uterine cancer    GERD (gastroesophageal reflux disease)    History of kidney stones    Hyperlipidemia    Hypertension    Macular degeneration    Osteopenia    Past Surgical History:  Procedure Laterality Date   ABDOMINAL HYSTERECTOMY     ablation tr Great saphenous vein  10/17/2009   BILIARY BRUSHING  02/26/2021   Procedure: BILIARY BRUSHING;  Surgeon: Irving Copas., MD;  Location: [facility];  Service: Gastroenterology;;   BIOPSY  02/26/2021   Procedure: BIOPSY;  Surgeon: Irving Copas., MD;   Location: [facility];  Service: Gastroenterology;;   BREAST EXCISIONAL BIOPSY Bilateral 1971   brest biopsy  [place] N/A 10/14/2016   Procedure: REPAIR OF EPIGASTRIC VENTRAL HERNIA;  Surgeon: Donnie Mesa, MD;  Location: [facility];  Service: General;  Laterality: N/A;   ERCP N/A 09/21/2020   Procedure: ENDOSCOPIC RETROGRADE CHOLANGIOPANCREATOGRAPHY (ERCP);  Surgeon: Gatha Mayer, MD;  Location: [facility];  Service: Endoscopy;  Laterality: N/A;   ERCP N/A 02/26/2021   Procedure: ENDOSCOPIC RETROGRADE CHOLANGIOPANCREATOGRAPHY (ERCP);  Surgeon: Irving Copas., MD;  Location: [facility];  Service: Gastroenterology;  Laterality: N/A;   IR PERCUTANEOUS TRANSHEPATIC CHOLANGIOGRAM  02/27/2021   lt. distal ureteral stone extraction  09/14/1991   PARTIAL COLECTOMY Right 10/14/2016   Procedure: OPEN RIGHT HEMICOLECTOMY;  Surgeon: Donnie Mesa, MD;  Location: [facility];  Service: General;  Laterality: Right;   REMOVAL OF STONES  09/21/2020   Procedure: REMOVAL OF STONES;  Surgeon: Gatha Mayer, MD;  Location: [facility];  Service: Endoscopy;;   REMOVAL OF STONES  02/26/2021   Procedure: REMOVAL OF SLUDGE;  Surgeon: Irving Copas., MD;  Location: [facility];  Service: Gastroenterology;;   [name] Mayans  09/21/2020   Procedure: [name] Mayans;  Surgeon: Gatha Mayer, MD;  Location: [facility];  Service: Endoscopy;;   SPHINCTEROTOMY  02/26/2021   Procedure: [name] Mayans;  Surgeon: Mansouraty, Telford Nab., MD;  Location: [facility];  Service: Gastroenterology;;   [facility] CHOLANGIOSCOPY N/A 02/26/2021   Procedure: CHYIFOYD CHOLANGIOSCOPY;  Surgeon: Irving Copas., MD;  Location: [facility];  Service: Gastroenterology;  Laterality: N/A;   STONE EXTRACTION WITH BASKET  09/21/2020   Procedure: STONE EXTRACTION WITH BASKET;  Surgeon: Gatha Mayer, MD;  Location: [facility];  Service: Endoscopy;;    FAMILY  HISTORY Family History  Problem Relation Age of Onset   Stroke Father 55   Breast cancer Sister        dx 25's, died at 81   Seizures Son    Diabetes Brother    Kidney disease Brother    Heart disease Brother    Epilepsy Son    Colon cancer Other 78   Uterine cancer Other 51   Hodgkin's lymphoma Other    Breast cancer Other     SOCIAL HISTORY Social History   Tobacco Use   Smoking status: Never   Smokeless tobacco: Never  Vaping Use   Vaping Use: Never used  Substance Use Topics   Alcohol use: No   Drug use: No         OPHTHALMIC EXAM:  Base Eye Exam     Visual Acuity (ETDRS)       Right Left   Dist cc CF at 3' 20/60 +2   Dist ph cc  NI    Correction: Glasses  Tonometry (Tonopen, 8:52 AM)       Right Left   Pressure 21 16         Pupils       Pupils Dark Light   Right PERRL 5 4   Left PERRL 5 4         Extraocular Movement       Right Left    Full Full         Neuro/Psych     Oriented x3: Yes   Mood/Affect: Normal         Dilation     Both eyes: 1.0% Mydriacyl, 2.5% Phenylephrine @ 8:49 AM           Slit Lamp and Fundus Exam     External Exam       Right Left   External Normal Normal         Slit Lamp Exam       Right Left   Lids/Lashes Normal Normal   Conjunctiva/Sclera White and quiet White and quiet   Cornea Clear Clear   Anterior Chamber Deep and quiet Deep and quiet   Iris Round and reactive Round and reactive   Lens Posterior chamber intraocular lens Posterior chamber intraocular lens   Anterior Vitreous Normal Normal         Fundus Exam       Right Left   Posterior Vitreous Posterior vitreous detachment Posterior vitreous detachment   Disc Normal Normal   C/D Ratio 0.45 0.5   Macula Retinal pigment epithelial mottling, Hard drusen, Geographic atrophy into the FAZ, Advanced age related macular degeneration, Geographic atrophy encompasses the FAZ approximately 5 DA size, no macular  thickening, no hemorrhage, no exudates, Disciform scar Retinal pigment epithelial mottling, Hard drusen, Pigmented atrophy, Geographic atrophy, now with subfoveal atrophy approximately 6 DA size with very small island of foveal avascular zone remaining centrally which might explain the acuity OS   Vessels Normal Normal   Periphery Normal Normal            IMAGING AND PROCEDURES  Imaging and Procedures for 04/30/21  OCT, Retina - OU - Both Eyes       Right Eye Quality was good. Scan locations included subfoveal. Central Foveal Thickness: 282. Progression has been stable. Findings include abnormal foveal contour, no SRF, no IRF, outer retinal atrophy, central retinal atrophy, subretinal hyper-reflective material.   Left Eye Quality was good. Scan locations included subfoveal. Central Foveal Thickness: 239. Progression has been stable. Findings include abnormal foveal contour, outer retinal atrophy, central retinal atrophy, subretinal scarring, subretinal hyper-reflective material.   Notes Extensive outer retinal atrophy, with loss of photoreceptor layer centrally in each eye with no signs of intraretinal fluid or perimacular extension of CNVM in either eye. No signs of CNVM in either eye  Subfoveal fibrosis present OS, no active CNVM     Color Fundus Photography Optos - OU - Both Eyes       Right Eye Progression has been stable. Disc findings include normal observations. Macula : geographic atrophy, retinal pigment epithelium abnormalities. Vessels : normal observations. Periphery : normal observations.   Left Eye Progression has been stable. Disc findings include normal observations. Macula : geographic atrophy, retinal pigment epithelium abnormalities. Vessels : normal observations. Periphery : normal observations.   Notes OD with extensive geographic atrophy as well as no signs of CNVM.  OS also with geographic atrophy approximately 5-6 disc areas in extent .  No signs of  CNVM OS  ASSESSMENT/PLAN:  No problem-specific Assessment & Plan notes found for this encounter.      ICD-10-CM   1. Exudative age-related macular degeneration of left eye with active choroidal neovascularization (HCC)  H35.3221 OCT, Retina - OU - Both Eyes    Color Fundus Photography Optos - OU - Both Eyes    2. Exudative age-related macular degeneration of right eye with inactive choroidal neovascularization (HCC)  H35.3212 OCT, Retina - OU - Both Eyes    Color Fundus Photography Optos - OU - Both Eyes      1.  OU, with dry AMD, no sign of CNVM OD with subfoveal atrophy limiting acuity  2.  OS today with small remnant of aphasia intact.  No signs of active CNVM  3.  Ophthalmic Meds Ordered this visit:  No orders of the defined types were placed in this encounter.      Return in about 6 months (around 10/28/2021) for DILATE OU, OCT, COLOR FP.  There are no Patient Instructions on file for this visit.   Explained the diagnoses, plan, and follow up with the patient and they expressed understanding.  Patient expressed understanding of the importance of proper follow up care.   Clent Demark Darreld Hoffer M.D. Diseases & Surgery of the Retina and Vitreous [facility] [place] 04/30/21     Abbreviations: M myopia (nearsighted); A astigmatism; H hyperopia (farsighted); P presbyopia; Mrx spectacle prescription;  CTL contact lenses; OD right eye; OS left eye; OU both eyes  XT exotropia; ET esotropia; PEK punctate epithelial keratitis; PEE punctate epithelial erosions; DES dry eye syndrome; MGD meibomian gland dysfunction; ATs artificial tears; PFAT's preservative free artificial tears; [place] nuclear sclerotic cataract; PSC posterior subcapsular cataract; ERM epi-retinal membrane; PVD posterior vitreous detachment; RD retinal detachment; DM diabetes mellitus; DR diabetic retinopathy; NPDR non-proliferative diabetic retinopathy; PDR proliferative diabetic retinopathy;  CSME clinically significant macular edema; DME diabetic macular edema; dbh dot blot hemorrhages; CWS cotton wool spot; POAG primary open angle glaucoma; C/D cup-to-disc ratio; HVF humphrey visual field; GVF goldmann visual field; OCT optical coherence tomography; IOP intraocular pressure; BRVO Branch retinal vein occlusion; CRVO central retinal vein occlusion; CRAO central retinal artery occlusion; BRAO branch retinal artery occlusion; RT retinal tear; SB scleral buckle; PPV pars plana vitrectomy; VH Vitreous hemorrhage; PRP panretinal laser photocoagulation; IVK intravitreal kenalog; VMT vitreomacular traction; MH Macular hole;  NVD neovascularization of the disc; NVE neovascularization elsewhere; AREDS age related eye disease study; ARMD age related macular degeneration; POAG primary open angle glaucoma; EBMD epithelial/anterior basement membrane dystrophy; ACIOL anterior chamber intraocular lens; IOL intraocular lens; PCIOL posterior chamber intraocular lens; Phaco/IOL phacoemulsification with intraocular lens placement; [place] photorefractive keratectomy; LASIK laser assisted in situ keratomileusis; HTN hypertension; DM diabetes mellitus; COPD chronic obstructive pulmonary disease

## 2021-05-06 ENCOUNTER — Encounter: Payer: Self-pay | Admitting: Family Medicine

## 2021-05-06 ENCOUNTER — Ambulatory Visit (INDEPENDENT_AMBULATORY_CARE_PROVIDER_SITE_OTHER): Payer: Medicare Other | Admitting: Family Medicine

## 2021-05-06 VITALS — BP 139/77 | HR 88 | Ht 60.0 in | Wt 110.0 lb

## 2021-05-06 DIAGNOSIS — M810 Age-related osteoporosis without current pathological fracture: Secondary | ICD-10-CM | POA: Diagnosis not present

## 2021-05-06 DIAGNOSIS — K219 Gastro-esophageal reflux disease without esophagitis: Secondary | ICD-10-CM

## 2021-05-06 DIAGNOSIS — E782 Mixed hyperlipidemia: Secondary | ICD-10-CM | POA: Diagnosis not present

## 2021-05-06 DIAGNOSIS — I10 Essential (primary) hypertension: Secondary | ICD-10-CM | POA: Diagnosis not present

## 2021-05-06 DIAGNOSIS — C240 Malignant neoplasm of extrahepatic bile duct: Secondary | ICD-10-CM

## 2021-05-06 MED ORDER — ONDANSETRON HCL 8 MG PO TABS: 8.0000 mg | ORAL_TABLET | Freq: Three times a day (TID) | ORAL | 3 refills | Status: AC | PRN

## 2021-05-06 MED ORDER — ALENDRONATE SODIUM 70 MG PO TABS: ORAL_TABLET | ORAL | 3 refills | Status: AC

## 2021-05-06 NOTE — Progress Notes (Signed)
BP 139/77    Pulse 88    Ht 5' (1.524 m)    Wt 110 lb (49.9 kg)    SpO2 95%    BMI 21.48 kg/m    Subjective:   Patient ID: [name], female    DOB: 1929-02-12, 88 y.o.   MRN: [id number]  HPI: KESHAWN SUNDBERG is a 88 y.o. female presenting on 05/06/2021 for Medical Management of Chronic Issues, Hyperlipidemia, and Hypertension   HPI Hypertension Patient is currently on no medication currently, has been diet controlled since she has been fighting her cancer and radiation treatments., and their blood pressure today is 139/77. Patient denies any lightheadedness or dizziness. Patient denies headaches, blurred vision, chest pains, shortness of breath, or weakness. Denies any side effects from medication and is content with current medication.   Hyperlipidemia Patient is coming in for recheck of his hyperlipidemia. The patient is currently taking no medication currently, diet controlled. They deny any issues with myalgias or history of liver damage from it. They deny any focal numbness or weakness or chest pain.   GERD Patient is currently on no medication currently.  She denies any major symptoms or abdominal pain or belching or burping. She denies any blood in her stool or lightheadedness or dizziness.   Patient does have some nausea associated with tumor that is causing cholangiocarcinoma which is a metastasis from her colon cancer.  Relevant past medical, surgical, family and social history reviewed and updated as indicated. Interim medical history since our last visit reviewed. Allergies and medications reviewed and updated.  Review of Systems  Constitutional:  Negative for chills and fever.  Eyes:  Negative for visual disturbance.  Respiratory:  Negative for chest tightness and shortness of breath.   Cardiovascular:  Negative for chest pain and leg swelling.  Musculoskeletal:  Negative for back pain and gait problem.  Skin:  Negative for rash.  Neurological:  Negative for  light-headedness and headaches.  Psychiatric/Behavioral:  Negative for agitation and behavioral problems.   All other systems reviewed and are negative.  Per HPI unless specifically indicated above   Allergies as of 05/06/2021       Reactions   Benicar [olmesartan Medoxomil] Other (See Comments)   Elevated Potassium   Amoxicillin Rash   Has patient had a PCN reaction causing immediate rash, facial/tongue/throat swelling, SOB or lightheadedness with hypotension: Yes Has patient had a PCN reaction causing severe rash involving mucus membranes or skin necrosis: No Has patient had a PCN reaction that required hospitalization: No Has patient had a PCN reaction occurring within the last 10 years: No If all of the above answers are "NO", then may proceed with Cephalosporin use.        Medication List        Accurate as of May 06, 2021 12:04 PM. If you have any questions, ask your nurse or doctor.          STOP taking these medications    oxyCODONE 5 MG immediate release tablet Commonly known as: Oxy IR/ROXICODONE Stopped by: [name] Dettinger, MD       TAKE these medications    acetaminophen 500 MG tablet Commonly known as: TYLENOL Take 2 tablets (1,000 mg total) by mouth every 6 (six) hours as needed for moderate pain. What changed: how much to take   alendronate 70 MG tablet Commonly known as: FOSAMAX Take with a full glass of water on an empty stomach. What changed:  how much to  take how to take this when to take this   Cyanocobalamin 1000 MCG/ML Kit Inject 1,000 mcg as directed every 30 (thirty) days.   ondansetron 8 MG tablet Commonly known as: ZOFRAN Take 1 tablet (8 mg total) by mouth every 8 (eight) hours as needed for nausea or vomiting.   PRESERVISION AREDS 2 PO Take 1 capsule by mouth 2 (two) times daily.         Objective:   BP 139/77    Pulse 88    Ht 5' (1.524 m)    Wt 110 lb (49.9 kg)    SpO2 95%    BMI 21.48 kg/m   Wt Readings  from Last 3 Encounters:  05/06/21 110 lb (49.9 kg)  04/04/21 118 lb 4.8 oz (53.7 kg)  03/27/21 114 lb 6 oz (51.9 kg)    Physical Exam Vitals and nursing note reviewed.  Constitutional:      General: She is not in acute distress.    Appearance: She is well-developed. She is not diaphoretic.  Eyes:     Conjunctiva/sclera: Conjunctivae normal.  Cardiovascular:     Rate and Rhythm: Normal rate and regular rhythm.     Heart sounds: Normal heart sounds. No murmur heard. Pulmonary:     Effort: Pulmonary effort is normal. No respiratory distress.     Breath sounds: Normal breath sounds. No wheezing.  Musculoskeletal:        General: No tenderness. Normal range of motion.  Skin:    General: Skin is warm and dry.     Findings: No rash.  Neurological:     Mental Status: She is alert and oriented to person, place, and time.     Coordination: Coordination normal.  Psychiatric:        Behavior: Behavior normal.   Assessment & Plan:   Problem List Items Addressed This Visit       Cardiovascular and Mediastinum   HTN (hypertension) - Primary   Relevant Medications   ondansetron (ZOFRAN) 8 MG tablet   Other Relevant Orders   CBC with Differential/Platelet   CMP14+EGFR   Lipid panel   TSH     Digestive   GERD (gastroesophageal reflux disease)   Relevant Medications   ondansetron (ZOFRAN) 8 MG tablet   Other Relevant Orders   CBC with Differential/Platelet   CMP14+EGFR   Hilar cholangiocarcinoma (HCC)   Relevant Medications   ondansetron (ZOFRAN) 8 MG tablet     Musculoskeletal and Integument   Age-related osteoporosis without current pathological fracture   Relevant Medications   alendronate (FOSAMAX) 70 MG tablet     Other   Hyperlipidemia   Relevant Orders   Lipid panel    Continue current medicine.,  Will put in for blood work to have done with her oncology blood work.  No changes from [place] today. Follow up plan: Return in about 6 months (around 11/03/2021), or if  symptoms worsen or fail to improve, for Hyperlipidemia and hypertension recheck.  Counseling provided for all of the vaccine components Orders Placed This Encounter  Procedures   CBC with Differential/Platelet   CMP14+EGFR   Lipid panel   TSH    Caryl Pina, MD [facility] 05/06/2021, 12:04 PM

## 2021-05-20 ENCOUNTER — Ambulatory Visit
Admission: RE | Admit: 2021-05-20 | Discharge: 2021-05-20 | Disposition: A | Discharge status: Home / Self Care | Payer: Medicare Other | Source: Ambulatory Visit | Attending: Radiation Oncology | Admitting: Radiation Oncology

## 2021-05-20 DIAGNOSIS — C221 Intrahepatic bile duct carcinoma: Secondary | ICD-10-CM | POA: Insufficient documentation

## 2021-05-21 ENCOUNTER — Ambulatory Visit (INDEPENDENT_AMBULATORY_CARE_PROVIDER_SITE_OTHER): Payer: Medicare Other

## 2021-05-21 DIAGNOSIS — E538 Deficiency of other specified B group vitamins: Secondary | ICD-10-CM | POA: Diagnosis not present

## 2021-05-21 NOTE — Progress Notes (Signed)
Cyanocobalamin injection given to left deltoid.  Patient tolerated well. 

## 2021-05-22 ENCOUNTER — Inpatient Hospital Stay: Payer: Medicare Other | Admitting: Hematology

## 2021-05-22 ENCOUNTER — Inpatient Hospital Stay: Payer: Medicare Other

## 2021-05-27 NOTE — Progress Notes (Signed)
?  Radiation Oncology         [phone] ?________________________________ ? ?Name: Ashley Peters MRN: [id number]  ?Date of Service: 05/20/2021  DOB: 1928-10-22 ? ?Post Treatment Telephone Note ? ?Diagnosis:   Locally advanced intrahepatic cholangiocarcinoma ? ?Intent: Curative ? ?Radiation Treatment Dates:  ?04/08/2021 through 04/19/2021 ?Site Technique Total Dose (Gy) Dose per Fx (Gy) Completed Fx Beam Energies  ?Liver: Liver IMRT 60/60 6 10/10 [id number]  ? ?Narrative: The patient tolerated radiation therapy relatively well. She did develop nausea. Her daughter states she's felt a bit tired and some discomfort in her stomach like she's hungry and this comes and goes. Eating seems to alleviate this. She is going to follow up with IR this week also as well as seeing Dr. Burr Medico. ? ?Impression/Plan: ?1. Locally advanced intrahepatic cholangiocarcinoma. The patient has been doing well since completion of radiotherapy. We discussed that we would be happy to continue to follow her as needed, but she will also continue to follow up with Dr. Burr Medico in medical oncology and interventional radiology.   ? ? ? ? ?Carola Rhine, PAC  ? ? ? ? ?

## 2021-05-31 ENCOUNTER — Inpatient Hospital Stay (HOSPITAL_BASED_OUTPATIENT_CLINIC_OR_DEPARTMENT_OTHER): Payer: Medicare Other | Admitting: Hematology

## 2021-05-31 ENCOUNTER — Inpatient Hospital Stay: Payer: Medicare Other | Attending: Hematology

## 2021-05-31 ENCOUNTER — Other Ambulatory Visit (HOSPITAL_COMMUNITY): Payer: Self-pay | Admitting: Interventional Radiology

## 2021-05-31 ENCOUNTER — Other Ambulatory Visit: Payer: Self-pay

## 2021-05-31 ENCOUNTER — Ambulatory Visit (HOSPITAL_COMMUNITY)
Admission: RE | Admit: 2021-05-31 | Discharge: 2021-05-31 | Disposition: A | Discharge status: Home / Self Care | Payer: Medicare Other | Source: Ambulatory Visit | Attending: Student | Admitting: Student

## 2021-05-31 VITALS — BP 128/71 | HR 80 | Temp 98.4°F | Resp 16 | Ht 60.0 in | Wt 108.6 lb

## 2021-05-31 DIAGNOSIS — D649 Anemia, unspecified: Secondary | ICD-10-CM | POA: Insufficient documentation

## 2021-05-31 DIAGNOSIS — C221 Intrahepatic bile duct carcinoma: Secondary | ICD-10-CM | POA: Insufficient documentation

## 2021-05-31 DIAGNOSIS — Z434 Encounter for attention to other artificial openings of digestive tract: Secondary | ICD-10-CM | POA: Insufficient documentation

## 2021-05-31 DIAGNOSIS — Z85038 Personal history of other malignant neoplasm of large intestine: Secondary | ICD-10-CM | POA: Insufficient documentation

## 2021-05-31 DIAGNOSIS — C182 Malignant neoplasm of ascending colon: Secondary | ICD-10-CM

## 2021-05-31 DIAGNOSIS — E538 Deficiency of other specified B group vitamins: Secondary | ICD-10-CM | POA: Insufficient documentation

## 2021-05-31 DIAGNOSIS — C240 Malignant neoplasm of extrahepatic bile duct: Secondary | ICD-10-CM | POA: Diagnosis not present

## 2021-05-31 DIAGNOSIS — D500 Iron deficiency anemia secondary to blood loss (chronic): Secondary | ICD-10-CM

## 2021-05-31 DIAGNOSIS — C787 Secondary malignant neoplasm of liver and intrahepatic bile duct: Secondary | ICD-10-CM | POA: Diagnosis not present

## 2021-05-31 HISTORY — PX: IR CATHETER TUBE CHANGE: IMG717

## 2021-05-31 HISTORY — PX: IR EXCHANGE BILIARY DRAIN: IMG6046

## 2021-05-31 LAB — COMPREHENSIVE METABOLIC PANEL
ALT: 18 U/L (ref 0–44)
AST: 22 U/L (ref 15–41)
Albumin: 3.9 g/dL (ref 3.5–5.0)
Alkaline Phosphatase: 71 U/L (ref 38–126)
Anion gap: 5 (ref 5–15)
BUN: 20 mg/dL (ref 8–23)
CO2: 30 mmol/L (ref 22–32)
Calcium: 10.1 mg/dL (ref 8.9–10.3)
Chloride: 103 mmol/L (ref 98–111)
Creatinine, Ser: 0.96 mg/dL (ref 0.44–1.00)
GFR, Estimated: 56 mL/min — ABNORMAL LOW (ref >60)
Glucose, Bld: 91 mg/dL (ref 70–99)
Potassium: 4.6 mmol/L (ref 3.5–5.1)
Sodium: 138 mmol/L (ref 135–145)
Total Bilirubin: 0.9 mg/dL (ref 0.3–1.2)
Total Protein: 6.7 g/dL (ref 6.5–8.1)

## 2021-05-31 LAB — CBC WITH DIFFERENTIAL/PLATELET
Abs Immature Granulocytes: 0.03 10*3/uL (ref 0.00–0.07)
Basophils Absolute: 0 10*3/uL (ref 0.0–0.1)
Basophils Relative: 1 %
Eosinophils Absolute: 0.1 10*3/uL (ref 0.0–0.5)
Eosinophils Relative: 1 %
HCT: 45.5 % (ref 36.0–46.0)
Hemoglobin: 15.5 g/dL — ABNORMAL HIGH (ref 12.0–15.0)
Immature Granulocytes: 1 %
Lymphocytes Relative: 15 %
Lymphs Abs: 1 10*3/uL (ref 0.7–4.0)
MCH: 30 pg (ref 26.0–34.0)
MCHC: 34.1 g/dL (ref 30.0–36.0)
MCV: 88.2 fL (ref 80.0–100.0)
Monocytes Absolute: 0.5 10*3/uL (ref 0.1–1.0)
Monocytes Relative: 8 %
Neutro Abs: 4.8 10*3/uL (ref 1.7–7.7)
Neutrophils Relative %: 74 %
Platelets: 200 10*3/uL (ref 150–400)
RBC: 5.16 MIL/uL — ABNORMAL HIGH (ref 3.87–5.11)
RDW: 12.9 % (ref 11.5–15.5)
WBC: 6.4 10*3/uL (ref 4.0–10.5)
nRBC: 0 % (ref 0.0–0.2)

## 2021-05-31 LAB — FERRITIN: Ferritin: 191 ng/mL (ref 11–307)

## 2021-05-31 LAB — CEA (IN HOUSE-CHCC): CEA (CHCC-In House): 3.64 ng/mL (ref 0.00–5.00)

## 2021-05-31 MED ORDER — LIDOCAINE HCL 1 % IJ SOLN
INTRAMUSCULAR | Status: AC
  Filled 2021-05-31: qty 20

## 2021-05-31 MED ORDER — IOHEXOL 300 MG/ML  SOLN
100.0000 mL | Freq: Once | INTRAMUSCULAR | Status: AC | PRN
  Administered 2021-05-31: 12 mL via INTRA_ARTERIAL

## 2021-05-31 MED ORDER — PROCHLORPERAZINE MALEATE 5 MG PO TABS: 5.0000 mg | ORAL_TABLET | Freq: Four times a day (QID) | ORAL | 0 refills | Status: DC | PRN

## 2021-05-31 NOTE — Progress Notes (Addendum)
?[name]   ?[phone] [phone]   ?Clinic Follow up Note  ? ?Patient Care Team: ?Dettinger, [name], MD as PCP - General (Family Medicine) ?Rankin, Clent Demark, MD as Consulting Physician (Ophthalmology) ?Donnie Mesa, MD as Consulting Physician (General Surgery) ?Truitt Merle, MD as Consulting Physician (Hematology) ? ?Date of Service:  05/31/2021 ? ?CHIEF COMPLAINT: f/u of cholangiocarcinoma, h/o colon cancer ? ?CURRENT THERAPY:  ?Surveillance ? ?ASSESSMENT & PLAN:  ?Ashley Peters is a 88 y.o. female with  ? ?1. Hilar Cholangiocarcinoma, cTxN1M0 ?-presented with rising T bili level. Abdomen MRI on 02/23/21 showed: enlarging malignant liver lesion, now invasive of bile ducts extending distally into common bile duct causing obstruction; probable malignant lymph nodes.  ?-She was referred to the ED on 02/25/21, T bili at that time was 18.4. Emergent ERCP was done on 02/26/21 by Dr. Rush Landmark. Biopsy of common bile duct confirmed poorly differentiated carcinoma. ?-staging chest CT on 04/01/21 showed: ill-defined nodules in right lung, indeterminate, largest is 9 mm in RML. ?-she received 10 fractions of SBRT under Dr. Lisbeth Renshaw 1/30-2/10/23. ?-she is recovering well from radiation. Labs reviewed, hgb slightly elevated at 15.5.  ?-she is scheduled for f/u with IR later today for her draining tube exchange. ?-plan to repeat MRI in 6 weeks to evaluate her response to radiation. ?  ?2. Cancer of the right colon, invasive adenocarcinoma, G1, pT4bN0Mx stage IIA, MSI-stable  ?-Diagnosed in 09/2016. Genetic testing was negative. Treated with right hemicolectomy and adjuvant Xeloda. Currently on surveillance. ?-She had a colonoscopy on 12/23/2017 with a few polyps removed. Due to her advanced age, no further surveillance colonoscopy was recommended.  ?-MRI abdomen 12/04/20 showed: no significant change in heterogeneously enhancing lesions in anterior right liver lobe. ?   ?3. B12 deficiency   ?-She currently receives monthly B12 injections with PCP.  ?  ?  ?Plan  ?-f/u with IR today for PTC tube exchange ?-f/u in 6 weeks with lab and MRI several days before ? ? ?No problem-specific Assessment & Plan notes found for this encounter. ? ? ?SUMMARY OF ONCOLOGIC HISTORY: ?Oncology History Overview Note  ? Cancer Staging  ?Cancer of right colon ([place]) ?Staging form: Colon and Rectum, AJCC 8th Edition ?- Pathologic stage from 10/14/2016: Stage IIC (pT4b, pN0, cM0) - Signed by Truitt Merle, MD on 11/07/2016 ?Total positive nodes: 0 ?Tumor deposits (TD): Absent ?Perineural invasion (PNI): Absent ?Microsatellite instability (MSI): Stable ? ?Hilar cholangiocarcinoma ([place]) ?Staging form: Distal Bile Duct, AJCC 8th Edition ?- Clinical stage from 03/08/2021: Stage Unknown (cTX, cN1, cM0) - Signed by Truitt Merle, MD on 03/08/2021 ? ? ?  ?Cancer of right colon ([place])  ?09/19/2016 Imaging  ? CT A/P 09/19/16 ?IMPRESSION: ?Large 9.5 cm intraluminal mass in the cecum and ascending colon, ?consistent with colon carcinoma. Tumor extension into adjacent ?pericolonic fat seen along the lateral wall. ?  ?Mild right pericolonic and mesenteric lymphadenopathy, suspicious ?for metastatic disease. No other sites of metastatic disease ?identified. ?  ?Colonic diverticulosis, without radiographic evidence of ?diverticulitis. Tiny hiatal hernia, and small epigastric ventral ?hernia containing only fat. ?  ?Aortic atherosclerosis. ?  ?10/14/2016 Initial Diagnosis  ? Cancer of right colon [facility]) ?  ?10/14/2016 Surgery  ? OPEN RIGHT HEMICOLECTOMY and REPAIR OF EPIGASTRIC VENTRAL HERNIA by Dr. Georgette Dover and Dr. Dalbert Batman  ?  ?10/14/2016 Pathology Results  ? 10/14/16 ?Diagnosis ?Colon, segmental resection for tumor, Right ?ADENOCARCINOMA WITH EXTENSIVE EXTRA CELLULAR MUCIN, GRADE 1, (11.0 CM) ?THE TUMOR INVADES THROUGH THE CECUM WALL  INTO ADJACENT TERMINAL ILEUM (PT4B) ?NINETEEN BENIGN LYMPH NODES (0/19) ?SUPPURATIVE INFLAMMATION WITH FIBROSIS AND ADHESION TO  ABDOMINAL WALL ?NO ADENOCARCINOMA IDENTIFIED ?  ?11/19/2016 Imaging  ? CT Chest WO Contrast 11/19/16 ?IMPRESSION: ?1. No evidence of metastatic disease. ?2. Aortic atherosclerosis (ICD10-170.0). Coronary artery ?calcification. ?  ?11/25/2016 - 03/24/2017 Chemotherapy  ? Adjuvant Xeloda two weeks on, one week off starting 11/25/16 for 3 months  ? ?  ?12/21/2016 Genetic Testing  ? Patient had genetic testing due to a personal history of colon cancer and a family history of breast, uterine, and colon cancer. The Common Hereditary Cancers Panel was ordered.  The Hereditary Gene Panel offered by [facility] includes sequencing and/or deletion duplication testing of the following 47 genes: APC, ATM, AXIN2, BARD1, BMPR1A, BRCA1, BRCA2, BRIP1, CDH1, CDKN2A (p14ARF), CDKN2A (p16INK4a), CHEK2, CDK4, CTNNA1, DICER1, EPCAM (Deletion/duplication testing only), GREM1 (promoter region deletion/duplication testing only), KIT, MEN1, MLH1, MSH2, MSH3, MSH6, MUTYH, NBN, NF1, NHTL1, PALB2, PDGFRA, PMS2, POLD1, POLE, PTEN, RAD50, RAD51C, RAD51D, SDHB, SDHC, SDHD, SMAD4, SMARCA4. STK11, TP53, TSC1, TSC2, and VHL.  The following genes were evaluated for sequence changes only: SDHA and HOXB13 c.251G>A variant only. ? ?Results: Negative- no pathogenic variants identified.  The date of this test report is 12/21/2016.  ?  ?04/27/2017 Imaging  ? CT AP W Contrast 04/27/17 ?IMPRESSION: ?Status post right hemicolectomy. No evidence of recurrent or metastatic disease. Additional stable ancillary findings as above. ?  ?08/07/2017 Imaging  ? 08/07/2017 DEXA ?ASSESSMENT: ?The BMD measured at Forearm Radius 33% is 0.543 g/cm2 with a T-score of -3.9. This patient is considered osteoporotic according to [facility]) criteria. ?  ?11/25/2017 Imaging  ?  ?11/25/2017 CT CAP ?IMPRESSION: ?1. Abnormal appearance of the right lobe of the liver, with ?development of progressive peripheral intrahepatic duct dilatation ?with ill definition of the more  central right hepatic duct. This ?appearance is indeterminate. Considerations include an incidental ?primary liver lesion such as cholangiocarcinoma, an otherwise occult ?liver metastasis with secondary biliary duct dilatation, or ?infectious/inflammatory cholangitis. If this 88 year old can undergo ?MRI/MRCP, this should be considered. Multiphase CT (with delayed ?phase imaging to evaluate for possible cholangiocarcinoma) and/or ?focused ultrasound would be alternate imaging strategies. ?2. Otherwise, no evidence of metastatic disease in the chest, abdomen, or pelvis. ?3. Coronary artery atherosclerosis. Aortic Atherosclerosis ?(ICD10-I70.0). ?  ?12/10/2017 Imaging  ? 12/10/2017 MRI Abdomen ?IMPRESSION: ?1. Stricturing of the central bile ducts of segment V RIGHT hepatic ?lobe without clear lesion identified. Differential would include ?benign and malignant stricturing including cholangiocarcinoma. ?Evaluation is hampered by clip artifact from cholecystectomy clips ?and duodenum gas. FDG PET scan may or may not be helpful in identify ?lesion. ERCP would be a second option to evaluate this central ?obstruction. ?2. No enhancing lesion the in the liver parenchyma parenchyma. ?Altered perfusion related to the biliary obstruction. ?  ?12/23/2017 Procedure  ? 12/23/2017 Colonoscopy ?Impressions: ?- Decreased sphincter tone found on digital rectal exam. ?- Two 3 mm polyps in the rectum and in the descending colon, removed with a cold snare. ?Resected and retrieved. ?- One 8 mm polyp in the rectum, removed with a hot snare. Resected and retrieved. ?- Diverticulosis in the entire examined colon. ?- The examination was otherwise normal on direct and retroflexion views. ?  ?08/24/2018 Imaging  ? CT AP  ?IMPRESSION: ?1. Over the last 9 months there has been a similar not significantly ?progressive appearance of biliary dilatation and complexity in ?segment 5 of the liver along with mild intrahepatic biliary ?dilatation  elsewhere, and moderate extrahepatic biliary dilatation. ?Some of this is likely postinflammatory; the most complex and ?dilated bile duct did not have definite enhancement on the recent ?MRI to definitively indicate cholang

## 2021-06-01 ENCOUNTER — Encounter: Payer: Self-pay | Admitting: Hematology

## 2021-06-04 ENCOUNTER — Other Ambulatory Visit (HOSPITAL_COMMUNITY): Payer: Self-pay

## 2021-06-04 MED ORDER — NORMAL SALINE FLUSH 0.9 % IV SOLN
INTRAVENOUS | 3 refills | Status: DC
  Filled 2021-06-04: qty 300, 30d supply, fill #0
  Filled 2021-07-10: qty 300, 30d supply, fill #1
  Filled 2021-08-11: qty 300, 30d supply, fill #2
  Filled 2021-09-11: qty 300, 30d supply, fill #3

## 2021-06-11 ENCOUNTER — Other Ambulatory Visit (HOSPITAL_COMMUNITY): Payer: Self-pay | Admitting: Student

## 2021-06-11 ENCOUNTER — Encounter (HOSPITAL_COMMUNITY): Payer: Self-pay | Admitting: Radiology

## 2021-06-11 DIAGNOSIS — C221 Intrahepatic bile duct carcinoma: Secondary | ICD-10-CM

## 2021-06-20 DIAGNOSIS — H04123 Dry eye syndrome of bilateral lacrimal glands: Secondary | ICD-10-CM | POA: Diagnosis not present

## 2021-06-20 DIAGNOSIS — H353231 Exudative age-related macular degeneration, bilateral, with active choroidal neovascularization: Secondary | ICD-10-CM | POA: Diagnosis not present

## 2021-06-20 DIAGNOSIS — Z961 Presence of intraocular lens: Secondary | ICD-10-CM | POA: Diagnosis not present

## 2021-06-25 ENCOUNTER — Ambulatory Visit (INDEPENDENT_AMBULATORY_CARE_PROVIDER_SITE_OTHER): Payer: Medicare Other | Admitting: Emergency Medicine

## 2021-06-25 DIAGNOSIS — E538 Deficiency of other specified B group vitamins: Secondary | ICD-10-CM | POA: Diagnosis not present

## 2021-07-10 ENCOUNTER — Other Ambulatory Visit (HOSPITAL_COMMUNITY): Payer: Self-pay

## 2021-07-10 ENCOUNTER — Inpatient Hospital Stay: Payer: Medicare Other

## 2021-07-11 ENCOUNTER — Ambulatory Visit (HOSPITAL_COMMUNITY)
Admission: RE | Admit: 2021-07-11 | Discharge: 2021-07-11 | Disposition: A | Discharge status: Home / Self Care | Payer: Medicare Other | Source: Ambulatory Visit | Attending: Hematology | Admitting: Hematology

## 2021-07-11 ENCOUNTER — Other Ambulatory Visit (HOSPITAL_COMMUNITY): Payer: Self-pay

## 2021-07-11 DIAGNOSIS — N281 Cyst of kidney, acquired: Secondary | ICD-10-CM | POA: Diagnosis not present

## 2021-07-11 DIAGNOSIS — C221 Intrahepatic bile duct carcinoma: Secondary | ICD-10-CM | POA: Diagnosis not present

## 2021-07-11 DIAGNOSIS — C240 Malignant neoplasm of extrahepatic bile duct: Secondary | ICD-10-CM | POA: Insufficient documentation

## 2021-07-11 DIAGNOSIS — K314 Gastric diverticulum: Secondary | ICD-10-CM | POA: Diagnosis not present

## 2021-07-11 DIAGNOSIS — C189 Malignant neoplasm of colon, unspecified: Secondary | ICD-10-CM | POA: Diagnosis not present

## 2021-07-11 MED ORDER — GADOBUTROL 1 MMOL/ML IV SOLN
5.0000 mL | Freq: Once | INTRAVENOUS | Status: AC | PRN
  Administered 2021-07-11: 5 mL via INTRAVENOUS

## 2021-07-12 ENCOUNTER — Inpatient Hospital Stay: Payer: Medicare Other | Attending: Hematology | Admitting: Hematology

## 2021-07-12 ENCOUNTER — Inpatient Hospital Stay: Payer: Medicare Other

## 2021-07-12 ENCOUNTER — Encounter: Payer: Self-pay | Admitting: Hematology

## 2021-07-12 ENCOUNTER — Other Ambulatory Visit: Payer: Self-pay

## 2021-07-12 VITALS — BP 150/75 | HR 75 | Temp 98.1°F | Resp 16 | Wt 105.2 lb

## 2021-07-12 DIAGNOSIS — I10 Essential (primary) hypertension: Secondary | ICD-10-CM

## 2021-07-12 DIAGNOSIS — K8031 Calculus of bile duct with cholangitis, unspecified, with obstruction: Secondary | ICD-10-CM | POA: Insufficient documentation

## 2021-07-12 DIAGNOSIS — E538 Deficiency of other specified B group vitamins: Secondary | ICD-10-CM | POA: Diagnosis not present

## 2021-07-12 DIAGNOSIS — C240 Malignant neoplasm of extrahepatic bile duct: Secondary | ICD-10-CM | POA: Diagnosis not present

## 2021-07-12 DIAGNOSIS — C182 Malignant neoplasm of ascending colon: Secondary | ICD-10-CM

## 2021-07-12 DIAGNOSIS — C221 Intrahepatic bile duct carcinoma: Secondary | ICD-10-CM | POA: Diagnosis not present

## 2021-07-12 DIAGNOSIS — Z85038 Personal history of other malignant neoplasm of large intestine: Secondary | ICD-10-CM | POA: Insufficient documentation

## 2021-07-12 DIAGNOSIS — D500 Iron deficiency anemia secondary to blood loss (chronic): Secondary | ICD-10-CM

## 2021-07-12 LAB — CBC WITH DIFFERENTIAL/PLATELET
Abs Immature Granulocytes: 0.02 10*3/uL (ref 0.00–0.07)
Basophils Absolute: 0.1 10*3/uL (ref 0.0–0.1)
Basophils Relative: 1 %
Eosinophils Absolute: 0 10*3/uL (ref 0.0–0.5)
Eosinophils Relative: 1 %
HCT: 47.3 % — ABNORMAL HIGH (ref 36.0–46.0)
Hemoglobin: 15.9 g/dL — ABNORMAL HIGH (ref 12.0–15.0)
Immature Granulocytes: 0 %
Lymphocytes Relative: 11 %
Lymphs Abs: 0.8 10*3/uL (ref 0.7–4.0)
MCH: 29.6 pg (ref 26.0–34.0)
MCHC: 33.6 g/dL (ref 30.0–36.0)
MCV: 88.1 fL (ref 80.0–100.0)
Monocytes Absolute: 0.4 10*3/uL (ref 0.1–1.0)
Monocytes Relative: 6 %
Neutro Abs: 5.8 10*3/uL (ref 1.7–7.7)
Neutrophils Relative %: 81 %
Platelets: 191 10*3/uL (ref 150–400)
RBC: 5.37 MIL/uL — ABNORMAL HIGH (ref 3.87–5.11)
RDW: 13.2 % (ref 11.5–15.5)
WBC: 7.2 10*3/uL (ref 4.0–10.5)
nRBC: 0 % (ref 0.0–0.2)

## 2021-07-12 LAB — IRON AND IRON BINDING CAPACITY (CC-WL,HP ONLY)
Iron: 94 ug/dL (ref 28–170)
Saturation Ratios: 30 % (ref 10.4–31.8)
TIBC: 318 ug/dL (ref 250–450)
UIBC: 224 ug/dL (ref 148–442)

## 2021-07-12 LAB — COMPREHENSIVE METABOLIC PANEL
ALT: 12 U/L (ref 0–44)
AST: 22 U/L (ref 15–41)
Albumin: 4 g/dL (ref 3.5–5.0)
Alkaline Phosphatase: 68 U/L (ref 38–126)
Anion gap: 5 (ref 5–15)
BUN: 17 mg/dL (ref 8–23)
CO2: 29 mmol/L (ref 22–32)
Calcium: 9.8 mg/dL (ref 8.9–10.3)
Chloride: 106 mmol/L (ref 98–111)
Creatinine, Ser: 0.81 mg/dL (ref 0.44–1.00)
GFR, Estimated: >60 mL/min (ref >60)
Glucose, Bld: 105 mg/dL — ABNORMAL HIGH (ref 70–99)
Potassium: 4.5 mmol/L (ref 3.5–5.1)
Sodium: 140 mmol/L (ref 135–145)
Total Bilirubin: 0.9 mg/dL (ref 0.3–1.2)
Total Protein: 7.1 g/dL (ref 6.5–8.1)

## 2021-07-12 LAB — CEA (IN HOUSE-CHCC): CEA (CHCC-In House): 5.38 ng/mL — ABNORMAL HIGH (ref 0.00–5.00)

## 2021-07-12 LAB — FERRITIN: Ferritin: 150 ng/mL (ref 11–307)

## 2021-07-12 NOTE — Progress Notes (Signed)
?[place]   ?Telephone:(336) [phone] [phone]   ?Clinic Follow up Note  ? ?Patient Care Team: ?Dettinger, [name], MD as PCP - General (Family Medicine) ?Rankin, Clent Demark, MD as Consulting Physician (Ophthalmology) ?Donnie Mesa, MD as Consulting Physician (General Surgery) ?Truitt Merle, MD as Consulting Physician (Hematology) ? ?Date of Service:  07/12/2021 ? ?CHIEF COMPLAINT: f/u of cholangiocarcinoma, h/o colon cancer ? ?CURRENT THERAPY:  ?Surveillance ? ?ASSESSMENT & PLAN:  ?Ashley Peters is a 88 y.o. female with  ? ?1. Hilar Cholangiocarcinoma, cTxN1M0 ?-Diagnosed in 02/2021, s/p ERCP and PTC ?-She is not a good candidate for surgical resection due to her advanced age and location of the tumor ?-she received 10 fractions of SBRT under Dr. Lisbeth Renshaw 1/30-2/10/23. ?-post-treatment abdomen MRI yesterday, 07/11/21, showed: no significant change in liver masses; no lymphadenopathy or metastatic disease. I reviewed the images and discussed the results with them today. I discussed that while this did not show any shrinkage, we feel the radiation was helpful as it did not grow either. ?-we discussed additional treatment options today if she has disease progression down the road. I discussed obtaining NGS with [facility] from her biopsy sample. If there is not enough tissue, we could do blood [employer] blood test, to see if she is a candidate for targeted therapy.  I also discussed liver targeted therapy such as embolization as a treatment option.due to her advanced age and asymptomatic now, we will hold on further treatment at this time. If we see further tumor growth or she becomes asymptomatic, I will refer her to IR to see if she is a candidate for liver targeted therapy. ?-labs reviewed, hgb remains slightly elevated at 15.9 today.  CMP unremarkable. ? ?2. Obstructive Jaundice ?-s/p biliary drainage catheter placement on 02/27/21, tube is capped. ?-most recent exchanged on 05/31/21, next  scheduled for 07/26/21 ?-t.bili has returned to normal limits. ?  ?3. Cancer of the right colon, invasive adenocarcinoma, G1, pT4bN0Mx stage IIA, MSI-stable  ?-Diagnosed in 09/2016. Genetic testing was negative. Treated with right hemicolectomy and adjuvant Xeloda. Currently on surveillance. ?-She had a colonoscopy on 12/23/17 with a few polyps removed. Due to her advanced age, no further surveillance colonoscopy was recommended.  ?   ?4. B12 deficiency  ?-She currently receives monthly B12 injections with PCP.  ?  ?  ?Plan  ?-Lab and scan reviewed, stable disease.   ?-Lab and f/u in 6 weeks ?-We will check with pathology to see if her previous cytology sample is sufficient for [facility] ? ? ?No problem-specific Assessment & Plan notes found for this encounter. ? ? ?SUMMARY OF ONCOLOGIC HISTORY: ?Oncology History Overview Note  ? Cancer Staging  ?Cancer of right colon ([place]) ?Staging form: Colon and Rectum, AJCC 8th Edition ?- Pathologic stage from 10/14/2016: Stage IIC (pT4b, pN0, cM0) - Signed by Truitt Merle, MD on 11/07/2016 ?Total positive nodes: 0 ?Tumor deposits (TD): Absent ?Perineural invasion (PNI): Absent ?Microsatellite instability (MSI): Stable ? ?Hilar cholangiocarcinoma ([place]) ?Staging form: Distal Bile Duct, AJCC 8th Edition ?- Clinical stage from 03/08/2021: Stage Unknown (cTX, cN1, cM0) - Signed by Truitt Merle, MD on 03/08/2021 ? ? ?  ?Cancer of right colon ([place])  ?09/19/2016 Imaging  ? CT A/P 09/19/16 ?IMPRESSION: ?Large 9.5 cm intraluminal mass in the cecum and ascending colon, ?consistent with colon carcinoma. Tumor extension into adjacent ?pericolonic fat seen along the lateral wall. ?  ?Mild right pericolonic and mesenteric lymphadenopathy, suspicious ?for metastatic disease. No other sites of metastatic  disease ?identified. ?  ?Colonic diverticulosis, without radiographic evidence of ?diverticulitis. Tiny hiatal hernia, and small epigastric ventral ?hernia containing only fat. ?  ?Aortic  atherosclerosis. ? ?  ?10/14/2016 Initial Diagnosis  ? Cancer of right colon ([place]) ? ?  ?10/14/2016 Surgery  ? OPEN RIGHT HEMICOLECTOMY and REPAIR OF EPIGASTRIC VENTRAL HERNIA by Dr. Georgette Dover and Dr. Dalbert Batman  ? ?  ?10/14/2016 Pathology Results  ? 10/14/16 ?Diagnosis ?Colon, segmental resection for tumor, Right ?ADENOCARCINOMA WITH EXTENSIVE EXTRA CELLULAR MUCIN, GRADE 1, (11.0 CM) ?THE TUMOR INVADES THROUGH THE CECUM WALL INTO ADJACENT TERMINAL ILEUM (PT4B) ?NINETEEN BENIGN LYMPH NODES (0/19) ?SUPPURATIVE INFLAMMATION WITH FIBROSIS AND ADHESION TO ABDOMINAL WALL ?NO ADENOCARCINOMA IDENTIFIED ? ?  ?11/19/2016 Imaging  ? CT Chest WO Contrast 11/19/16 ?IMPRESSION: ?1. No evidence of metastatic disease. ?2. Aortic atherosclerosis (ICD10-170.0). Coronary artery ?calcification. ? ?  ?11/25/2016 - 03/24/2017 Chemotherapy  ? Adjuvant Xeloda two weeks on, one week off starting 11/25/16 for 3 months  ? ?  ?12/21/2016 Genetic Testing  ? Patient had genetic testing due to a personal history of colon cancer and a family history of breast, uterine, and colon cancer. The Common Hereditary Cancers Panel was ordered.  The Hereditary Gene Panel offered by [facility] includes sequencing and/or deletion duplication testing of the following 47 genes: APC, ATM, AXIN2, BARD1, BMPR1A, BRCA1, BRCA2, BRIP1, CDH1, CDKN2A (p14ARF), CDKN2A (p16INK4a), CHEK2, CDK4, CTNNA1, DICER1, EPCAM (Deletion/duplication testing only), GREM1 (promoter region deletion/duplication testing only), KIT, MEN1, MLH1, MSH2, MSH3, MSH6, MUTYH, NBN, NF1, NHTL1, PALB2, PDGFRA, PMS2, POLD1, POLE, PTEN, RAD50, RAD51C, RAD51D, SDHB, SDHC, SDHD, SMAD4, SMARCA4. STK11, TP53, TSC1, TSC2, and VHL.  The following genes were evaluated for sequence changes only: SDHA and HOXB13 c.251G>A variant only. ? ?Results: Negative- no pathogenic variants identified.  The date of this test report is 12/21/2016.  ? ?  ?04/27/2017 Imaging  ? CT AP W Contrast 04/27/17 ?IMPRESSION: ?Status post right  hemicolectomy. No evidence of recurrent or metastatic disease. Additional stable ancillary findings as above. ? ?  ?08/07/2017 Imaging  ? 08/07/2017 DEXA ?ASSESSMENT: ?The BMD measured at Forearm Radius 33% is 0.543 g/cm2 with a T-score of -3.9. This patient is considered osteoporotic according to [facility]) criteria. ? ?  ?11/25/2017 Imaging  ?  ?11/25/2017 CT CAP ?IMPRESSION: ?1. Abnormal appearance of the right lobe of the liver, with ?development of progressive peripheral intrahepatic duct dilatation ?with ill definition of the more central right hepatic duct. This ?appearance is indeterminate. Considerations include an incidental ?primary liver lesion such as cholangiocarcinoma, an otherwise occult ?liver metastasis with secondary biliary duct dilatation, or ?infectious/inflammatory cholangitis. If this 88 year old can undergo ?MRI/MRCP, this should be considered. Multiphase CT (with delayed ?phase imaging to evaluate for possible cholangiocarcinoma) and/or ?focused ultrasound would be alternate imaging strategies. ?2. Otherwise, no evidence of metastatic disease in the chest, abdomen, or pelvis. ?3. Coronary artery atherosclerosis. Aortic Atherosclerosis ?(ICD10-I70.0). ?  ?12/10/2017 Imaging  ? 12/10/2017 MRI Abdomen ?IMPRESSION: ?1. Stricturing of the central bile ducts of segment V RIGHT hepatic ?lobe without clear lesion identified. Differential would include ?benign and malignant stricturing including cholangiocarcinoma. ?Evaluation is hampered by clip artifact from cholecystectomy clips ?and duodenum gas. FDG PET scan may or may not be helpful in identify ?lesion. ERCP would be a second option to evaluate this central ?obstruction. ?2. No enhancing lesion the in the liver parenchyma parenchyma. ?Altered perfusion related to the biliary obstruction. ? ?  ?12/23/2017 Procedure  ? 12/23/2017 Colonoscopy ?Impressions: ?- Decreased sphincter tone found  on digital rectal exam. ?- Two 3 mm polyps  in the rectum and in the descending colon, removed with a cold snare. ?Resected and retrieved. ?- One 8 mm polyp in the rectum, removed with a hot snare. Resected and retrieved. ?- Diverticulosis in the entire examined colon.

## 2021-07-16 ENCOUNTER — Ambulatory Visit (INDEPENDENT_AMBULATORY_CARE_PROVIDER_SITE_OTHER): Payer: Medicare Other | Admitting: *Deleted

## 2021-07-16 DIAGNOSIS — E538 Deficiency of other specified B group vitamins: Secondary | ICD-10-CM | POA: Diagnosis not present

## 2021-07-23 DIAGNOSIS — C240 Malignant neoplasm of extrahepatic bile duct: Secondary | ICD-10-CM | POA: Diagnosis not present

## 2021-07-24 ENCOUNTER — Encounter (HOSPITAL_COMMUNITY): Payer: Self-pay | Admitting: Hematology

## 2021-07-26 ENCOUNTER — Other Ambulatory Visit (HOSPITAL_COMMUNITY): Payer: Self-pay | Admitting: Diagnostic Radiology

## 2021-07-26 ENCOUNTER — Other Ambulatory Visit (HOSPITAL_COMMUNITY): Payer: Self-pay | Admitting: Interventional Radiology

## 2021-07-26 ENCOUNTER — Ambulatory Visit (HOSPITAL_COMMUNITY)
Admission: RE | Admit: 2021-07-26 | Discharge: 2021-07-26 | Disposition: A | Discharge status: Home / Self Care | Payer: Medicare Other | Source: Ambulatory Visit | Attending: Interventional Radiology | Admitting: Interventional Radiology

## 2021-07-26 DIAGNOSIS — C221 Intrahepatic bile duct carcinoma: Secondary | ICD-10-CM

## 2021-07-26 DIAGNOSIS — Z434 Encounter for attention to other artificial openings of digestive tract: Secondary | ICD-10-CM | POA: Diagnosis not present

## 2021-07-26 HISTORY — PX: IR EXCHANGE BILIARY DRAIN: IMG6046

## 2021-07-26 MED ORDER — LIDOCAINE HCL 1 % IJ SOLN
INTRAMUSCULAR | Status: AC
  Filled 2021-07-26: qty 20

## 2021-07-26 NOTE — Procedures (Signed)
Interventional Radiology Procedure:   Indications: Cholangiocarcinoma with biliary drain  Procedure: Drain exchange  Findings: Filling defects in biliary system, may be related to sludge.  New 10 Fr drain placed.  Drain was capped again.  Complications: No immediate complications noted.     EBL: Minimal  Plan: Plan for 8 week exchange.  Patient is tolerating drain capping.  Will discuss with Dr. Burr Medico and Dr. Kathlene Cote about internal stent at next appt.     Syniah Berne R. Anselm Pancoast, MD  Pager: [phone]

## 2021-08-12 ENCOUNTER — Other Ambulatory Visit (HOSPITAL_COMMUNITY): Payer: Self-pay

## 2021-08-16 ENCOUNTER — Ambulatory Visit (INDEPENDENT_AMBULATORY_CARE_PROVIDER_SITE_OTHER): Payer: Medicare Other | Admitting: *Deleted

## 2021-08-16 DIAGNOSIS — E538 Deficiency of other specified B group vitamins: Secondary | ICD-10-CM | POA: Diagnosis not present

## 2021-08-26 ENCOUNTER — Inpatient Hospital Stay: Payer: Medicare Other | Attending: Hematology

## 2021-08-26 ENCOUNTER — Other Ambulatory Visit: Payer: Self-pay

## 2021-08-26 ENCOUNTER — Encounter: Payer: Self-pay | Admitting: Hematology

## 2021-08-26 ENCOUNTER — Inpatient Hospital Stay (HOSPITAL_BASED_OUTPATIENT_CLINIC_OR_DEPARTMENT_OTHER): Payer: Medicare Other | Admitting: Hematology

## 2021-08-26 ENCOUNTER — Other Ambulatory Visit: Payer: Self-pay | Admitting: Lab

## 2021-08-26 VITALS — BP 151/70 | HR 77 | Temp 97.5°F | Resp 16 | Ht 60.0 in | Wt 99.5 lb

## 2021-08-26 DIAGNOSIS — E538 Deficiency of other specified B group vitamins: Secondary | ICD-10-CM | POA: Diagnosis not present

## 2021-08-26 DIAGNOSIS — Z08 Encounter for follow-up examination after completed treatment for malignant neoplasm: Secondary | ICD-10-CM | POA: Insufficient documentation

## 2021-08-26 DIAGNOSIS — I700 Atherosclerosis of aorta: Secondary | ICD-10-CM | POA: Diagnosis not present

## 2021-08-26 DIAGNOSIS — Z85038 Personal history of other malignant neoplasm of large intestine: Secondary | ICD-10-CM | POA: Diagnosis not present

## 2021-08-26 DIAGNOSIS — C240 Malignant neoplasm of extrahepatic bile duct: Secondary | ICD-10-CM | POA: Diagnosis not present

## 2021-08-26 DIAGNOSIS — I2510 Atherosclerotic heart disease of native coronary artery without angina pectoris: Secondary | ICD-10-CM | POA: Insufficient documentation

## 2021-08-26 DIAGNOSIS — R970 Elevated carcinoembryonic antigen [CEA]: Secondary | ICD-10-CM | POA: Insufficient documentation

## 2021-08-26 DIAGNOSIS — D649 Anemia, unspecified: Secondary | ICD-10-CM | POA: Insufficient documentation

## 2021-08-26 DIAGNOSIS — C182 Malignant neoplasm of ascending colon: Secondary | ICD-10-CM | POA: Diagnosis not present

## 2021-08-26 DIAGNOSIS — Z8505 Personal history of malignant neoplasm of liver: Secondary | ICD-10-CM | POA: Insufficient documentation

## 2021-08-26 DIAGNOSIS — N281 Cyst of kidney, acquired: Secondary | ICD-10-CM | POA: Diagnosis not present

## 2021-08-26 DIAGNOSIS — D500 Iron deficiency anemia secondary to blood loss (chronic): Secondary | ICD-10-CM

## 2021-08-26 LAB — CBC WITH DIFFERENTIAL/PLATELET
Abs Immature Granulocytes: 0.02 10*3/uL (ref 0.00–0.07)
Basophils Absolute: 0.1 10*3/uL (ref 0.0–0.1)
Basophils Relative: 1 %
Eosinophils Absolute: 0 10*3/uL (ref 0.0–0.5)
Eosinophils Relative: 1 %
HCT: 45.2 % (ref 36.0–46.0)
Hemoglobin: 15 g/dL (ref 12.0–15.0)
Immature Granulocytes: 0 %
Lymphocytes Relative: 9 %
Lymphs Abs: 0.6 10*3/uL — ABNORMAL LOW (ref 0.7–4.0)
MCH: 29.1 pg (ref 26.0–34.0)
MCHC: 33.2 g/dL (ref 30.0–36.0)
MCV: 87.8 fL (ref 80.0–100.0)
Monocytes Absolute: 0.4 10*3/uL (ref 0.1–1.0)
Monocytes Relative: 7 %
Neutro Abs: 5 10*3/uL (ref 1.7–7.7)
Neutrophils Relative %: 82 %
Platelets: 193 10*3/uL (ref 150–400)
RBC: 5.15 MIL/uL — ABNORMAL HIGH (ref 3.87–5.11)
RDW: 13.5 % (ref 11.5–15.5)
WBC: 6.1 10*3/uL (ref 4.0–10.5)
nRBC: 0 % (ref 0.0–0.2)

## 2021-08-26 LAB — COMPREHENSIVE METABOLIC PANEL
ALT: 10 U/L (ref 0–44)
AST: 20 U/L (ref 15–41)
Albumin: 3.8 g/dL (ref 3.5–5.0)
Alkaline Phosphatase: 91 U/L (ref 38–126)
Anion gap: 7 (ref 5–15)
BUN: 22 mg/dL (ref 8–23)
CO2: 28 mmol/L (ref 22–32)
Calcium: 10 mg/dL (ref 8.9–10.3)
Chloride: 106 mmol/L (ref 98–111)
Creatinine, Ser: 0.86 mg/dL (ref 0.44–1.00)
GFR, Estimated: >60 mL/min (ref >60)
Glucose, Bld: 87 mg/dL (ref 70–99)
Potassium: 3.6 mmol/L (ref 3.5–5.1)
Sodium: 141 mmol/L (ref 135–145)
Total Bilirubin: 0.6 mg/dL (ref 0.3–1.2)
Total Protein: 6.9 g/dL (ref 6.5–8.1)

## 2021-08-26 LAB — IRON AND IRON BINDING CAPACITY (CC-WL,HP ONLY)
Iron: 50 ug/dL (ref 28–170)
Saturation Ratios: 19 % (ref 10.4–31.8)
TIBC: 266 ug/dL (ref 250–450)
UIBC: 216 ug/dL (ref 148–442)

## 2021-08-26 LAB — FERRITIN: Ferritin: 140 ng/mL (ref 11–307)

## 2021-08-26 LAB — CEA (IN HOUSE-CHCC): CEA (CHCC-In House): 4.86 ng/mL (ref 0.00–5.00)

## 2021-08-26 NOTE — Progress Notes (Signed)
[place]   [phone] [phone]   Clinic Follow up Note   Patient Care Team: Dettinger, [name], MD as PCP - General (Family Medicine) Zadie Rhine Clent Demark, MD as Consulting Physician (Ophthalmology) Donnie Mesa, MD as Consulting Physician (General Surgery) Truitt Merle, MD as Consulting Physician (Hematology)  Date of Service:  08/26/2021  CHIEF COMPLAINT: f/u of cholangiocarcinoma, h/o colon cancer  CURRENT THERAPY:  Surveillance  ASSESSMENT & PLAN:  HARLAN VINAL is a 88 y.o. female with   1. Hilar Cholangiocarcinoma, cTxN1M0 -Diagnosed in 02/2021, s/p ERCP and PTC -She is not a good candidate for surgical resection due to her advanced age and location of the tumor -she received 10 fractions of SBRT under Dr. Lisbeth Renshaw 1/30-2/10/23. -post-treatment abdomen MRI yesterday, 07/11/21, showed: no significant change in liver masses; no lymphadenopathy or metastatic disease.  -FO was negative for any mutations. -most recent CEA from 07/12/21 was up to 5.38.  -I reviewed the FO one results with them today and discussed that she is not eligible for any targeted therapy options. I again recommended holding further treatment given her advanced age and being asymptomatic. After discussion, we agreed to continue to monitor her for now. We discussed the role of scan to monitor her disease, she opted to forgo scan for now  -labs reviewed, CBC is WNL. Other lab results are pending, including CEA; will call with results.   2. Obstructive Jaundice -s/p biliary drainage catheter placement on 02/27/21, tube is capped. -most recent exchanged on 05/31/21, next scheduled for 07/26/21 -t.bili has returned to normal limits.   3. Cancer of the right colon, invasive adenocarcinoma, G1, pT4bN0Mx stage IIA, MSI-stable  -Diagnosed in 09/2016. Genetic testing was negative. Treated with right hemicolectomy and adjuvant Xeloda. Currently on surveillance. -She had a colonoscopy on  12/23/17 with a few polyps removed. Due to her advanced age, no further surveillance colonoscopy was recommended.     4. B12 deficiency  -She currently receives monthly B12 injections with PCP.      Plan  -Lab and f/u in 2 months   No problem-specific Assessment & Plan notes found for this encounter.   SUMMARY OF ONCOLOGIC HISTORY: Oncology History Overview Note   Cancer Staging  Cancer of right colon [facility]) Staging form: Colon and Rectum, AJCC 8th Edition - Pathologic stage from 10/14/2016: Stage IIC (pT4b, pN0, cM0) - Signed by Truitt Merle, MD on 11/07/2016 Total positive nodes: 0 Tumor deposits (TD): Absent Perineural invasion (PNI): Absent Microsatellite instability (MSI): Stable  Hilar cholangiocarcinoma ([place]) Staging form: Distal Bile Duct, AJCC 8th Edition - Clinical stage from 03/08/2021: Stage Unknown (cTX, cN1, cM0) - Signed by Truitt Merle, MD on 03/08/2021     Cancer of right colon ([place])  09/19/2016 Imaging   CT A/P 09/19/16 IMPRESSION: Large 9.5 cm intraluminal mass in the cecum and ascending colon, consistent with colon carcinoma. Tumor extension into adjacent pericolonic fat seen along the lateral wall.   Mild right pericolonic and mesenteric lymphadenopathy, suspicious for metastatic disease. No other sites of metastatic disease identified.   Colonic diverticulosis, without radiographic evidence of diverticulitis. Tiny hiatal hernia, and small epigastric ventral hernia containing only fat.   Aortic atherosclerosis.   10/14/2016 Initial Diagnosis   Cancer of right colon ([place])   10/14/2016 Surgery   OPEN RIGHT HEMICOLECTOMY and REPAIR OF EPIGASTRIC VENTRAL HERNIA by Dr. Georgette Dover and Dr. Dalbert Batman    10/14/2016 Pathology Results   10/14/16 Diagnosis Colon, segmental resection for tumor, Right ADENOCARCINOMA WITH  EXTENSIVE EXTRA CELLULAR MUCIN, GRADE 1, (11.0 CM) THE TUMOR INVADES THROUGH THE CECUM WALL INTO ADJACENT TERMINAL ILEUM (PT4B) NINETEEN BENIGN LYMPH NODES  (0/19) SUPPURATIVE INFLAMMATION WITH FIBROSIS AND ADHESION TO ABDOMINAL WALL NO ADENOCARCINOMA IDENTIFIED   11/19/2016 Imaging   CT Chest WO Contrast 11/19/16 IMPRESSION: 1. No evidence of metastatic disease. 2. Aortic atherosclerosis (ICD10-170.0). Coronary artery calcification.   11/25/2016 - 03/24/2017 Chemotherapy   Adjuvant Xeloda two weeks on, one week off starting 11/25/16 for 3 months     12/21/2016 Genetic Testing   Patient had genetic testing due to a personal history of colon cancer and a family history of breast, uterine, and colon cancer. The Common Hereditary Cancers Panel was ordered.  The Hereditary Gene Panel offered by [facility] includes sequencing and/or deletion duplication testing of the following 47 genes: APC, ATM, AXIN2, BARD1, BMPR1A, BRCA1, BRCA2, BRIP1, CDH1, CDKN2A (p14ARF), CDKN2A (p16INK4a), CHEK2, CDK4, CTNNA1, DICER1, EPCAM (Deletion/duplication testing only), GREM1 (promoter region deletion/duplication testing only), KIT, MEN1, MLH1, MSH2, MSH3, MSH6, MUTYH, NBN, NF1, NHTL1, PALB2, PDGFRA, PMS2, POLD1, POLE, PTEN, RAD50, RAD51C, RAD51D, SDHB, SDHC, SDHD, SMAD4, SMARCA4. STK11, TP53, TSC1, TSC2, and VHL.  The following genes were evaluated for sequence changes only: SDHA and HOXB13 c.251G>A variant only.  Results: Negative- no pathogenic variants identified.  The date of this test report is 12/21/2016.    04/27/2017 Imaging   CT AP W Contrast 04/27/17 IMPRESSION: Status post right hemicolectomy. No evidence of recurrent or metastatic disease. Additional stable ancillary findings as above.   08/07/2017 Imaging   08/07/2017 DEXA ASSESSMENT: The BMD measured at Forearm Radius 33% is 0.543 g/cm2 with a T-score of -3.9. This patient is considered osteoporotic according to [facility]) criteria.   11/25/2017 Imaging    11/25/2017 CT CAP IMPRESSION: 1. Abnormal appearance of the right lobe of the liver, with development of progressive peripheral  intrahepatic duct dilatation with ill definition of the more central right hepatic duct. This appearance is indeterminate. Considerations include an incidental primary liver lesion such as cholangiocarcinoma, an otherwise occult liver metastasis with secondary biliary duct dilatation, or infectious/inflammatory cholangitis. If this 88 year old can undergo MRI/MRCP, this should be considered. Multiphase CT (with delayed phase imaging to evaluate for possible cholangiocarcinoma) and/or focused ultrasound would be alternate imaging strategies. 2. Otherwise, no evidence of metastatic disease in the chest, abdomen, or pelvis. 3. Coronary artery atherosclerosis. Aortic Atherosclerosis (ICD10-I70.0).   12/10/2017 Imaging   12/10/2017 MRI Abdomen IMPRESSION: 1. Stricturing of the central bile ducts of segment V RIGHT hepatic lobe without clear lesion identified. Differential would include benign and malignant stricturing including cholangiocarcinoma. Evaluation is hampered by clip artifact from cholecystectomy clips and duodenum gas. FDG PET scan may or may not be helpful in identify lesion. ERCP would be a second option to evaluate this central obstruction. 2. No enhancing lesion the in the liver parenchyma parenchyma. Altered perfusion related to the biliary obstruction.   12/23/2017 Procedure   12/23/2017 Colonoscopy Impressions: - Decreased sphincter tone found on digital rectal exam. - Two 3 mm polyps in the rectum and in the descending colon, removed with a cold snare. Resected and retrieved. - One 8 mm polyp in the rectum, removed with a hot snare. Resected and retrieved. - Diverticulosis in the entire examined colon. - The examination was otherwise normal on direct and retroflexion views.   08/24/2018 Imaging   CT AP  IMPRESSION: 1. Over the last 9 months there has been a similar not significantly progressive appearance of biliary  dilatation and complexity in segment 5 of the  liver along with mild intrahepatic biliary dilatation elsewhere, and moderate extrahepatic biliary dilatation. Some of this is likely postinflammatory; the most complex and dilated bile duct did not have definite enhancement on the recent MRI to definitively indicate cholangiocarcinoma or metastatic lesion but the appearance of the right hepatic lobe is progressive from 09/19/2016 and from 04/27/2017. In the context of the patient's colon cancer, nuclear medicine PET-CT may provide some helpful adjunct information given this unusual appearance. 2. Other imaging findings of potential clinical significance: Mitral calcification. Aortoiliac atherosclerotic vascular disease. Renal cysts. Trace amount of gas in the urinary bladder, query recent catheterization. Descending and sigmoid colon diverticulosis. Two ventral hernias are enlarged compared to the prior exam but lax with wide ostium. No overt pathologic adenopathy currently.   08/24/2019 Imaging   CT CAP w contrast  IMPRESSION: 1. Right hemicolectomy. No evidence of recurrent or metastatic disease. 2. Intrahepatic/extrahepatic biliary ductal dilatation, unchanged from 11/25/2017. 3. Multiple midline supraumbilical ventral hernias contain unobstructed bowel or fat. 4. Aortic atherosclerosis (ICD10-I70.0). Coronary artery calcification.   09/20/2020 Imaging   MRI Abdomen  IMPRESSION: 1. Irregular peripheral enhancement and progressive enhancement of the lesion along the hepatic margin suspicious for peribiliary spread of colonic neoplasm (metastatic disease from colon primary) given findings on prior studies versus is cholangiocarcinoma. 2. Central biliary cast and stones filling the dilated common bile duct with diffuse biliary duct dilation. No visible enhancement to suggest tumor in these areas though assessment is limited due to clip artifact. Tumor extending into the RIGHT hepatic duct is difficult to exclude given the presence  of clip artifact but on image 26 of series 33 there is suggestion of enhancing material within the lumen. 3. Slight increase in size of celiac lymph nodes dating back to 2021, suspicious in light of other findings. No retroperitoneal adenopathy.   12/04/2020 Imaging   MRI Abdomen  IMPRESSION: 1. Interval extraction of multiple common bile duct calculi with persistent intra and extrahepatic biliary ductal dilatation, similar in caliber to prior examination.   2. A large calculus remains in the intrahepatic common bile duct measuring at least 2.6 cm.   3. No significant change in heterogeneously enhancing, somewhat ill-defined lesions of the anterior right lobe of the liver, hepatic segment V, or within the central liver. As on prior examination, these remain consistent with either metastases from patient's known colon malignancy or alternately cholangiocarcinoma.   Hilar cholangiocarcinoma ([place])  12/22/2020 PET scan   IMPRESSION: 1. 9 mm ground-glass nodule in the posterior right lower lobe is hypermetabolic. This nodule is new since 09/19/2020 while potentially infectious/inflammatory, primary bronchogenic neoplasm or metastatic disease could have this appearance. Close follow-up recommended. 2. 2 areas of low level hypermetabolism in the liver without obvious underlying correlate on CT imaging. Metastatic disease remains a concern. 3.  Aortic Atherosclerois (ICD10-170.0)   02/26/2021 Pathology Results   FINAL MICROSCOPIC DIAGNOSIS:  A. COMMON BILE DUCT, BIOPSY:  -  Poorly differentiated carcinoma    A. COMMON BILE DUCT, BRUSHING:  FINAL MICROSCOPIC DIAGNOSIS:  - Malignant cells consistent with adenocarcinoma    02/26/2021 Procedure   ERCP, Dr. Rush Landmark  Impression: - The major papilla was located entirely within a diverticulum. The major papilla appeared edematous and had a prior biliary sphincterotomy. - Difficult cannulation as wire had preferential placement into the  cystic stump but eventually wire was able to go into the biliary tree partially. Could not attain deep selective cannulation of proximal biliary  tree. - A biliary tract obstruction was found in the upper third of the main duct as a result of mass-like area on fluoroscopy. - A biliary sphincterotomy extension was performed and this allowed for angled wire placement. - The biliary tree was swept and sludge was found. - Cells for cytology obtained in the middle third of the main bile duct with 2 brushes. - Spyglass cholangioscopy performed. 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 This could not be traversed. This was sampled via Spybite. - Unable to access proximal biliary tree, so could not place any stents unfortunately.   03/08/2021 Initial Diagnosis   Hilar cholangiocarcinoma ([place])   03/08/2021 Cancer Staging   Staging form: Distal Bile Duct, AJCC 8th Edition - Clinical stage from 03/08/2021: Stage Unknown (cTX, cN1, cM0) - Signed by Truitt Merle, MD on 03/08/2021   04/01/2021 Imaging   EXAM: CT CHEST WITH CONTRAST  IMPRESSION: 1. Ill-defined nodules in the right lung which could be of infectious or inflammatory etiology, although underlying neoplasm is not entirely excluded. The largest of these is a sub solid 9 x 8 mm nodule in the right middle lobe. Non-contrast chest CT at 3-6 months is recommended. If nodules persist, subsequent management will be based upon the most suspicious nodule(s). This recommendation follows the consensus statement: Guidelines for Management of Incidental Pulmonary Nodules Detected on CT Images: From the Fleischner Society 2017; Radiology 2017; 284:228-243. 2. Infiltrative intrahepatic neoplasm with invasion into the common bile duct again noted, presumably reflective of cholangiocarcinoma. 3. Aortic atherosclerosis, in addition to left anterior descending coronary artery disease.       INTERVAL HISTORY:  Ashley Peters is here for a follow up of cholangiocarcinoma. She was last seen by me on 07/12/21. She presents to the clinic accompanied by her daughter. She reports some days are good, and some day are not. She reports continued nausea, which causes difficulty eating.   All other systems were reviewed with the patient and are negative.  MEDICAL HISTORY:  Past Medical History:  Diagnosis Date   Anemia    Blood transfusion without reported diagnosis    Cancer ([place])    Colon cancer ([place]) dx'd 10/2016   Cystoid macular edema of left eye 07/05/2019   Diverticulosis    Family history of breast cancer    Family history of colon cancer    Family history of uterine cancer    GERD (gastroesophageal reflux disease)    History of kidney stones    Hyperlipidemia    Hypertension    Macular degeneration    Osteopenia     SURGICAL HISTORY: Past Surgical History:  Procedure Laterality Date   ABDOMINAL HYSTERECTOMY     ablation tr Great saphenous vein  10/17/2009   BILIARY BRUSHING  02/26/2021   Procedure: BILIARY BRUSHING;  Surgeon: Irving Copas., MD;  Location: [facility];  Service: Gastroenterology;;   BIOPSY  02/26/2021   Procedure: BIOPSY;  Surgeon: Irving Copas., MD;  Location: [facility];  Service: Gastroenterology;;   BREAST EXCISIONAL BIOPSY Bilateral 1971   brest biopsy  [place] N/A 10/14/2016   Procedure: REPAIR OF EPIGASTRIC VENTRAL HERNIA;  Surgeon: Donnie Mesa, MD;  Location: [facility];  Service: General;  Laterality: N/A;   ERCP N/A 09/21/2020   Procedure: ENDOSCOPIC RETROGRADE CHOLANGIOPANCREATOGRAPHY (ERCP);  Surgeon: Gatha Mayer, MD;  Location: [facility];  Service: Endoscopy;  Laterality: N/A;  ERCP N/A 02/26/2021   Procedure: ENDOSCOPIC RETROGRADE CHOLANGIOPANCREATOGRAPHY (ERCP);  Surgeon: Irving Copas., MD;  Location: [facility];  Service: Gastroenterology;   Laterality: N/A;   IR EXCHANGE BILIARY DRAIN  05/31/2021   IR EXCHANGE BILIARY DRAIN  07/26/2021   IR PERCUTANEOUS TRANSHEPATIC CHOLANGIOGRAM  02/27/2021   lt. distal ureteral stone extraction  09/14/1991   PARTIAL COLECTOMY Right 10/14/2016   Procedure: OPEN RIGHT HEMICOLECTOMY;  Surgeon: Donnie Mesa, MD;  Location: [facility];  Service: General;  Laterality: Right;   REMOVAL OF STONES  09/21/2020   Procedure: REMOVAL OF STONES;  Surgeon: Gatha Mayer, MD;  Location: [facility];  Service: Endoscopy;;   REMOVAL OF STONES  02/26/2021   Procedure: REMOVAL OF SLUDGE;  Surgeon: Irving Copas., MD;  Location: [facility];  Service: Gastroenterology;;   [name] Mayans  09/21/2020   Procedure: [name] Mayans;  Surgeon: Gatha Mayer, MD;  Location: [facility];  Service: Endoscopy;;   SPHINCTEROTOMY  02/26/2021   Procedure: [name] Mayans;  Surgeon: Mansouraty, Telford Nab., MD;  Location: [facility];  Service: Gastroenterology;;   [facility] CHOLANGIOSCOPY N/A 02/26/2021   Procedure: [id number] CHOLANGIOSCOPY;  Surgeon: Irving Copas., MD;  Location: [facility];  Service: Gastroenterology;  Laterality: N/A;   STONE EXTRACTION WITH BASKET  09/21/2020   Procedure: STONE EXTRACTION WITH BASKET;  Surgeon: Gatha Mayer, MD;  Location: [facility];  Service: Endoscopy;;    I have reviewed the social history and family history with the patient and they are unchanged from previous note.  ALLERGIES:  is allergic to benicar [olmesartan medoxomil] and amoxicillin.  MEDICATIONS:  Current Outpatient Medications  Medication Sig Dispense Refill   acetaminophen (TYLENOL) 500 MG tablet Take 2 tablets (1,000 mg total) by mouth every 6 (six) hours as needed for moderate pain. (Patient taking differently: Take 500-1,000 mg by mouth every 6 (six) hours as needed for moderate pain.) 30 tablet 0   alendronate (FOSAMAX) 70 MG tablet Take with a full glass of water on an empty stomach. 12 tablet 3    Cyanocobalamin 1000 MCG/ML KIT Inject 1,000 mcg as directed every 30 (thirty) days.     Multiple Vitamins-Minerals (PRESERVISION AREDS 2 PO) Take 1 capsule by mouth 2 (two) times daily.     ondansetron (ZOFRAN) 8 MG tablet Take 1 tablet (8 mg total) by mouth every 8 (eight) hours as needed for nausea or vomiting. 60 tablet 3   prochlorperazine (COMPAZINE) 5 MG tablet Take 1-2 tablets (5-10 mg total) by mouth every 6 (six) hours as needed for nausea or vomiting. 30 tablet 0   Sodium Chloride Flush (NORMAL SALINE FLUSH) 0.9 % SOLN Flush drain once a daily with 5 MLs. Then discard the remainder 300 mL 3   Current Facility-Administered Medications  Medication Dose Route Frequency Provider Last Rate Last Admin   cyanocobalamin ((VITAMIN B-12)) injection 1,000 mcg  1,000 mcg Intramuscular Q30 days Dettinger, [name], MD   1,000 mcg at 08/16/21 0817    PHYSICAL EXAMINATION: ECOG PERFORMANCE STATUS: 1 - Symptomatic but completely ambulatory  Vitals:   08/26/21 0807  BP: (!) 151/70  Pulse: 77  Resp: 16  Temp: (!) 97.5 F (36.4 C)  SpO2: 100%   Wt Readings from Last 3 Encounters:  08/26/21 99 lb 8 oz (45.1 kg)  07/12/21 105 lb 3 oz (47.7 kg)  05/31/21 108 lb 9.6 oz (49.3 kg)     GENERAL:alert, no distress and comfortable SKIN: skin color, texture, turgor are normal, no rashes or significant lesions EYES:  normal, Conjunctiva are pink and non-injected, sclera clear LUNGS: clear to auscultation and percussion with normal breathing effort HEART: regular rate & rhythm and no murmurs and no lower extremity edema ABDOMEN:abdomen soft, non-tender and normal bowel sounds Musculoskeletal:no cyanosis of digits and no clubbing  NEURO: alert & oriented x 3 with fluent speech, no focal motor/sensory deficits  LABORATORY DATA:  I have reviewed the data as listed    Latest Ref Rng & Units 08/26/2021    7:43 AM 07/12/2021    8:08 AM 05/31/2021   11:33 AM  CBC  WBC 4.0 - 10.5 K/uL 6.1  7.2  6.4    Hemoglobin 12.0 - 15.0 g/dL 15.0  15.9  15.5   Hematocrit 36.0 - 46.0 % 45.2  47.3  45.5   Platelets 150 - 400 K/uL 193  191  200         Latest Ref Rng & Units 08/26/2021    7:43 AM 07/12/2021    8:08 AM 05/31/2021   11:33 AM  CMP  Glucose 70 - 99 mg/dL 87  105  91   BUN 8 - 23 mg/dL _0 Creatinine 0.44 - 1.00 mg/dL 0.86  0.81  0.96   Sodium 135 - 145 mmol/L 141  140  138   Potassium 3.5 - 5.1 mmol/L 3.6  4.5  4.6   Chloride 98 - 111 mmol/L 106  106  103   CO2 22 - 32 mmol/L _1 Calcium 8.9 - 10.3 mg/dL 10.0  9.8  10.1   Total Protein 6.5 - 8.1 g/dL 6.9  7.1  6.7   Total Bilirubin 0.3 - 1.2 mg/dL 0.6  0.9  0.9   Alkaline Phos 38 - 126 U/L 91  68  71   AST 15 - 41 U/L _2 ALT 0 - 44 U/L _3 RADIOGRAPHIC STUDIES: I have personally reviewed the radiological images as listed and agreed with the findings in the report. No results found.    No orders of the defined types were placed in this encounter.  All questions were answered. The patient knows to call the clinic with any problems, questions or concerns. No barriers to learning was detected. The total time spent in the appointment was 30 minutes.     Truitt Merle, MD 08/26/2021   I, Wilburn Mylar, am acting as scribe for Truitt Merle, MD.   I have reviewed the above documentation for accuracy and completeness, and I agree with the above.

## 2021-08-27 LAB — CANCER ANTIGEN 19-9: CA 19-9: 28 U/mL (ref 0–35)

## 2021-09-11 ENCOUNTER — Other Ambulatory Visit (HOSPITAL_COMMUNITY): Payer: Self-pay

## 2021-09-14 ENCOUNTER — Other Ambulatory Visit (HOSPITAL_COMMUNITY): Payer: Self-pay

## 2021-09-16 ENCOUNTER — Ambulatory Visit (INDEPENDENT_AMBULATORY_CARE_PROVIDER_SITE_OTHER): Payer: Medicare Other

## 2021-09-16 DIAGNOSIS — E538 Deficiency of other specified B group vitamins: Secondary | ICD-10-CM | POA: Diagnosis not present

## 2021-09-16 NOTE — Progress Notes (Signed)
Cyanocobalamin injection given to left deltoid.  Patient tolerated well. 

## 2021-09-20 ENCOUNTER — Ambulatory Visit (HOSPITAL_COMMUNITY)
Admission: RE | Admit: 2021-09-20 | Discharge: 2021-09-20 | Disposition: A | Discharge status: Home / Self Care | Payer: Medicare Other | Source: Ambulatory Visit | Attending: Diagnostic Radiology | Admitting: Diagnostic Radiology

## 2021-09-20 ENCOUNTER — Other Ambulatory Visit (HOSPITAL_COMMUNITY): Payer: Self-pay | Admitting: Interventional Radiology

## 2021-09-20 DIAGNOSIS — C221 Intrahepatic bile duct carcinoma: Secondary | ICD-10-CM

## 2021-09-20 DIAGNOSIS — Z434 Encounter for attention to other artificial openings of digestive tract: Secondary | ICD-10-CM | POA: Insufficient documentation

## 2021-09-20 HISTORY — PX: IR CATHETER TUBE CHANGE: IMG717

## 2021-09-20 HISTORY — PX: IR EXCHANGE BILIARY DRAIN: IMG6046

## 2021-09-20 MED ORDER — LIDOCAINE HCL 1 % IJ SOLN
INTRAMUSCULAR | Status: AC
  Filled 2021-09-20: qty 20

## 2021-09-20 MED ORDER — IOHEXOL 300 MG/ML  SOLN
50.0000 mL | Freq: Once | INTRAMUSCULAR | Status: AC | PRN
  Administered 2021-09-20: 10 mL

## 2021-09-23 ENCOUNTER — Encounter (HOSPITAL_COMMUNITY): Payer: Self-pay | Admitting: Radiology

## 2021-09-23 ENCOUNTER — Other Ambulatory Visit (HOSPITAL_COMMUNITY): Payer: Self-pay | Admitting: Diagnostic Radiology

## 2021-09-23 DIAGNOSIS — C221 Intrahepatic bile duct carcinoma: Secondary | ICD-10-CM

## 2021-10-03 ENCOUNTER — Ambulatory Visit: Payer: Medicare Other | Admitting: Hematology

## 2021-10-03 ENCOUNTER — Other Ambulatory Visit: Payer: Medicare Other

## 2021-10-13 ENCOUNTER — Other Ambulatory Visit (HOSPITAL_COMMUNITY): Payer: Self-pay

## 2021-10-14 ENCOUNTER — Other Ambulatory Visit (HOSPITAL_COMMUNITY): Payer: Self-pay

## 2021-10-15 ENCOUNTER — Other Ambulatory Visit (HOSPITAL_COMMUNITY): Payer: Self-pay

## 2021-10-15 MED ORDER — SODIUM CHLORIDE 0.9% FLUSH
INTRAVENOUS | 8 refills | Status: AC
  Filled 2021-10-15: qty 300, 30d supply, fill #0
  Filled 2021-11-17: qty 300, 30d supply, fill #1

## 2021-10-16 ENCOUNTER — Other Ambulatory Visit (HOSPITAL_COMMUNITY): Payer: Self-pay

## 2021-10-17 ENCOUNTER — Ambulatory Visit (INDEPENDENT_AMBULATORY_CARE_PROVIDER_SITE_OTHER): Payer: Medicare Other

## 2021-10-17 ENCOUNTER — Ambulatory Visit: Payer: Medicare Other

## 2021-10-17 DIAGNOSIS — E538 Deficiency of other specified B group vitamins: Secondary | ICD-10-CM

## 2021-10-17 NOTE — Progress Notes (Signed)
Cyanocobalamin injection given to right deltoid.  Patient tolerated well. 

## 2021-10-25 ENCOUNTER — Other Ambulatory Visit: Payer: Self-pay

## 2021-10-25 ENCOUNTER — Encounter: Payer: Self-pay | Admitting: Hematology

## 2021-10-25 ENCOUNTER — Inpatient Hospital Stay (HOSPITAL_BASED_OUTPATIENT_CLINIC_OR_DEPARTMENT_OTHER): Payer: Medicare Other | Admitting: Hematology

## 2021-10-25 ENCOUNTER — Inpatient Hospital Stay: Payer: Medicare Other | Attending: Hematology

## 2021-10-25 VITALS — BP 123/80 | HR 92 | Temp 97.6°F | Resp 18 | Wt 89.0 lb

## 2021-10-25 DIAGNOSIS — C182 Malignant neoplasm of ascending colon: Secondary | ICD-10-CM

## 2021-10-25 DIAGNOSIS — Z85038 Personal history of other malignant neoplasm of large intestine: Secondary | ICD-10-CM | POA: Diagnosis not present

## 2021-10-25 DIAGNOSIS — R634 Abnormal weight loss: Secondary | ICD-10-CM | POA: Diagnosis not present

## 2021-10-25 DIAGNOSIS — M549 Dorsalgia, unspecified: Secondary | ICD-10-CM | POA: Diagnosis not present

## 2021-10-25 DIAGNOSIS — C221 Intrahepatic bile duct carcinoma: Secondary | ICD-10-CM | POA: Insufficient documentation

## 2021-10-25 DIAGNOSIS — Z08 Encounter for follow-up examination after completed treatment for malignant neoplasm: Secondary | ICD-10-CM | POA: Insufficient documentation

## 2021-10-25 DIAGNOSIS — D500 Iron deficiency anemia secondary to blood loss (chronic): Secondary | ICD-10-CM

## 2021-10-25 DIAGNOSIS — E538 Deficiency of other specified B group vitamins: Secondary | ICD-10-CM | POA: Diagnosis not present

## 2021-10-25 DIAGNOSIS — C240 Malignant neoplasm of extrahepatic bile duct: Secondary | ICD-10-CM | POA: Diagnosis not present

## 2021-10-25 LAB — IRON AND IRON BINDING CAPACITY (CC-WL,HP ONLY)
Iron: 35 ug/dL (ref 28–170)
Saturation Ratios: 17 % (ref 10.4–31.8)
TIBC: 210 ug/dL — ABNORMAL LOW (ref 250–450)
UIBC: 175 ug/dL (ref 148–442)

## 2021-10-25 LAB — CMP (CANCER CENTER ONLY)
ALT: 33 U/L (ref 0–44)
AST: 41 U/L (ref 15–41)
Albumin: 3.6 g/dL (ref 3.5–5.0)
Alkaline Phosphatase: 262 U/L — ABNORMAL HIGH (ref 38–126)
Anion gap: 3 — ABNORMAL LOW (ref 5–15)
BUN: 25 mg/dL — ABNORMAL HIGH (ref 8–23)
CO2: 29 mmol/L (ref 22–32)
Calcium: 9.9 mg/dL (ref 8.9–10.3)
Chloride: 104 mmol/L (ref 98–111)
Creatinine: 0.83 mg/dL (ref 0.44–1.00)
GFR, Estimated: >60 mL/min (ref >60)
Glucose, Bld: 128 mg/dL — ABNORMAL HIGH (ref 70–99)
Potassium: 5.3 mmol/L — ABNORMAL HIGH (ref 3.5–5.1)
Sodium: 136 mmol/L (ref 135–145)
Total Bilirubin: 1.1 mg/dL (ref 0.3–1.2)
Total Protein: 6.5 g/dL (ref 6.5–8.1)

## 2021-10-25 LAB — CBC WITH DIFFERENTIAL/PLATELET
Abs Immature Granulocytes: 0.05 10*3/uL (ref 0.00–0.07)
Basophils Absolute: 0 10*3/uL (ref 0.0–0.1)
Basophils Relative: 1 %
Eosinophils Absolute: 0 10*3/uL (ref 0.0–0.5)
Eosinophils Relative: 1 %
HCT: 43.8 % (ref 36.0–46.0)
Hemoglobin: 14.8 g/dL (ref 12.0–15.0)
Immature Granulocytes: 1 %
Lymphocytes Relative: 4 %
Lymphs Abs: 0.4 10*3/uL — ABNORMAL LOW (ref 0.7–4.0)
MCH: 29.1 pg (ref 26.0–34.0)
MCHC: 33.8 g/dL (ref 30.0–36.0)
MCV: 86.1 fL (ref 80.0–100.0)
Monocytes Absolute: 0.4 10*3/uL (ref 0.1–1.0)
Monocytes Relative: 5 %
Neutro Abs: 7 10*3/uL (ref 1.7–7.7)
Neutrophils Relative %: 88 %
Platelets: 178 10*3/uL (ref 150–400)
RBC: 5.09 MIL/uL (ref 3.87–5.11)
RDW: 14.3 % (ref 11.5–15.5)
WBC: 7.9 10*3/uL (ref 4.0–10.5)
nRBC: 0 % (ref 0.0–0.2)

## 2021-10-25 LAB — CEA (IN HOUSE-CHCC)
CEA (CHCC-In House): 3.92 ng/mL (ref 0.00–5.00)
CEA (CHCC-In House): 4.03 ng/mL (ref 0.00–5.00)

## 2021-10-25 LAB — FERRITIN: Ferritin: 334 ng/mL — ABNORMAL HIGH (ref 11–307)

## 2021-10-25 LAB — VITAMIN B12: Vitamin B-12: 1063 pg/mL — ABNORMAL HIGH (ref 180–914)

## 2021-10-25 MED ORDER — MEGESTROL ACETATE 400 MG/10ML PO SUSP: 400.0000 mg | Freq: Every day | ORAL | 0 refills | Status: DC

## 2021-10-25 MED ORDER — ACETAMINOPHEN-CODEINE 300-30 MG PO TABS: 1.0000 | ORAL_TABLET | ORAL | 0 refills | Status: DC | PRN

## 2021-10-25 NOTE — Progress Notes (Signed)
[place]   [phone]) [phone] Fax:(336) [phone]   Clinic Follow up Note   Patient Care Team: Dettinger, [name], MD as PCP - General (Family Medicine) Zadie Rhine Clent Demark, MD as Consulting Physician (Ophthalmology) Donnie Mesa, MD as Consulting Physician (General Surgery) Truitt Merle, MD as Consulting Physician (Hematology)  Date of Service:  10/25/2021  CHIEF COMPLAINT: f/u of cholangiocarcinoma, h/o colon cancer  CURRENT THERAPY:  Surveillance  ASSESSMENT & PLAN:  Ashley Peters is a 88 y.o. female with   1. Hilar Cholangiocarcinoma, cTxN1M0 -Diagnosed in 02/2021, s/p ERCP and PTC -s/p SBRT under Dr. Lisbeth Renshaw 04/08/21 - 04/19/21. -post-treatment abdomen MRI on 07/11/21 showed: no significant change in liver masses; no lymphadenopathy or metastatic disease.  -CA 19-9 showed good response to treatment. -FO was negative for any targetable mutations. -given her advanced age and that she is asymptomatic, she is on surveillance. -most recent CEA from 08/26/21 showed return to normal limits. Today's result is pending. -Her overall health has declined lately. I discussed the option of palliative care with them today, which would involve a nurse coming to her house to help her and manage her medications. I explained the difference between this and hospice, which would involve more frequent visits and focuses on supportive care. They are interested in palliative care, but not ready for hospice. and I will make a referral today. -we also discussed the option of repeat scan. This would allow [place] to find symptomatic areas involved with cancer and possibly treat with radiation. I do feel observation at this point is also reasonable. They will consider this, especially given her back pain. -labs reviewed, CBC is WNL. CMP showed slightly elevated potassium at 5.3 and elevated alk phos at 262, otherwise WNL.  2. Back pain, Low appetite, Nausea, Fatigue -she reports h/o chronic back  pain that has worsened lately. They note worsening fatigue, causing her to lay down more, possibly contributing to the back pain. She notes tylenol sometimes controls the pain. I will call in tylenol #3 for her to use. -she continues to lose weight and has lost 10 lbs over 2 months, which likely also attributes to her fatigue. I encouraged her to supplement with nutrition shakes. I will also call in megace to help boost her appetite.   3. Obstructive Jaundice -s/p biliary drainage catheter placement on 02/27/21, tube is capped. -most recent exchanged on 09/20/21, next scheduled for 11/15/21 -t.bili has returned to normal limits.   4. Cancer of the right colon, invasive adenocarcinoma, G1, pT4bN0Mx stage IIA, MSI-stable  -Diagnosed in 09/2016. Genetic testing was negative. Treated with right hemicolectomy and adjuvant Xeloda. Currently on surveillance. -She had a colonoscopy on 12/23/17 with a few polyps removed. Due to her advanced age, no further surveillance colonoscopy was recommended.     5. B12 deficiency  -She currently receives monthly B12 injections with PCP.      Plan  -I called in tylenol #3 and megace. -referral to Home palliative care ([place]) -Lab and f/u in 3 months   No problem-specific Assessment & Plan notes found for this encounter.   SUMMARY OF ONCOLOGIC HISTORY: Oncology History Overview Note   Cancer Staging  Cancer of right colon [facility]) Staging form: Colon and Rectum, AJCC 8th Edition - Pathologic stage from 10/14/2016: Stage IIC (pT4b, pN0, cM0) - Signed by Truitt Merle, MD on 11/07/2016 Total positive nodes: 0 Tumor deposits (TD): Absent Perineural invasion (PNI): Absent Microsatellite instability (MSI): Stable  Hilar cholangiocarcinoma ([place]) Staging form:  Distal Bile Duct, AJCC 8th Edition - Clinical stage from 03/08/2021: Stage Unknown (cTX, cN1, cM0) - Signed by Truitt Merle, MD on 03/08/2021     Cancer of right colon ([place])  09/19/2016 Imaging    CT A/P 09/19/16 IMPRESSION: Large 9.5 cm intraluminal mass in the cecum and ascending colon, consistent with colon carcinoma. Tumor extension into adjacent pericolonic fat seen along the lateral wall.   Mild right pericolonic and mesenteric lymphadenopathy, suspicious for metastatic disease. No other sites of metastatic disease identified.   Colonic diverticulosis, without radiographic evidence of diverticulitis. Tiny hiatal hernia, and small epigastric ventral hernia containing only fat.   Aortic atherosclerosis.   10/14/2016 Initial Diagnosis   Cancer of right colon ([place])   10/14/2016 Surgery   OPEN RIGHT HEMICOLECTOMY and REPAIR OF EPIGASTRIC VENTRAL HERNIA by Dr. Georgette Dover and Dr. Dalbert Batman    10/14/2016 Pathology Results   10/14/16 Diagnosis Colon, segmental resection for tumor, Right ADENOCARCINOMA WITH EXTENSIVE EXTRA CELLULAR MUCIN, GRADE 1, (11.0 CM) THE TUMOR INVADES THROUGH THE CECUM WALL INTO ADJACENT TERMINAL ILEUM (PT4B) NINETEEN BENIGN LYMPH NODES (0/19) SUPPURATIVE INFLAMMATION WITH FIBROSIS AND ADHESION TO ABDOMINAL WALL NO ADENOCARCINOMA IDENTIFIED   11/19/2016 Imaging   CT Chest WO Contrast 11/19/16 IMPRESSION: 1. No evidence of metastatic disease. 2. Aortic atherosclerosis (ICD10-170.0). Coronary artery calcification.   11/25/2016 - 03/24/2017 Chemotherapy   Adjuvant Xeloda two weeks on, one week off starting 11/25/16 for 3 months     12/21/2016 Genetic Testing   Patient had genetic testing due to a personal history of colon cancer and a family history of breast, uterine, and colon cancer. The Common Hereditary Cancers Panel was ordered.  The Hereditary Gene Panel offered by [facility] includes sequencing and/or deletion duplication testing of the following 47 genes: APC, ATM, AXIN2, BARD1, BMPR1A, BRCA1, BRCA2, BRIP1, CDH1, CDKN2A (p14ARF), CDKN2A (p16INK4a), CHEK2, CDK4, CTNNA1, DICER1, EPCAM (Deletion/duplication testing only), GREM1 (promoter region  deletion/duplication testing only), KIT, MEN1, MLH1, MSH2, MSH3, MSH6, MUTYH, NBN, NF1, NHTL1, PALB2, PDGFRA, PMS2, POLD1, POLE, PTEN, RAD50, RAD51C, RAD51D, SDHB, SDHC, SDHD, SMAD4, SMARCA4. STK11, TP53, TSC1, TSC2, and VHL.  The following genes were evaluated for sequence changes only: SDHA and HOXB13 c.251G>A variant only.  Results: Negative- no pathogenic variants identified.  The date of this test report is 12/21/2016.    04/27/2017 Imaging   CT AP W Contrast 04/27/17 IMPRESSION: Status post right hemicolectomy. No evidence of recurrent or metastatic disease. Additional stable ancillary findings as above.   08/07/2017 Imaging   08/07/2017 DEXA ASSESSMENT: The BMD measured at Forearm Radius 33% is 0.543 g/cm2 with a T-score of -3.9. This patient is considered osteoporotic according to [facility]) criteria.   11/25/2017 Imaging    11/25/2017 CT CAP IMPRESSION: 1. Abnormal appearance of the right lobe of the liver, with development of progressive peripheral intrahepatic duct dilatation with ill definition of the more central right hepatic duct. This appearance is indeterminate. Considerations include an incidental primary liver lesion such as cholangiocarcinoma, an otherwise occult liver metastasis with secondary biliary duct dilatation, or infectious/inflammatory cholangitis. If this 88 year old can undergo MRI/MRCP, this should be considered. Multiphase CT (with delayed phase imaging to evaluate for possible cholangiocarcinoma) and/or focused ultrasound would be alternate imaging strategies. 2. Otherwise, no evidence of metastatic disease in the chest, abdomen, or pelvis. 3. Coronary artery atherosclerosis. Aortic Atherosclerosis (ICD10-I70.0).   12/10/2017 Imaging   12/10/2017 MRI Abdomen IMPRESSION: 1. Stricturing of the central bile ducts of segment V RIGHT hepatic lobe without clear lesion  identified. Differential would include benign and malignant stricturing  including cholangiocarcinoma. Evaluation is hampered by clip artifact from cholecystectomy clips and duodenum gas. FDG PET scan may or may not be helpful in identify lesion. ERCP would be a second option to evaluate this central obstruction. 2. No enhancing lesion the in the liver parenchyma parenchyma. Altered perfusion related to the biliary obstruction.   12/23/2017 Procedure   12/23/2017 Colonoscopy Impressions: - Decreased sphincter tone found on digital rectal exam. - Two 3 mm polyps in the rectum and in the descending colon, removed with a cold snare. Resected and retrieved. - One 8 mm polyp in the rectum, removed with a hot snare. Resected and retrieved. - Diverticulosis in the entire examined colon. - The examination was otherwise normal on direct and retroflexion views.   08/24/2018 Imaging   CT AP  IMPRESSION: 1. Over the last 9 months there has been a similar not significantly progressive appearance of biliary dilatation and complexity in segment 5 of the liver along with mild intrahepatic biliary dilatation elsewhere, and moderate extrahepatic biliary dilatation. Some of this is likely postinflammatory; the most complex and dilated bile duct did not have definite enhancement on the recent MRI to definitively indicate cholangiocarcinoma or metastatic lesion but the appearance of the right hepatic lobe is progressive from 09/19/2016 and from 04/27/2017. In the context of the patient's colon cancer, nuclear medicine PET-CT may provide some helpful adjunct information given this unusual appearance. 2. Other imaging findings of potential clinical significance: Mitral calcification. Aortoiliac atherosclerotic vascular disease. Renal cysts. Trace amount of gas in the urinary bladder, query recent catheterization. Descending and sigmoid colon diverticulosis. Two ventral hernias are enlarged compared to the prior exam but lax with wide ostium. No overt pathologic adenopathy  currently.   08/24/2019 Imaging   CT CAP w contrast  IMPRESSION: 1. Right hemicolectomy. No evidence of recurrent or metastatic disease. 2. Intrahepatic/extrahepatic biliary ductal dilatation, unchanged from 11/25/2017. 3. Multiple midline supraumbilical ventral hernias contain unobstructed bowel or fat. 4. Aortic atherosclerosis (ICD10-I70.0). Coronary artery calcification.   09/20/2020 Imaging   MRI Abdomen  IMPRESSION: 1. Irregular peripheral enhancement and progressive enhancement of the lesion along the hepatic margin suspicious for peribiliary spread of colonic neoplasm (metastatic disease from colon primary) given findings on prior studies versus is cholangiocarcinoma. 2. Central biliary cast and stones filling the dilated common bile duct with diffuse biliary duct dilation. No visible enhancement to suggest tumor in these areas though assessment is limited due to clip artifact. Tumor extending into the RIGHT hepatic duct is difficult to exclude given the presence of clip artifact but on image 26 of series 33 there is suggestion of enhancing material within the lumen. 3. Slight increase in size of celiac lymph nodes dating back to 2021, suspicious in light of other findings. No retroperitoneal adenopathy.   12/04/2020 Imaging   MRI Abdomen  IMPRESSION: 1. Interval extraction of multiple common bile duct calculi with persistent intra and extrahepatic biliary ductal dilatation, similar in caliber to prior examination.   2. A large calculus remains in the intrahepatic common bile duct measuring at least 2.6 cm.   3. No significant change in heterogeneously enhancing, somewhat ill-defined lesions of the anterior right lobe of the liver, hepatic segment V, or within the central liver. As on prior examination, these remain consistent with either metastases from patient's known colon malignancy or alternately cholangiocarcinoma.   Hilar cholangiocarcinoma ([place])  12/22/2020 PET  scan   IMPRESSION: 1. 9 mm ground-glass nodule in the  posterior right lower lobe is hypermetabolic. This nodule is new since 09/19/2020 while potentially infectious/inflammatory, primary bronchogenic neoplasm or metastatic disease could have this appearance. Close follow-up recommended. 2. 2 areas of low level hypermetabolism in the liver without obvious underlying correlate on CT imaging. Metastatic disease remains a concern. 3.  Aortic Atherosclerois (ICD10-170.0)   02/26/2021 Pathology Results   FINAL MICROSCOPIC DIAGNOSIS:  A. COMMON BILE DUCT, BIOPSY:  -  Poorly differentiated carcinoma    A. COMMON BILE DUCT, BRUSHING:  FINAL MICROSCOPIC DIAGNOSIS:  - Malignant cells consistent with adenocarcinoma    02/26/2021 Procedure   ERCP, Dr. Meridee Score  Impression: - The major papilla was located entirely within a diverticulum. The major papilla appeared edematous and had a prior biliary sphincterotomy. - Difficult cannulation as wire had preferential placement into the cystic stump but eventually wire was able to go into the biliary tree partially. Could not attain deep selective cannulation of proximal biliary tree. - A biliary tract obstruction was found in the upper third of the main duct as a result of mass-like area on fluoroscopy. - A biliary sphincterotomy extension was performed and this allowed for angled wire placement. - The biliary tree was swept and sludge was found. - Cells for cytology obtained in the middle third of the main bile duct with 2 brushes. - Spyglass cholangioscopy performed. 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 This could not be traversed. This was sampled via Spybite. - Unable to access proximal biliary tree, so could not place any stents unfortunately.   03/08/2021 Initial Diagnosis   Hilar cholangiocarcinoma ([facility])   03/08/2021 Cancer Staging   Staging form: Distal Bile Duct, AJCC  8th Edition - Clinical stage from 03/08/2021: Stage Unknown (cTX, cN1, cM0) - Signed by Malachy Mood, MD on 03/08/2021   04/01/2021 Imaging   EXAM: CT CHEST WITH CONTRAST  IMPRESSION: 1. Ill-defined nodules in the right lung which could be of infectious or inflammatory etiology, although underlying neoplasm is not entirely excluded. The largest of these is a sub solid 9 x 8 mm nodule in the right middle lobe. Non-contrast chest CT at 3-6 months is recommended. If nodules persist, subsequent management will be based upon the most suspicious nodule(s). This recommendation follows the consensus statement: Guidelines for Management of Incidental Pulmonary Nodules Detected on CT Images: From the Fleischner Society 2017; Radiology 2017; 284:228-243. 2. Infiltrative intrahepatic neoplasm with invasion into the common bile duct again noted, presumably reflective of cholangiocarcinoma. 3. Aortic atherosclerosis, in addition to left anterior descending coronary artery disease.      INTERVAL HISTORY:  Ashley Peters is here for a follow up of cholangiocarcinoma. She was last seen by me on 08/26/21. She presents to the clinic accompanied by her daughter. Her daughter reports Ashley Peters is still having trouble eating. Ashley Peters explains it's mostly due to lack of appetite but sometimes nausea. She also reports back pain and fatigue, endorses taking tylenol for pain. Her daughter explains she is laying down more often than she used to. Ashley Peters notes the pain is less when she lays down. She denies radiation of the pain.   All other systems were reviewed with the patient and are negative.  MEDICAL HISTORY:  Past Medical History:  Diagnosis Date   Anemia    Blood transfusion without reported diagnosis    Cancer (HCC)    Colon cancer ([facility]) dx'd 10/2016   Cystoid macular edema of left eye 07/05/2019   Diverticulosis  Family history of breast cancer    Family history of colon cancer    Family history of uterine  cancer    GERD (gastroesophageal reflux disease)    History of kidney stones    Hyperlipidemia    Hypertension    Macular degeneration    Osteopenia     SURGICAL HISTORY: Past Surgical History:  Procedure Laterality Date   ABDOMINAL HYSTERECTOMY     ablation tr Great saphenous vein  10/17/2009   BILIARY BRUSHING  02/26/2021   Procedure: BILIARY BRUSHING;  Surgeon: Irving Copas., MD;  Location: [facility];  Service: Gastroenterology;;   BIOPSY  02/26/2021   Procedure: BIOPSY;  Surgeon: Irving Copas., MD;  Location: [facility];  Service: Gastroenterology;;   BREAST EXCISIONAL BIOPSY Bilateral 1971   brest biopsy  [place] N/A 10/14/2016   Procedure: REPAIR OF EPIGASTRIC VENTRAL HERNIA;  Surgeon: Donnie Mesa, MD;  Location: [place];  Service: General;  Laterality: N/A;   ERCP N/A 09/21/2020   Procedure: ENDOSCOPIC RETROGRADE CHOLANGIOPANCREATOGRAPHY (ERCP);  Surgeon: Gatha Mayer, MD;  Location: [facility];  Service: Endoscopy;  Laterality: N/A;   ERCP N/A 02/26/2021   Procedure: ENDOSCOPIC RETROGRADE CHOLANGIOPANCREATOGRAPHY (ERCP);  Surgeon: Irving Copas., MD;  Location: [facility];  Service: Gastroenterology;  Laterality: N/A;   IR EXCHANGE BILIARY DRAIN  05/31/2021   IR EXCHANGE BILIARY DRAIN  07/26/2021   IR EXCHANGE BILIARY DRAIN  09/20/2021   IR PERCUTANEOUS TRANSHEPATIC CHOLANGIOGRAM  02/27/2021   lt. distal ureteral stone extraction  09/14/1991   PARTIAL COLECTOMY Right 10/14/2016   Procedure: OPEN RIGHT HEMICOLECTOMY;  Surgeon: Donnie Mesa, MD;  Location: [place];  Service: General;  Laterality: Right;   REMOVAL OF STONES  09/21/2020   Procedure: REMOVAL OF STONES;  Surgeon: Gatha Mayer, MD;  Location: [facility];  Service: Endoscopy;;   REMOVAL OF STONES  02/26/2021   Procedure: REMOVAL OF SLUDGE;  Surgeon: Irving Copas., MD;  Location: [facility];  Service: Gastroenterology;;    [name] Mayans  09/21/2020   Procedure: [name] Mayans;  Surgeon: Gatha Mayer, MD;  Location: [facility];  Service: Endoscopy;;   SPHINCTEROTOMY  02/26/2021   Procedure: [name] Mayans;  Surgeon: Mansouraty, Telford Nab., MD;  Location: [facility];  Service: Gastroenterology;;   [facility] CHOLANGIOSCOPY N/A 02/26/2021   Procedure: [id number] CHOLANGIOSCOPY;  Surgeon: Irving Copas., MD;  Location: [facility];  Service: Gastroenterology;  Laterality: N/A;   STONE EXTRACTION WITH BASKET  09/21/2020   Procedure: STONE EXTRACTION WITH BASKET;  Surgeon: Gatha Mayer, MD;  Location: [facility];  Service: Endoscopy;;    I have reviewed the social history and family history with the patient and they are unchanged from previous note.  ALLERGIES:  is allergic to benicar [olmesartan medoxomil] and amoxicillin.  MEDICATIONS:  Current Outpatient Medications  Medication Sig Dispense Refill   acetaminophen-codeine (TYLENOL #3) 300-30 MG tablet Take 1 tablet by mouth every 4 (four) hours as needed for moderate pain. 60 tablet 0   megestrol (MEGACE) 400 MG/10ML suspension Take 10 mLs (400 mg total) by mouth daily. 240 mL 0   acetaminophen (TYLENOL) 500 MG tablet Take 2 tablets (1,000 mg total) by mouth every 6 (six) hours as needed for moderate pain. (Patient taking differently: Take 500-1,000 mg by mouth every 6 (six) hours as needed for moderate pain.) 30 tablet 0   alendronate (FOSAMAX) 70 MG tablet Take with a full glass of water on an empty  stomach. 12 tablet 3   Cyanocobalamin 1000 MCG/ML KIT Inject 1,000 mcg as directed every 30 (thirty) days.     Multiple Vitamins-Minerals (PRESERVISION AREDS 2 PO) Take 1 capsule by mouth 2 (two) times daily.     ondansetron (ZOFRAN) 8 MG tablet Take 1 tablet (8 mg total) by mouth every 8 (eight) hours as needed for nausea or vomiting. 60 tablet 3   prochlorperazine (COMPAZINE) 5 MG tablet Take 1-2 tablets (5-10 mg total) by mouth every 6 (six)  hours as needed for nausea or vomiting. 30 tablet 0   Sodium Chloride Flush (NORMAL SALINE FLUSH) 0.9 % SOLN Flush drain once a daily with 5 MLs. Then discard the remainder 300 mL 3   sodium chloride flush (NS) 0.9 % SOLN Flush abdominal drain with 5 mL once daily. 300 mL 8   Current Facility-Administered Medications  Medication Dose Route Frequency Provider Last Rate Last Admin   cyanocobalamin ((VITAMIN B-12)) injection 1,000 mcg  1,000 mcg Intramuscular Q30 days Dettinger, [name], MD   1,000 mcg at 10/17/21 0800    PHYSICAL EXAMINATION: ECOG PERFORMANCE STATUS: 3 - Symptomatic, >50% confined to bed  Vitals:   10/25/21 0902  BP: 123/80  Pulse: 92  Resp: 18  Temp: 97.6 F (36.4 C)  SpO2: 98%   Wt Readings from Last 3 Encounters:  10/25/21 89 lb (40.4 kg)  08/26/21 99 lb 8 oz (45.1 kg)  07/12/21 105 lb 3 oz (47.7 kg)     GENERAL:alert, no distress and comfortable SKIN: skin color normal, no rashes or significant lesions EYES: normal, Conjunctiva are pink and non-injected, sclera clear  NEURO: alert & oriented x 3 with fluent speech  LABORATORY DATA:  I have reviewed the data as listed    Latest Ref Rng & Units 10/25/2021    8:09 AM 08/26/2021    7:43 AM 07/12/2021    8:08 AM  CBC  WBC 4.0 - 10.5 K/uL 7.9  6.1  7.2   Hemoglobin 12.0 - 15.0 g/dL 14.8  15.0  15.9   Hematocrit 36.0 - 46.0 % 43.8  45.2  47.3   Platelets 150 - 400 K/uL 178  193  191         Latest Ref Rng & Units 10/25/2021    8:10 AM 08/26/2021    7:43 AM 07/12/2021    8:08 AM  CMP  Glucose 70 - 99 mg/dL 128  87  105   BUN 8 - 23 mg/dL _0 Creatinine 0.44 - 1.00 mg/dL 0.83  0.86  0.81   Sodium 135 - 145 mmol/L 136  141  140   Potassium 3.5 - 5.1 mmol/L 5.3  3.6  4.5   Chloride 98 - 111 mmol/L 104  106  106   CO2 22 - 32 mmol/L _1 Calcium 8.9 - 10.3 mg/dL 9.9  10.0  9.8   Total Protein 6.5 - 8.1 g/dL 6.5  6.9  7.1   Total Bilirubin 0.3 - 1.2 mg/dL 1.1  0.6  0.9   Alkaline Phos 38  - 126 U/L 262  91  68   AST 15 - 41 U/L 41  20  22   ALT 0 - 44 U/L 33  10  12       RADIOGRAPHIC STUDIES: I have personally reviewed the radiological images as listed and agreed with the findings in the report. No results found.    Orders Placed  This Encounter  Procedures   CEA (IN HOUSE-[facility])   All questions were answered. The patient knows to call the clinic with any problems, questions or concerns. No barriers to learning was detected. The total time spent in the appointment was 30 minutes.     Truitt Merle, MD 10/25/2021   I, Wilburn Mylar, am acting as scribe for Truitt Merle, MD.   I have reviewed the above documentation for accuracy and completeness, and I agree with the above.

## 2021-10-26 LAB — CANCER ANTIGEN 19-9: CA 19-9: 70 U/mL — ABNORMAL HIGH (ref 0–35)

## 2021-10-28 ENCOUNTER — Encounter (INDEPENDENT_AMBULATORY_CARE_PROVIDER_SITE_OTHER): Payer: Medicare Other | Admitting: Ophthalmology

## 2021-10-28 ENCOUNTER — Telehealth: Payer: Self-pay

## 2021-10-28 NOTE — Telephone Encounter (Signed)
Spoke with patient's daughter Ashley Peters and scheduled a Mychart Palliative Consult for 11/04/21 @ 1:30 PM.  Consent obtained; updated [employer], Team List and Epic.

## 2021-11-01 ENCOUNTER — Ambulatory Visit: Payer: Medicare Other | Admitting: Family Medicine

## 2021-11-01 ENCOUNTER — Encounter: Payer: Self-pay | Admitting: Family Medicine

## 2021-11-01 ENCOUNTER — Other Ambulatory Visit (HOSPITAL_COMMUNITY): Payer: Medicare Other

## 2021-11-04 ENCOUNTER — Telehealth: Payer: Medicare Other | Admitting: Family Medicine

## 2021-11-04 ENCOUNTER — Encounter: Payer: Self-pay | Admitting: Family Medicine

## 2021-11-04 ENCOUNTER — Encounter (INDEPENDENT_AMBULATORY_CARE_PROVIDER_SITE_OTHER): Payer: Medicare Other | Admitting: Ophthalmology

## 2021-11-04 DIAGNOSIS — C240 Malignant neoplasm of extrahepatic bile duct: Secondary | ICD-10-CM

## 2021-11-04 DIAGNOSIS — R634 Abnormal weight loss: Secondary | ICD-10-CM

## 2021-11-04 DIAGNOSIS — R64 Cachexia: Secondary | ICD-10-CM

## 2021-11-04 DIAGNOSIS — R110 Nausea: Secondary | ICD-10-CM | POA: Insufficient documentation

## 2021-11-04 NOTE — Progress Notes (Signed)
[facility] Consult Note Telephone: [phone]  Fax: [phone]   Date of encounter: 11/04/21 1:40 PM PATIENT NAME: Ashley Peters [place] [id number]   [phone] (home)  DOB: October 02, 1928 MRN: [id number] PRIMARY CARE PROVIDER:    Dettinger, [name], MD,  [place] [address] [phone]  REFERRING PROVIDER:   Dettinger, [name], MD [address] [place],  [place] [address] [phone]  RESPONSIBLE PARTY:    Contact Information     Name Relation Home Work [place] Daughter [phone]  [phone]   [place], [place] [phone]  [phone]      I connected with  ANSHI JALLOH and her daughter Karlton Lemon on 11/04/21 by a video enabled telemedicine application and verified that I am speaking with the correct person using two identifiers.   I discussed the limitations of evaluation and management by telemedicine. The patient expressed understanding and agreed to proceed.   Palliative Care was asked to follow this patient by consultation request of  Dettinger, [name], MD to address advance care planning and complex medical decision making. This is the initial visit.          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  2.  Cachexia, nausea and weight loss Just started on Megace, advised may need titration of dose. Encouraged standing dosing of Zofran 1-2 hours before normal onset of nausea in evening to see if this helps then follow about 1 hour later with some food. Try small, frequent low fat meals every 2-3 hours while awake to see if this helps decrease nausea Didn't like any protein supplements-encouraged to retry by making a milkshake with the supplement using ice cream or sherbert to see if this makes  it more tolerable. Add protein powder to fruit smoothies to improve protein intake Try to maintain consistent intake of fluids to prevent dehydration which could also contribute to nausea   Follow up Palliative Care Visit: Palliative care will continue to follow for complex medical decision making, advance care planning, and clarification of goals. Return 3 weeks or prn.    This visit was coded based on medical decision making (MDM).  PPS: 40%  HOSPICE ELIGIBILITY/DIAGNOSIS: TBD  Chief Complaint:  [facility] received a referral to follow up with patient for chronic disease management in setting of hx of colon cancer and recent onset of extrahepatic biliary duct cholangiocarcinoma.  Palliative Care is also following to assist with advance directive planning and defining/refining goals of care.  HISTORY OF PRESENT ILLNESS:  Ashley Peters is a 88 y.o. year old female with diagnosis of extrahepatic biliary cholangiocarcinoma by bx in December 2022 when she was noted to have obstruction and both direct and indirect bili were high with a total around 18. Ttube was percutaneously placed per IR. Pt also has hx of colon cancer with partial colectomy and hx of chemo and radiation.  She has a hx of HTN, GERD, osteoporosis without fracture, HLD, chronic blood loss anemia, Vitamin B12 deficiency, macular degeneration and Vitamin D deficiency. Getting sick when she eats and increased fatigue, eating less.  Lost down to about 89 lbs.  She does not vomit but has dry heaves.  Nothing tastes good to her.  Has ondansetron pills but has been vomiting. Started on Megace  a couple of days ago. Doesn't like the supplements and has tried Ensure, Carnation instant breakfast.  Does like beans.  Staying away from sweets.  Has a t- tube in place in bile duct.  She is mobile. Bathing and dressing herself, weaker than she has been.  Was still driving but not currently.  Pain in her back.  She is  laying down for a lot during the day and sleeps.  Denies bright red or dark tarry stools, no coffee ground emesis.  Having issues with constipation. She has not fallen, now using a walker for ambulation.  Denies depression. Able to control bowel or bladder.    History obtained from review of EMR, discussion with daughter and/or Ms. Marshell Levan.   10/25/21 Elevated ferritin 334, low TIBC 210, iron low normal at 35 CMP remarkable for elevated alk phos 262, BUN 25 (normal Cr 0.83, with normal eGFR > 60.  Elevated K+ 5.3 Vitamin B12 slightly elevated at 1063 CBC unremarkable Component Ref Range & Units 10 d ago 10/25/21 2 mo ago 8 mo ago 10 mo ago  CA 19-9 0 - 35 U/mL 70 High   28 CM  302 High  CM  53 High  CM    Last had biliary drain changed by IR 09/20/21 02/26/21 Surgical path from biliary tract obstruction: poorly differentiated adenocarcinoma  I reviewed available labs, medications, imaging, studies and related documents from the EMR.  Records reviewed and summarized above.   ROS General: Fatigued ENMT: denies dysphagia Cardiovascular: denies chest pain, denies DOE Pulmonary: denies cough, denies increased SOB Abdomen: endorses poor appetite/weight loss, intermittent nausea and dry heaves, endorses constipation, endorses continence of bowel GU: denies dysuria, endorses continence of urine MSK:  endorses increased generalized weakness, no falls reported, using walker for increased stability Neurological: endorses intermittent low back pain, sleeping more than usual Psych: Denies depression Heme/lymph/immuno: denies abnormal bleeding  Physical Exam: limited to observation and audibility by phone/video Current and past weights: current weight 89 lbs as of 10/25/21 with loss from 108 lbs 9.6 oz 05/31/21 Constitutional: NAD, appears pale and fatigued General: frail appearing, thin CV: No visible cyanosis, able to speak in complete sentences without having to stop to breathe Pulmonary: no  increased work of breathing, no cough, room air Psych: non-anxious affect, A and O x 3   CURRENT PROBLEM LIST:  Patient Active Problem List   Diagnosis Date Noted   Hilar cholangiocarcinoma ([facility]) 03/08/2021   Advanced nonexudative age-related macular degeneration of left eye with subfoveal involvement 04/17/2020   Age-related osteoporosis without current pathological fracture 02/22/2020   Vitamin D deficiency 02/22/2020   Advanced nonexudative age-related macular degeneration of right eye with subfoveal involvement 08/01/2019   Exudative age-related macular degeneration of left eye with inactive choroidal neovascularization ([facility]) 07/05/2019   Vitreomacular adhesion of left eye 07/05/2019   B12 deficiency 03/24/2017   History of colon cancer    Family history of uterine cancer    Family history of breast cancer    Iron deficiency anemia due to chronic blood loss 11/07/2016   Cancer of right colon ([facility]) 10/14/2016   Hyperlipidemia 11/01/2014   HTN (hypertension) 07/15/2010   GERD (gastroesophageal reflux disease) 07/15/2010   PAST MEDICAL HISTORY:  Active Ambulatory Problems    Diagnosis Date Noted   HTN (hypertension) 07/15/2010   GERD (gastroesophageal reflux disease) 07/15/2010   Hyperlipidemia 11/01/2014   Cancer of right colon ([facility]) 10/14/2016   Iron deficiency anemia due to chronic blood loss 11/07/2016  History of colon cancer    Family history of uterine cancer    Family history of breast cancer    B12 deficiency 03/24/2017   Exudative age-related macular degeneration of left eye with inactive choroidal neovascularization ([facility]) 07/05/2019   Vitreomacular adhesion of left eye 07/05/2019   Advanced nonexudative age-related macular degeneration of right eye with subfoveal involvement 08/01/2019   Age-related osteoporosis without current pathological fracture 02/22/2020   Vitamin D deficiency 02/22/2020   Advanced nonexudative age-related macular degeneration of left eye  with subfoveal involvement 04/17/2020   Hilar cholangiocarcinoma ([facility]) 03/08/2021   Resolved Ambulatory Problems    Diagnosis Date Noted   Hyperlipemia 07/15/2010   Osteopenia 07/15/2010   Diverticulosis 07/15/2010   Genetic testing 12/23/2016   Exudative age-related macular degeneration of right eye with inactive choroidal neovascularization ([facility]) 07/05/2019   Cystoid macular edema of left eye 07/05/2019   Hepatitis 09/19/2020   LFT elevation 09/20/2020   Abnormal LFTs 09/20/2020   Epigastric pain 09/20/2020   Common bile duct (CBD) obstruction    Jaundice    Calculus of bile duct with cholangitis and obstruction    Hyperbilirubinemia 02/25/2021   Past Medical History:  Diagnosis Date   Anemia    Blood transfusion without reported diagnosis    Cancer ([facility])    Colon cancer ([facility]) dx'd 10/2016   Family history of colon cancer    History of kidney stones    Hypertension    Macular degeneration    SOCIAL HX:  Social History   Tobacco Use   Smoking status: Never   Smokeless tobacco: Never  Substance Use Topics   Alcohol use: No   FAMILY HX:  Family History  Problem Relation Age of Onset   Stroke Father 17   Breast cancer Sister        dx 93's, died at 84   Seizures Son    Diabetes Brother    Kidney disease Brother    Heart disease Brother    Epilepsy Son    Colon cancer Other 57   Uterine cancer Other 32   Hodgkin's lymphoma Other    Breast cancer Other        Preferred Pharmacy: ALLERGIES:  Allergies  Allergen Reactions   Benicar [Olmesartan Medoxomil] Other (See Comments)    Elevated Potassium   Amoxicillin Rash    Has patient had a PCN reaction causing immediate rash, facial/tongue/throat swelling, SOB or lightheadedness with hypotension: Yes Has patient had a PCN reaction causing severe rash involving mucus membranes or skin necrosis: No Has patient had a PCN reaction that required hospitalization: No Has patient had a PCN reaction occurring within  the last 10 years: No If all of the above answers are "NO", then may proceed with Cephalosporin use.     PERTINENT MEDICATIONS:  Outpatient Encounter Medications as of 11/04/2021  Medication Sig   acetaminophen (TYLENOL) 500 MG tablet Take 2 tablets (1,000 mg total) by mouth every 6 (six) hours as needed for moderate pain. (Patient taking differently: Take 500-1,000 mg by mouth every 6 (six) hours as needed for moderate pain.)   acetaminophen-codeine (TYLENOL #3) 300-30 MG tablet Take 1 tablet by mouth every 4 (four) hours as needed for moderate pain.   alendronate (FOSAMAX) 70 MG tablet Take with a full glass of water on an empty stomach.   Cyanocobalamin 1000 MCG/ML KIT Inject 1,000 mcg as directed every 30 (thirty) days.   megestrol (MEGACE) 400 MG/10ML suspension Take 10 mLs (400 mg total) by  mouth daily.   Multiple Vitamins-Minerals (PRESERVISION AREDS 2 PO) Take 1 capsule by mouth 2 (two) times daily.   ondansetron (ZOFRAN) 8 MG tablet Take 1 tablet (8 mg total) by mouth every 8 (eight) hours as needed for nausea or vomiting.   prochlorperazine (COMPAZINE) 5 MG tablet Take 1-2 tablets (5-10 mg total) by mouth every 6 (six) hours as needed for nausea or vomiting.   Sodium Chloride Flush (NORMAL SALINE FLUSH) 0.9 % SOLN Flush drain once a daily with 5 MLs. Then discard the remainder   sodium chloride flush (NS) 0.9 % SOLN Flush abdominal drain with 5 mL once daily.   Facility-Administered Encounter Medications as of 11/04/2021  Medication   cyanocobalamin ((VITAMIN B-12)) injection 1,000 mcg     ---------------------------------------------------------------------------------------- Advance Care Planning/Goals of Care: Goals include to maximize quality of life and symptom management. Patient/health care surrogate gave his/her permission to discuss.Our advance care planning conversation included a discussion about:    Exploration of personal, cultural or spiritual beliefs that might  influence medical decisions  Daughter states that Oncologist Dr Burr Medico wanted to refer pt to Palliative Care and indicated she is not ready for Hospice level of care at present but would benefit from monthly visits for symptom management.    Thank you for the opportunity to participate in the care of Ms. Marshell Levan.  The palliative care team will continue to follow. Please call our office at [phone] if we can be of additional assistance.   Marijo Conception, FNP-C  COVID-19 PATIENT SCREENING TOOL Asked and negative response unless otherwise noted:  Have you had symptoms of covid, tested positive or been in contact with someone with symptoms/positive test in the past 5-10 days?

## 2021-11-15 ENCOUNTER — Other Ambulatory Visit (HOSPITAL_COMMUNITY): Payer: Self-pay | Admitting: Interventional Radiology

## 2021-11-15 ENCOUNTER — Ambulatory Visit (HOSPITAL_COMMUNITY)
Admission: RE | Admit: 2021-11-15 | Discharge: 2021-11-15 | Disposition: A | Discharge status: Home / Self Care | Payer: Medicare Other | Source: Ambulatory Visit | Attending: Interventional Radiology | Admitting: Interventional Radiology

## 2021-11-15 DIAGNOSIS — C221 Intrahepatic bile duct carcinoma: Secondary | ICD-10-CM | POA: Insufficient documentation

## 2021-11-15 DIAGNOSIS — Z434 Encounter for attention to other artificial openings of digestive tract: Secondary | ICD-10-CM | POA: Diagnosis not present

## 2021-11-15 HISTORY — PX: IR EXCHANGE BILIARY DRAIN: IMG6046

## 2021-11-15 MED ORDER — LIDOCAINE HCL 1 % IJ SOLN
INTRAMUSCULAR | Status: AC
  Filled 2021-11-15: qty 20

## 2021-11-15 MED ORDER — IOHEXOL 300 MG/ML  SOLN
20.0000 mL | Freq: Once | INTRAMUSCULAR | Status: AC | PRN
Start: 2021-11-15 — End: 2021-11-15
  Administered 2021-11-15: 10 mL

## 2021-11-16 ENCOUNTER — Other Ambulatory Visit: Payer: Self-pay | Admitting: Hematology

## 2021-11-16 DIAGNOSIS — C240 Malignant neoplasm of extrahepatic bile duct: Secondary | ICD-10-CM

## 2021-11-18 ENCOUNTER — Other Ambulatory Visit (HOSPITAL_COMMUNITY): Payer: Self-pay

## 2021-11-18 ENCOUNTER — Ambulatory Visit (INDEPENDENT_AMBULATORY_CARE_PROVIDER_SITE_OTHER): Payer: Medicare Other | Admitting: *Deleted

## 2021-11-18 DIAGNOSIS — E538 Deficiency of other specified B group vitamins: Secondary | ICD-10-CM

## 2021-11-18 NOTE — Progress Notes (Signed)
B12 injection Left deltoid pt tolerated well.

## 2021-11-22 ENCOUNTER — Ambulatory Visit (INDEPENDENT_AMBULATORY_CARE_PROVIDER_SITE_OTHER): Payer: Medicare Other

## 2021-11-22 VITALS — Wt 89.0 lb

## 2021-11-22 DIAGNOSIS — Z0000 Encounter for general adult medical examination without abnormal findings: Secondary | ICD-10-CM

## 2021-11-22 NOTE — Patient Instructions (Addendum)
[name] , Thank you for taking time to come for your Medicare Wellness Visit. I appreciate your ongoing commitment to your health goals. Please review the following plan we discussed and let me know if I can assist you in the future.   Screening recommendations/referrals: Colonoscopy: Done 12/23/2017 - no repeat due to age 88: Done 03/01/2021 - Repeat annually Bone Density: Done 02/24/2020 - Repeat every 2 years  Recommended yearly ophthalmology/optometry visit for glaucoma screening and checkup Recommended yearly dental visit for hygiene and checkup  Vaccinations: Influenza vaccine: Done 12/14/2020 - Repeat annually  Pneumococcal vaccine: Done 03/16/2007 & 06/23/2016  Tdap vaccine: Done 08/06/2017 - Repeat in 10 years Shingles vaccine: Zostavax done 2013 - ask Oncologist about Shingrix   Covid-19: Done 04/16/2019, 05/10/2019, & 12/13/2019 - consider booster Fall 2023  Advanced directives: Advance directive discussed with you today. Even though you declined this today, please call our office should you change your mind, and we can give you the proper paperwork for you to fill out.   Conditions/risks identified: Try to eat several small snacks daily, drink 6 glasses of water daily    Next appointment: Follow up in one year for your annual wellness visit    Preventive Care 65 Years and Older, Female Preventive care refers to lifestyle choices and visits with your health care provider that can promote health and wellness. What does preventive care include? A yearly physical exam. This is also called an annual well check. Dental exams once or twice a year. Routine eye exams. Ask your health care provider how often you should have your eyes checked. Personal lifestyle choices, including: Daily care of your teeth and gums. Regular physical activity. Eating a healthy diet. Avoiding tobacco and drug use. Limiting alcohol use. Practicing safe sex. Taking low-dose aspirin every  day. Taking vitamin and mineral supplements as recommended by your health care provider. What happens during an annual well check? The services and screenings done by your health care provider during your annual well check will depend on your age, overall health, lifestyle risk factors, and family history of disease. Counseling  Your health care provider may ask you questions about your: Alcohol use. Tobacco use. Drug use. Emotional well-being. Home and relationship well-being. Sexual activity. Eating habits. History of falls. Memory and ability to understand (cognition). Work and work Statistician. Reproductive health. Screening  You may have the following tests or measurements: Height, weight, and BMI. Blood pressure. Lipid and cholesterol levels. These may be checked every 5 years, or more frequently if you are over 25 years old. Skin check. Lung cancer screening. You may have this screening every year starting at age 101 if you have a 30-pack-year history of smoking and currently smoke or have quit within the past 15 years. Fecal occult blood test (FOBT) of the stool. You may have this test every year starting at age 48. Flexible sigmoidoscopy or colonoscopy. You may have a sigmoidoscopy every 5 years or a colonoscopy every 10 years starting at age 22. Hepatitis C blood test. Hepatitis B blood test. Sexually transmitted disease (STD) testing. Diabetes screening. This is done by checking your blood sugar (glucose) after you have not eaten for a while (fasting). You may have this done every 1-3 years. Bone density scan. This is done to screen for osteoporosis. You may have this done starting at age 77. Mammogram. This may be done every 1-2 years. Talk to your health care provider about how often you should have regular mammograms. Talk with  your health care provider about your test results, treatment options, and if necessary, the need for more tests. Vaccines  Your health care  provider may recommend certain vaccines, such as: Influenza vaccine. This is recommended every year. Tetanus, diphtheria, and acellular pertussis (Tdap, Td) vaccine. You may need a Td booster every 10 years. Zoster vaccine. You may need this after age 43. Pneumococcal 13-valent conjugate (PCV13) vaccine. One dose is recommended after age 45. Pneumococcal polysaccharide (PPSV23) vaccine. One dose is recommended after age 92. Talk to your health care provider about which screenings and vaccines you need and how often you need them. This information is not intended to replace advice given to you by your health care provider. Make sure you discuss any questions you have with your health care provider. Document Released: 03/23/2015 Document Revised: 11/14/2015 Document Reviewed: 12/26/2014 Elsevier Interactive Patient Education  2017 [employer] Prevention in the Home Falls can cause injuries. They can happen to people of all ages. There are many things you can do to make your home safe and to help prevent falls. What can I do on the outside of my home? Regularly fix the edges of walkways and driveways and fix any cracks. Remove anything that might make you trip as you walk through a door, such as a raised step or threshold. Trim any bushes or trees on the path to your home. Use bright outdoor lighting. Clear any walking paths of anything that might make someone trip, such as rocks or tools. Regularly check to see if handrails are loose or broken. Make sure that both sides of any steps have handrails. Any raised decks and porches should have guardrails on the edges. Have any leaves, snow, or ice cleared regularly. Use sand or salt on walking paths during winter. Clean up any spills in your garage right away. This includes oil or grease spills. What can I do in the bathroom? Use night lights. Install grab bars by the toilet and in the tub and shower. Do not use towel bars as grab  bars. Use non-skid mats or decals in the tub or shower. If you need to sit down in the shower, use a plastic, non-slip stool. Keep the floor dry. Clean up any water that spills on the floor as soon as it happens. Remove soap buildup in the tub or shower regularly. Attach bath mats securely with double-sided non-slip rug tape. Do not have throw rugs and other things on the floor that can make you trip. What can I do in the bedroom? Use night lights. Make sure that you have a light by your bed that is easy to reach. Do not use any sheets or blankets that are too big for your bed. They should not hang down onto the floor. Have a firm chair that has side arms. You can use this for support while you get dressed. Do not have throw rugs and other things on the floor that can make you trip. What can I do in the kitchen? Clean up any spills right away. Avoid walking on wet floors. Keep items that you use a lot in easy-to-reach places. If you need to reach something above you, use a strong step stool that has a grab bar. Keep electrical cords out of the way. Do not use floor polish or wax that makes floors slippery. If you must use wax, use non-skid floor wax. Do not have throw rugs and other things on the floor that can make you trip.  What can I do with my stairs? Do not leave any items on the stairs. Make sure that there are handrails on both sides of the stairs and use them. Fix handrails that are broken or loose. Make sure that handrails are as long as the stairways. Check any carpeting to make sure that it is firmly attached to the stairs. Fix any carpet that is loose or worn. Avoid having throw rugs at the top or bottom of the stairs. If you do have throw rugs, attach them to the floor with carpet tape. Make sure that you have a light switch at the top of the stairs and the bottom of the stairs. If you do not have them, ask someone to add them for you. What else can I do to help prevent  falls? Wear shoes that: Do not have high heels. Have rubber bottoms. Are comfortable and fit you well. Are closed at the toe. Do not wear sandals. If you use a stepladder: Make sure that it is fully opened. Do not climb a closed stepladder. Make sure that both sides of the stepladder are locked into place. Ask someone to hold it for you, if possible. Clearly mark and make sure that you can see: Any grab bars or handrails. First and last steps. Where the edge of each step is. Use tools that help you move around (mobility aids) if they are needed. These include: Canes. Walkers. Scooters. Crutches. Turn on the lights when you go into a dark area. Replace any light bulbs as soon as they burn out. Set up your furniture so you have a clear path. Avoid moving your furniture around. If any of your floors are uneven, fix them. If there are any pets around you, be aware of where they are. Review your medicines with your doctor. Some medicines can make you feel dizzy. This can increase your chance of falling. Ask your doctor what other things that you can do to help prevent falls. This information is not intended to replace advice given to you by your health care provider. Make sure you discuss any questions you have with your health care provider. Document Released: 12/21/2008 Document Revised: 08/02/2015 Document Reviewed: 03/31/2014 Elsevier Interactive Patient Education  2017 [employer].

## 2021-11-22 NOTE — Progress Notes (Signed)
Subjective:   Ashley Peters is a 88 y.o. female who presents for Medicare Annual (Subsequent) preventive examination.  Virtual Visit via Telephone Note  I connected with  Ashley Peters on 11/22/21 at  9:00 AM EDT by telephone and verified that I am speaking with the correct person using two identifiers.  Location: Patient: Home Provider: [facility] Persons participating in the virtual visit: patient/Nurse Health Advisor   I discussed the limitations, risks, security and privacy concerns of performing an evaluation and management service by telephone and the availability of in person appointments. The patient expressed understanding and agreed to proceed.  Interactive audio and video telecommunications were attempted between this nurse and patient, however failed, due to patient having technical difficulties OR patient did not have access to video capability.  We continued and completed visit with audio only.  Some vital signs may be absent or patient reported.   Ashley Peters E Ashley Bozard, LPN   Review of Systems     Cardiac Risk Factors include: advanced age (>31mn, >>57women);dyslipidemia;hypertension;sedentary lifestyle;Other (see comment)     Objective:    Today's Vitals   11/22/21 0910  Weight: 89 lb (40.4 kg)  PainSc: 8    Body mass index is 17.38 kg/m.     11/22/2021    9:14 AM 03/19/2021   10:49 AM 02/25/2021    1:15 PM 11/21/2020    9:19 AM 09/21/2020   12:29 PM 09/20/2020    9:00 AM 09/19/2020    6:43 PM  Advanced Directives  Does Patient Have a Medical Advance Directive? No Yes No No Yes Yes Yes  Type of Advance Directive  Living will   Healthcare [place] of [place]  Does patient want to make changes to medical advance directive?  No - Patient declined    No - Patient declined   Would patient like information on creating a medical advance directive? No - Patient declined No - Patient declined No - Patient declined No - Patient declined        Current Medications (verified) Outpatient Encounter Medications as of 11/22/2021  Medication Sig   acetaminophen (TYLENOL) 500 MG tablet Take 2 tablets (1,000 mg total) by mouth every 6 (six) hours as needed for moderate pain. (Patient taking differently: Take 500-1,000 mg by mouth every 6 (six) hours as needed for moderate pain.)   acetaminophen-codeine (TYLENOL #3) 300-30 MG tablet Take 1 tablet by mouth every 4 (four) hours as needed for moderate pain.   alendronate (FOSAMAX) 70 MG tablet Take with a full glass of water on an empty stomach.   Cyanocobalamin 1000 MCG/ML KIT Inject 1,000 mcg as directed every 30 (thirty) days.   megestrol (MEGACE) 40 MG/ML suspension TAKE 10 MLS (400 MG TOTAL) BY MOUTH DAILY.   Multiple Vitamins-Minerals (PRESERVISION AREDS 2 PO) Take 1 capsule by mouth 2 (two) times daily.   ondansetron (ZOFRAN) 8 MG tablet Take 1 tablet (8 mg total) by mouth every 8 (eight) hours as needed for nausea or vomiting.   prochlorperazine (COMPAZINE) 5 MG tablet Take 1-2 tablets (5-10 mg total) by mouth every 6 (six) hours as needed for nausea or vomiting.   Sodium Chloride Flush (NORMAL SALINE FLUSH) 0.9 % SOLN Flush drain once a daily with 5 MLs. Then discard the remainder   sodium chloride flush (NS) 0.9 % SOLN Flush abdominal drain with 5 mL once daily.   Facility-Administered Encounter Medications as of 11/22/2021  Medication   cyanocobalamin ((VITAMIN B-12)) injection  1,000 mcg    Allergies (verified) Benicar [olmesartan medoxomil] and Amoxicillin   History: Past Medical History:  Diagnosis Date   Anemia    Blood transfusion without reported diagnosis    Cancer ([place])    Colon cancer ([place]) dx'd 10/2016   Cystoid macular edema of left eye 07/05/2019   Diverticulosis    Family history of breast cancer    Family history of colon cancer    Family history of uterine cancer    GERD (gastroesophageal reflux disease)    History of kidney stones    Hyperlipidemia     Hypertension    Macular degeneration    Osteopenia    Past Surgical History:  Procedure Laterality Date   ABDOMINAL HYSTERECTOMY     ablation tr Great saphenous vein  10/17/2009   BILIARY BRUSHING  02/26/2021   Procedure: BILIARY BRUSHING;  Surgeon: Irving Copas., MD;  Location: [facility];  Service: Gastroenterology;;   BIOPSY  02/26/2021   Procedure: BIOPSY;  Surgeon: Irving Copas., MD;  Location: [facility];  Service: Gastroenterology;;   BREAST EXCISIONAL BIOPSY Bilateral 1971   brest biopsy  [place] N/A 10/14/2016   Procedure: REPAIR OF EPIGASTRIC VENTRAL HERNIA;  Surgeon: Donnie Mesa, MD;  Location: [facility];  Service: General;  Laterality: N/A;   ERCP N/A 09/21/2020   Procedure: ENDOSCOPIC RETROGRADE CHOLANGIOPANCREATOGRAPHY (ERCP);  Surgeon: Gatha Mayer, MD;  Location: [facility];  Service: Endoscopy;  Laterality: N/A;   ERCP N/A 02/26/2021   Procedure: ENDOSCOPIC RETROGRADE CHOLANGIOPANCREATOGRAPHY (ERCP);  Surgeon: Irving Copas., MD;  Location: [facility];  Service: Gastroenterology;  Laterality: N/A;   IR EXCHANGE BILIARY DRAIN  05/31/2021   IR EXCHANGE BILIARY DRAIN  07/26/2021   IR EXCHANGE BILIARY DRAIN  09/20/2021   IR EXCHANGE BILIARY DRAIN  11/15/2021   IR PERCUTANEOUS TRANSHEPATIC CHOLANGIOGRAM  02/27/2021   lt. distal ureteral stone extraction  09/14/1991   PARTIAL COLECTOMY Right 10/14/2016   Procedure: OPEN RIGHT HEMICOLECTOMY;  Surgeon: Donnie Mesa, MD;  Location: [facility];  Service: General;  Laterality: Right;   REMOVAL OF STONES  09/21/2020   Procedure: REMOVAL OF STONES;  Surgeon: Gatha Mayer, MD;  Location: [facility];  Service: Endoscopy;;   REMOVAL OF STONES  02/26/2021   Procedure: REMOVAL OF SLUDGE;  Surgeon: Irving Copas., MD;  Location: [facility];  Service: Gastroenterology;;   [name] Mayans  09/21/2020   Procedure: [name] Mayans;  Surgeon: Gatha Mayer,  MD;  Location: [facility];  Service: Endoscopy;;   SPHINCTEROTOMY  02/26/2021   Procedure: [name] Mayans;  Surgeon: Mansouraty, Telford Nab., MD;  Location: [facility];  Service: Gastroenterology;;   [facility] CHOLANGIOSCOPY N/A 02/26/2021   Procedure: UPJSRPRX CHOLANGIOSCOPY;  Surgeon: Irving Copas., MD;  Location: [facility];  Service: Gastroenterology;  Laterality: N/A;   STONE EXTRACTION WITH BASKET  09/21/2020   Procedure: STONE EXTRACTION WITH BASKET;  Surgeon: Gatha Mayer, MD;  Location: [facility];  Service: Endoscopy;;   Family History  Problem Relation Age of Onset   Stroke Father 79   Breast cancer Sister        dx 5's, died at 36   Seizures Son    Diabetes Brother    Kidney disease Brother    Heart disease Brother    Epilepsy Son    Colon cancer Other 75   Uterine cancer Other 60   Hodgkin's lymphoma Other    Breast cancer Other  Social History   Socioeconomic History   Marital status: Widowed    Spouse name: Not on file   Number of children: 3   Years of education: 80   Highest education level: Some college, no degree  Occupational History   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  Tobacco Use   Smoking status: Never   Smokeless tobacco: Never  Vaping Use   Vaping Use: Never used  Substance and Sexual Activity   Alcohol use: No   Drug use: No   Sexual activity: Not Currently    Birth control/protection: None  Other Topics Concern   Not on file  Social History Narrative   Lives alone - daughter and son live 2 miles away   She stays with her daughter a lot   Social Determinants of Health   Financial Resource Strain: [place]  (11/22/2021)   Overall Financial Resource Strain (CARDIA)    Difficulty of Paying Living Expenses: Not hard at all  Food Insecurity: No Food Insecurity (11/22/2021)   Hunger Vital Sign    Worried About Running Out of Food in the Last Year: Never true    [place] in the Last Year: Never true   Transportation Needs: No Transportation Needs (11/22/2021)   PRAPARE - Hydrologist (Medical): No    Lack of Transportation (Non-Medical): No  Physical Activity: Insufficiently Active (11/22/2021)   Exercise Vital Sign    Days of Exercise per Week: 7 days    Minutes of Exercise per Session: 10 min  Stress: Stress Concern Present (11/22/2021)   [place]    Feeling of Stress : To some extent  Social Connections: Socially Isolated (11/22/2021)   Social Connection and Isolation Panel [NHANES]    Frequency of Communication with Friends and Family: More than three times a week    Frequency of Social Gatherings with Friends and Family: More than three times a week    Attends Religious Services: Never    Marine scientist or Organizations: No    Attends Archivist Meetings: Never    Marital Status: Widowed    Tobacco Counseling Counseling given: Not Answered   Clinical Intake:  Pre-visit preparation completed: Yes  Pain : 0-10 Pain Score: 8  Pain Type: Chronic pain Pain Location: Back Pain Orientation: Lower Pain Descriptors / Indicators: Aching, Sharp Pain Onset: More than a month ago Pain Frequency: Constant     BMI - recorded: 17.38 Nutritional Status: BMI <19  Underweight Nutritional Risks: Nausea/ vomitting/ diarrhea, Unintentional weight loss Diabetes: No  How often do you need to have someone help you when you read instructions, pamphlets, or other written materials from your doctor or pharmacy?: 1 - Never  Diabetic? no  Interpreter Needed?: No  Information entered by :: Milani Lowenstein, LPN   Activities of Daily Living    11/22/2021    9:11 AM 02/25/2021   10:00 PM  In your present state of health, do you have any difficulty performing the following activities:  Hearing? 0 0  Vision? 0 0  Difficulty concentrating or making decisions? 0 0  Walking or  climbing stairs? 0 0  Dressing or bathing? 0 0  Doing errands, shopping? 1 0  Preparing Food and eating ? N   Using the Toilet? N   In the past six months, have you accidently leaked urine? N   Do you have problems with loss  of bowel control? N   Managing your Medications? N   Managing your Finances? N   Housekeeping or managing your Housekeeping? Y     Patient Care Team: Dettinger, [name], MD as PCP - General (Family Medicine) Zadie Rhine Clent Demark, MD as Consulting Physician (Ophthalmology) Donnie Mesa, MD as Consulting Physician (General Surgery) Truitt Merle, MD as Consulting Physician (Hematology)  Indicate any recent Medical Services you may have received from other than [facility] providers in the past year (date may be approximate).     Assessment:   This is a routine wellness examination for [place].  Hearing/Vision screen Hearing Screening - Comments:: C/o mild hearing difficulties  - no hearing aids Vision Screening - Comments:: Wears reading glasses - up to date with routine eye exams with [facility]  Dietary issues and exercise activities discussed: Current Exercise Habits: The patient does not participate in regular exercise at present, Exercise limited by: orthopedic condition(s)   Goals Addressed             This Visit's Progress    Patient Stated       Work on eating better, hope appetite improves, drink 6 glasses of water daily and try to stay as active and independent as possible        Depression Screen    11/22/2021    9:15 AM 05/06/2021   11:05 AM 11/21/2020    9:17 AM 10/17/2020    2:07 PM 10/05/2020    9:58 AM 09/12/2020   11:22 AM 08/23/2020    8:05 AM  PHQ 2/9 Scores  PHQ - 2 Score 0 0 0 0 0 0 0  PHQ- 9 Score   0  0      Fall Risk    11/22/2021    9:12 AM 05/06/2021   11:05 AM 11/21/2020    9:19 AM 10/17/2020    2:07 PM 10/05/2020    9:58 AM  Fall Risk   Falls in the past year? 0 0 0 0 0  Number falls in past yr: 0  0    Injury with Fall? 0  0    Risk  for fall due to : Impaired balance/gait;Orthopedic patient  Impaired vision;Orthopedic patient    Follow up Falls prevention discussed  Falls prevention discussed      FALL RISK PREVENTION PERTAINING TO THE HOME:  Any stairs in or around the home? No  If so, are there any without handrails? No  Home free of loose throw rugs in walkways, pet beds, electrical cords, etc? Yes  Adequate lighting in your home to reduce risk of falls? Yes   ASSISTIVE DEVICES UTILIZED TO PREVENT FALLS:  Life alert? No  Use of a cane, walker or w/c? Yes  Grab bars in the bathroom? Yes  Shower chair or bench in shower? Yes  Elevated toilet seat or a handicapped toilet? Yes   TIMED UP AND GO:  Was the test performed? No . Telephonic visit  Cognitive Function:    08/06/2017    9:30 AM 06/23/2016   10:50 AM 12/04/2014    8:55 AM  MMSE - Mini Mental State Exam  Orientation to time _0 Orientation to Place _1 Registration _2 Attention/ Calculation _3 Recall _4 Language- name 2 objects _5 Language- repeat _6 Language- follow 3 step command _7 Language- read & follow direction 1 1 1  Write a sentence _0 Copy design _1 Total score _2 11/22/2021    9:19 AM 11/21/2020    9:28 AM 11/21/2019    9:44 AM 11/17/2018    8:46 AM  6CIT Screen  What Year? 0 points 0 points 0 points 0 points  What month? 0 points 0 points 0 points 0 points  What time? 0 points 0 points 0 points 0 points  Count back from 20 0 points 0 points 0 points 0 points  Months in reverse 2 points 2 points 2 points 4 points  Repeat phrase 2 points 0 points 0 points 0 points  Total Score 4 points 2 points 2 points 4 points    Immunizations Immunization History  Administered Date(s) Administered   DTaP 11/09/2003   Fluad Quad(high Dose 65+) 12/07/2018, 12/27/2019, 12/14/2020   Influenza Whole 12/10/2009   Influenza, High Dose Seasonal PF 12/15/2016, 12/07/2017   Influenza,inj,Quad  PF,6+ Mos 12/15/2012, 12/22/2013, 12/13/2014, 12/25/2015   PFIZER(Purple Top)SARS-COV-2 Vaccination 04/16/2019, 05/10/2019, 12/13/2019   Pneumococcal Conjugate-13 06/23/2016   Pneumococcal Polysaccharide-23 03/16/2007   Tdap 08/06/2017   Zoster, Live 12/17/2011    TDAP status: Up to date  Flu Vaccine status: Up to date  Pneumococcal vaccine status: Up to date  Covid-19 vaccine status: Completed vaccines  Qualifies for Shingles Vaccine? Yes   Zostavax completed Yes   Shingrix Completed?: No.    Education has been provided regarding the importance of this vaccine. Patient has been advised to call insurance company to determine out of pocket expense if they have not yet received this vaccine. Advised may also receive vaccine at local pharmacy or Health Dept. Verbalized acceptance and understanding.  Screening Tests Health Maintenance  Topic Date Due   Zoster Vaccines- Shingrix (1 of 2) Never done   COVID-19 Vaccine (4 - Pfizer risk series) 02/07/2020   INFLUENZA VACCINE  10/08/2021   DEXA SCAN  02/23/2022   TETANUS/TDAP  08/07/2027   Pneumonia Vaccine 56+ Years old  Completed   HPV VACCINES  Aged Out    Health Maintenance  Health Maintenance Due  Topic Date Due   Zoster Vaccines- Shingrix (1 of 2) Never done   COVID-19 Vaccine (4 - Pfizer risk series) 02/07/2020   INFLUENZA VACCINE  10/08/2021    Colorectal cancer screening: No longer required.   Mammogram status: Completed 03/01/2021. Repeat every year  Bone Density status: Completed 02/24/2020. Results reflect: Bone density results: OSTEOPOROSIS. Repeat every 2 years.  Lung Cancer Screening: (Low Dose CT Chest recommended if Age 41-80 years, 30 pack-year currently smoking OR have quit w/in 15years.) does not qualify.   Additional Screening:  Hepatitis C Screening: does not qualify  Vision Screening: Recommended annual ophthalmology exams for early detection of glaucoma and other disorders of the eye. Is the  patient up to date with their annual eye exam?  Yes  Who is the provider or what is the name of the office in which the patient attends annual eye exams? [facility] If pt is not established with a provider, would they like to be referred to a provider to establish care? No .   Dental Screening: Recommended annual dental exams for proper oral hygiene  Community Resource Referral / Chronic Care Management: CRR required this visit?  No   CCM required this visit?  No      Plan:     I have personally reviewed and noted the following in  the patient's chart:   Medical and social history Use of alcohol, tobacco or illicit drugs  Current medications and supplements including opioid prescriptions. Patient is not currently taking opioid prescriptions. Functional ability and status Nutritional status Physical activity Advanced directives List of other physicians Hospitalizations, surgeries, and ER visits in previous 12 months Vitals Screenings to include cognitive, depression, and falls Referrals and appointments  In addition, I have reviewed and discussed with patient certain preventive protocols, quality metrics, and best practice recommendations. A written personalized care plan for preventive services as well as general preventive health recommendations were provided to patient.     Sandrea Hammond, LPN   11/29/1937   Nurse Notes: no appetite, not drinking much - she is trying to make herself. Cannot tolerate Ensure - has tried several shakes and otc supplements.

## 2021-11-26 ENCOUNTER — Other Ambulatory Visit: Payer: Medicare Other | Admitting: Family Medicine

## 2021-11-26 VITALS — BP 112/80 | HR 105 | Resp 18

## 2021-11-26 DIAGNOSIS — R634 Abnormal weight loss: Secondary | ICD-10-CM | POA: Diagnosis not present

## 2021-11-26 DIAGNOSIS — R110 Nausea: Secondary | ICD-10-CM

## 2021-11-26 DIAGNOSIS — R64 Cachexia: Secondary | ICD-10-CM

## 2021-11-26 DIAGNOSIS — C240 Malignant neoplasm of extrahepatic bile duct: Secondary | ICD-10-CM

## 2021-11-26 NOTE — Progress Notes (Signed)
[facility] Palliative Care Consult Note Telephone: [phone]  Fax: [phone]    Date of encounter: 11/26/21 9:08 AM PATIENT NAME: Ashley Peters [place] [id number]   [phone] (home)  DOB: 01/10/1929 MRN: [id number] PRIMARY CARE PROVIDER:    Dettinger, [name], MD,  [place] [address] [phone]  REFERRING PROVIDER:   Dettinger, [name], MD [address] [place],  [place] [address] [phone]  RESPONSIBLE PARTY:    Contact Information     Name Relation Home Work [place] Daughter [phone]  [phone]   Lisaann, Atha [phone]  [phone]        I met face to face with patient and her daughter Donah Driver in her home. Palliative Care was asked to follow this patient by consultation request of  Dettinger, [name], MD to address advance care planning and complex medical decision making. 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 Has T tube changed q 8 weeks by interventional radiology Has nausea and dry heaves Question if back pain relative to biliary cancer. Trial use of Lidoderm patch for 12 hour period of the day where she has the worst pain to see if this helps Has Tylenol #3 for pain but has nausea.  Encouraged use of antiemetic first prior to taking pain meds if needed.   Advised not to exceed 2 gm Tylenol/24 hours given liver involvement  2.  Nausea, cachexia, weight loss  30 lb weight loss since Jan 2023 Encouraged small, frequent intake every 2-3 hours to increase protein intake and maintain weight. Discussed DNR, MOST and goals of care.  Completed DNR at pt request.  Discussed Hospice services with pt and her daughter.  They will consider and talk with family, let provider know if they are interested.     Advance Care Planning/Goals of Care:  Goals include to maximize quality of life and symptom management. Patient/health care surrogate gave their permission to discuss. Our advance care planning conversation included a discussion about:    The value and importance of advance care planning  Experiences with loved ones who have been seriously ill or have died  Exploration of personal, cultural or spiritual beliefs that might influence medical decisions  Exploration of goals of care in the event of a sudden injury or illness  Identification of a healthcare agent-daughter, Technical sales engineer of an advance directive document-DNR. Decision not to resuscitate or to de-escalate disease focused treatments due to poor prognosis. CODE STATUS: DNR      Follow up Palliative Care Visit: Palliative care will continue to follow for complex medical decision making, advance care planning, and clarification of goals. Return 4 weeks or prn.   This visit was coded based on medical decision making (MDM).  PPS: 60%  HOSPICE ELIGIBILITY/DIAGNOSIS: TBD  Chief Complaint:  Palliative Care is following for chronic medical management in setting of hilar cholangiocarcinoma.  HISTORY OF PRESENT ILLNESS:  Ashley Peters is a 88 y.o. year old female with hilar cholangiocarcinoma, cancer of right colon s/p partial colectomy, GERD, HTN , osteoporosis, iron deficiency and B12 deficiency anemia.  Having a little bit of dry heaves.  Has nausea most every day and more at night. Has nausea then pain worsens in back.  Has chronic pain. Not currently doing any treatment. Ttube present, not draining.  Has done radiation  in February.  Feels weaker and fatigued. Weighed 83 lbs yesterday on daughter's home scale.  Doesn't like the foods she used to like. No falls.  Back pain rates an 8/10 scale. She has taken Tylenol.  No trouble swallowing. Coughing at night and has some trouble with larger pills like Tylenol. Has not used a stool softener.  Did a week of appetite  stimulant. Not drinking much, likes Ginger Ale and chocolate milk. Change Ttube q 8 weeks.  History obtained from review of EMR, discussion with primary team, and interview with family, facility staff/caregiver and/or Ms. Marshell Levan.   Latest Reference Range & Units 10/25/21 08:09  WBC 4.0 - 10.5 K/uL 7.9  RBC 3.87 - 5.11 MIL/uL 5.09  Hemoglobin 12.0 - 15.0 g/dL 14.8  HCT 36.0 - 46.0 % 43.8  MCV 80.0 - 100.0 fL 86.1  MCH 26.0 - 34.0 pg 29.1  MCHC 30.0 - 36.0 g/dL 33.8  RDW 11.5 - 15.5 % 14.3  Platelets 150 - 400 K/uL 178  nRBC 0.0 - 0.2 % 0.0  Neutrophils % 88  Lymphocytes % 4  Monocytes Relative % 5  Eosinophil % 1  Basophil % 1  Immature Granulocytes % 1  NEUT# 1.7 - 7.7 K/uL 7.0  Lymphocyte # 0.7 - 4.0 K/uL 0.4 (L)  Monocyte # 0.1 - 1.0 K/uL 0.4  Eosinophils Absolute 0.0 - 0.5 K/uL 0.0  Basophils Absolute 0.0 - 0.1 K/uL 0.0  Abs Immature Granulocytes 0.00 - 0.07 K/uL 0.05  (L): Data is abnormally low     Ref Range & Units 3 mo ago (08/26/21) 4 mo ago (07/12/21) 6 mo ago (05/31/21) 7 mo ago (04/04/21) 8 mo ago (03/07/21) 9 mo ago (02/28/21) 9 mo ago (02/27/21) 9 mo ago (02/27/21)  Sodium 135 - 145 mmol/L 141  140  138  140  139 R  137   135   Potassium 3.5 - 5.1 mmol/L 3.6  4.5  4.6  4.2  4.1 R  4.0   4.1   Chloride 98 - 111 mmol/L 106  106  103  107  103 R  108   105   CO2 22 - 32 mmol/L '28  29  30  28  29 ' R  21 Low    22   Glucose, Bld 70 - 99 mg/dL 87  105 High  CM  91 CM  104 High  CM  92  110 High  CM   190 High  CM   Comment: Glucose reference range applies only to samples taken after fasting for at least 8 hours.  BUN 8 - 23 mg/dL '22  17  20  20  14 ' R  26 High    29 High    Creatinine, Ser 0.44 - 1.00 mg/dL 0.86  0.81  0.96  0.78  0.71 R  0.86   0.84   Calcium 8.9 - 10.3 mg/dL 10.0  9.8  10.1  9.9  9.9 R  8.7 Low    8.6 Low    Total Protein 6.5 - 8.1 g/dL 6.9  7.1  6.7  7.2  6.8 R  5.6 Low   5.5 Low     Albumin 3.5 - 5.0 g/dL 3.8  4.0  3.9  4.2  3.7 R  2.6 Low   2.6  Low     AST 15 - 41 U/L '20  22  22  20  ' 70 High  R  57 High   76 High  ALT 0 - 44 U/L '10  12  18  14  ' 78 High  R  68 High   83 High     Alkaline Phosphatase 38 - 126 U/L 91  68  71  70  238 High  R  237 High   267 High     Total Bilirubin 0.3 - 1.2 mg/dL 0.6  0.9  0.9  1.7 High   5.5 High  R  10.8 High  CM  18.1 High Panic  CM    GFR, Estimated >60 mL/min >60  >60 CM  56 Low         I reviewed EMR for available labs, medications, imaging, studies and related documents.  There are no new records since last visit/Records reviewed and summarized above.   ROS General: NAD EYES: has some worsening vision  ENMT: denies dysphagia Cardiovascular: denies chest pain, denies DOE Pulmonary: denies cough, denies increased SOB Abdomen: endorses poor appetite and nausea, denies constipation, endorses continence of bowel GU: denies dysuria, endorses continence of urine MSK:  denies increased weakness, no falls reported Skin: denies rashes or wounds Neurological: denies pain, denies insomnia Psych: Endorses flat affect and lack of interest in normal activities Heme/lymph/immuno: denies bruises, abnormal bleeding  Physical Exam: Current and past weights: 83 lbs as of 11/25/21, On 03/19/21 weight was 113 lbs 9.6 ounces Constitutional: NAD General: frail appearing, thin, appears fatigued CV: S1S2, RRR, no LE edema Pulmonary: CTAB, no increased work of breathing, no cough, room air Abdomen: normo-active BS + 4 quadrants, soft and non tender, no ascites MSK: no sarcopenia, moves all extremities, ambulatory Skin: warm and dry, no rashes or wounds on visible skin Neuro:  no generalized weakness,  no cognitive impairment Psych: non-anxious affect, A and O x 3 Hem/lymph/immuno: no widespread bruising   Thank you for the opportunity to participate in the care of Ms. Marshell Levan.  The palliative care team will continue to follow. Please call our office at [phone] if we can be of additional assistance.    Marijo Conception, FNP -C  COVID-19 PATIENT SCREENING TOOL Asked and negative response unless otherwise noted:   Have you had symptoms of covid, tested positive or been in contact with someone with symptoms/positive test in the past 5-10 days?  No

## 2021-11-28 ENCOUNTER — Ambulatory Visit (INDEPENDENT_AMBULATORY_CARE_PROVIDER_SITE_OTHER): Payer: Medicare Other | Admitting: Family Medicine

## 2021-11-28 ENCOUNTER — Encounter: Payer: Self-pay | Admitting: Family Medicine

## 2021-11-28 VITALS — BP 122/80 | HR 100 | Temp 98.2°F | Ht 60.0 in | Wt 82.0 lb

## 2021-11-28 DIAGNOSIS — Z23 Encounter for immunization: Secondary | ICD-10-CM | POA: Diagnosis not present

## 2021-11-28 DIAGNOSIS — I10 Essential (primary) hypertension: Secondary | ICD-10-CM | POA: Diagnosis not present

## 2021-11-28 DIAGNOSIS — K219 Gastro-esophageal reflux disease without esophagitis: Secondary | ICD-10-CM

## 2021-11-28 DIAGNOSIS — E782 Mixed hyperlipidemia: Secondary | ICD-10-CM

## 2021-11-28 NOTE — Progress Notes (Signed)
BP 122/80   Pulse 100   Temp 98.2 F (36.8 C)   Ht 5' (1.524 m)   Wt 82 lb (37.2 kg)   SpO2 100%   BMI 16.01 kg/m    Subjective:   Patient ID: Ashley Peters, female    DOB: [birth year], 88 y.o.   MRN: [id number]  HPI: Ashley Peters is a 88 y.o. female presenting on 11/28/2021 for Medical Management of Chronic Issues, Hyperlipidemia, and Hypertension   HPI Hypertension Patient is currently on medication currently, and their blood pressure today is 122/80 . Patient denies any lightheadedness or dizziness. Patient denies headaches, blurred vision, chest pains, shortness of breath, or weakness. Denies any side effects from medication and is content with current medication.   Hyperlipidemia Patient is coming in for recheck of his hyperlipidemia. The patient is currently taking no medicine currently. They deny any issues with myalgias or history of liver damage from it. They deny any focal numbness or weakness or chest pain.  Weight loss and anorexia Patient has been having consistent weight loss and anorexia.  They are trying different techniques to see what can help with her.  She is on Compazine and ondansetron as needed.  They have contacted palliative care and are involved with them just recently.   GERD Patient is currently on Compazine and Zofran.  She denies any major symptoms or abdominal pain or belching or burping. She denies any blood in her stool or lightheadedness or dizziness.   Relevant past medical, surgical, family and social history reviewed and updated as indicated. Interim medical history since our last visit reviewed. Allergies and medications reviewed and updated.  Review of Systems  Constitutional:  Positive for appetite change and unexpected weight change. Negative for chills and fever.  HENT:  Negative for congestion, ear discharge and ear pain.   Eyes:  Negative for redness and visual disturbance.  Respiratory:  Negative for chest tightness and shortness  of breath.   Cardiovascular:  Negative for chest pain and leg swelling.  Genitourinary:  Negative for difficulty urinating and dysuria.  Musculoskeletal:  Negative for back pain and gait problem.  Skin:  Negative for rash.  Neurological:  Negative for light-headedness and headaches.  Psychiatric/Behavioral:  Negative for agitation and behavioral problems.   All other systems reviewed and are negative.   Per HPI unless specifically indicated above   Allergies as of 11/28/2021       Reactions   Benicar [olmesartan Medoxomil] Other (See Comments)   Elevated Potassium   Amoxicillin Rash   Has patient had a PCN reaction causing immediate rash, facial/tongue/throat swelling, SOB or lightheadedness with hypotension: Yes Has patient had a PCN reaction causing severe rash involving mucus membranes or skin necrosis: No Has patient had a PCN reaction that required hospitalization: No Has patient had a PCN reaction occurring within the last 10 years: No If all of the above answers are "NO", then may proceed with Cephalosporin use.        Medication List        Accurate as of November 28, 2021  3:01 PM. If you have any questions, ask your nurse or doctor.          STOP taking these medications    acetaminophen-codeine 300-30 MG tablet Commonly known as: TYLENOL #3 Stopped by: Worthy Rancher, MD   megestrol 40 MG/ML suspension Commonly known as: MEGACE Stopped by: Worthy Rancher, MD       TAKE these medications  acetaminophen 500 MG tablet Commonly known as: TYLENOL Take 2 tablets (1,000 mg total) by mouth every 6 (six) hours as needed for moderate pain. What changed: how much to take   alendronate 70 MG tablet Commonly known as: FOSAMAX Take with a full glass of water on an empty stomach.   Cyanocobalamin 1000 MCG/ML Kit Inject 1,000 mcg as directed every 30 (thirty) days.   Normal Saline Flush 0.9 % Soln Flush abdominal drain with 5 mL once daily. What  changed: Another medication with the same name was removed. Continue taking this medication, and follow the directions you see here. Changed by: [name] Naria Abbey, MD   ondansetron 8 MG tablet Commonly known as: ZOFRAN Take 1 tablet (8 mg total) by mouth every 8 (eight) hours as needed for nausea or vomiting.   PRESERVISION AREDS 2 PO Take 1 capsule by mouth 2 (two) times daily.   prochlorperazine 5 MG tablet Commonly known as: COMPAZINE Take 1-2 tablets (5-10 mg total) by mouth every 6 (six) hours as needed for nausea or vomiting.         Objective:   BP 122/80   Pulse 100   Temp 98.2 F (36.8 C)   Ht 5' (1.524 m)   Wt 82 lb (37.2 kg)   SpO2 100%   BMI 16.01 kg/m   Wt Readings from Last 3 Encounters:  11/28/21 82 lb (37.2 kg)  11/22/21 89 lb (40.4 kg)  10/25/21 89 lb (40.4 kg)    Physical Exam Vitals and nursing note reviewed.  Constitutional:      General: She is not in acute distress.    Appearance: She is underweight. She is not ill-appearing, toxic-appearing or diaphoretic.  Eyes:     Conjunctiva/sclera: Conjunctivae normal.  Cardiovascular:     Rate and Rhythm: Normal rate and regular rhythm.     Heart sounds: Normal heart sounds. No murmur heard. Pulmonary:     Effort: Pulmonary effort is normal. No respiratory distress.     Breath sounds: Normal breath sounds. No wheezing.  Musculoskeletal:        General: No tenderness. Normal range of motion.  Skin:    General: Skin is warm and dry.     Findings: No rash.  Neurological:     Mental Status: She is alert and oriented to person, place, and time.     Coordination: Coordination normal.  Psychiatric:        Behavior: Behavior normal.       Assessment & Plan:   Problem List Items Addressed This Visit       Cardiovascular and Mediastinum   HTN (hypertension) - Primary   Relevant Orders   CBC with Differential/Platelet   CMP14+EGFR   Lipid panel   TSH     Digestive   GERD (gastroesophageal  reflux disease)   Relevant Orders   CBC with Differential/Platelet   CMP14+EGFR   Lipid panel   TSH     Other   Hyperlipidemia   Relevant Orders   CBC with Differential/Platelet   CMP14+EGFR   Lipid panel   TSH   Other Visit Diagnoses     Need for immunization against influenza       Relevant Orders   Flu Vaccine QUAD High Dose(Fluad) (Completed)       Continue current medicine for now, recommended that they try doing the Compazine once a day every morning to start off to see if that gets her appetite increased and good weight.  She  says she was not able to tolerate the Megace and had a lot of abdominal cramping and diarrhea. Follow up plan: Return in about 6 months (around 05/29/2022), or if symptoms worsen or fail to improve, for Hypertension and hyperlipidemia.  Counseling provided for all of the vaccine components Orders Placed This Encounter  Procedures   Flu Vaccine QUAD High Dose(Fluad)   CBC with Differential/Platelet   CMP14+EGFR   Lipid panel   TSH    Caryl Pina, MD [facility] 11/28/2021, 3:01 PM

## 2021-11-29 ENCOUNTER — Encounter: Payer: Self-pay | Admitting: Family Medicine

## 2021-11-30 LAB — CBC WITH DIFFERENTIAL/PLATELET
Basophils Absolute: 0.1 10*3/uL (ref 0.0–0.2)
Basos: 1 %
EOS (ABSOLUTE): 0.1 10*3/uL (ref 0.0–0.4)
Eos: 1 %
Hematocrit: 41 % (ref 34.0–46.6)
Hemoglobin: 13.9 g/dL (ref 11.1–15.9)
Immature Grans (Abs): 0.1 10*3/uL (ref 0.0–0.1)
Immature Granulocytes: 1 %
Lymphocytes Absolute: 0.5 10*3/uL — ABNORMAL LOW (ref 0.7–3.1)
Lymphs: 6 %
MCH: 28.8 pg (ref 26.6–33.0)
MCHC: 33.9 g/dL (ref 31.5–35.7)
MCV: 85 fL (ref 79–97)
Monocytes Absolute: 0.6 10*3/uL (ref 0.1–0.9)
Monocytes: 7 %
Neutrophils Absolute: 7.5 10*3/uL — ABNORMAL HIGH (ref 1.4–7.0)
Neutrophils: 84 %
Platelets: 522 10*3/uL — ABNORMAL HIGH (ref 150–450)
RBC: 4.82 x10E6/uL (ref 3.77–5.28)
RDW: 13.6 % (ref 11.7–15.4)
WBC: 8.8 10*3/uL (ref 3.4–10.8)

## 2021-11-30 LAB — CMP14+EGFR
ALT: 16 IU/L (ref 0–32)
AST: 23 IU/L (ref 0–40)
Albumin/Globulin Ratio: 1.1 — ABNORMAL LOW (ref 1.2–2.2)
Albumin: 3.2 g/dL — ABNORMAL LOW (ref 3.6–4.6)
Alkaline Phosphatase: 385 IU/L — ABNORMAL HIGH (ref 44–121)
BUN/Creatinine Ratio: 26 (ref 12–28)
BUN: 18 mg/dL (ref 10–36)
Bilirubin Total: 0.4 mg/dL (ref 0.0–1.2)
CO2: 21 mmol/L (ref 20–29)
Calcium: 9.3 mg/dL (ref 8.7–10.3)
Chloride: 101 mmol/L (ref 96–106)
Creatinine, Ser: 0.69 mg/dL (ref 0.57–1.00)
Globulin, Total: 2.9 g/dL (ref 1.5–4.5)
Glucose: 122 mg/dL — ABNORMAL HIGH (ref 70–99)
Potassium: 5.1 mmol/L (ref 3.5–5.2)
Sodium: 136 mmol/L (ref 134–144)
Total Protein: 6.1 g/dL (ref 6.0–8.5)
eGFR: 82 mL/min/{1.73_m2} (ref >59)

## 2021-11-30 LAB — LIPID PANEL
Chol/HDL Ratio: 2.2 ratio (ref 0.0–4.4)
Cholesterol, Total: 126 mg/dL (ref 100–199)
HDL: 57 mg/dL (ref >39)
LDL Chol Calc (NIH): 48 mg/dL (ref 0–99)
Triglycerides: 118 mg/dL (ref 0–149)
VLDL Cholesterol Cal: 21 mg/dL (ref 5–40)

## 2021-11-30 LAB — TSH: TSH: 3.64 u[IU]/mL (ref 0.450–4.500)

## 2021-12-11 ENCOUNTER — Other Ambulatory Visit: Payer: Self-pay | Admitting: Hematology

## 2021-12-11 DIAGNOSIS — C240 Malignant neoplasm of extrahepatic bile duct: Secondary | ICD-10-CM

## 2021-12-19 ENCOUNTER — Ambulatory Visit (INDEPENDENT_AMBULATORY_CARE_PROVIDER_SITE_OTHER): Payer: Medicare Other | Admitting: *Deleted

## 2021-12-19 DIAGNOSIS — E538 Deficiency of other specified B group vitamins: Secondary | ICD-10-CM

## 2022-01-03 ENCOUNTER — Other Ambulatory Visit: Payer: Self-pay

## 2022-01-03 ENCOUNTER — Encounter (HOSPITAL_COMMUNITY): Payer: Self-pay

## 2022-01-03 ENCOUNTER — Emergency Department (HOSPITAL_COMMUNITY): Payer: Medicare Other

## 2022-01-03 ENCOUNTER — Emergency Department (HOSPITAL_COMMUNITY)
Admission: EM | Admit: 2022-01-03 | Discharge: 2022-01-04 | Disposition: A | Discharge status: Home / Self Care | Payer: Medicare Other | Attending: Emergency Medicine | Admitting: Emergency Medicine

## 2022-01-03 DIAGNOSIS — I499 Cardiac arrhythmia, unspecified: Secondary | ICD-10-CM | POA: Diagnosis not present

## 2022-01-03 DIAGNOSIS — I491 Atrial premature depolarization: Secondary | ICD-10-CM | POA: Diagnosis not present

## 2022-01-03 DIAGNOSIS — I4891 Unspecified atrial fibrillation: Secondary | ICD-10-CM | POA: Diagnosis not present

## 2022-01-03 DIAGNOSIS — Z8509 Personal history of malignant neoplasm of other digestive organs: Secondary | ICD-10-CM | POA: Diagnosis not present

## 2022-01-03 DIAGNOSIS — E876 Hypokalemia: Secondary | ICD-10-CM | POA: Diagnosis not present

## 2022-01-03 DIAGNOSIS — R251 Tremor, unspecified: Secondary | ICD-10-CM | POA: Diagnosis not present

## 2022-01-03 DIAGNOSIS — R069 Unspecified abnormalities of breathing: Secondary | ICD-10-CM | POA: Diagnosis not present

## 2022-01-03 DIAGNOSIS — M47814 Spondylosis without myelopathy or radiculopathy, thoracic region: Secondary | ICD-10-CM | POA: Diagnosis not present

## 2022-01-03 DIAGNOSIS — R5383 Other fatigue: Secondary | ICD-10-CM | POA: Diagnosis not present

## 2022-01-03 DIAGNOSIS — N3000 Acute cystitis without hematuria: Secondary | ICD-10-CM | POA: Diagnosis not present

## 2022-01-03 DIAGNOSIS — R509 Fever, unspecified: Secondary | ICD-10-CM | POA: Diagnosis present

## 2022-01-03 DIAGNOSIS — E860 Dehydration: Secondary | ICD-10-CM | POA: Diagnosis not present

## 2022-01-03 DIAGNOSIS — G319 Degenerative disease of nervous system, unspecified: Secondary | ICD-10-CM | POA: Diagnosis not present

## 2022-01-03 DIAGNOSIS — R42 Dizziness and giddiness: Secondary | ICD-10-CM | POA: Diagnosis not present

## 2022-01-03 DIAGNOSIS — I6782 Cerebral ischemia: Secondary | ICD-10-CM | POA: Diagnosis not present

## 2022-01-03 DIAGNOSIS — R000 Tachycardia, unspecified: Secondary | ICD-10-CM | POA: Insufficient documentation

## 2022-01-03 MED ORDER — LACTATED RINGERS IV BOLUS
1000.0000 mL | Freq: Once | INTRAVENOUS | Status: AC
  Administered 2022-01-04: 1000 mL via INTRAVENOUS

## 2022-01-03 NOTE — ED Notes (Signed)
Pt currently receiving 2nd 500cc Bolus from [facility]. EMS report Pts BP on scene was 70's/50's.

## 2022-01-03 NOTE — ED Triage Notes (Signed)
Pt arrived from home via [facility]. Pts daughter called due to Pts deteriorating condition. Pt has active DNR form on presentation. Pt lethargic but oriented and alert. Per EMS, family report Pt seeking Hospice care but [facility] and [facility] debating who should provide services for Pt. Pt reports eating this morning, last BM 2-3 days ago and ambulates with a walker at home.

## 2022-01-04 ENCOUNTER — Telehealth: Payer: Self-pay

## 2022-01-04 DIAGNOSIS — N3000 Acute cystitis without hematuria: Secondary | ICD-10-CM | POA: Diagnosis not present

## 2022-01-04 LAB — CBC WITH DIFFERENTIAL/PLATELET
Abs Immature Granulocytes: 0.76 10*3/uL — ABNORMAL HIGH (ref 0.00–0.07)
Basophils Absolute: 0 10*3/uL (ref 0.0–0.1)
Basophils Relative: 0 %
Eosinophils Absolute: 0 10*3/uL (ref 0.0–0.5)
Eosinophils Relative: 0 %
HCT: 35.8 % — ABNORMAL LOW (ref 36.0–46.0)
Hemoglobin: 11.8 g/dL — ABNORMAL LOW (ref 12.0–15.0)
Immature Granulocytes: 3 %
Lymphocytes Relative: 1 %
Lymphs Abs: 0.1 10*3/uL — ABNORMAL LOW (ref 0.7–4.0)
MCH: 27.8 pg (ref 26.0–34.0)
MCHC: 33 g/dL (ref 30.0–36.0)
MCV: 84.4 fL (ref 80.0–100.0)
Monocytes Absolute: 0.5 10*3/uL (ref 0.1–1.0)
Monocytes Relative: 2 %
Neutro Abs: 24.6 10*3/uL — ABNORMAL HIGH (ref 1.7–7.7)
Neutrophils Relative %: 94 %
Platelets: 97 10*3/uL — ABNORMAL LOW (ref 150–400)
RBC: 4.24 MIL/uL (ref 3.87–5.11)
RDW: 15.2 % (ref 11.5–15.5)
WBC: 26.1 10*3/uL — ABNORMAL HIGH (ref 4.0–10.5)
nRBC: 0 % (ref 0.0–0.2)

## 2022-01-04 LAB — COMPREHENSIVE METABOLIC PANEL
ALT: 94 U/L — ABNORMAL HIGH (ref 0–44)
AST: 86 U/L — ABNORMAL HIGH (ref 15–41)
Albumin: 1.9 g/dL — ABNORMAL LOW (ref 3.5–5.0)
Alkaline Phosphatase: 214 U/L — ABNORMAL HIGH (ref 38–126)
Anion gap: 9 (ref 5–15)
BUN: 52 mg/dL — ABNORMAL HIGH (ref 8–23)
CO2: 20 mmol/L — ABNORMAL LOW (ref 22–32)
Calcium: 7.9 mg/dL — ABNORMAL LOW (ref 8.9–10.3)
Chloride: 107 mmol/L (ref 98–111)
Creatinine, Ser: 1.09 mg/dL — ABNORMAL HIGH (ref 0.44–1.00)
GFR, Estimated: 48 mL/min — ABNORMAL LOW (ref >60)
Glucose, Bld: 84 mg/dL (ref 70–99)
Potassium: 3 mmol/L — ABNORMAL LOW (ref 3.5–5.1)
Sodium: 136 mmol/L (ref 135–145)
Total Bilirubin: 1.2 mg/dL (ref 0.3–1.2)
Total Protein: 5 g/dL — ABNORMAL LOW (ref 6.5–8.1)

## 2022-01-04 LAB — URINALYSIS, ROUTINE W REFLEX MICROSCOPIC
Bilirubin Urine: NEGATIVE
Glucose, UA: NEGATIVE mg/dL
Hgb urine dipstick: NEGATIVE
Ketones, ur: NEGATIVE mg/dL
Nitrite: NEGATIVE
Protein, ur: 100 mg/dL — AB
Specific Gravity, Urine: 1.027 (ref 1.005–1.030)
pH: 5 (ref 5.0–8.0)

## 2022-01-04 LAB — LIPASE, BLOOD: Lipase: 23 U/L (ref 11–51)

## 2022-01-04 MED ORDER — CEPHALEXIN 250 MG/5ML PO SUSR: 500.0000 mg | Freq: Four times a day (QID) | ORAL | 0 refills | Status: AC

## 2022-01-04 MED ORDER — SODIUM CHLORIDE 0.9 % IV BOLUS
1000.0000 mL | Freq: Once | INTRAVENOUS | Status: AC
  Administered 2022-01-04: 1000 mL via INTRAVENOUS

## 2022-01-04 MED ORDER — POTASSIUM CHLORIDE 10 % PO SOLN: 40.0000 meq | Freq: Two times a day (BID) | ORAL | 0 refills | Status: AC

## 2022-01-04 MED ORDER — SODIUM CHLORIDE 0.9 % IV SOLN
2.0000 g | Freq: Once | INTRAVENOUS | Status: AC
  Administered 2022-01-04: 2 g via INTRAVENOUS
  Filled 2022-01-04: qty 20

## 2022-01-04 MED ORDER — POTASSIUM CHLORIDE 10 MEQ/100ML IV SOLN
10.0000 meq | Freq: Once | INTRAVENOUS | Status: AC
  Administered 2022-01-04: 10 meq via INTRAVENOUS
  Filled 2022-01-04: qty 100

## 2022-01-04 MED ORDER — LACTATED RINGERS IV BOLUS
1000.0000 mL | Freq: Once | INTRAVENOUS | Status: AC
  Administered 2022-01-04: 1000 mL via INTRAVENOUS

## 2022-01-04 NOTE — ED Provider Notes (Signed)
[facility] EMERGENCY DEPARTMENT Provider Note   CSN: [id number] Arrival date & time: 01/03/22  2225     History  Chief Complaint  Patient presents with   Failure To Thrive    Ashley Peters is a 88 y.o. female.  88 yo F here with her daughter for some shaking episodes. She states that twice in the last week she has episodes of almost like she had a fever. Not necessarily seizure like as she is alert during the episodes. 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 Decreased PO intake for quite awhile now. Not acting like herself right now. No fevers. No cough. Usually gets around with a walker but not not as well now.  EMS called for chest rotation and on their arrival she was tachycardic with a blood pressure in the 70 systolic.  Could not get a pulse ox so was started on oxygen.  DNR in place.         Home Medications Prior to Admission medications   Medication Sig Start Date End Date Taking? Authorizing Provider  cephALEXin (KEFLEX) 250 MG/5ML suspension Take 10 mLs (500 mg total) by mouth 4 (four) times daily for 5 days. 01/04/22 01/09/22 Yes Anwitha Mapes, Corene Cornea, MD  Potassium Chloride 10 % SOLN Take 40 mEq by mouth 2 (two) times daily for 7 days. 01/04/22 01-30-22 Yes Sherrell Farish, Corene Cornea, MD  acetaminophen (TYLENOL) 500 MG tablet Take 2 tablets (1,000 mg total) by mouth every 6 (six) hours as needed for moderate pain. Patient taking differently: Take 500-1,000 mg by mouth every 6 (six) hours as needed for moderate pain. 10/17/16   Donnie Mesa, MD  alendronate (FOSAMAX) 70 MG tablet Take with a full glass of water on an empty stomach. 05/06/21   Dettinger, [name], MD  Cyanocobalamin 1000 MCG/ML KIT Inject 1,000 mcg as directed every 30 (thirty) days.    [provider]  Multiple Vitamins-Minerals (PRESERVISION AREDS 2 PO) Take 1 capsule by mouth 2 (two) times daily.    [provider]  ondansetron (ZOFRAN) 8 MG tablet Take 1 tablet (8 mg  total) by mouth every 8 (eight) hours as needed for nausea or vomiting. 05/06/21   Dettinger, [name], MD  prochlorperazine (COMPAZINE) 5 MG tablet TAKE 1-2 TABLETS (5-10 MG TOTAL) BY MOUTH EVERY 6 (SIX) HOURS AS NEEDED FOR NAUSEA OR VOMITING. 12/11/21   Truitt Merle, MD  sodium chloride flush (NS) 0.9 % SOLN Flush abdominal drain with 5 mL once daily. 09/20/21         Allergies    Benicar [olmesartan medoxomil] and Amoxicillin    Review of Systems   Review of Systems  Physical Exam Updated Vital Signs BP 137/81   Pulse 92   Temp 97.6 F (36.4 C) (Oral)   Resp 15   Ht 5' (1.524 m)   Wt 40.2 kg   SpO2 98%   BMI 17.31 kg/m  Physical Exam Vitals and nursing note reviewed.  Constitutional:      Appearance: She is well-developed. She is ill-appearing (chronically).  HENT:     Head: Normocephalic and atraumatic.     Mouth/Throat:     Mouth: Mucous membranes are dry.     Pharynx: Oropharynx is clear.  Eyes:     Pupils: Pupils are equal, round, and reactive to light.  Cardiovascular:     Rate and Rhythm: Tachycardia present. Rhythm irregular.  Pulmonary:     Effort: No respiratory distress.  Breath sounds: No stridor.  Abdominal:     General: Abdomen is flat. There is no distension.     Tenderness: There is no abdominal tenderness.     Comments: Perc drain in RUQ c/d/I and may be slightly withdrawn (less than 1 cm) with out evidence of infection or significant leak  Musculoskeletal:     Cervical back: Normal range of motion.  Skin:    General: Skin is warm and dry.     Findings: No erythema.     Comments: Has a lidocaine patch in place over lower thoracic spine without underlying ulcer, infection or deformity  Neurological:     General: No focal deficit present.     Mental Status: She is alert.     ED Results / Procedures / Treatments   Labs (all labs ordered are listed, but only abnormal results are displayed) Labs Reviewed  CBC WITH DIFFERENTIAL/PLATELET - Abnormal;  Notable for the following components:      Result Value   WBC 26.1 (*)    Hemoglobin 11.8 (*)    HCT 35.8 (*)    Platelets 97 (*)    Neutro Abs 24.6 (*)    Lymphs Abs 0.1 (*)    Abs Immature Granulocytes 0.76 (*)    All other components within normal limits  COMPREHENSIVE METABOLIC PANEL - Abnormal; Notable for the following components:   Potassium 3.0 (*)    CO2 20 (*)    BUN 52 (*)    Creatinine, Ser 1.09 (*)    Calcium 7.9 (*)    Total Protein 5.0 (*)    Albumin 1.9 (*)    AST 86 (*)    ALT 94 (*)    Alkaline Phosphatase 214 (*)    GFR, Estimated 48 (*)    All other components within normal limits  URINALYSIS, ROUTINE W REFLEX MICROSCOPIC - Abnormal; Notable for the following components:   Color, Urine AMBER (*)    APPearance CLOUDY (*)    Protein, ur 100 (*)    Leukocytes,Ua SMALL (*)    Bacteria, UA RARE (*)    Non Squamous Epithelial 0-5 (*)    All other components within normal limits  URINE CULTURE  LIPASE, BLOOD    EKG EKG Interpretation  Date/Time:  Friday January 03 2022 22:38:57 EDT Ventricular Rate:  161 PR Interval:    QRS Duration: 70 QT Interval:  295 QTC Calculation: 501 R Axis:   36 Text Interpretation: Atrial fibrillation with rapid V-rate Ventricular premature complex Low voltage, extremity and precordial leads Probable anteroseptal infarct, old Baseline wander in lead(s) I III aVL V1 Confirmed by Merrily Pew [phone]) on 01/04/2022 12:04:09 AM  Radiology DG Chest 2 View  Result Date: 01/03/2022 CLINICAL DATA:  Deteriorating condition and lethargy. EXAM: CHEST - 2 VIEW COMPARISON:  09/19/2020. FINDINGS: The heart size and mediastinal contours are within normal limits. No consolidation, effusion, or pneumothorax. Degenerative changes are present in the thoracic spine. Surgical clips are present in the right upper quadrant. IMPRESSION: No active cardiopulmonary disease. Electronically Signed   By: Brett Fairy M.D.   On: 01/03/2022 23:59   CT  Head Wo Contrast  Result Date: 01/03/2022 CLINICAL DATA:  Lethargy, deteriorating condition. Dizziness, persistent/recurrent, cardiac or vascular cause suspected. EXAM: CT HEAD WITHOUT CONTRAST TECHNIQUE: Contiguous axial images were obtained from the base of the skull through the vertex without intravenous contrast. RADIATION DOSE REDUCTION: This exam was performed according to the departmental dose-optimization program which includes automated exposure control,  adjustment of the mA and/or kV according to patient size and/or use of iterative reconstruction technique. COMPARISON:  None Available. FINDINGS: Brain: No acute intracranial hemorrhage, midline shift or mass effect. No extra-axial fluid collection. Diffuse atrophy is noted. Periventricular white matter hypodensities are present bilaterally. No hydrocephalus. Vascular: No hyperdense vessel or unexpected calcification. Skull: Normal. Negative for fracture or focal lesion. Sinuses/Orbits: No acute finding. Other: None. IMPRESSION: 1. No acute intracranial process. 2. Atrophy with chronic microvascular ischemic changes. Electronically Signed   By: Brett Fairy M.D.   On: 01/03/2022 23:48    Procedures .Critical Care  Performed by: Merrily Pew, MD Authorized by: Merrily Pew, MD   Critical care provider statement:    Critical care time (minutes):  30   Critical care was necessary to treat or prevent imminent or life-threatening deterioration of the following conditions:  Dehydration and circulatory failure   Critical care was time spent personally by me on the following activities:  Development of treatment plan with patient or surrogate, discussions with consultants, evaluation of patient's response to treatment, examination of patient, ordering and review of laboratory studies, ordering and review of radiographic studies, ordering and performing treatments and interventions, pulse oximetry, re-evaluation of patient's condition and review of  old charts   Medications Ordered in ED Medications  potassium chloride 10 mEq in 100 mL IVPB (10 mEq Intravenous New Bag/Given 01/04/22 0430)  lactated ringers bolus 1,000 mL (0 mLs Intravenous Stopped 01/04/22 0140)  lactated ringers bolus 1,000 mL (0 mLs Intravenous Stopped 01/04/22 0250)  cefTRIAXone (ROCEPHIN) 2 g in sodium chloride 0.9 % 100 mL IVPB (0 g Intravenous Stopped 01/04/22 0321)  sodium chloride 0.9 % bolus 1,000 mL (1,000 mLs Intravenous New Bag/Given 01/04/22 0324)  potassium chloride 10 mEq in 100 mL IVPB (0 mEq Intravenous Stopped 01/04/22 0424)    ED Course/ Medical Decision Making/ A&P                           Medical Decision Making Amount and/or Complexity of Data Reviewed Labs: ordered. Radiology: ordered.  Risk Prescription drug management.   Initially patient with A-fib RVR and soft pressures.  However no history of A-fib and it was thought that maybe the fast heart rate and the A-fib was related to dehydration so elected to not cardioverted she would also have been a poor candidate for sedation.  Thought this was likely more metabolic related.  Daughter stated the patient was DNR but would want treatment up until that point if it was deemed to be safe.  Initial thought was that the patient would likely need to be admitted however after some more fluids patient started to feel better and appeared better.  Her vital signs improved significantly but she was persistently normotensive with heart rates in the 90-100 range.  She is not hypoxic or have any tachypnea.  She had pretty significantly elevated BUN but her creatinine was not that much elevated and the ratio would suggest hypovolemia as the cause.  Potassium is slightly low which is not new and she was repleted here in the ER.  She was found to have UTI on urinalysis.  Her head CT and chest x-ray were both reviewed and personally interpreted by myself without any evidence of significant etiology.  Patient  tolerating p.o.  After shared decision making with the daughter it was decided the patient was pelvis safe to be discharged on oral potassium and antibiotics with close PCP follow-up  and return here for new or worsening symptoms.  I did discuss having a case manager discussed with her about continuing palliative care possibly considering hospice versus just having an aide or nurse that can come to the house once a week or couple times a week to help out with hydration and medication management as needed.  She is receptive to speaking with the case manager but was not sure if she would need any other help at this time.  Patient was discharged to the care of her daughter in stable condition.  Final Clinical Impression(s) / ED Diagnoses Final diagnoses:  Acute cystitis without hematuria  Dehydration  Hypokalemia    Rx / DC Orders ED Discharge Orders          Ordered    Potassium Chloride 10 % SOLN  2 times daily        01/04/22 0447    cephALEXin (KEFLEX) 250 MG/5ML suspension  4 times daily        01/04/22 0447              Chaunda Vandergriff, Corene Cornea, MD 01/04/22 [phone]

## 2022-01-04 NOTE — Telephone Encounter (Signed)
NO Answer at [place], [place] On call for [facility] to sort out serving this patient in [place]. Ashley Peters will place her on the [facility] list to speak to daughter regarding servicing , since they are still active will hold off on hospice at [facility] for now

## 2022-01-04 NOTE — Telephone Encounter (Signed)
Called [facility] for referral

## 2022-01-04 NOTE — Telephone Encounter (Signed)
Spoke to patients daughter Ms. Nicole Kindred. She states [facility] said they are no longer servicing [facility] starting next month. They are going to come to see her this week for palliative. Mrs. Nicole Kindred approved for me to call and consult [facility]. To have them call her regarding palliative/hospice outpatient. The patient is staying with her daughter, as she normally lives alone.

## 2022-01-04 NOTE — ED Notes (Signed)
DNR bracelet placed on right arm - confirmed by daughter @ [facility].

## 2022-01-06 ENCOUNTER — Telehealth: Payer: Self-pay | Admitting: Family Medicine

## 2022-01-06 LAB — URINE CULTURE: Culture: >=100000 — AB

## 2022-01-06 NOTE — Telephone Encounter (Signed)
Patients daughter calling to talk to nurse about medications that she was given in the emergency room. Did not want to make an appointment for ER follow up at this time. Please call back.

## 2022-01-06 NOTE — Telephone Encounter (Signed)
Called and spoke with daughter Martin Majestic to [facility] 10/27. Does not want to come in for appt. Has diarrhea from antibiotic advise they could give her imodium for that. They verbalized understanding. They just want you to review hospital note so that your aware.

## 2022-01-06 NOTE — Telephone Encounter (Signed)
Yes antibiotics can cause diarrhea and I do agree that they could use Imodium for that and that should help.  I do see the hospital note and that she was also low on potassium and that would probably be something that we would want to recheck, I would recommend to either recheck through [place] or they can recheck through home health if they have it.

## 2022-01-06 NOTE — Telephone Encounter (Signed)
They put her on k+ patient daughter states they are getting her transferred to hospice they think talking with someone about it this week FYI

## 2022-01-07 ENCOUNTER — Encounter: Payer: Self-pay | Admitting: Hematology

## 2022-01-07 ENCOUNTER — Telehealth (HOSPITAL_BASED_OUTPATIENT_CLINIC_OR_DEPARTMENT_OTHER): Payer: Self-pay

## 2022-01-07 NOTE — Telephone Encounter (Signed)
Post ED Visit - Positive Culture Follow-up  Culture report reviewed by antimicrobial stewardship pharmacist: [facility] Team '[x]'$  Titus Dubin, Pharm.D. '[]'$  Heide Guile, Pharm.D., BCPS AQ-ID '[]'$  Parks Neptune, Pharm.D., BCPS '[]'$  Alycia Rossetti, Pharm.D., BCPS '[]'$  Waterproof, Pharm.D., BCPS, AAHIVP '[]'$  Legrand Como, Pharm.D., BCPS, AAHIVP '[]'$  Salome Arnt, PharmD, BCPS '[]'$  Johnnette Gourd, PharmD, BCPS '[]'$  Hughes Better, PharmD, BCPS '[]'$  Leeroy Cha, PharmD '[]'$  Laqueta Linden, PharmD, BCPS '[]'$  Albertina Parr, PharmD  [place] Team '[]'$  Leodis Sias, PharmD '[]'$  Lindell Spar, PharmD '[]'$  Royetta Asal, PharmD '[]'$  Graylin Shiver, Rph '[]'$  Rema Fendt) Glennon Mac, PharmD '[]'$  Arlyn Dunning, PharmD '[]'$  Netta Cedars, PharmD '[]'$  Dia Sitter, PharmD '[]'$  Leone Haven, PharmD '[]'$  Gretta Arab, PharmD '[]'$  Theodis Shove, PharmD '[]'$  Peggyann Juba, PharmD '[]'$  Reuel Boom, PharmD   Positive urine culture Treated with Cephalexin, organism sensitive to the same and no further patient follow-up is required at this time.  Glennon Hamilton 01/07/2022, 2:59 PM

## 2022-01-08 ENCOUNTER — Other Ambulatory Visit: Payer: Self-pay

## 2022-01-08 ENCOUNTER — Other Ambulatory Visit: Payer: Self-pay | Admitting: Nurse Practitioner

## 2022-01-08 ENCOUNTER — Other Ambulatory Visit: Payer: Medicare Other | Admitting: Family Medicine

## 2022-01-08 VITALS — BP 122/72 | HR 92 | Resp 11

## 2022-01-08 DIAGNOSIS — M1990 Unspecified osteoarthritis, unspecified site: Secondary | ICD-10-CM | POA: Diagnosis not present

## 2022-01-08 DIAGNOSIS — I10 Essential (primary) hypertension: Secondary | ICD-10-CM | POA: Diagnosis not present

## 2022-01-08 DIAGNOSIS — N3001 Acute cystitis with hematuria: Secondary | ICD-10-CM | POA: Diagnosis not present

## 2022-01-08 DIAGNOSIS — K831 Obstruction of bile duct: Secondary | ICD-10-CM | POA: Diagnosis not present

## 2022-01-08 DIAGNOSIS — C182 Malignant neoplasm of ascending colon: Secondary | ICD-10-CM

## 2022-01-08 DIAGNOSIS — K219 Gastro-esophageal reflux disease without esophagitis: Secondary | ICD-10-CM | POA: Diagnosis not present

## 2022-01-08 DIAGNOSIS — Z515 Encounter for palliative care: Secondary | ICD-10-CM

## 2022-01-08 DIAGNOSIS — C240 Malignant neoplasm of extrahepatic bile duct: Secondary | ICD-10-CM

## 2022-01-08 DIAGNOSIS — C221 Intrahepatic bile duct carcinoma: Secondary | ICD-10-CM | POA: Diagnosis not present

## 2022-01-08 DIAGNOSIS — E43 Unspecified severe protein-calorie malnutrition: Secondary | ICD-10-CM | POA: Diagnosis not present

## 2022-01-08 NOTE — Progress Notes (Unsigned)
[facility] Consult Note Telephone: [phone]  Fax: [phone]    Date of encounter: 01/08/22 2:42 PM PATIENT NAME: Ashley Peters [place] [id number]   [phone] (home)  DOB: [birth year] MRN: [id number] PRIMARY CARE PROVIDER:    Dettinger, [name], MD,  [place] [address] [phone]  REFERRING PROVIDER:   Dettinger, [name], MD [address] [place],  [place] [address] [phone]  RESPONSIBLE PARTY:    Contact Information     Name Relation Home Work [place] Daughter [phone]  [phone]   Porchia, [place]   [phone]        I met face to face with patient and family in daughter's  home. Ashley Peters and her husband Abbe [place] and brother Salome Spotted were present. Palliative Care was asked to follow this patient by consultation request of  Dettinger, [name], MD to address advance care planning and complex medical decision making. This is a follow up visit   ASSESSMENT , SYMPTOM MANAGEMENT AND PLAN / RECOMMENDATIONS:   UTI Completing course of Cephalexin Recent sepsis with some improvement in ED, daughter and family given option to admit or take home.  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  Cachexia and severe weight loss, not eating and poor fluid intake. Due for percutaneous drain exchange and advised family to defer. Urgent referral to [place] Planning/Goals of Care: Goals include symptom management at home. Health care surrogate gave /her permission to discuss.  Experiences with loved ones who have been seriously ill or have died -pt's son-in-law was transferred from hospital to a hospice home and pt died 3 hours after arrival.  He believed she should have stayed in the hospital as transport took long hours. Exploration of personal, cultural or spiritual beliefs that might influence medical decisions-Family wants  pt to pass at home, knowing "she is going to a better place" Exploration of goals of care in the event of a sudden injury or illness-want comfort care at their home Identification of a healthcare agent -daughter Ashley Peters Review of an advance directive document-has DNR . Decision not to resuscitate or to de-escalate disease focused treatments due to poor prognosis. CODE STATUS: DNR/DNI     Follow up Palliative Care Visit: None, referral to Hospice.    This visit was coded based on medical decision making (MDM).  PPS: 10%  HOSPICE ELIGIBILITY/DIAGNOSIS: TBD  Chief Complaint:  Acute visit requested by daughter for worsening condition with weakness, lethargy and not eating.  Currently being treated for a UTI.  HISTORY OF PRESENT ILLNESS:  Ashley Peters is a 88 y.o. year old female with colon cancer and hilar cholangiocarcinoma with HTN, GERD, osteoporosis, HLD, iron deficiency anemia and B12 deficiency, nausea, cachexia, weight loss, and macular degeneration. She was taken to the ER at [facility] on Friday with rigors, suspected fever and noted to have UTI, was hypotensive with SBP in 70s, WBC 26.1 with left shift.  CMP noted hypokalemia of 3, transaminitis AST 86, ALT 94, Albumin 1.9 and elevated alkaline phosphatase 214.  Her CR had more than doubled with baselin Cr 0.7, Cr Friday was 1.09, BUN 52 and eGFR 48.  Noted hypocalcemia of 7.9.  Ua was indicative of UTI and pt was treated with IV antibiotics and fluid resuscitation in ED.  BP improved to 924M Systolic and family was in agreement with significant improvement to take  pt home on oral Cephalhexin.  Daughter states she had been eating minimally but only had "bites" yesterday.  They were able to 2 person assist her to the bathroom this am and get her in a recliner where she has remained and been unresponsive verbally.  At times she will open her eyes but does not appear to be tracking whomever is talking with her.  She has not had a BM in  several days and prior to that she was having some loose stools.  Daughter states that she has not been confused or non-verbal until today and that she seems to have pain when being moved.  She will be due in 2 days to have her percutaneous biliary drain in RUQ changed.  Family is wanting admission to hospice and had contacted Dr Annamaria Boots for orders this am to [facility] as she misunderstood that although Palliative would not be in person in [facility] that there would also be no Hospice coverage.  She and family are in agreement to stay with [facility] unless [facility] can get them in quicker.  They want pt to remain at home and comfortable but are aware if she needs frequent medication adjustments that cannot be addressed at home they still have the option of looking at inpatient Hospice.   History obtained from review of EMR, discussion with primary team, and interview with family, facility staff/caregiver and/or Ashley Peters.      Latest Ref Rng & Units 01/03/2022   11:53 PM 11/28/2021    3:09 PM 10/25/2021    8:09 AM  CBC  WBC 4.0 - 10.5 K/uL 26.1  8.8  7.9   Hemoglobin 12.0 - 15.0 g/dL 11.8  13.9  14.8   Hematocrit 36.0 - 46.0 % 35.8  41.0  43.8   Platelets 150 - 400 K/uL 97  522  178        Latest Ref Rng & Units 01/03/2022   11:53 PM 11/28/2021    3:09 PM 10/25/2021    8:10 AM  CMP  Glucose 70 - 99 mg/dL 84  122  128   BUN 8 - 23 mg/dL 52  18  25   Creatinine 0.44 - 1.00 mg/dL 1.09  0.69  0.83   Sodium 135 - 145 mmol/L 136  136  136   Potassium 3.5 - 5.1 mmol/L 3.0  5.1  5.3   Chloride 98 - 111 mmol/L 107  101  104   CO2 22 - 32 mmol/L _0 Calcium 8.9 - 10.3 mg/dL 7.9  9.3  9.9   Total Protein 6.5 - 8.1 g/dL 5.0  6.1  6.5   Total Bilirubin 0.3 - 1.2 mg/dL 1.2  0.4  1.1   Alkaline Phos 38 - 126 U/L 214  385  262   AST 15 - 41 U/L 86  23  41   ALT 0 - 44 U/L 94  16  33     I reviewed EMR for available labs, medications, imaging,  studies and related documents.  There are no new records since last visit/Records reviewed and summarized above.   ROS Pt unresponsive  Physical Exam: Current and past weights: 88 lbs 10 oz on 01/03/22 Constitutional: NAD General: frail appearing, cachectic CV: S1S2, IRIR, trace pedal edema Pulmonary: CTAB, no increased work of breathing, no cough, 5-7 second periods of apnea Abdomen: absent BS + 4 quadrants, soft and non tender, percutaneous drain in right upper  abdomen, draining a small amount of yellow drainage on dressing MSK: noted generalized sarcopenia, in a chair but cannot ambulate on her own Skin cool and pale Neuro:  no generalized weakness,  noted lethargy, arousable to open eyes but does not track or verbally respond Psych: non-anxious affect, lethargic Hem/lymph/immuno: no widespread bruising   Thank you for the opportunity to participate in the care of Ashley Peters.  The palliative care team will continue to follow. Please call our office at [phone] if we can be of additional assistance.   Marijo Conception, FNP -C  COVID-19 PATIENT SCREENING TOOL Asked and negative response unless otherwise noted:   Have you had symptoms of covid, tested positive or been in contact with someone with symptoms/positive test in the past 5-10 days?  No

## 2022-01-08 NOTE — Telephone Encounter (Signed)
Okay that sounds reasonable, just keep [place] in the loop.  As things progress

## 2022-01-09 ENCOUNTER — Other Ambulatory Visit: Payer: Medicare Other | Admitting: Family Medicine

## 2022-01-09 ENCOUNTER — Encounter: Payer: Self-pay | Admitting: Family Medicine

## 2022-01-09 DIAGNOSIS — E43 Unspecified severe protein-calorie malnutrition: Secondary | ICD-10-CM | POA: Diagnosis not present

## 2022-01-09 DIAGNOSIS — K831 Obstruction of bile duct: Secondary | ICD-10-CM | POA: Diagnosis not present

## 2022-01-09 DIAGNOSIS — I10 Essential (primary) hypertension: Secondary | ICD-10-CM | POA: Diagnosis not present

## 2022-01-09 DIAGNOSIS — M1990 Unspecified osteoarthritis, unspecified site: Secondary | ICD-10-CM | POA: Diagnosis not present

## 2022-01-09 DIAGNOSIS — N3001 Acute cystitis with hematuria: Secondary | ICD-10-CM | POA: Insufficient documentation

## 2022-01-09 DIAGNOSIS — C221 Intrahepatic bile duct carcinoma: Secondary | ICD-10-CM | POA: Diagnosis not present

## 2022-01-09 DIAGNOSIS — K219 Gastro-esophageal reflux disease without esophagitis: Secondary | ICD-10-CM | POA: Diagnosis not present

## 2022-01-10 ENCOUNTER — Other Ambulatory Visit (HOSPITAL_COMMUNITY): Payer: Medicare Other

## 2022-01-10 DIAGNOSIS — K219 Gastro-esophageal reflux disease without esophagitis: Secondary | ICD-10-CM | POA: Diagnosis not present

## 2022-01-10 DIAGNOSIS — M1990 Unspecified osteoarthritis, unspecified site: Secondary | ICD-10-CM | POA: Diagnosis not present

## 2022-01-10 DIAGNOSIS — K831 Obstruction of bile duct: Secondary | ICD-10-CM | POA: Diagnosis not present

## 2022-01-10 DIAGNOSIS — C221 Intrahepatic bile duct carcinoma: Secondary | ICD-10-CM | POA: Diagnosis not present

## 2022-01-10 DIAGNOSIS — E43 Unspecified severe protein-calorie malnutrition: Secondary | ICD-10-CM | POA: Diagnosis not present

## 2022-01-10 DIAGNOSIS — I10 Essential (primary) hypertension: Secondary | ICD-10-CM | POA: Diagnosis not present

## 2022-01-11 DIAGNOSIS — C221 Intrahepatic bile duct carcinoma: Secondary | ICD-10-CM | POA: Diagnosis not present

## 2022-01-11 DIAGNOSIS — I10 Essential (primary) hypertension: Secondary | ICD-10-CM | POA: Diagnosis not present

## 2022-01-11 DIAGNOSIS — K219 Gastro-esophageal reflux disease without esophagitis: Secondary | ICD-10-CM | POA: Diagnosis not present

## 2022-01-11 DIAGNOSIS — M1990 Unspecified osteoarthritis, unspecified site: Secondary | ICD-10-CM | POA: Diagnosis not present

## 2022-01-11 DIAGNOSIS — E43 Unspecified severe protein-calorie malnutrition: Secondary | ICD-10-CM | POA: Diagnosis not present

## 2022-01-11 DIAGNOSIS — K831 Obstruction of bile duct: Secondary | ICD-10-CM | POA: Diagnosis not present

## 2022-01-20 ENCOUNTER — Ambulatory Visit: Payer: Medicare Other | Admitting: Hematology

## 2022-01-20 ENCOUNTER — Ambulatory Visit: Payer: Medicare Other

## 2022-01-20 ENCOUNTER — Other Ambulatory Visit: Payer: Medicare Other

## 2022-01-22 ENCOUNTER — Ambulatory Visit: Payer: Medicare Other

## 2022-02-07 DEATH — deceased

## 2022-05-30 ENCOUNTER — Ambulatory Visit: Payer: Medicare Other | Admitting: Family Medicine
